# Patient Record
Sex: Male | Born: 1950 | Race: Black or African American | Hispanic: No | Marital: Married | Smoking: Former smoker
Health system: Southern US, Community
[De-identification: ages and names within clinical notes are randomized; demographics above are authoritative.]

## PROBLEM LIST (undated history)

## (undated) ENCOUNTER — Emergency Department (HOSPITAL_COMMUNITY): Payer: Medicare Other

## (undated) DIAGNOSIS — E7800 Pure hypercholesterolemia, unspecified: Secondary | ICD-10-CM

## (undated) DIAGNOSIS — I639 Cerebral infarction, unspecified: Secondary | ICD-10-CM

## (undated) DIAGNOSIS — I5020 Unspecified systolic (congestive) heart failure: Secondary | ICD-10-CM

## (undated) DIAGNOSIS — D649 Anemia, unspecified: Secondary | ICD-10-CM

## (undated) DIAGNOSIS — G4733 Obstructive sleep apnea (adult) (pediatric): Secondary | ICD-10-CM

## (undated) DIAGNOSIS — N186 End stage renal disease: Secondary | ICD-10-CM

## (undated) DIAGNOSIS — Z9989 Dependence on other enabling machines and devices: Secondary | ICD-10-CM

## (undated) DIAGNOSIS — I10 Essential (primary) hypertension: Secondary | ICD-10-CM

## (undated) DIAGNOSIS — T07XXXA Unspecified multiple injuries, initial encounter: Secondary | ICD-10-CM

## (undated) DIAGNOSIS — D352 Benign neoplasm of pituitary gland: Secondary | ICD-10-CM

## (undated) DIAGNOSIS — E119 Type 2 diabetes mellitus without complications: Secondary | ICD-10-CM

## (undated) HISTORY — PX: COLONOSCOPY: SHX174

---

## 2000-03-24 ENCOUNTER — Encounter: Admission: RE | Admit: 2000-03-24 | Discharge: 2000-06-22 | Payer: Self-pay | Admitting: Internal Medicine

## 2002-06-02 ENCOUNTER — Ambulatory Visit (HOSPITAL_COMMUNITY): Admission: RE | Admit: 2002-06-02 | Discharge: 2002-06-02 | Payer: Self-pay | Admitting: Gastroenterology

## 2002-06-02 ENCOUNTER — Encounter (INDEPENDENT_AMBULATORY_CARE_PROVIDER_SITE_OTHER): Payer: Self-pay | Admitting: *Deleted

## 2003-05-21 ENCOUNTER — Ambulatory Visit (HOSPITAL_BASED_OUTPATIENT_CLINIC_OR_DEPARTMENT_OTHER): Admission: RE | Admit: 2003-05-21 | Discharge: 2003-05-21 | Payer: Self-pay | Admitting: Nephrology

## 2010-04-12 ENCOUNTER — Encounter: Admission: RE | Admit: 2010-04-12 | Discharge: 2010-04-12 | Payer: Self-pay | Admitting: Internal Medicine

## 2010-11-02 NOTE — Op Note (Signed)
   NAMEJADIEL, Blake Bell                       ACCOUNT NO.:  [id number]   MEDICAL RECORD NO.:  [id number]                   PATIENT TYPE:  AMB   LOCATION:  ENDO                                 FACILITY:  [facility]   PHYSICIAN:  Anselmo Rod, M.D.               DATE OF BIRTH:  1950-07-14   DATE OF PROCEDURE:  06/02/2002  DATE OF DISCHARGE:                                 OPERATIVE REPORT   PROCEDURE:  Esophagogastroduodenoscopy with biopsies.   ENDOSCOPIST:  Anselmo Rod, M.D.   INSTRUMENT USED:  Olympus video panendoscope.   INDICATION FOR PROCEDURE:  A 60 year old African-American male with a  history of iron-deficiency anemia and hemoglobin down to 10.6 g/dl.  Rule  out peptic ulcer disease, esophagitis, gastritis, etc.   PREPROCEDURE PREPARATION:  Informed consent was procured from the patient.  The patient was fasted for eight hours prior to the procedure.   PREPROCEDURE PHYSICAL:  VITAL SIGNS:  The patient had stable vital signs.  NECK:  Supple.  CHEST:  Clear to auscultation.  S1, S2 regular.  ABDOMEN:  Soft with normal bowel sounds.   DESCRIPTION OF PROCEDURE:  The patient was placed in the left lateral  decubitus position and sedated with 80 mg of Demerol and 8 mg of Versed  intravenously.  Once the patient was adequately sedate and maintained on low-  flow oxygen and continuous cardiac monitoring, the Olympus video  panendoscope was advanced through the mouthpiece, over the tongue, into the  esophagus under direct vision.  The entire esophagus appeared normal with no  evidence of ring, stricture, masses, esophagitis, or Barrett's mucosa.  The  scope was then advanced into the stomach.  A prominent fold was seen in the  antrum and was biopsied for pathology.  Two biopsies were done and the area  bled easily, and therefore no further biopsies were taken.  The rest of the  gastric mucosa seemed healthy and without lesions.  The proximal small bowel  appeared normal  as well.   IMPRESSION:  1. A prominent fold in the antrum, biopsied for pathology.  2. Otherwise normal-appearing esophagus and proximal small bowel.    RECOMMENDATIONS:  1. Await pathology results.  2. Avoid all nonsteroidals, including aspirin, for now.  3. Proceed with a colonoscopy at this time.                                               Anselmo Rod, M.D.    JNM/MEDQ  D:  06/02/2002  T:  06/03/2002  Job:  [id number]   cc:   Merlene Laughter. Renae Gloss, M.D.  [phone]  [place]  [place] [address]  Fax: [phone]

## 2010-11-02 NOTE — Op Note (Signed)
NAMEBRYTEN, Blake Bell                       ACCOUNT NO.:  [id number]   MEDICAL RECORD NO.:  [id number]                   PATIENT TYPE:  AMB   LOCATION:  ENDO                                 FACILITY:  [facility]   PHYSICIAN:  Anselmo Rod, M.D.               DATE OF BIRTH:  09/07/1950   DATE OF PROCEDURE:  06/03/2002  DATE OF DISCHARGE:                                 OPERATIVE REPORT   PROCEDURE:  Colonoscopy with biopsies.   ENDOSCOPIST:  Anselmo Rod, M.D.   INSTRUMENT USED:  Olympus video colonoscope.   INDICATION FOR PROCEDURE:  A 60 year old African-American male with a  history of iron-deficiency anemia.  Rule out colonic polyps, masses, etc.   PREPROCEDURE PREPARATION:  Informed consent was procured from the patient.  The patient was fasted for eight hours prior to the procedure and prepped  with a bottle of magnesium citrate and a gallon of NuLytely the night prior  to the procedure.   PREPROCEDURE PHYSICAL:  VITAL SIGNS:  The patient had stable vital signs.  NECK:  Supple.  CHEST:  Clear to auscultation.  S1, S2 regular.  ABDOMEN:  Soft with normal bowel sounds.   DESCRIPTION OF PROCEDURE:  The patient was placed in the left lateral  decubitus position and sedated with Demerol and Versed for the EGD.  No  additional sedation was used for the colonoscopy.  Once the patient was  adequately sedate and maintained on low-flow oxygen and continuous cardiac  monitoring, the Olympus video colonoscope was advanced from the rectum to  the cecum and terminal ileum with difficulty.  There was some residual stool  in the right colon.  Multiple washes were done.  Patchy erosions were seen  in the cecum.  These were biopsied for pathology.  Similar erosions were  also noted in the proximal right colon.  Biopsies were done to rule out IBD.  The rest of the colonic mucosa appeared healthy and without lesions.  Retroflexion revealed no abnormalities.  The patient tolerated the  procedure  well without complications.   IMPRESSION:  Patchy erosions in cecum and proximal right colon, biopsied to  rule out inflammatory bowel disease; otherwise normal colonoscopy, including  the terminal ileum.   RECOMMENDATIONS:  1. Await pathology results.  2.     Avoid all nonsteroidals, including aspirin, for now.  3. Check CBC today.  4. Outpatient follow-up in the next two weeks for further recommendations.                                               Anselmo Rod, M.D.    JNM/MEDQ  D:  06/03/2002  T:  06/04/2002  Job:  [id number]   cc:   Merlene Laughter. Renae Gloss, M.D.  [phone] [address]  [place]  [place] [address]  Fax: [phone]

## 2012-02-28 ENCOUNTER — Other Ambulatory Visit: Payer: Self-pay | Admitting: Gastroenterology

## 2012-03-02 ENCOUNTER — Ambulatory Visit (HOSPITAL_COMMUNITY)
Admission: RE | Admit: 2012-03-02 | Payer: BC Managed Care – PPO | Source: Ambulatory Visit | Admitting: Gastroenterology

## 2012-03-02 ENCOUNTER — Encounter (HOSPITAL_COMMUNITY): Admission: RE | Payer: Self-pay | Source: Ambulatory Visit

## 2012-03-02 SURGERY — COLONOSCOPY
Anesthesia: Moderate Sedation

## 2013-01-26 ENCOUNTER — Other Ambulatory Visit: Payer: Self-pay | Admitting: Gastroenterology

## 2013-03-10 ENCOUNTER — Encounter (HOSPITAL_COMMUNITY): Payer: Self-pay | Admitting: *Deleted

## 2013-03-17 ENCOUNTER — Encounter (HOSPITAL_COMMUNITY): Payer: Self-pay | Admitting: Pharmacy Technician

## 2013-04-05 ENCOUNTER — Encounter (HOSPITAL_COMMUNITY): Payer: Self-pay | Admitting: *Deleted

## 2013-04-05 ENCOUNTER — Encounter (HOSPITAL_COMMUNITY)
Admission: RE | Disposition: A | Discharge status: Home / Self Care | Payer: Self-pay | Source: Ambulatory Visit | Attending: Gastroenterology

## 2013-04-05 ENCOUNTER — Encounter (HOSPITAL_COMMUNITY): Payer: BC Managed Care – PPO | Admitting: Anesthesiology

## 2013-04-05 ENCOUNTER — Ambulatory Visit (HOSPITAL_COMMUNITY)
Admission: RE | Admit: 2013-04-05 | Discharge: 2013-04-05 | Disposition: A | Discharge status: Home / Self Care | Payer: BC Managed Care – PPO | Source: Ambulatory Visit | Attending: Gastroenterology | Admitting: Gastroenterology

## 2013-04-05 ENCOUNTER — Ambulatory Visit (HOSPITAL_COMMUNITY): Payer: BC Managed Care – PPO | Admitting: Anesthesiology

## 2013-04-05 DIAGNOSIS — K648 Other hemorrhoids: Secondary | ICD-10-CM | POA: Insufficient documentation

## 2013-04-05 DIAGNOSIS — G4730 Sleep apnea, unspecified: Secondary | ICD-10-CM | POA: Insufficient documentation

## 2013-04-05 DIAGNOSIS — R0602 Shortness of breath: Secondary | ICD-10-CM | POA: Insufficient documentation

## 2013-04-05 DIAGNOSIS — L299 Pruritus, unspecified: Secondary | ICD-10-CM | POA: Insufficient documentation

## 2013-04-05 DIAGNOSIS — K5730 Diverticulosis of large intestine without perforation or abscess without bleeding: Secondary | ICD-10-CM | POA: Insufficient documentation

## 2013-04-05 DIAGNOSIS — I129 Hypertensive chronic kidney disease with stage 1 through stage 4 chronic kidney disease, or unspecified chronic kidney disease: Secondary | ICD-10-CM | POA: Insufficient documentation

## 2013-04-05 DIAGNOSIS — R21 Rash and other nonspecific skin eruption: Secondary | ICD-10-CM | POA: Insufficient documentation

## 2013-04-05 DIAGNOSIS — M2550 Pain in unspecified joint: Secondary | ICD-10-CM | POA: Insufficient documentation

## 2013-04-05 DIAGNOSIS — N189 Chronic kidney disease, unspecified: Secondary | ICD-10-CM | POA: Insufficient documentation

## 2013-04-05 DIAGNOSIS — Z1211 Encounter for screening for malignant neoplasm of colon: Secondary | ICD-10-CM | POA: Insufficient documentation

## 2013-04-05 DIAGNOSIS — K6289 Other specified diseases of anus and rectum: Secondary | ICD-10-CM | POA: Insufficient documentation

## 2013-04-05 DIAGNOSIS — E119 Type 2 diabetes mellitus without complications: Secondary | ICD-10-CM | POA: Insufficient documentation

## 2013-04-05 DIAGNOSIS — K6389 Other specified diseases of intestine: Secondary | ICD-10-CM | POA: Insufficient documentation

## 2013-04-05 HISTORY — PX: COLONOSCOPY: SHX5424

## 2013-04-05 HISTORY — DX: Essential (primary) hypertension: I10

## 2013-04-05 LAB — GLUCOSE, CAPILLARY: Glucose-Capillary: 217 mg/dL — ABNORMAL HIGH (ref 70–99)

## 2013-04-05 SURGERY — COLONOSCOPY
Anesthesia: Monitor Anesthesia Care

## 2013-04-05 MED ORDER — LACTATED RINGERS IV SOLN
INTRAVENOUS | Status: DC | PRN
  Administered 2013-04-05: 12:00:00 via INTRAVENOUS

## 2013-04-05 MED ORDER — KETAMINE HCL 50 MG/ML IJ SOLN
INTRAMUSCULAR | Status: DC | PRN
  Administered 2013-04-05: 25 mg via INTRAMUSCULAR

## 2013-04-05 MED ORDER — SODIUM CHLORIDE 0.9 % IV SOLN: INTRAVENOUS | Status: DC

## 2013-04-05 MED ORDER — MIDAZOLAM HCL 5 MG/5ML IJ SOLN
INTRAMUSCULAR | Status: DC | PRN
  Administered 2013-04-05: 2 mg via INTRAVENOUS

## 2013-04-05 MED ORDER — PROPOFOL INFUSION 10 MG/ML OPTIME
INTRAVENOUS | Status: DC | PRN
  Administered 2013-04-05: 100 ug/kg/min via INTRAVENOUS

## 2013-04-05 NOTE — Anesthesia Postprocedure Evaluation (Signed)
  Anesthesia Post-op Note  Patient: Blake Bell  Procedure(s) Performed: Procedure(s) (LRB): COLONOSCOPY (N/A)  Patient Location: PACU  Anesthesia Type: MAC  Level of Consciousness: awake and alert   Airway and Oxygen Therapy: Patient Spontanous Breathing  Post-op Pain: mild  Post-op Assessment: Post-op Vital signs reviewed, Patient's Cardiovascular Status Stable, Respiratory Function Stable, Patent Airway and No signs of Nausea or vomiting  Last Vitals:  Filed Vitals:   04/05/13 1410  BP: 182/115  Temp:   Resp: 24    Post-op Vital Signs: stable   Complications: No apparent anesthesia complications

## 2013-04-05 NOTE — Op Note (Signed)
[facility] [address] [place], [address]   OPERATIVE PROCEDURE REPORT  PATIENT: Blake Bell, Blake Bell  MR#: [id number] BIRTHDATE: February 14, 1951 GENDER: Male ENDOSCOPIST: Dr.  Lorenza Burton, MD ASSISTANT:   Kandice Robinsons, technician Claudie Revering, RN CGRN PROCEDURE DATE: 04/05/2013 PRE-PROCEDURE PREPARATION: Patient fasted for 4 hours prior to procedure. He was prepped with a gallon of Nulytely the night prior to the procedure.  PRE-PROCEDURE PHYSICAL: Patient has stable vital signs.  Neck is supple.  There is no JVD, thyromegaly or LAD.  Chest clear to auscultation.  S1 and S2 regular.  Abdomen soft, morbidly obese, non-distended, non-tender with NABS. PROCEDURE:     Colonoscopy with cold biopsies x 2. ASA CLASS:     Class IV INDICATIONS:     1.  Colorectal cancer screening. MEDICATIONS:     Propofol 150mg  IV.  DESCRIPTION OF PROCEDURE: After the risks, benefits, and alternatives of the procedure were thoroughly explained [including a 10% missed rate of cancer and polyps], informed consent was obtained.  Digital rectal exam was performed.  The Pentax Colonoscope [id number]  was introduced through the anus  and advanced to the terminal ileum which was intubated for a short distance , limited by No adverse events experienced. he quality of the prep was good. Multiple washes were done. Small lesions could be missed. The instrument was then slowly withdrawn as the colon was fully examined.     COLON FINDINGS: Few scattered erosions were noted in the cecum. Scattered diverticula were noted in the aigmoid colon. The rest of the colonic mucosa appeared healthy with a normal vascular pattern. No masses, polyps, diverticula or AVMs were noted.  The appendiceal orifice and the ICV were identified and photographed. The terminal ileum appeared normal.  Retroflexed views revealed small internal hemorrhoids. The patient tolerated the procedure without  immediate complications.  The scope was then withdrawn from the patient and the procedure terminated.  TIME TO CECUM:   5 minutes 00 seconds WITHDRAW TIME:  6 minutes 00 seconds  IMPRESSION:     1.  Scattered erosions in the cecum-biopsied for pathology. 2.  Scattered sigmoid diverticulosis. 3. Small internal hemorrhoids. 4. Otherwise, normal colonoscopy upto the terminal ileum.    RECOMMENDATIONS:     1.  Hold all NSAIDS for 2 weeks. 2.  Await biopsy results 3.  Continue current medications. 4.  High fiber diet with liberal fluid intake. 5.  OP follow-up is advised on a PRN basis.   REPEAT EXAM:      In 10 yearss  for colonoscopy.  If the patient has any abnormal GI symptoms in the interim, she/he have been advised to contact the office as soon as possible for further recommendations.   CPT CODES:     [id number], Colonoscopy with Biopsy  DIAGNOSIS CODES:     V76.51, 562.10   REFERRED BY:Kim Renae Gloss, M.D.  eSigned:  Dr. Lorenza Burton, MD 04/05/2013 1:57 PM   PATIENT NAME:  Blake Bell, Blake Bell MR#: [id number]

## 2013-04-05 NOTE — Transfer of Care (Signed)
Immediate Anesthesia Transfer of Care Note  Patient: Blake Bell  Procedure(s) Performed: Procedure(s): COLONOSCOPY (N/A)  Patient Location: PACU  Anesthesia Type:MAC  Level of Consciousness: sedated  Airway & Oxygen Therapy: Patient Spontanous Breathing and Patient connected to nasal cannula oxygen  Post-op Assessment: Report given to PACU RN and Post -op Vital signs reviewed and stable  Post vital signs: Reviewed and stable  Complications: No apparent anesthesia complications

## 2013-04-05 NOTE — Anesthesia Preprocedure Evaluation (Signed)
Anesthesia Evaluation  Patient identified by MRN, date of birth, ID band Patient awake    Reviewed: Allergy & Precautions, H&P , NPO status , Patient's Chart, lab work & pertinent test results  Airway Mallampati: III TM Distance: >3 FB Neck ROM: Full    Dental no notable dental hx.    Pulmonary neg pulmonary ROS, sleep apnea and Continuous Positive Airway Pressure Ventilation ,  breath sounds clear to auscultation  Pulmonary exam normal       Cardiovascular hypertension, Pt. on medications negative cardio ROS  Rhythm:Regular Rate:Normal     Neuro/Psych negative neurological ROS  negative psych ROS   GI/Hepatic negative GI ROS, Neg liver ROS,   Endo/Other  negative endocrine ROSdiabetes, Type 2, Oral Hypoglycemic AgentsMorbid obesity  Renal/GU negative Renal ROS  negative genitourinary   Musculoskeletal negative musculoskeletal ROS (+)   Abdominal   Peds negative pediatric ROS (+)  Hematology negative hematology ROS (+)   Anesthesia Other Findings   Reproductive/Obstetrics negative OB ROS                           Anesthesia Physical Anesthesia Plan  ASA: III  Anesthesia Plan: MAC   Post-op Pain Management:    Induction:   Airway Management Planned: Simple Face Mask  Additional Equipment:   Intra-op Plan:   Post-operative Plan: Extubation in OR  Informed Consent: I have reviewed the patients History and Physical, chart, labs and discussed the procedure including the risks, benefits and alternatives for the proposed anesthesia with the patient or authorized representative who has indicated his/her understanding and acceptance.   Dental advisory given  Plan Discussed with: CRNA  Anesthesia Plan Comments:         Anesthesia Quick Evaluation

## 2013-04-05 NOTE — H&P (Signed)
Blake Bell is an 62 y.o. male.   Chief Complaint: Colorectal cancer screening. HPI: 62 year old black male, here for colorectal cancer screening. He is being done at the hospital as he has a history of sleep apnea and uses a C-Pap at home and has a BMI of 58.18. Refer to office notes for details.   Past Medical History  Diagnosis Date  . Hypertension   . Diabetes mellitus without complication   . Sleep apnea     uses cpap, setting of 5  . Chronic kidney disease     ssees dr Lowell Guitar nephrology every 6-9 months   Past Surgical History  Procedure Laterality Date  . Colonscopy  6-7 yrs ago   History reviewed. No pertinent family history. Social History:  reports that he quit smoking about 42 years ago. His smoking use included Cigarettes. He has a .5 pack-year smoking history. He has never used smokeless tobacco. He reports that he uses illicit drugs (Marijuana). He reports that he does not drink alcohol.  Allergies: No Known Allergies  Medications Prior to Admission  Medication Sig Dispense Refill  . enalapril (VASOTEC) 20 MG tablet Take 20 mg by mouth every evening.      . furosemide (LASIX) 80 MG tablet Take 80 mg by mouth 2 (two) times daily.      Marland Kitchen glyBURIDE-metformin (GLUCOVANCE) 2.5-500 MG per tablet Take 1 tablet by mouth 2 (two) times daily with a meal.      . linagliptin (TRADJENTA) 5 MG TABS tablet Take 5 mg by mouth daily.      Marland Kitchen losartan (COZAAR) 100 MG tablet Take 100 mg by mouth 2 (two) times daily.      . penicillin v potassium (VEETID) 500 MG tablet Take 500 mg by mouth 2 (two) times daily. continuous      . potassium chloride SA (K-DUR,KLOR-CON) 20 MEQ tablet Take 20 mEq by mouth 2 (two) times daily.       Results for orders placed during the hospital encounter of 04/05/13 (from the past 48 hour(s))  GLUCOSE, CAPILLARY     Status: Abnormal   Collection Time    04/05/13 12:21 PM      Result Value Range   Glucose-Capillary 217 (*) 70 - 99 mg/dL   No results  found.  Review of Systems  Constitutional: Negative.   HENT: Negative.   Respiratory: Positive for shortness of breath.   Gastrointestinal: Negative.   Genitourinary: Negative.   Musculoskeletal: Positive for joint pain.  Skin: Positive for itching and rash.  Neurological: Negative.   Psychiatric/Behavioral: Negative.    Blood pressure 179/117, temperature 98.3 F (36.8 C), temperature source Oral, resp. rate 19, height 5' 9.5" (1.765 m), weight 177.81 kg (392 lb), SpO2 97.00%. Physical Exam  Nursing note and vitals reviewed. Constitutional: He appears well-developed and well-nourished.  HENT:  Head: Normocephalic and atraumatic.  Eyes: Conjunctivae and EOM are normal. Pupils are equal, round, and reactive to light.  Neck: Normal range of motion. Neck supple.  Cardiovascular: Normal rate and regular rhythm.   Respiratory: Effort normal and breath sounds normal.  GI: Soft. Bowel sounds are normal. There is no hepatosplenomegaly. There is no tenderness. There is no rebound and no CVA tenderness. No hernia. Hernia confirmed negative in the ventral area.  Morbidly obese  Skin: Skin is warm and dry. There is erythema.  Peripheral edema with erythema of the lower limbs    Assessment/Plan Colorectal cancer screening: Proceed with a colonoscopy at this time.  Tarik Teixeira 04/05/2013, 1:16 PM

## 2013-04-06 ENCOUNTER — Encounter (HOSPITAL_COMMUNITY): Payer: Self-pay | Admitting: Gastroenterology

## 2013-05-20 ENCOUNTER — Other Ambulatory Visit: Payer: Self-pay | Admitting: Cardiology

## 2013-05-20 DIAGNOSIS — I872 Venous insufficiency (chronic) (peripheral): Secondary | ICD-10-CM

## 2013-05-25 ENCOUNTER — Ambulatory Visit (INDEPENDENT_AMBULATORY_CARE_PROVIDER_SITE_OTHER): Payer: BC Managed Care – PPO | Admitting: Pulmonary Disease

## 2013-05-25 ENCOUNTER — Encounter: Payer: Self-pay | Admitting: Pulmonary Disease

## 2013-05-25 VITALS — BP 162/110 | HR 79 | Temp 97.5°F | Ht 69.5 in | Wt 395.8 lb

## 2013-05-25 DIAGNOSIS — G4733 Obstructive sleep apnea (adult) (pediatric): Secondary | ICD-10-CM | POA: Insufficient documentation

## 2013-05-25 NOTE — Patient Instructions (Signed)
Will get you a new machine, and use the automatic setting to optimize your pressure again.  Will call you with results. Work on weight loss followup with me in 6mos, and if doing well, will see you back yearly.

## 2013-05-25 NOTE — Progress Notes (Signed)
Subjective:    Patient ID: Blake Bell, male    DOB: Oct 27, 1950, 62 y.o.   MRN: [id number]  HPI The patient is a 62 year old male who I been asked to see for management of obstructive sleep apnea. He tells me that he was diagnosed in 2004, and has been on CPAP since that time. He is using his same machine, and is also using a nasal mask without significant mouth opening. Despite wearing this, he often awakens feeling that he does not have enough pressure. He is sometimes rested in the mornings and other times not, but does note definite daytime sleepiness with inactivity. He tells me that he will fall asleep at the movies, unless he is eating popcorn and soda. The patient states that his weight is up over 50 pounds over the last 3 years, and his Epworth score today is 13.   Sleep Questionnaire What time do you typically go to bed?( Between what hours) 1030 1030 at 0924 on 05/25/13 by Maisie Fus, CMA How long does it take you to fall asleep? w/in mins w/in mins at 0924 on 05/25/13 by Maisie Fus, CMA How many times during the night do you wake up? 4 4 at 0924 on 05/25/13 by Maisie Fus, CMA What time do you get out of bed to start your day? 0800 0800 at 0924 on 05/25/13 by Maisie Fus, CMA Do you drive or operate heavy machinery in your occupation? No No at 0924 on 05/25/13 by Maisie Fus, CMA How much has your weight changed (up or down) over the past two years? (In pounds) 7 lb (3.175 kg)7 lb (3.175 kg) decrease at 0924 on 05/25/13 by Maisie Fus, CMA Have you ever had a sleep study before? Yes Yes at 0924 on 05/25/13 by Maisie Fus, CMA If yes, [facility] of [facility]? [facility] at 0924 on 05/25/13 by Maisie Fus, CMA If yes, date of study? 2004 2004 at 0924 on 05/25/13 by Maisie Fus, CMA Do you currently use CPAP? Yes Yes at 0924 on 05/25/13 by Maisie Fus, CMA If so, what pressure? unsure unsure at 0924 on 05/25/13 by Maisie Fus, CMA  Do you wear oxygen at any time? No No at 0924 on 05/25/13 by Maisie Fus, CMA   Review of Systems  Constitutional: Negative for fever and unexpected weight change.  HENT: Positive for congestion, sinus pressure and sneezing. Negative for dental problem, ear pain, nosebleeds, postnasal drip, rhinorrhea, sore throat and trouble swallowing.   Eyes: Positive for itching. Negative for redness.  Respiratory: Positive for cough, shortness of breath and wheezing. Negative for chest tightness.   Cardiovascular: Positive for leg swelling. Negative for palpitations.  Gastrointestinal: Negative for nausea and vomiting.  Genitourinary: Negative for dysuria.  Musculoskeletal: Negative for joint swelling.  Skin: Negative for rash.  Neurological: Negative for headaches.  Hematological: Does not bruise/bleed easily.  Psychiatric/Behavioral: Positive for dysphoric mood. The patient is nervous/anxious.        Objective:   Physical Exam Constitutional:  Morbidly obese male, no acute distress  HENT:  Nares patent without discharge  Oropharynx without exudate, palate and uvula are moderately elongated.  Eyes:  Perrla, eomi, no scleral icterus  Neck:  No JVD, no TMG  Cardiovascular:  Normal rate, regular rhythm, no rubs or gallops.  2/6 sem        Distal pulses difficult to palpate.  Pulmonary :  Normal breath sounds, no stridor  or respiratory distress   No rales, rhonchi, or wheezing  Abdominal:  Soft, nondistended, bowel sounds present.  No tenderness noted.   Musculoskeletal:  3+ lower extremity edema noted.  Lymph Nodes:  No cervical lymphadenopathy noted  Skin:  No cyanosis noted  Neurologic:  Alert, appropriate, moves all 4 extremities without obvious deficit.         Assessment & Plan:

## 2013-05-25 NOTE — Assessment & Plan Note (Signed)
The patient has a history of obstructive sleep apnea, but his sleep study from the distant past is not available currently. The patient tells me that he is using a machine that is 62 years old, and this will obviously need to be replaced. He has also gained significant weight and is having breakthrough symptoms, and therefore we need to optimize his pressure again. Finally, he is overdue for a new mask and supplies. I will get all of this set up with his home care company, and have also encouraged him to work aggressively on weight loss.

## 2013-06-07 ENCOUNTER — Encounter (HOSPITAL_BASED_OUTPATIENT_CLINIC_OR_DEPARTMENT_OTHER): Payer: BC Managed Care – PPO | Attending: General Surgery

## 2013-06-07 DIAGNOSIS — Z79899 Other long term (current) drug therapy: Secondary | ICD-10-CM | POA: Insufficient documentation

## 2013-06-07 DIAGNOSIS — J449 Chronic obstructive pulmonary disease, unspecified: Secondary | ICD-10-CM | POA: Insufficient documentation

## 2013-06-07 DIAGNOSIS — L97809 Non-pressure chronic ulcer of other part of unspecified lower leg with unspecified severity: Secondary | ICD-10-CM | POA: Insufficient documentation

## 2013-06-07 DIAGNOSIS — I10 Essential (primary) hypertension: Secondary | ICD-10-CM | POA: Insufficient documentation

## 2013-06-07 DIAGNOSIS — J4489 Other specified chronic obstructive pulmonary disease: Secondary | ICD-10-CM | POA: Insufficient documentation

## 2013-06-07 DIAGNOSIS — E119 Type 2 diabetes mellitus without complications: Secondary | ICD-10-CM | POA: Insufficient documentation

## 2013-06-07 DIAGNOSIS — R609 Edema, unspecified: Secondary | ICD-10-CM | POA: Insufficient documentation

## 2013-06-07 DIAGNOSIS — Z794 Long term (current) use of insulin: Secondary | ICD-10-CM | POA: Insufficient documentation

## 2013-06-07 DIAGNOSIS — I872 Venous insufficiency (chronic) (peripheral): Secondary | ICD-10-CM | POA: Insufficient documentation

## 2013-06-07 LAB — GLUCOSE, CAPILLARY: Glucose-Capillary: 355 mg/dL — ABNORMAL HIGH (ref 70–99)

## 2013-06-08 NOTE — Progress Notes (Signed)
[facility]  NAME:  Blake Bell, Blake Bell NO.:  [id number]  MEDICAL RECORD NO.:  [id number]      DATE OF BIRTH:  [birth year]  PHYSICIAN:  Ardath Sax, M.D.           VISIT DATE:                                  OFFICE VISIT   HISTORY OF PRESENT ILLNESS:  This is the first visit to the [facility] for [name].  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  He was sent here by his family doctor because of bilateral venous stasis ulcers.  These have been present for several months.  He has been treated by his doctor with Unna boots without much success.  He also has a history of type 2 diabetes, COPD, hypertension. He takes many medicines including enalapril, Lasix 80 mg a day, Glucovance 5 mg twice a day, Tradjenta, losartan, potassium chloride, spironolactone, Coreg, and amlodipine.  He weighs 390 pounds and today we found him to have a blood sugar of 355, blood pressure of 203/111, temperature 97.  We examined him and found him to have 2 significant venous stasis ulcers with a lot of edema in his calves and his feet. The venous ulcerations are in both lateral lower legs and are 3 to 4 cm in diameter.  We are going to treat him with Unna boots with silver alginate on these wounds.  I told him to elevate his legs as much as he could to get his sugar down and to not have any intake of salt.  I am going to apply for Apligraf as I am sure he is going to need great measures to get these chronic venous ulcers healed.  DIAGNOSES: 1. Chronic venous ulcers bilateral legs. 2. Morbid obesity. 3. Type 2 diabetes. 4. Hypertension. 5. Chronic obstructive pulmonary disease.     Ardath Sax, M.D.     PP/MEDQ  D:  06/07/2013  T:  06/08/2013  Job:  [id number]

## 2013-06-21 ENCOUNTER — Emergency Department (HOSPITAL_COMMUNITY): Payer: BC Managed Care – PPO

## 2013-06-21 ENCOUNTER — Encounter (HOSPITAL_COMMUNITY): Payer: Self-pay | Admitting: Emergency Medicine

## 2013-06-21 ENCOUNTER — Inpatient Hospital Stay (HOSPITAL_COMMUNITY)
Admission: EM | Admit: 2013-06-21 | Discharge: 2013-07-13 | DRG: 637 | Disposition: A | Discharge status: Skilled Nursing Facility | Payer: BC Managed Care – PPO | Attending: Internal Medicine | Admitting: Internal Medicine

## 2013-06-21 DIAGNOSIS — Z781 Physical restraint status: Secondary | ICD-10-CM | POA: Diagnosis not present

## 2013-06-21 DIAGNOSIS — R4182 Altered mental status, unspecified: Secondary | ICD-10-CM

## 2013-06-21 DIAGNOSIS — E662 Morbid (severe) obesity with alveolar hypoventilation: Secondary | ICD-10-CM | POA: Diagnosis present

## 2013-06-21 DIAGNOSIS — R220 Localized swelling, mass and lump, head: Secondary | ICD-10-CM

## 2013-06-21 DIAGNOSIS — R569 Unspecified convulsions: Secondary | ICD-10-CM

## 2013-06-21 DIAGNOSIS — Z79899 Other long term (current) drug therapy: Secondary | ICD-10-CM

## 2013-06-21 DIAGNOSIS — E1110 Type 2 diabetes mellitus with ketoacidosis without coma: Secondary | ICD-10-CM

## 2013-06-21 DIAGNOSIS — G9389 Other specified disorders of brain: Secondary | ICD-10-CM

## 2013-06-21 DIAGNOSIS — E1049 Type 1 diabetes mellitus with other diabetic neurological complication: Secondary | ICD-10-CM | POA: Diagnosis present

## 2013-06-21 DIAGNOSIS — I872 Venous insufficiency (chronic) (peripheral): Secondary | ICD-10-CM | POA: Diagnosis present

## 2013-06-21 DIAGNOSIS — I674 Hypertensive encephalopathy: Secondary | ICD-10-CM | POA: Diagnosis present

## 2013-06-21 DIAGNOSIS — R0689 Other abnormalities of breathing: Secondary | ICD-10-CM

## 2013-06-21 DIAGNOSIS — N390 Urinary tract infection, site not specified: Secondary | ICD-10-CM | POA: Diagnosis not present

## 2013-06-21 DIAGNOSIS — N179 Acute kidney failure, unspecified: Secondary | ICD-10-CM

## 2013-06-21 DIAGNOSIS — D352 Benign neoplasm of pituitary gland: Secondary | ICD-10-CM | POA: Diagnosis present

## 2013-06-21 DIAGNOSIS — E870 Hyperosmolality and hypernatremia: Secondary | ICD-10-CM

## 2013-06-21 DIAGNOSIS — N183 Chronic kidney disease, stage 3 unspecified: Secondary | ICD-10-CM

## 2013-06-21 DIAGNOSIS — G4733 Obstructive sleep apnea (adult) (pediatric): Secondary | ICD-10-CM

## 2013-06-21 DIAGNOSIS — E119 Type 2 diabetes mellitus without complications: Secondary | ICD-10-CM

## 2013-06-21 DIAGNOSIS — B964 Proteus (mirabilis) (morganii) as the cause of diseases classified elsewhere: Secondary | ICD-10-CM | POA: Diagnosis present

## 2013-06-21 DIAGNOSIS — L97909 Non-pressure chronic ulcer of unspecified part of unspecified lower leg with unspecified severity: Secondary | ICD-10-CM | POA: Diagnosis present

## 2013-06-21 DIAGNOSIS — E876 Hypokalemia: Secondary | ICD-10-CM | POA: Diagnosis present

## 2013-06-21 DIAGNOSIS — E1010 Type 1 diabetes mellitus with ketoacidosis without coma: Principal | ICD-10-CM | POA: Diagnosis present

## 2013-06-21 DIAGNOSIS — E1065 Type 1 diabetes mellitus with hyperglycemia: Secondary | ICD-10-CM | POA: Diagnosis present

## 2013-06-21 DIAGNOSIS — D353 Benign neoplasm of craniopharyngeal duct: Secondary | ICD-10-CM

## 2013-06-21 DIAGNOSIS — Z6841 Body Mass Index (BMI) 40.0 and over, adult: Secondary | ICD-10-CM

## 2013-06-21 DIAGNOSIS — I83009 Varicose veins of unspecified lower extremity with ulcer of unspecified site: Secondary | ICD-10-CM

## 2013-06-21 DIAGNOSIS — J811 Chronic pulmonary edema: Secondary | ICD-10-CM

## 2013-06-21 DIAGNOSIS — I129 Hypertensive chronic kidney disease with stage 1 through stage 4 chronic kidney disease, or unspecified chronic kidney disease: Secondary | ICD-10-CM | POA: Diagnosis present

## 2013-06-21 DIAGNOSIS — I10 Essential (primary) hypertension: Secondary | ICD-10-CM

## 2013-06-21 DIAGNOSIS — Z87891 Personal history of nicotine dependence: Secondary | ICD-10-CM

## 2013-06-21 DIAGNOSIS — N184 Chronic kidney disease, stage 4 (severe): Secondary | ICD-10-CM

## 2013-06-21 DIAGNOSIS — J9600 Acute respiratory failure, unspecified whether with hypoxia or hypercapnia: Secondary | ICD-10-CM

## 2013-06-21 HISTORY — DX: Unspecified multiple injuries, initial encounter: T07.XXXA

## 2013-06-21 LAB — CBC WITH DIFFERENTIAL/PLATELET
Basophils Absolute: 0.1 10*3/uL (ref 0.0–0.1)
Basophils Relative: 1 % (ref 0–1)
Eosinophils Absolute: 0.3 10*3/uL (ref 0.0–0.7)
Eosinophils Relative: 2 % (ref 0–5)
HEMATOCRIT: 44.1 % (ref 39.0–52.0)
Hemoglobin: 14.2 g/dL (ref 13.0–17.0)
LYMPHS PCT: 29 % (ref 12–46)
Lymphs Abs: 3.6 10*3/uL (ref 0.7–4.0)
MCH: 29.1 pg (ref 26.0–34.0)
MCHC: 32.2 g/dL (ref 30.0–36.0)
MCV: 90.4 fL (ref 78.0–100.0)
MONO ABS: 0.8 10*3/uL (ref 0.1–1.0)
Monocytes Relative: 6 % (ref 3–12)
NEUTROS ABS: 7.6 10*3/uL (ref 1.7–7.7)
Neutrophils Relative %: 61 % (ref 43–77)
Platelets: 440 10*3/uL — ABNORMAL HIGH (ref 150–400)
RBC: 4.88 MIL/uL (ref 4.22–5.81)
RDW: 16.4 % — ABNORMAL HIGH (ref 11.5–15.5)
WBC: 12.3 10*3/uL — AB (ref 4.0–10.5)

## 2013-06-21 LAB — COMPREHENSIVE METABOLIC PANEL
ALT: 42 U/L (ref 0–53)
AST: 56 U/L — ABNORMAL HIGH (ref 0–37)
Albumin: 2.8 g/dL — ABNORMAL LOW (ref 3.5–5.2)
Alkaline Phosphatase: 161 U/L — ABNORMAL HIGH (ref 39–117)
BUN: 25 mg/dL — AB (ref 6–23)
CHLORIDE: 97 meq/L (ref 96–112)
CO2: 26 meq/L (ref 19–32)
CREATININE: 1.86 mg/dL — AB (ref 0.50–1.35)
Calcium: 9.7 mg/dL (ref 8.4–10.5)
GFR calc Af Amer: 43 mL/min — ABNORMAL LOW (ref >90)
GFR, EST NON AFRICAN AMERICAN: 37 mL/min — AB (ref >90)
Glucose, Bld: 508 mg/dL — ABNORMAL HIGH (ref 70–99)
Potassium: 2.8 mEq/L — CL (ref 3.7–5.3)
Sodium: 144 mEq/L (ref 137–147)
Total Protein: 7.9 g/dL (ref 6.0–8.3)

## 2013-06-21 LAB — BLOOD GAS, ARTERIAL
ACID-BASE EXCESS: 1.1 mmol/L (ref 0.0–2.0)
Acid-base deficit: 1.7 mmol/L (ref 0.0–2.0)
BICARBONATE: 26.1 meq/L — AB (ref 20.0–24.0)
Bicarbonate: 26.6 mEq/L — ABNORMAL HIGH (ref 20.0–24.0)
Drawn by: 235321
Drawn by: 235321
FIO2: 1 %
FIO2: 0.4 %
LHR: 18 {breaths}/min
MECHVT: 620 mL
MECHVT: 620 mL
O2 Saturation: 99.2 %
O2 Saturation: 95.7 %
PATIENT TEMPERATURE: 98.6
PCO2 ART: 59.8 mmHg — AB (ref 35.0–45.0)
PEEP: 5 cmH2O
PEEP: 5 cmH2O
PO2 ART: 328 mmHg — AB (ref 80.0–100.0)
PO2 ART: 85.6 mmHg (ref 80.0–100.0)
Patient temperature: 98.6
RATE: 14 resp/min
TCO2: 23.9 mmol/L (ref 0–100)
TCO2: 23.8 mmol/L (ref 0–100)
pCO2 arterial: 48.1 mmHg — ABNORMAL HIGH (ref 35.0–45.0)
pH, Arterial: 7.262 — ABNORMAL LOW (ref 7.350–7.450)
pH, Arterial: 7.362 (ref 7.350–7.450)

## 2013-06-21 LAB — PROTIME-INR
INR: 0.91 (ref 0.00–1.49)
PROTHROMBIN TIME: 12.1 s (ref 11.6–15.2)

## 2013-06-21 LAB — GLUCOSE, CAPILLARY
Glucose-Capillary: 520 mg/dL — ABNORMAL HIGH (ref 70–99)
Glucose-Capillary: 450 mg/dL — ABNORMAL HIGH (ref 70–99)

## 2013-06-21 LAB — CG4 I-STAT (LACTIC ACID): LACTIC ACID, VENOUS: 1.39 mmol/L (ref 0.5–2.2)

## 2013-06-21 LAB — TROPONIN I

## 2013-06-21 MED ORDER — SODIUM CHLORIDE 0.9 % IV SOLN
INTRAVENOUS | Status: DC
  Filled 2013-06-21: qty 1

## 2013-06-21 MED ORDER — ETOMIDATE 2 MG/ML IV SOLN
INTRAVENOUS | Status: AC
  Administered 2013-06-21: 20 mg
  Filled 2013-06-21: qty 20

## 2013-06-21 MED ORDER — NICARDIPINE HCL IN NACL 20-0.86 MG/200ML-% IV SOLN: 5.0000 mg/h | Freq: Once | INTRAVENOUS | Status: DC

## 2013-06-21 MED ORDER — SUCCINYLCHOLINE CHLORIDE 20 MG/ML IJ SOLN
INTRAMUSCULAR | Status: AC
  Administered 2013-06-21: 150 mg
  Filled 2013-06-21: qty 1

## 2013-06-21 MED ORDER — LIDOCAINE HCL (CARDIAC) 20 MG/ML IV SOLN
INTRAVENOUS | Status: AC
  Filled 2013-06-21: qty 5

## 2013-06-21 MED ORDER — SODIUM CHLORIDE 0.9 % IV BOLUS (SEPSIS)
1000.0000 mL | INTRAVENOUS | Status: AC
  Administered 2013-06-21: 1000 mL via INTRAVENOUS

## 2013-06-21 MED ORDER — LORAZEPAM 2 MG/ML IJ SOLN
1.0000 mg | Freq: Once | INTRAMUSCULAR | Status: AC
  Administered 2013-06-21: 1 mg via INTRAVENOUS
  Filled 2013-06-21: qty 1

## 2013-06-21 MED ORDER — LORAZEPAM 2 MG/ML IJ SOLN
INTRAMUSCULAR | Status: AC
  Administered 2013-06-21: 1 mg
  Filled 2013-06-21: qty 1

## 2013-06-21 MED ORDER — ETOMIDATE 2 MG/ML IV SOLN: 10.0000 mg | Freq: Once | INTRAVENOUS | Status: DC

## 2013-06-21 MED ORDER — POTASSIUM CHLORIDE 10 MEQ/100ML IV SOLN
10.0000 meq | Freq: Once | INTRAVENOUS | Status: AC
  Administered 2013-06-22: 10 meq via INTRAVENOUS
  Filled 2013-06-21: qty 100

## 2013-06-21 MED ORDER — SODIUM CHLORIDE 0.9 % IV SOLN
1000.0000 mg | Freq: Once | INTRAVENOUS | Status: AC
  Administered 2013-06-21: 1000 mg via INTRAVENOUS
  Filled 2013-06-21: qty 10

## 2013-06-21 MED ORDER — LORAZEPAM 2 MG/ML IJ SOLN
1.0000 mg | Freq: Once | INTRAMUSCULAR | Status: AC
  Administered 2013-06-21: 1 mg via INTRAVENOUS

## 2013-06-21 MED ORDER — IOHEXOL 350 MG/ML SOLN
100.0000 mL | Freq: Once | INTRAVENOUS | Status: AC | PRN
  Administered 2013-06-21: 100 mL via INTRAVENOUS

## 2013-06-21 MED ORDER — PROPOFOL 10 MG/ML IV EMUL
5.0000 ug/kg/min | INTRAVENOUS | Status: DC
  Administered 2013-06-21: 10 ug/kg/min via INTRAVENOUS
  Administered 2013-06-21: 20 ug/kg/min via INTRAVENOUS
  Administered 2013-06-22 (×2): 40 ug/kg/min via INTRAVENOUS
  Administered 2013-06-22: 30 ug/kg/min via INTRAVENOUS
  Administered 2013-06-22: 40 ug/kg/min via INTRAVENOUS
  Administered 2013-06-22 (×2): 30 ug/kg/min via INTRAVENOUS
  Administered 2013-06-22: 20 ug/kg/min via INTRAVENOUS
  Administered 2013-06-23 (×2): 15 ug/kg/min via INTRAVENOUS
  Administered 2013-06-23 (×2): 20 ug/kg/min via INTRAVENOUS
  Administered 2013-06-23: 15 ug/kg/min via INTRAVENOUS
  Administered 2013-06-24 (×2): 20 ug/kg/min via INTRAVENOUS
  Administered 2013-06-24 (×2): 15 ug/kg/min via INTRAVENOUS
  Administered 2013-06-24 – 2013-06-25 (×3): 20 ug/kg/min via INTRAVENOUS
  Administered 2013-06-25: 10 ug/kg/min via INTRAVENOUS
  Administered 2013-06-25 – 2013-06-27 (×7): 20 ug/kg/min via INTRAVENOUS
  Filled 2013-06-21 (×33): qty 100

## 2013-06-21 MED ORDER — DEXTROSE-NACL 5-0.45 % IV SOLN: INTRAVENOUS | Status: DC

## 2013-06-21 MED ORDER — ROCURONIUM BROMIDE 50 MG/5ML IV SOLN
INTRAVENOUS | Status: AC
  Filled 2013-06-21: qty 2

## 2013-06-21 NOTE — ED Notes (Signed)
Unsuccessfully attempted to obtain blood for labs.RN made aware 

## 2013-06-21 NOTE — ED Provider Notes (Signed)
CSN: [id number]     Arrival date & time 06/21/13  1738 History   First MD Initiated Contact with Patient 06/21/13 1748     Chief Complaint  Patient presents with  . Altered Mental Status  . Seizures   (Consider location/radiation/quality/duration/timing/severity/associated sxs/prior Treatment) Patient is a 63 y.o. male presenting with neurologic complaint. The history is provided by the EMS personnel (family).  Neurologic Problem This is a new problem. The current episode started 1 to 2 hours ago. The problem occurs constantly. The problem has not changed since onset.Pertinent negatives include no chest pain, no abdominal pain, no headaches and no shortness of breath. Nothing aggravates the symptoms. Nothing relieves the symptoms. He has tried nothing for the symptoms. The treatment provided no relief.    Past Medical History  Diagnosis Date  . Hypertension   . Diabetes mellitus without complication   . Sleep apnea     uses cpap, setting of 5  . Chronic kidney disease     ssees dr Florene Glen nephrology every 6-9 months   Past Surgical History  Procedure Laterality Date  . Colonscopy  6-7 yrs ago  . Colonoscopy N/A 04/05/2013    Procedure: COLONOSCOPY;  Surgeon: Juanita Craver, MD;  Location: [facility];  Service: Endoscopy;  Laterality: N/A;   Family History  Problem Relation Age of Onset  . Diabetes Mother   . Diabetes Father   . Diabetes Sister   . Diabetes Brother   . Hypertension Mother   . Hypertension Father    History  Substance Use Topics  . Smoking status: Former Smoker -- 0.25 packs/day for 2 years    Types: Cigarettes    Quit date: 06/17/1970  . Smokeless tobacco: Never Used  . Alcohol Use: No    Review of Systems  Constitutional: Negative for fever.  HENT: Negative for drooling and rhinorrhea.   Eyes: Negative for pain.  Respiratory: Negative for cough and shortness of breath.   Cardiovascular: Negative for chest pain and leg swelling.  Gastrointestinal:  Negative for nausea, vomiting, abdominal pain and diarrhea.  Genitourinary: Negative for dysuria and hematuria.  Musculoskeletal: Negative for gait problem and neck pain.  Skin: Negative for color change.  Neurological: Negative for numbness and headaches.  Hematological: Negative for adenopathy.  Psychiatric/Behavioral: Negative for behavioral problems.  All other systems reviewed and are negative.    Allergies  Review of patient's allergies indicates no known allergies.  Home Medications   Current Outpatient Rx  Name  Route  Sig  Dispense  Refill  . amLODipine (NORVASC) 10 MG tablet   Oral   Take 10 mg by mouth daily.         . carvedilol (COREG) 6.25 MG tablet   Oral   Take 6.25 mg by mouth 2 (two) times daily with a meal.         . enalapril (VASOTEC) 20 MG tablet   Oral   Take 20 mg by mouth every evening.         . furosemide (LASIX) 80 MG tablet   Oral   Take 80 mg by mouth daily.          Marland Kitchen glyBURIDE-metformin (GLUCOVANCE) 2.5-500 MG per tablet   Oral   Take 1 tablet by mouth 2 (two) times daily with a meal.         . losartan (COZAAR) 100 MG tablet   Oral   Take 100 mg by mouth 2 (two) times daily.         Marland Kitchen  penicillin v potassium (VEETID) 500 MG tablet   Oral   Take 500 mg by mouth 2 (two) times daily. continuous         . potassium chloride SA (K-DUR,KLOR-CON) 20 MEQ tablet   Oral   Take 20 mEq by mouth 2 (two) times daily.         Marland Kitchen spironolactone (ALDACTONE) 50 MG tablet   Oral   Take 50 mg by mouth daily.          BP 214/148  Pulse 104  Resp 34  SpO2 95% Physical Exam  Nursing note and vitals reviewed. Constitutional: He appears well-developed and well-nourished.  HENT:  Head: Normocephalic and atraumatic.  Right Ear: External ear normal.  Left Ear: External ear normal.  Nose: Nose normal.  Mouth/Throat: Oropharynx is clear and moist. No oropharyngeal exudate.  Eyes: Conjunctivae and EOM are normal. Pupils are equal,  round, and reactive to light.  Neck: Normal range of motion. Neck supple.  Cardiovascular: Normal rate, regular rhythm, normal heart sounds and intact distal pulses.  Exam reveals no gallop and no friction rub.   No murmur heard. Pulmonary/Chest: Effort normal and breath sounds normal. No respiratory distress. He has no wheezes.  Abdominal: Soft. Bowel sounds are normal. He exhibits no distension. There is no tenderness. There is no rebound and no guarding.  Musculoskeletal: Normal range of motion. He exhibits no edema and no tenderness.  Bilateral unna boots removed. Approx 2x3cm ulcer to left lateral calf. Approx 4x4 cm ulcer to right lateral calf.   Neurological: He is alert. GCS eye subscore is 2. GCS verbal subscore is 2. GCS motor subscore is 5.  Moving all extremities but will not follow any commands. Incomprehensible speech. The patient is thrashing in bed. Placed in soft wrist restraints bilaterally.  Skin: Skin is warm and dry.  Psychiatric: He has a normal mood and affect. His behavior is normal.    ED Course  INTUBATION Date/Time: 06/22/2013 10:58 AM Performed by: Pamella Pert, S Authorized by: Pamella Pert, S Consent: Verbal consent not obtained. written consent not obtained. The procedure was performed in an emergent situation. Risks and benefits: risks, benefits and alternatives were discussed Consent given by: wife. Required items: required blood products, implants, devices, and special equipment available Patient identity confirmed: arm band, provided demographic data, hospital-assigned identification number and anonymous protocol, patient vented/unresponsive Time out: Immediately prior to procedure a "time out" was called to verify the correct patient, procedure, equipment, support staff and site/side marked as required. Indications: airway protection Intubation method: video-assisted Patient status: paralyzed (RSI) Preoxygenation: BVM and nonrebreather  mask Sedatives: etomidate Paralytic: succinylcholine Laryngoscope size: Mac 4 Tube size: 8.0 mm Tube type: cuffed Number of attempts: 2 Ventilation between attempts: BVM Cricoid pressure: yes Cords visualized: yes Post-procedure assessment: chest rise,  ETCO2 monitor and CO2 detector Breath sounds: equal Cuff inflated: yes ETT to lip: 26 cm Tube secured with: ETT holder Chest x-ray interpreted by me. Chest x-ray findings: endotracheal tube too low Tube repositioned: tube repositioned successfully Patient tolerance: Patient tolerated the procedure well with no immediate complications. Comments: I initially attempted intubation with etomidate only as the pt had just gotten ativan for a seizure and I did not want to completely take away his respiratory drive due to my suspicion that this may be a difficult intubation (pt is obese, has a beard, large neck, large tongue, etc.) I initially attempted with a Mac 4 glidescope, but I don't think the pt was adequately sedated  enough and was moving some on exam. I decided to go ahead and give a paralytic (succh). I was then able to intubate him using the glidescope with a Mac 4.    (including critical care time) Labs Review Labs Reviewed  GLUCOSE, CAPILLARY - Abnormal; Notable for the following:    Glucose-Capillary 520 (*)    All other components within normal limits  CBC WITH DIFFERENTIAL - Abnormal; Notable for the following:    WBC 12.3 (*)    RDW 16.4 (*)    Platelets 440 (*)    All other components within normal limits  COMPREHENSIVE METABOLIC PANEL - Abnormal; Notable for the following:    Potassium 2.8 (*)    Glucose, Bld 508 (*)    BUN 25 (*)    Creatinine, Ser 1.86 (*)    Albumin 2.8 (*)    AST 56 (*)    Alkaline Phosphatase 161 (*)    Total Bilirubin <0.2 (*)    GFR calc non Af Amer 37 (*)    GFR calc Af Amer 43 (*)    All other components within normal limits  BLOOD GAS, ARTERIAL - Abnormal; Notable for the following:     pH, Arterial 7.262 (*)    pCO2 arterial 59.8 (*)    pO2, Arterial 328.0 (*)    Bicarbonate 26.1 (*)    All other components within normal limits  BLOOD GAS, ARTERIAL - Abnormal; Notable for the following:    pCO2 arterial 48.1 (*)    Bicarbonate 26.6 (*)    All other components within normal limits  GLUCOSE, CAPILLARY - Abnormal; Notable for the following:    Glucose-Capillary 450 (*)    All other components within normal limits  TROPONIN I  PROTIME-INR  URINALYSIS W MICROSCOPIC + REFLEX CULTURE  URINE RAPID DRUG SCREEN (HOSP PERFORMED)  CG4 I-STAT (LACTIC ACID)   Imaging Review Ct Head Wo Contrast  06/21/2013   CLINICAL DATA:  Altered mental status  EXAM: CT HEAD WITHOUT CONTRAST  TECHNIQUE: Contiguous axial images were obtained from the base of the skull through the vertex without intravenous contrast. Study was obtained within 24 hr of patient's arrival at the emergency department.  COMPARISON:  None.  FINDINGS: There is mild diffuse atrophy. There is no mass, hemorrhage, extra-axial fluid collection, or midline shift. There is mild patchy small vessel disease in the centra semiovale bilaterally. Elsewhere gray-white compartments appear normal. There is no demonstrable acute infarct.  There is a questionable aneurysm arising on the right just superior to the sella measuring 9 by 7 mm. It is best seen on axial slice 15.  Bony calvarium appears intact.  The mastoid air cells are clear.  IMPRESSION: Mild atrophy with patchy small vessel disease in the centra semiovale bilaterally. No intracranial mass, hemorrhage, or acute appearing infarct. Suspect aneurysm on the right just superior to the sella ; CT or MR angiography to further assess would be advisable.   Electronically Signed   By: Lowella Grip M.D.   On: 06/21/2013 20:26   Dg Chest Port 1 View  06/21/2013   CLINICAL DATA:  Status post intubation.  EXAM: PORTABLE CHEST - 1 VIEW  COMPARISON:  06/21/2013  FINDINGS: Endotracheal tube  extends into the left mainstem bronchus. There are patchy lung densities which probably represent edema. Heart size is enlarged. No evidence for a pneumothorax.  IMPRESSION: Endotracheal tube in the left mainstem bronchus.  Patchy interstitial densities suggest edema.  Cardiomegaly.  These results were called by  telephone at the time of interpretation on 06/21/2013 at 8:40 PM to Dr. Shea Evans, Aline Brochure , who verbally acknowledged these results.   Electronically Signed   By: Markus Daft M.D.   On: 06/21/2013 20:41   Dg Chest Port 1 View  06/21/2013   CLINICAL DATA:  Chest pain.  EXAM: PORTABLE CHEST - 1 VIEW  COMPARISON:  04/12/2010  FINDINGS: There are diffuse densities on both sides of the chest. Slightly low lung volumes. Heart size is upper limits of normal and likely accentuated by the technique.  IMPRESSION: Bilateral lung densities. Lung densities may be related to low lung volumes and technique but cannot exclude pulmonary edema.   Electronically Signed   By: Markus Daft M.D.   On: 06/21/2013 18:56    EKG Interpretation    Date/Time:  Monday June 21 2013 17:47:54 EST Ventricular Rate:  104 PR Interval:  171 QRS Duration: 101 QT Interval:  362 QTC Calculation: 476 R Axis:   -13 Text Interpretation:  Sinus tachycardia Consider left atrial enlargement Anterior infarct, old Minimal ST depression, diffuse leads Confirmed by Kameron Glazebrook  MD, Emme Rosenau ([id number]) on 06/21/2013 11:20:34 PM           CRITICAL CARE Performed by: Pamella Pert, S Total critical care time: 1 hour 20 minutes Critical care time was exclusive of separately billable procedures and treating other patients. Critical care was necessary to treat or prevent imminent or life-threatening deterioration. Critical care was time spent personally by me on the following activities: development of treatment plan with patient and/or surrogate as well as nursing, discussions with consultants, evaluation of patient's response to treatment,  examination of patient, obtaining history from patient or surrogate, ordering and performing treatments and interventions, ordering and review of laboratory studies, ordering and review of radiographic studies, pulse oximetry and re-evaluation of patient's condition.  MDM   1. Seizure   2. Altered mental status   3. DKA (diabetic ketoacidoses)   4. Hypercarbia   5. OSA (obstructive sleep apnea)   6. Acute respiratory failure   7. CKD (chronic kidney disease) stage 3, GFR 30-59 ml/min   8. Malignant hypertension   9. Sellar or suprasellar mass    5:54 PM 63 y.o. male with a history of hypertension and diabetes who presents with altered mental status. EMS reports that the patient called his brother approximately 2 hours ago and sounded confused at that time. The brother came to check on him 1-1/2 hours later and found him confused in his truck. EMS reports that the patient was in history upon arrival and the truck was not turned on. The truck was also outside. They state that the patient was interactive but seemed anxious and mildly confused. On arrival to the resuscitation bay he had a diffuse tonic-clonic seizure lasting approximately 45 seconds which was witnessed by nursing but not by me. He appears drowsy and postictal on exam currently. GCS 9, Will monitor airway closely.   Pt now more alert, thrashing in bed, confused. Family here and states he has no hx of seizures. Per family report, but was acting unusual when he left his work today. Initially planned to intubate but pt became more alert and would tell me his name although still confused. I decided to give him a small amount of time for his MS to clear and go ahead and get the CT head.   The pt had another seizure lasting <30 seconds. He still has not had his CT head. He is now post  ictal, but agitated w/ arousal and remains confused. Will go ahead and intubate in order to ensure that we can get the CT head as well as to protect his airway.    Ct neg, but concern for aneurysm. Discussed case w/ Neurology who recommends Keppra. Will get CTA. Pt now on propofol gtt for sedation while on mechanical vent. Will see if this helps bring down his BP.   Pt is s/p 2L IVF, making urine appropriately, will give 10 meq IV potassium. Will hold insulin until potassium repleted. Suprasellar mass noted on CTA. BP decreasing appropriately w/ propofol sedation, on my exam it is currently 160/90. Plan on admission to [facility].  Blanchard Kelch, MD 06/22/13 [phone]

## 2013-06-21 NOTE — ED Notes (Signed)
Bed: RESA Expected date:  Expected time:  Means of arrival:  Comments: EMS  

## 2013-06-21 NOTE — ED Notes (Signed)
Critical lab Potassium 2.8

## 2013-06-21 NOTE — ED Notes (Addendum)
Per EMS, called by brother who had called pt and thought pt was confused, EMS found pt in locked truck, pt confused, diaphoretic, CBG 484, on arrival, pt confused, attempting to get off of stretcher, pt had seizure when onto stretcher lasting app 45 seconds, Dr Aline Brochure at bedside

## 2013-06-21 NOTE — ED Notes (Addendum)
Pt's wife and brother at bedside, pt is calming down little he is able to tell us his name; he is still combative, trying to pull IV, pt has soft restraints bilateral arms per VO from Dr Aline Brochure. O2 sats are 98% on room air. Will continue to monitor closely.

## 2013-06-22 ENCOUNTER — Inpatient Hospital Stay (HOSPITAL_COMMUNITY): Payer: BC Managed Care – PPO

## 2013-06-22 ENCOUNTER — Encounter (HOSPITAL_COMMUNITY): Payer: Self-pay | Admitting: Emergency Medicine

## 2013-06-22 DIAGNOSIS — N184 Chronic kidney disease, stage 4 (severe): Secondary | ICD-10-CM

## 2013-06-22 DIAGNOSIS — N179 Acute kidney failure, unspecified: Secondary | ICD-10-CM | POA: Diagnosis present

## 2013-06-22 DIAGNOSIS — R220 Localized swelling, mass and lump, head: Secondary | ICD-10-CM

## 2013-06-22 DIAGNOSIS — R4182 Altered mental status, unspecified: Secondary | ICD-10-CM

## 2013-06-22 DIAGNOSIS — G9389 Other specified disorders of brain: Secondary | ICD-10-CM | POA: Diagnosis not present

## 2013-06-22 DIAGNOSIS — J9600 Acute respiratory failure, unspecified whether with hypoxia or hypercapnia: Secondary | ICD-10-CM

## 2013-06-22 DIAGNOSIS — R0989 Other specified symptoms and signs involving the circulatory and respiratory systems: Secondary | ICD-10-CM

## 2013-06-22 DIAGNOSIS — R0609 Other forms of dyspnea: Secondary | ICD-10-CM

## 2013-06-22 DIAGNOSIS — I10 Essential (primary) hypertension: Secondary | ICD-10-CM | POA: Diagnosis present

## 2013-06-22 LAB — CBC
HEMATOCRIT: 35.4 % — AB (ref 39.0–52.0)
HEMATOCRIT: 37.6 % — AB (ref 39.0–52.0)
HEMOGLOBIN: 11.7 g/dL — AB (ref 13.0–17.0)
Hemoglobin: 12.4 g/dL — ABNORMAL LOW (ref 13.0–17.0)
MCH: 29.3 pg (ref 26.0–34.0)
MCH: 29.2 pg (ref 26.0–34.0)
MCHC: 33.1 g/dL (ref 30.0–36.0)
MCHC: 33 g/dL (ref 30.0–36.0)
MCV: 88.7 fL (ref 78.0–100.0)
MCV: 88.5 fL (ref 78.0–100.0)
PLATELETS: 323 10*3/uL (ref 150–400)
Platelets: 340 10*3/uL (ref 150–400)
RBC: 3.99 MIL/uL — ABNORMAL LOW (ref 4.22–5.81)
RBC: 4.25 MIL/uL (ref 4.22–5.81)
RDW: 16.4 % — AB (ref 11.5–15.5)
RDW: 16.4 % — AB (ref 11.5–15.5)
WBC: 11.6 10*3/uL — AB (ref 4.0–10.5)
WBC: 11.4 10*3/uL — AB (ref 4.0–10.5)

## 2013-06-22 LAB — URINALYSIS W MICROSCOPIC + REFLEX CULTURE
Bilirubin Urine: NEGATIVE
Glucose, UA: >1000 mg/dL — AB
Ketones, ur: NEGATIVE mg/dL
Leukocytes, UA: NEGATIVE
NITRITE: NEGATIVE
Protein, ur: >300 mg/dL — AB
Specific Gravity, Urine: 1.017 (ref 1.005–1.030)
UROBILINOGEN UA: 0.2 mg/dL (ref 0.0–1.0)
pH: 7 (ref 5.0–8.0)

## 2013-06-22 LAB — SALICYLATE LEVEL: Salicylate Lvl: <2 mg/dL — ABNORMAL LOW (ref 2.8–20.0)

## 2013-06-22 LAB — BASIC METABOLIC PANEL
BUN: 25 mg/dL — ABNORMAL HIGH (ref 6–23)
BUN: 27 mg/dL — AB (ref 6–23)
CALCIUM: 8.4 mg/dL (ref 8.4–10.5)
CO2: 26 mEq/L (ref 19–32)
CO2: 21 mEq/L (ref 19–32)
Calcium: 8.2 mg/dL — ABNORMAL LOW (ref 8.4–10.5)
Chloride: 105 mEq/L (ref 96–112)
Chloride: 110 mEq/L (ref 96–112)
Creatinine, Ser: 1.7 mg/dL — ABNORMAL HIGH (ref 0.50–1.35)
Creatinine, Ser: 1.75 mg/dL — ABNORMAL HIGH (ref 0.50–1.35)
GFR calc Af Amer: 46 mL/min — ABNORMAL LOW (ref >90)
GFR calc non Af Amer: 41 mL/min — ABNORMAL LOW (ref >90)
GFR calc non Af Amer: 40 mL/min — ABNORMAL LOW (ref >90)
GFR, EST AFRICAN AMERICAN: 48 mL/min — AB (ref >90)
Glucose, Bld: 183 mg/dL — ABNORMAL HIGH (ref 70–99)
Glucose, Bld: 123 mg/dL — ABNORMAL HIGH (ref 70–99)
Potassium: 2.8 mEq/L — CL (ref 3.7–5.3)
Potassium: 5.4 mEq/L — ABNORMAL HIGH (ref 3.7–5.3)
Sodium: 145 mEq/L (ref 137–147)
Sodium: 146 mEq/L (ref 137–147)

## 2013-06-22 LAB — MAGNESIUM: Magnesium: 1.5 mg/dL (ref 1.5–2.5)

## 2013-06-22 LAB — GLUCOSE, CAPILLARY
GLUCOSE-CAPILLARY: 395 mg/dL — AB (ref 70–99)
GLUCOSE-CAPILLARY: 311 mg/dL — AB (ref 70–99)
GLUCOSE-CAPILLARY: 275 mg/dL — AB (ref 70–99)
GLUCOSE-CAPILLARY: 225 mg/dL — AB (ref 70–99)
GLUCOSE-CAPILLARY: 139 mg/dL — AB (ref 70–99)
Glucose-Capillary: 365 mg/dL — ABNORMAL HIGH (ref 70–99)
Glucose-Capillary: 404 mg/dL — ABNORMAL HIGH (ref 70–99)
Glucose-Capillary: 330 mg/dL — ABNORMAL HIGH (ref 70–99)
Glucose-Capillary: 232 mg/dL — ABNORMAL HIGH (ref 70–99)
Glucose-Capillary: 145 mg/dL — ABNORMAL HIGH (ref 70–99)
Glucose-Capillary: 137 mg/dL — ABNORMAL HIGH (ref 70–99)
Glucose-Capillary: 119 mg/dL — ABNORMAL HIGH (ref 70–99)
Glucose-Capillary: 200 mg/dL — ABNORMAL HIGH (ref 70–99)
Glucose-Capillary: 190 mg/dL — ABNORMAL HIGH (ref 70–99)
Glucose-Capillary: 146 mg/dL — ABNORMAL HIGH (ref 70–99)
Glucose-Capillary: 120 mg/dL — ABNORMAL HIGH (ref 70–99)
Glucose-Capillary: 144 mg/dL — ABNORMAL HIGH (ref 70–99)

## 2013-06-22 LAB — CREATININE, SERUM
Creatinine, Ser: 1.66 mg/dL — ABNORMAL HIGH (ref 0.50–1.35)
GFR calc non Af Amer: 43 mL/min — ABNORMAL LOW (ref >90)
GFR, EST AFRICAN AMERICAN: 49 mL/min — AB (ref >90)

## 2013-06-22 LAB — BLOOD GAS, ARTERIAL
Acid-Base Excess: 0.2 mmol/L (ref 0.0–2.0)
BICARBONATE: 24.2 meq/L — AB (ref 20.0–24.0)
DRAWN BY: 331761
FIO2: 0.3 %
MECHVT: 620 mL
O2 SAT: 91.2 %
PCO2 ART: 38 mmHg (ref 35.0–45.0)
PEEP: 5 cmH2O
PO2 ART: 62.7 mmHg — AB (ref 80.0–100.0)
Patient temperature: 98.6
RATE: 18 resp/min
TCO2: 25.3 mmol/L (ref 0–100)
pH, Arterial: 7.419 (ref 7.350–7.450)

## 2013-06-22 LAB — TROPONIN I: Troponin I: <0.3 ng/mL (ref <0.30)

## 2013-06-22 LAB — MRSA PCR SCREENING: MRSA by PCR: POSITIVE — AB

## 2013-06-22 LAB — RAPID URINE DRUG SCREEN, HOSP PERFORMED
Amphetamines: NOT DETECTED
Barbiturates: NOT DETECTED
Benzodiazepines: NOT DETECTED
Cocaine: NOT DETECTED
Opiates: NOT DETECTED
TETRAHYDROCANNABINOL: NOT DETECTED

## 2013-06-22 LAB — ACETAMINOPHEN LEVEL: Acetaminophen (Tylenol), Serum: <15 ug/mL (ref 10–30)

## 2013-06-22 LAB — TSH: TSH: 3.297 u[IU]/mL (ref 0.350–4.500)

## 2013-06-22 LAB — PHOSPHORUS: PHOSPHORUS: 2.7 mg/dL (ref 2.3–4.6)

## 2013-06-22 MED ORDER — CHLORHEXIDINE GLUCONATE CLOTH 2 % EX PADS
6.0000 | MEDICATED_PAD | Freq: Every day | CUTANEOUS | Status: AC
Start: 2013-06-22 — End: 2013-06-26
  Administered 2013-06-22 – 2013-06-26 (×5): 6 via TOPICAL

## 2013-06-22 MED ORDER — DEXTROSE 5 % IV SOLN
10.0000 mg/kg | Freq: Three times a day (TID) | INTRAVENOUS | Status: DC
  Administered 2013-06-22 – 2013-06-24 (×7): 775 mg via INTRAVENOUS
  Filled 2013-06-22 (×11): qty 15.5

## 2013-06-22 MED ORDER — VITAL AF 1.2 CAL PO LIQD
1000.0000 mL | ORAL | Status: DC
  Filled 2013-06-22: qty 1000

## 2013-06-22 MED ORDER — DEXTROSE 10 % IV SOLN: INTRAVENOUS | Status: DC | PRN

## 2013-06-22 MED ORDER — FUROSEMIDE 80 MG PO TABS: 80.0000 mg | ORAL_TABLET | Freq: Every day | ORAL | Status: DC

## 2013-06-22 MED ORDER — SODIUM CHLORIDE 0.9 % IV SOLN: INTRAVENOUS | Status: DC

## 2013-06-22 MED ORDER — DEXTROSE-NACL 5-0.45 % IV SOLN: INTRAVENOUS | Status: DC

## 2013-06-22 MED ORDER — CARVEDILOL 6.25 MG PO TABS: 6.2500 mg | ORAL_TABLET | Freq: Two times a day (BID) | ORAL | Status: DC

## 2013-06-22 MED ORDER — CARVEDILOL 6.25 MG PO TABS
6.2500 mg | ORAL_TABLET | Freq: Two times a day (BID) | ORAL | Status: DC
  Administered 2013-06-22 – 2013-06-26 (×9): 6.25 mg
  Filled 2013-06-22 (×12): qty 1

## 2013-06-22 MED ORDER — PANTOPRAZOLE SODIUM 40 MG IV SOLR
40.0000 mg | INTRAVENOUS | Status: DC
  Administered 2013-06-22: 40 mg via INTRAVENOUS
  Filled 2013-06-22: qty 40

## 2013-06-22 MED ORDER — NICARDIPINE HCL IN NACL 20-0.86 MG/200ML-% IV SOLN
5.0000 mg/h | INTRAVENOUS | Status: DC
  Administered 2013-06-22: 5 mg/h via INTRAVENOUS
  Filled 2013-06-22: qty 200

## 2013-06-22 MED ORDER — INSULIN GLARGINE 100 UNIT/ML ~~LOC~~ SOLN
10.0000 [IU] | SUBCUTANEOUS | Status: DC
Start: 2013-06-22 — End: 2013-06-27
  Administered 2013-06-22 – 2013-06-26 (×5): 10 [IU] via SUBCUTANEOUS
  Filled 2013-06-22 (×6): qty 0.1

## 2013-06-22 MED ORDER — CLONIDINE HCL 0.2 MG PO TABS
0.2000 mg | ORAL_TABLET | Freq: Two times a day (BID) | ORAL | Status: DC
  Administered 2013-06-22 – 2013-06-26 (×11): 0.2 mg
  Filled 2013-06-22 (×14): qty 1

## 2013-06-22 MED ORDER — VITAL HIGH PROTEIN PO LIQD
1000.0000 mL | ORAL | Status: DC
  Administered 2013-06-22: 1000 mL
  Administered 2013-06-22: 23:00:00
  Administered 2013-06-24 – 2013-06-25 (×3): 1000 mL
  Administered 2013-06-25: 02:00:00
  Administered 2013-06-26: 1000 mL
  Filled 2013-06-22 (×7): qty 1000

## 2013-06-22 MED ORDER — INSULIN ASPART 100 UNIT/ML ~~LOC~~ SOLN
2.0000 [IU] | SUBCUTANEOUS | Status: DC
  Administered 2013-06-22 – 2013-06-23 (×2): 4 [IU] via SUBCUTANEOUS
  Administered 2013-06-23: 2 [IU] via SUBCUTANEOUS
  Administered 2013-06-23 – 2013-06-24 (×4): 4 [IU] via SUBCUTANEOUS
  Administered 2013-06-24 (×3): 2 [IU] via SUBCUTANEOUS
  Administered 2013-06-24 – 2013-06-26 (×9): 4 [IU] via SUBCUTANEOUS
  Administered 2013-06-26: 2 [IU] via SUBCUTANEOUS
  Administered 2013-06-26 – 2013-06-27 (×5): 4 [IU] via SUBCUTANEOUS
  Administered 2013-06-27 (×2): 2 [IU] via SUBCUTANEOUS

## 2013-06-22 MED ORDER — PRO-STAT SUGAR FREE PO LIQD
60.0000 mL | Freq: Every day | ORAL | Status: DC
  Administered 2013-06-22 – 2013-06-27 (×26): 60 mL
  Filled 2013-06-22 (×36): qty 60

## 2013-06-22 MED ORDER — INSULIN REGULAR HUMAN 100 UNIT/ML IJ SOLN
INTRAMUSCULAR | Status: DC
  Administered 2013-06-22: 3.4 [IU]/h via INTRAVENOUS
  Filled 2013-06-22 (×2): qty 1

## 2013-06-22 MED ORDER — HEPARIN SODIUM (PORCINE) 5000 UNIT/ML IJ SOLN
5000.0000 [IU] | Freq: Three times a day (TID) | INTRAMUSCULAR | Status: DC
  Administered 2013-06-22 – 2013-07-13 (×62): 5000 [IU] via SUBCUTANEOUS
  Filled 2013-06-22 (×76): qty 1

## 2013-06-22 MED ORDER — INSULIN ASPART 100 UNIT/ML ~~LOC~~ SOLN
1.0000 [IU] | SUBCUTANEOUS | Status: DC
  Administered 2013-06-22 – 2013-06-27 (×22): 1 [IU] via SUBCUTANEOUS

## 2013-06-22 MED ORDER — POTASSIUM CHLORIDE 20 MEQ/15ML (10%) PO LIQD
40.0000 meq | Freq: Once | ORAL | Status: AC
  Administered 2013-06-22: 40 meq
  Filled 2013-06-22 (×3): qty 30

## 2013-06-22 MED ORDER — SODIUM CHLORIDE 0.9 % IV SOLN
500.0000 mg | Freq: Two times a day (BID) | INTRAVENOUS | Status: DC
  Administered 2013-06-22 – 2013-06-26 (×10): 500 mg via INTRAVENOUS
  Filled 2013-06-22 (×11): qty 5

## 2013-06-22 MED ORDER — SODIUM CHLORIDE 0.9 % IV SOLN: 250.0000 mL | INTRAVENOUS | Status: DC | PRN

## 2013-06-22 MED ORDER — DEXTROSE 50 % IV SOLN: 25.0000 mL | INTRAVENOUS | Status: DC | PRN

## 2013-06-22 MED ORDER — POTASSIUM CHLORIDE 20 MEQ/15ML (10%) PO LIQD
40.0000 meq | ORAL | Status: AC
  Administered 2013-06-22 (×3): 40 meq
  Filled 2013-06-22 (×3): qty 30

## 2013-06-22 MED ORDER — AMLODIPINE BESYLATE 10 MG PO TABS: 10.0000 mg | ORAL_TABLET | Freq: Every day | ORAL | Status: DC

## 2013-06-22 MED ORDER — FUROSEMIDE 40 MG PO TABS
40.0000 mg | ORAL_TABLET | Freq: Two times a day (BID) | ORAL | Status: DC
  Administered 2013-06-22: 40 mg
  Filled 2013-06-22 (×4): qty 1

## 2013-06-22 MED ORDER — BIOTENE DRY MOUTH MT LIQD
15.0000 mL | Freq: Two times a day (BID) | OROMUCOSAL | Status: DC
  Administered 2013-06-22 – 2013-07-13 (×42): 15 mL via OROMUCOSAL

## 2013-06-22 MED ORDER — MUPIROCIN 2 % EX OINT
1.0000 "application " | TOPICAL_OINTMENT | Freq: Two times a day (BID) | CUTANEOUS | Status: AC
  Administered 2013-06-22 – 2013-06-26 (×10): 1 via NASAL
  Filled 2013-06-22 (×2): qty 22

## 2013-06-22 MED ORDER — CHLORHEXIDINE GLUCONATE 0.12 % MT SOLN
15.0000 mL | Freq: Two times a day (BID) | OROMUCOSAL | Status: DC
  Administered 2013-06-22 – 2013-07-13 (×41): 15 mL via OROMUCOSAL
  Filled 2013-06-22 (×44): qty 15

## 2013-06-22 MED ORDER — INSULIN REGULAR BOLUS VIA INFUSION
0.0000 [IU] | Freq: Three times a day (TID) | INTRAVENOUS | Status: DC
  Filled 2013-06-22: qty 10

## 2013-06-22 MED ORDER — ADULT MULTIVITAMIN LIQUID CH
5.0000 mL | Freq: Every day | ORAL | Status: DC
  Administered 2013-06-22 – 2013-06-26 (×5): 5 mL
  Filled 2013-06-22 (×7): qty 5

## 2013-06-22 MED ORDER — PANTOPRAZOLE SODIUM 40 MG PO TBEC: 40.0000 mg | DELAYED_RELEASE_TABLET | Freq: Every day | ORAL | Status: DC

## 2013-06-22 MED ORDER — SODIUM CHLORIDE 0.9 % IV SOLN
INTRAVENOUS | Status: DC
  Administered 2013-06-22 (×2): via INTRAVENOUS

## 2013-06-22 MED ORDER — VITAL AF 1.2 CAL PO LIQD
1000.0000 mL | ORAL | Status: DC
  Administered 2013-06-22: 1000 mL
  Filled 2013-06-22 (×3): qty 1000

## 2013-06-22 MED ORDER — AMLODIPINE BESYLATE 10 MG PO TABS
10.0000 mg | ORAL_TABLET | Freq: Every day | ORAL | Status: DC
  Administered 2013-06-22 – 2013-06-26 (×5): 10 mg
  Filled 2013-06-22 (×6): qty 1

## 2013-06-22 MED ORDER — PANTOPRAZOLE SODIUM 40 MG PO PACK
40.0000 mg | PACK | ORAL | Status: DC
  Administered 2013-06-23 – 2013-06-26 (×4): 40 mg
  Filled 2013-06-22 (×7): qty 20

## 2013-06-22 NOTE — Progress Notes (Signed)
Name: Blake Bell MRN: [id number] DOB: [birth year]    ADMISSION DATE:  06/21/2013  REFERRING MD :  EDP  CHIEF COMPLAINT: Altered mental status  BRIEF PATIENT DESCRIPTION:  63 yo male with altered mental status and seizures.  Intubated for airway protection.  BP in ER 214/148.  PCCM asked to admit to ICU.  SIGNIFICANT EVENTS: 1/5 Admit, neurology consulted  STUDIES:  1/5 CT head >> mild atrophy with patchy small vessel disease, ?aneurysm Rt superior to sella 1/5 CT angio head >> no aneurysm, 13 mm mass Rt sella/suprasellar region 1/6 Echo >>  1/6 EEG >>   LINES / TUBES: 1/5 ETT>>>  CULTURES: 1/5 BC>>>  ANTIBIOTICS: Acyclovir 1/5>>>  SUBJECTIVE:  Getting EEG.  VITAL SIGNS: Temp:  [98 F (36.7 C)-98.3 F (36.8 C)] 98.1 F (36.7 C) (01/06 0735) Pulse Rate:  [65-114] 72 (01/06 0900) Resp:  [16-34] 18 (01/06 0900) BP: (118-214)/(75-148) 118/75 mmHg (01/06 0900) SpO2:  [94 %-100 %] 97 % (01/06 0900) FiO2 (%):  [30 %-100 %] 30 % (01/06 0735) Weight:  [405 lb 6.8 oz (183.9 kg)] 405 lb 6.8 oz (183.9 kg) (01/06 0500) VENTILATOR SETTINGS: Vent Mode:  [-] PRVC FiO2 (%):  [30 %-100 %] 30 % Set Rate:  [14 bmp-18 bmp] 18 bmp Vt Set:  [620 mL] 620 mL PEEP:  [5 cmH20] 5 cmH20 Plateau Pressure:  [25 cmH20-28 cmH20] 27 cmH20 INTAKE / OUTPUT: Intake/Output     01/05 0701 - 01/06 0700 01/06 0701 - 01/07 0700   I.V. (mL/kg) 346.1 (1.9)    Other 60    NG/GT 2.7    IV Piggyback 165.5    Total Intake(mL/kg) 574.2 (3.1)    Urine (mL/kg/hr) 275 75 (0.1)   Total Output 275 75   Net +299.2 -75          PHYSICAL EXAMINATION: General: No distress Neuro: RASS -3, moves extremities with WUA but agitated HEENT: ETT in place Cardiovascular: regular, no murmur Lungs: decreased breath sounds, no wheeze Abdomen: obese , soft, non tender Musculoskeletal: 2+ non pitting edema Skin: chronic venous stasis changes  LABS: CBC  Recent Labs Lab 06/21/13 1750 06/22/13 0232  06/22/13 0905  WBC 12.3* 11.6* 11.4*  HGB 14.2 11.7* 12.4*  HCT 44.1 35.4* 37.6*  PLT 440* 340 323   Coag's  Recent Labs Lab 06/21/13 1750  INR 0.91   BMET  Recent Labs Lab 06/21/13 1750 06/22/13 0232  NA 144  --   K 2.8*  --   CL 97  --   CO2 26  --   BUN 25*  --   CREATININE 1.86* 1.66*  GLUCOSE 508*  --    Electrolytes  Recent Labs Lab 06/21/13 1750  CALCIUM 9.7   Sepsis Markers  Recent Labs Lab 06/21/13 2224  LATICACIDVEN 1.39   ABG  Recent Labs Lab 06/21/13 1942 06/21/13 2215 06/22/13 0522  PHART 7.262* 7.362 7.419  PCO2ART 59.8* 48.1* 38.0  PO2ART 328.0* 85.6 62.7*   Liver Enzymes  Recent Labs Lab 06/21/13 1750  AST 56*  ALT 42  ALKPHOS 161*  BILITOT <0.2*  ALBUMIN 2.8*   Cardiac Enzymes  Recent Labs Lab 06/21/13 1750 06/22/13 0232  TROPONINI <0.30 <0.30   Glucose  Recent Labs Lab 06/21/13 2210 06/22/13 0119 06/22/13 0251 06/22/13 0411 06/22/13 0514 06/22/13 0614  GLUCAP 450* 395* 365* 404* 311* 330*    Imaging Ct Angio Head W/cm &/or Wo Cm  06/21/2013   CLINICAL DATA:  Unresponsive, intubated.  Possible  aneurysm.  EXAM: CT ANGIOGRAPHY HEAD  TECHNIQUE: Multidetector CT imaging of the head was performed using the standard protocol during bolus administration of intravenous contrast. Multiplanar CT image reconstructions including MIPs were obtained to evaluate the vascular anatomy.  CONTRAST:  124mL OMNIPAQUE IOHEXOL 350 MG/ML SOLN  COMPARISON:  CT of the head June 21, 2013 at 2015 hr.  FINDINGS: Anterior circulation: Normal appearance of the included cervical internal carotid artery, the petrous, cavernous and supra clinoid internal carotid arteries. 2 mm calcified plaque along the undersurface of right cavernous carotid artery. Additional trace calcific atherosclerosis of the carotid siphons. The right carotid terminus is anatomically superior in positioning to the left, which could account for noncontrast CT appearance.  Normal appearance of the anterior and middle cerebral arteries, including distal segments. Mild dolichoectasia may reflect chronic hypertension.  Posterior circulation: Right vertebral artery is dominant, mild irregularity of the left vertebral artery with up to 50% narrowing, however the left vertebral artery predominantly terminates in the left posterior inferior cerebellar artery. Basilar artery and main branch vessels are unremarkable. Robust right posterior communicating artery present. Normal appearance of the posterior cerebral arteries.  No large vessel occlusion, aneurysm, high-grade stenosis.  Expansile sellar/ suprasellar mass intermediate density (with 1-2 mm suprasellar extent) measures at least 13 x 13 mm, scalloping and expanding the sella turcica, without bony erosion. Review of the MIP images confirms the above findings.  IMPRESSION: No aneurysm. Dolicoectatic appearance of the intracranial vessels favors chronic hypertension. Mild irregularity of the left vertebral artery may reflect atherosclerosis though, left vertebral artery predominately terminates in the left PICA.  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   Electronically Signed   By: Elon Alas   On: 06/21/2013 23:38   Ct Head Wo Contrast  06/21/2013   CLINICAL DATA:  Altered mental status  EXAM: CT HEAD WITHOUT CONTRAST  TECHNIQUE: Contiguous axial images were obtained from the base of the skull through the vertex without intravenous contrast. Study was obtained within 24 hr of patient's arrival at the emergency department.  COMPARISON:  None.  FINDINGS: There is mild diffuse atrophy. There is no mass, hemorrhage, extra-axial fluid collection, or midline shift. There is mild patchy small vessel disease in the centra semiovale bilaterally. Elsewhere gray-white compartments appear normal. There is no  demonstrable acute infarct.  There is a questionable aneurysm arising on the right just superior to the sella measuring 9 by 7 mm. It is best seen on axial slice 15.  Bony calvarium appears intact.  The mastoid air cells are clear.  IMPRESSION: Mild atrophy with patchy small vessel disease in the centra semiovale bilaterally. No intracranial mass, hemorrhage, or acute appearing infarct. Suspect aneurysm on the right just superior to the sella ; CT or MR angiography to further assess would be advisable.   Electronically Signed   By: Lowella Grip M.D.   On: 06/21/2013 20:26   Dg Chest Port 1 View  06/21/2013   CLINICAL DATA:  Status post intubation.  EXAM: PORTABLE CHEST - 1 VIEW  COMPARISON:  06/21/2013  FINDINGS: Endotracheal tube extends into the left mainstem bronchus. There are patchy lung densities which probably represent edema. Heart size is enlarged. No evidence for a pneumothorax.  IMPRESSION: Endotracheal tube in the left mainstem bronchus.  Patchy interstitial densities suggest edema.  Cardiomegaly.  These results were called by telephone at the time of interpretation on  06/21/2013 at 8:40 PM to Dr. Shea Evans, Aline Brochure , who verbally acknowledged these results.   Electronically Signed   By: Markus Daft M.D.   On: 06/21/2013 20:41   Dg Chest Port 1 View  06/21/2013   CLINICAL DATA:  Chest pain.  EXAM: PORTABLE CHEST - 1 VIEW  COMPARISON:  04/12/2010  FINDINGS: There are diffuse densities on both sides of the chest. Slightly low lung volumes. Heart size is upper limits of normal and likely accentuated by the technique.  IMPRESSION: Bilateral lung densities. Lung densities may be related to low lung volumes and technique but cannot exclude pulmonary edema.   Electronically Signed   By: Markus Daft M.D.   On: 06/21/2013 18:56    ASSESSMENT / PLAN:  PULMONARY A: Acute respiratory failure in setting of encephalopathy and seizures. Hx of OSA >> followed by Dr. Gwenette Greet as outpt. P:   -vent support  until neuro status more stable -f/u CXR  CARDIOVASCULAR A:  Malignant HTN. P:  -f/u Echo -goal SBP < 170 -continue amlodipine, carvedilol, clonidine, lasix -hold outpt enalapril, cozaar, aldactone for now  RENAL A:  Stage 3 CKD.  P:   -f/u renal fx, urine outpt, electrolytes  GASTROINTESTINAL A:  Nutrition. Morbid obesity. P:   -NPO -protonix for SUP -tube feeds while on vent  HEMATOLOGIC A:  Mild leukocytosis. P:  -SQ heparin for DVT prevention -f/u CBC  INFECTIOUS A:   ?HSV as cause of altered mental status >> seems less likely. Chronic stasis ulcers of lower extremities c P:   -continue acyclovir per now per neurology >> low threshold to d/c this -aquacel per wound care team  ENDOCRINE A:  DM type II with hyperglycemia. P:   -insulin gtt -hold outpt glyburide, metformin for now  NEUROLOGIC A:   Acute encephalopathy most likely 2nd to malignant HTN complicated by seizures.  Less likely menigitis/HSV >> unable to do MRI due to pt body habitus. 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 P:   -keppra per neurology -f/u EEG  CC time 45 minutes.  Updated family at bedside.  Difficult IV access >> discussed option of central line >> family very reluctant for this and would prefer PICC line placement if possible.  Chesley Mires, MD [facility] Pulmonary/Critical Care 06/22/2013, 10:20 AM Pager:  [phone] After 3pm call: [phone]

## 2013-06-22 NOTE — ED Notes (Signed)
One set of blood cultures obtained by Caryl Comes RN

## 2013-06-22 NOTE — Progress Notes (Addendum)
INITIAL NUTRITION ASSESSMENT  DOCUMENTATION CODES Per approved criteria  -Not Applicable   INTERVENTION:  1. Initiate Vital High Protein @ 15 ml/hr   2. 60 ml Prostat six times per day.  3. MVI daily  Tube feeding regimen provides 1560 kcal, 211 grams of protein, and 300 ml of H2O.   TF regimen and current propofol provides 2201 total kcal per day (11.6 kcal/kg of actual weight)  NUTRITION DIAGNOSIS: Inadequate oral intake related to inability to eat as evidenced by NPO status.  Goal: Enteral nutrition to provide 60-70% of estimated calorie needs (22-25 kcals/kg ideal body weight) and 100% of estimated protein needs, based on ASPEN guidelines for permissive underfeeding in critically ill obese individuals.  Monitor:  TF initiation and tolerance, weight trend, labs   Reason for Assessment: Consult received to initiate and manage enteral nutrition support.  63 y.o. male  Admitting Dx: <principal problem not specified>  ASSESSMENT: Pt admitted after being found locked in his car. Pt unresponsive and having seizures.  Patient is currently intubated on ventilator support.  MV: 14 L/min Temp (24hrs), Avg:98.1 F (36.7 C), Min:98 F (36.7 C), Max:98.3 F (36.8 C)  Propofol: 24.3 ml/hr providing 641 kcal per day from lipid Per RN Propofol rate likely to stay the same or lower.  Nutrition-focused physical exam deferred at this time as pt in procedure.  Pt with BLE wounds, WOC following.  Pt with low potassium and elevated blood glucose.   Height: Ht Readings from Last 1 Encounters:  06/21/13 6' (1.829 m)    Weight: Wt Readings from Last 1 Encounters:  06/22/13 405 lb 6.8 oz (183.9 kg)    Ideal Body Weight: 80.9 kg   % Ideal Body Weight: 227%  Wt Readings from Last 10 Encounters:  06/22/13 405 lb 6.8 oz (183.9 kg)  05/25/13 395 lb 12.8 oz (179.534 kg)  04/05/13 392 lb (177.81 kg)  04/05/13 392 lb (177.81 kg)    Usual Body Weight: 395 lb   % Usual Body  Weight: >100%  BMI:  Body mass index is 54.97 kg/(m^2).  Estimated Nutritional Needs: Kcal: 2831 Protein: >/= 202 grams  Fluid: >2.2 L/day  Skin: BLE chronic full thickness wounds on calves   Diet Order:    EDUCATION NEEDS: -No education needs identified at this time   Intake/Output Summary (Last 24 hours) at 06/22/13 0930 Last data filed at 06/22/13 0600  Gross per 24 hour  Intake 574.24 ml  Output    275 ml  Net 299.24 ml    Last BM: PTA   Labs:   Recent Labs Lab 06/21/13 1750 06/22/13 0232  NA 144  --   K 2.8*  --   CL 97  --   CO2 26  --   BUN 25*  --   CREATININE 1.86* 1.66*  CALCIUM 9.7  --   GLUCOSE 508*  --     CBG (last 3)   Recent Labs  06/22/13 0411 06/22/13 0514 06/22/13 0614  GLUCAP 404* 311* 330*    Scheduled Meds: . acyclovir  10 mg/kg (Ideal) Intravenous Q8H  . antiseptic oral rinse  15 mL Mouth Rinse q12n4p  . chlorhexidine  15 mL Mouth Rinse BID  . Chlorhexidine Gluconate Cloth  6 each Topical Q0600  . cloNIDine  0.2 mg Per Tube BID  . etomidate  10 mg Intravenous Once  . heparin  5,000 Units Subcutaneous Q8H  . insulin regular  0-10 Units Intravenous TID WC  . levETIRAcetam  500  mg Intravenous Q12H  . mupirocin ointment  1 application Nasal BID  . pantoprazole (PROTONIX) IV  40 mg Intravenous Q24H    Continuous Infusions: . sodium chloride 20 mL/hr at 06/22/13 0600  . dextrose 5 % and 0.45% NaCl    . feeding supplement (VITAL AF 1.2 CAL) 1,000 mL (06/22/13 0600)  . insulin (NOVOLIN-R) infusion 6.9 Units/hr (06/22/13 0830)  . niCARDipine    . propofol 30 mcg/kg/min (06/22/13 2585)    Past Medical History  Diagnosis Date  . Hypertension   . Diabetes mellitus without complication   . Sleep apnea     uses cpap, setting of 5  . Chronic kidney disease     ssees dr Florene Glen nephrology every 6-9 months  . Multiple wounds     on lower legs - sees [name] Pain at the [facility]    Past Surgical History  Procedure  Laterality Date  . Colonscopy  6-7 yrs ago  . Colonoscopy N/A 04/05/2013    Procedure: COLONOSCOPY;  Surgeon: Juanita Craver, MD;  Location: [facility];  Service: Endoscopy;  Laterality: N/A;    Maylon Peppers RD, [place], [place] Pager [phone] After Hours Pager

## 2013-06-22 NOTE — Progress Notes (Signed)
ANTIBIOTIC CONSULT NOTE - INITIAL  Pharmacy Consult for Acyclovir Indication: R/o HSV encephalitis  No Known Allergies  Patient Measurements: Height: 6' (182.9 cm) Weight: 405 lb 6.8 oz (183.9 kg) (documented 06/21/13) IBW/kg (Calculated) : 77.6   Vital Signs: Temp: 98.3 F (36.8 C) (01/05 2135) Temp src: Rectal (01/05 2135) BP: 159/87 mmHg (01/06 0121) Pulse Rate: 71 (01/06 0121) Intake/Output from previous day:   Intake/Output from this shift:    Labs:  Recent Labs  06/21/13 1750  WBC 12.3*  HGB 14.2  PLT 440*  CREATININE 1.86*   Estimated Creatinine Clearance: 70 ml/min (by C-G formula based on Cr of 1.86). No results found for this basename: VANCOTROUGH, VANCOPEAK, VANCORANDOM, GENTTROUGH, GENTPEAK, GENTRANDOM, TOBRATROUGH, TOBRAPEAK, TOBRARND, AMIKACINPEAK, AMIKACINTROU, AMIKACIN,  in the last 72 hours   Microbiology: No results found for this or any previous visit (from the past 720 hour(s)).  Medical History: Past Medical History  Diagnosis Date  . Hypertension   . Diabetes mellitus without complication   . Sleep apnea     uses cpap, setting of 5  . Chronic kidney disease     ssees dr Florene Glen nephrology every 6-9 months  . Multiple wounds     on lower legs - sees [name] Pain at the [facility]    Medications:  Scheduled:  . cloNIDine  0.2 mg Per Tube BID  . etomidate  10 mg Intravenous Once  . heparin  5,000 Units Subcutaneous Q8H  . lidocaine (cardiac) 100 mg/34ml      . pantoprazole  40 mg Oral Q1200  . potassium chloride  40 mEq Per Tube Once  . rocuronium       Infusions:  . sodium chloride    . sodium chloride    . sodium chloride    . acyclovir    . dextrose 5 % and 0.45% NaCl    . feeding supplement (VITAL AF 1.2 CAL)    . insulin (NOVOLIN-R) infusion    . insulin regular    . levETIRAcetam    . propofol 40 mcg/kg/min (06/22/13 0145)   Assessment: 63 yo admitted with new onset seizures.  MD ordering Acyclovir for possible HSV  encephalitis.  Goal of Therapy:  Treat infection  Plan:   Acyclovir 775mg  IV q8h (based on IBW)  F/U Scr as needed  Lawana Pai R 06/22/2013,1:48 AM

## 2013-06-22 NOTE — Progress Notes (Signed)
EEG completed; results pending.    

## 2013-06-22 NOTE — Consult Note (Signed)
Reason for Consult:Seizures Referring Physician: Titus Mould  CC: Seizures  HPI: Blake Bell is an 63 y.o. male with a history of hypertension who is currently intubated and sedated.  History obtained from the chart.  It seems that the patient was feeling poorly today.  His brother called him and felt that he was confused.  EMS was called and they found the patient locked in his car and unresponsive.   Patient was removed.  While on transport stretcher was noted to have a seizure.  Had another while in ED.  Was confused and combative interictally.    Past Medical History  Diagnosis Date  . Hypertension   . Diabetes mellitus without complication   . Sleep apnea     uses cpap, setting of 5  . Chronic kidney disease     ssees dr Florene Glen nephrology every 6-9 months  . Multiple wounds     on lower legs - sees [name] Pain at the [facility]    Past Surgical History  Procedure Laterality Date  . Colonscopy  6-7 yrs ago  . Colonoscopy N/A 04/05/2013    Procedure: COLONOSCOPY;  Surgeon: Juanita Craver, MD;  Location: [facility];  Service: Endoscopy;  Laterality: N/A;    Family History  Problem Relation Age of Onset  . Diabetes Mother   . Diabetes Father   . Diabetes Sister   . Diabetes Brother   . Hypertension Mother   . Hypertension Father     Social History:  reports that he quit smoking about 43 years ago. His smoking use included Cigarettes. He has a .5 pack-year smoking history. He has never used smokeless tobacco. He reports that he does not drink alcohol or use illicit drugs.  No Known Allergies  Medications:  I have reviewed the patient's current medications. Prior to Admission:  Prescriptions prior to admission  Medication Sig Dispense Refill  . amLODipine (NORVASC) 10 MG tablet Take 10 mg by mouth daily.      . carvedilol (COREG) 6.25 MG tablet Take 6.25 mg by mouth 2 (two) times daily with a meal.      . cloNIDine (CATAPRES) 0.2 MG tablet Take 0.2 mg by mouth daily.       . enalapril (VASOTEC) 20 MG tablet Take 40 mg by mouth every evening.       . furosemide (LASIX) 80 MG tablet Take 80 mg by mouth daily.       Marland Kitchen glyBURIDE-metformin (GLUCOVANCE) 2.5-500 MG per tablet Take 2 tablets by mouth 2 (two) times daily with a meal.       . losartan (COZAAR) 100 MG tablet Take 100 mg by mouth daily.       . penicillin v potassium (VEETID) 500 MG tablet Take 500 mg by mouth 2 (two) times daily. continuous      . potassium chloride SA (K-DUR,KLOR-CON) 20 MEQ tablet Take 40 mEq by mouth 2 (two) times daily.       Marland Kitchen spironolactone (ALDACTONE) 50 MG tablet Take 50 mg by mouth daily.       Scheduled: . acyclovir  10 mg/kg (Ideal) Intravenous Q8H  . cloNIDine  0.2 mg Per Tube BID  . etomidate  10 mg Intravenous Once  . heparin  5,000 Units Subcutaneous Q8H  . insulin regular  0-10 Units Intravenous TID WC  . levETIRAcetam  500 mg Intravenous Q12H  . lidocaine (cardiac) 100 mg/5ml      . pantoprazole (PROTONIX) IV  40 mg Intravenous Q24H  . potassium  chloride  40 mEq Per Tube Once  . rocuronium        ROS: Unable to obtain  Physical Examination: Blood pressure 171/102, pulse 66, temperature 98 F (36.7 C), temperature source Oral, resp. rate 18, height 6' (1.829 m), weight 183.9 kg (405 lb 6.8 oz), SpO2 98.00%.  Neurologic Examination Mental Status: Patient does not respond to verbal stimuli.  Moves extremities little with deep sternal rub and grimaces.  Does not follow commands.  No verbalizations noted.  Cranial Nerves: II: patient does not respond confrontation bilaterally, pupils right 4 mm, left 4 mm,and reactive bilaterally III,IV,VI: doll's response absent bilaterally.  V,VII: corneal reflex present bilaterally  VIII: patient does not respond to verbal stimuli IX,X: gag reflex reduced, XI: trapezius strength unable to test bilaterally XII: tongue strength unable to test Motor: No spontaneous movement noted.  No purposeful movements  noted. Sensory: Does seem to have some withdrawal with noxious stimuli in all extremities.   Deep Tendon Reflexes:  2+ in the upper extremities, trace at the knees and absent at the ankles.   Plantars: downgoing bilaterally Cerebellar: Unable to perform    Laboratory Studies:   Basic Metabolic Panel:  Recent Labs Lab 06/21/13 1750 06/22/13 0232  NA 144  --   K 2.8*  --   CL 97  --   CO2 26  --   GLUCOSE 508*  --   BUN 25*  --   CREATININE 1.86* 1.66*  CALCIUM 9.7  --     Liver Function Tests:  Recent Labs Lab 06/21/13 1750  AST 56*  ALT 42  ALKPHOS 161*  BILITOT <0.2*  PROT 7.9  ALBUMIN 2.8*   No results found for this basename: LIPASE, AMYLASE,  in the last 168 hours No results found for this basename: AMMONIA,  in the last 168 hours  CBC:  Recent Labs Lab 06/21/13 1750 06/22/13 0232  WBC 12.3* 11.6*  NEUTROABS 7.6  --   HGB 14.2 11.7*  HCT 44.1 35.4*  MCV 90.4 88.7  PLT 440* 340    Cardiac Enzymes:  Recent Labs Lab 06/21/13 1750 06/22/13 0232  TROPONINI <0.30 <0.30    BNP: No components found with this basename: POCBNP,   CBG:  Recent Labs Lab 06/21/13 1743 06/21/13 2210 06/22/13 0119 06/22/13 0251  GLUCAP 520* 450* 395* 365*    Microbiology: Results for orders placed during the hospital encounter of 06/21/13  MRSA PCR SCREENING     Status: Abnormal   Collection Time    06/22/13  3:53 AM      Result Value Range Status   MRSA by PCR POSITIVE (*) NEGATIVE Final   Comment:            The GeneXpert MRSA Assay (FDA     approved for NASAL specimens     only), is one component of a     comprehensive MRSA colonization     surveillance program. It is not     intended to diagnose MRSA     infection nor to guide or     monitor treatment for     MRSA infections.     RESULT CALLED TO, READ BACK BY AND VERIFIED WITH:     POTTER,N RN [id number] 06/22/13 MITCHELL,L    Coagulation Studies:  Recent Labs  06/21/13 1750  LABPROT 12.1   INR 0.91    Urinalysis:  Recent Labs Lab 06/21/13 2238  COLORURINE YELLOW  LABSPEC 1.017  PHURINE 7.0  GLUCOSEU >1000*  [place] >300*  UROBILINOGEN 0.2  NITRITE NEGATIVE  LEUKOCYTESUR NEGATIVE    Lipid Panel:  No results found for this basename: chol, trig, hdl, cholhdl, vldl, ldlcalc    HgbA1C:  No results found for this basename: HGBA1C    Urine Drug Screen:     Component Value Date/Time   LABOPIA NONE DETECTED 06/21/2013 2238   COCAINSCRNUR NONE DETECTED 06/21/2013 2238   LABBENZ NONE DETECTED 06/21/2013 2238   AMPHETMU NONE DETECTED 06/21/2013 2238   THCU NONE DETECTED 06/21/2013 2238   LABBARB NONE DETECTED 06/21/2013 2238    Alcohol Level: No results found for this basename: ETH,  in the last 168 hours  Other results: EKG: sinus tachycardia at 104 bpm.  Imaging: Ct Angio Head W/cm &/or Wo Cm  06/21/2013   CLINICAL DATA:  Unresponsive, intubated.  Possible aneurysm.  EXAM: CT ANGIOGRAPHY HEAD  TECHNIQUE: Multidetector CT imaging of the head was performed using the standard protocol during bolus administration of intravenous contrast. Multiplanar CT image reconstructions including MIPs were obtained to evaluate the vascular anatomy.  CONTRAST:  163mL OMNIPAQUE IOHEXOL 350 MG/ML SOLN  COMPARISON:  CT of the head June 21, 2013 at 2015 hr.  FINDINGS: Anterior circulation: Normal appearance of the included cervical internal carotid artery, the petrous, cavernous and supra clinoid internal carotid arteries. 2 mm calcified plaque along the undersurface of right cavernous carotid artery. Additional trace calcific atherosclerosis of the carotid siphons. The right carotid terminus is anatomically superior in positioning to the left, which could account for noncontrast CT appearance. Normal appearance of the anterior and middle cerebral arteries, including distal segments. Mild dolichoectasia may reflect chronic hypertension.   Posterior circulation: Right vertebral artery is dominant, mild irregularity of the left vertebral artery with up to 50% narrowing, however the left vertebral artery predominantly terminates in the left posterior inferior cerebellar artery. Basilar artery and main branch vessels are unremarkable. Robust right posterior communicating artery present. Normal appearance of the posterior cerebral arteries.  No large vessel occlusion, aneurysm, high-grade stenosis.  Expansile sellar/ suprasellar mass intermediate density (with 1-2 mm suprasellar extent) measures at least 13 x 13 mm, scalloping and expanding the sella turcica, without bony erosion. Review of the MIP images confirms the above findings.  IMPRESSION: No aneurysm. Dolicoectatic appearance of the intracranial vessels favors chronic hypertension. Mild irregularity of the left vertebral artery may reflect atherosclerosis though, left vertebral artery predominately terminates in the left PICA.  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   Electronically Signed   By: Elon Alas   On: 06/21/2013 23:38   Ct Head Wo Contrast  06/21/2013   CLINICAL DATA:  Altered mental status  EXAM: CT HEAD WITHOUT CONTRAST  TECHNIQUE: Contiguous axial images were obtained from the base of the skull through the vertex without intravenous contrast. Study was obtained within 24 hr of patient's arrival at the emergency department.  COMPARISON:  None.  FINDINGS: There is mild diffuse atrophy. There is no mass, hemorrhage, extra-axial fluid collection, or midline shift. There is mild patchy small vessel disease in the centra semiovale bilaterally. Elsewhere gray-white compartments appear normal. There is no demonstrable acute infarct.  There is a questionable aneurysm arising on the right just superior to the sella measuring 9 by 7 mm. It is best seen on axial  slice 15.  Bony calvarium appears intact.  The mastoid air cells are clear.  IMPRESSION: Mild atrophy with patchy small vessel disease in the centra semiovale bilaterally. No intracranial mass, hemorrhage, or acute appearing infarct. Suspect aneurysm on the right just superior to the sella ; CT or MR angiography to further assess would be advisable.   Electronically Signed   By: Lowella Grip M.D.   On: 06/21/2013 20:26   Dg Chest Port 1 View  06/21/2013   CLINICAL DATA:  Status post intubation.  EXAM: PORTABLE CHEST - 1 VIEW  COMPARISON:  06/21/2013  FINDINGS: Endotracheal tube extends into the left mainstem bronchus. There are patchy lung densities which probably represent edema. Heart size is enlarged. No evidence for a pneumothorax.  IMPRESSION: Endotracheal tube in the left mainstem bronchus.  Patchy interstitial densities suggest edema.  Cardiomegaly.  These results were called by telephone at the time of interpretation on 06/21/2013 at 8:40 PM to Dr. Shea Evans, Aline Brochure , who verbally acknowledged these results.   Electronically Signed   By: Markus Daft M.D.   On: 06/21/2013 20:41   Dg Chest Port 1 View  06/21/2013   CLINICAL DATA:  Chest pain.  EXAM: PORTABLE CHEST - 1 VIEW  COMPARISON:  04/12/2010  FINDINGS: There are diffuse densities on both sides of the chest. Slightly low lung volumes. Heart size is upper limits of normal and likely accentuated by the technique.  IMPRESSION: Bilateral lung densities. Lung densities may be related to low lung volumes and technique but cannot exclude pulmonary edema.   Electronically Signed   By: Markus Daft M.D.   On: 06/21/2013 18:56     Assessment/Plan: 63 year old male presenting with altered mental status and seizure.  Patient has no history of seizure.  Blood pressure elevated with initial MAP greater than 180.  Head CT reviewed along with CTA and is significant for a suprasellar mass.  No hemorrhage noted.  No large area of infarct noted.  Neurological  examination, although limited, at this time shows no significant focality.  Blood glucose and white  Blood cell count elevated.   With seizure and elevated blood pressure, hypertensive encephalopathy/PRES is in the differential.  White blood cell count elevated but this may very well be related to recent seizure activity.  Patient is not febrile.    Recommendations: 1.  Repeat CBC in AM 2.  Agree with Acyclovir 3.  EEG in AM 4.  MRI of the brain without contrast 5.  Keppra to be loaded with 1000mg  IV and maintenance of 500mg  IV q 12 hours 6.  Ativan prn seizure activity.   7.  Seizure precautions 8.  BP control  Alexis Goodell, MD [facility] [phone] 06/22/2013, 5:50 AM

## 2013-06-22 NOTE — Progress Notes (Signed)
Dr. Alva Garnet paged and made aware of patient's six consecutive blood sugars less than 180 and insulin drip at 0.8 units/hr. Will transition to stage 3 of hyperglycemia protocol. Sandre Kitty

## 2013-06-22 NOTE — Consult Note (Addendum)
WOC wound consult note Reason for Consult:Consult requested for BLE.  Pt is intubated and unable to discuss wounds; no family members present in room. Wound type: Chronic full thickness stasis ulcers to left and right outer calf areas of legs. Measurement: Left leg 3X2X.2cm; 100% red and moist, mod amt yellow drainage, no odor. Right leg 3X5X.3cm; 100% red and moist, mod amt yellow drainage, no odor. Periwound: White macerated wound edges, appearance consistent with continuous moisture. Dressing procedure/placement/frequency: Aquacel to absorb drainage and provide antimicrobial benefits. Please re-consult if further assistance is needed.  Thank-you,  Julien Girt MSN, [place], [place], [place], [address]

## 2013-06-22 NOTE — ED Notes (Signed)
Report called to [phone] and given to Gennaro Africa, RRT, RCP.

## 2013-06-22 NOTE — H&P (Signed)
Name: Blake Bell MRN: [id number] DOB: [birth year]    ADMISSION DATE:  06/21/2013 CONSULTATION DATE:  06/21/13  REFERRING MD :  EDP PRIMARY SERVICE: PCCM  CHIEF COMPLAINT:  Seziures  BRIEF PATIENT DESCRIPTION: 63 yr old HTN, OSA, Seizures, requiring ETT  SIGNIFICANT EVENTS / STUDIES:  1/5 ct head>>>Mild atrophy with patchy small vessel disease in the centra semiovale bilaterally. No intracranial mass, hemorrhage, or acute appearing infarct. Suspect aneurysm on the right just superior to the sella  1/5 ct head ANGIO >>>No aneurysm. Dolicoectatic appearance of the intracranial vessels favors chronic hypertension. Mild irregularity of the left vertebral artery may reflect atherosclerosis though, left vertebral artery predominately terminates in the left PICA. 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 1/5 2 Seizures in ED, ETT placed, HTN  LINES / TUBES: 1/5 ETT>>>  CULTURES: 1/5 BC>>>  ANTIBIOTICS: Acyclovir 1/5>>>  HISTORY OF PRESENT ILLNESS:  62 limited history available, HTN, was in normal state health in am , called family, described not feeling well. Then hours later, found unresponsive in car. To ER [facility], combative, seizures x 2, ETT placed. CT head neg aneurysm, HTN noted. No fevers. No neck pain, no headache reported. Limited history however.  PAST MEDICAL HISTORY :  Past Medical History  Diagnosis Date  . Hypertension   . Diabetes mellitus without complication   . Sleep apnea     uses cpap, setting of 5  . Chronic kidney disease     ssees dr Florene Glen nephrology every 6-9 months  . Multiple wounds     on lower legs - sees [name] Pain at the [place]   Past Surgical History  Procedure Laterality Date  . Colonscopy  6-7 yrs ago  . Colonoscopy N/A 04/05/2013    Procedure: COLONOSCOPY;  Surgeon: Juanita Craver, MD;  Location: [facility] ENDOSCOPY;   Service: Endoscopy;  Laterality: N/A;   Prior to Admission medications   Medication Sig Start Date End Date Taking? Authorizing Provider  amLODipine (NORVASC) 10 MG tablet Take 10 mg by mouth daily.   Yes Historical Provider, MD  carvedilol (COREG) 6.25 MG tablet Take 6.25 mg by mouth 2 (two) times daily with a meal.   Yes Historical Provider, MD  cloNIDine (CATAPRES) 0.2 MG tablet Take 0.2 mg by mouth daily.   Yes Historical Provider, MD  enalapril (VASOTEC) 20 MG tablet Take 40 mg by mouth every evening.    Yes Historical Provider, MD  furosemide (LASIX) 80 MG tablet Take 80 mg by mouth daily.    Yes Historical Provider, MD  glyBURIDE-metformin (GLUCOVANCE) 2.5-500 MG per tablet Take 2 tablets by mouth 2 (two) times daily with a meal.    Yes Historical Provider, MD  losartan (COZAAR) 100 MG tablet Take 100 mg by mouth daily.    Yes Historical Provider, MD  penicillin v potassium (VEETID) 500 MG tablet Take 500 mg by mouth 2 (two) times daily. continuous 01/17/13  Yes Historical Provider, MD  potassium chloride SA (K-DUR,KLOR-CON) 20 MEQ tablet Take 40 mEq by mouth 2 (two) times daily.    Yes Historical Provider, MD  spironolactone (ALDACTONE) 50 MG tablet Take 50 mg by mouth daily.   Yes Historical Provider, MD   No Known Allergies  FAMILY HISTORY:  Family History  Problem Relation Age of Onset  . Diabetes Mother   . Diabetes Father   . Diabetes Sister   . Diabetes  Brother   . Hypertension Mother   . Hypertension Father    SOCIAL HISTORY:  reports that he quit smoking about 43 years ago. His smoking use included Cigarettes. He has a .5 pack-year smoking history. He has never used smokeless tobacco. He reports that he does not drink alcohol or use illicit drugs.  REVIEW OF SYSTEMS:  Unable to obtain, vented, sedated  SUBJECTIVE:  seizures x 2, ETT placed  VITAL SIGNS: Temp:  [98.3 F (36.8 C)] 98.3 F (36.8 C) (01/05 2135) Pulse Rate:  [73-114] 73 (01/06 0015) Resp:  [18-34] 18  (01/06 0015) BP: (169-214)/(93-148) 169/94 mmHg (01/06 0015) SpO2:  [94 %-100 %] 98 % (01/06 0015) FiO2 (%):  [30 %-100 %] 30 % (01/05 2309) Weight:  [183.9 kg (405 lb 6.8 oz)] 183.9 kg (405 lb 6.8 oz) (01/05 1934) HEMODYNAMICS:   VENTILATOR SETTINGS: Vent Mode:  [-] PRVC FiO2 (%):  [30 %-100 %] 30 % Set Rate:  [14 bmp-18 bmp] 18 bmp Vt Set:  RW:212346 mL] 620 mL PEEP:  [5 cmH20] 5 cmH20 Plateau Pressure:  [26 cmH20-28 cmH20] 28 cmH20 INTAKE / OUTPUT: Intake/Output   None     PHYSICAL EXAMINATION: General: heavily sedated, rass deep Neuro:  perrl 2 mm, rass -4 HEENT:  Perr, ett, obese Cardiovascular:  s1 s2 RRR distant Lungs:  CTA Abdomen:  Obese, NT , nd , nor /g Musculoskeletal:  Chronic edema nonpitting Skin:  No rash  LABS:  CBC  Recent Labs Lab 06/21/13 1750  WBC 12.3*  HGB 14.2  HCT 44.1  PLT 440*   Coag's  Recent Labs Lab 06/21/13 1750  INR 0.91   BMET  Recent Labs Lab 06/21/13 1750  NA 144  K 2.8*  CL 97  CO2 26  BUN 25*  CREATININE 1.86*  GLUCOSE 508*   Electrolytes  Recent Labs Lab 06/21/13 1750  CALCIUM 9.7   Sepsis Markers  Recent Labs Lab 06/21/13 2224  LATICACIDVEN 1.39   ABG  Recent Labs Lab 06/21/13 1942 06/21/13 2215  PHART 7.262* 7.362  PCO2ART 59.8* 48.1*  PO2ART 328.0* 85.6   Liver Enzymes  Recent Labs Lab 06/21/13 1750  AST 56*  ALT 42  ALKPHOS 161*  BILITOT <0.2*  ALBUMIN 2.8*   Cardiac Enzymes  Recent Labs Lab 06/21/13 1750  TROPONINI <0.30   Glucose  Recent Labs Lab 06/21/13 1743 06/21/13 2210  GLUCAP 520* 450*    Imaging Ct Angio Head W/cm &/or Wo Cm  06/21/2013   CLINICAL DATA:  Unresponsive, intubated.  Possible aneurysm.  EXAM: CT ANGIOGRAPHY HEAD  TECHNIQUE: Multidetector CT imaging of the head was performed using the standard protocol during bolus administration of intravenous contrast. Multiplanar CT image reconstructions including MIPs were obtained to evaluate the vascular  anatomy.  CONTRAST:  143mL OMNIPAQUE IOHEXOL 350 MG/ML SOLN  COMPARISON:  CT of the head June 21, 2013 at 2015 hr.  FINDINGS: Anterior circulation: Normal appearance of the included cervical internal carotid artery, the petrous, cavernous and supra clinoid internal carotid arteries. 2 mm calcified plaque along the undersurface of right cavernous carotid artery. Additional trace calcific atherosclerosis of the carotid siphons. The right carotid terminus is anatomically superior in positioning to the left, which could account for noncontrast CT appearance. Normal appearance of the anterior and middle cerebral arteries, including distal segments. Mild dolichoectasia may reflect chronic hypertension.  Posterior circulation: Right vertebral artery is dominant, mild irregularity of the left vertebral artery with up to 50% narrowing, however the left vertebral  artery predominantly terminates in the left posterior inferior cerebellar artery. Basilar artery and main branch vessels are unremarkable. Robust right posterior communicating artery present. Normal appearance of the posterior cerebral arteries.  No large vessel occlusion, aneurysm, high-grade stenosis.  Expansile sellar/ suprasellar mass intermediate density (with 1-2 mm suprasellar extent) measures at least 13 x 13 mm, scalloping and expanding the sella turcica, without bony erosion. Review of the MIP images confirms the above findings.  IMPRESSION: No aneurysm. Dolicoectatic appearance of the intracranial vessels favors chronic hypertension. Mild irregularity of the left vertebral artery may reflect atherosclerosis though, left vertebral artery predominately terminates in the left PICA.  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   Electronically Signed   By: Elon Alas   On: 06/21/2013 23:38   Ct Head Wo Contrast  06/21/2013    CLINICAL DATA:  Altered mental status  EXAM: CT HEAD WITHOUT CONTRAST  TECHNIQUE: Contiguous axial images were obtained from the base of the skull through the vertex without intravenous contrast. Study was obtained within 24 hr of patient's arrival at the emergency department.  COMPARISON:  None.  FINDINGS: There is mild diffuse atrophy. There is no mass, hemorrhage, extra-axial fluid collection, or midline shift. There is mild patchy small vessel disease in the centra semiovale bilaterally. Elsewhere gray-white compartments appear normal. There is no demonstrable acute infarct.  There is a questionable aneurysm arising on the right just superior to the sella measuring 9 by 7 mm. It is best seen on axial slice 15.  Bony calvarium appears intact.  The mastoid air cells are clear.  IMPRESSION: Mild atrophy with patchy small vessel disease in the centra semiovale bilaterally. No intracranial mass, hemorrhage, or acute appearing infarct. Suspect aneurysm on the right just superior to the sella ; CT or MR angiography to further assess would be advisable.   Electronically Signed   By: Lowella Grip M.D.   On: 06/21/2013 20:26   Dg Chest Port 1 View  06/21/2013   CLINICAL DATA:  Status post intubation.  EXAM: PORTABLE CHEST - 1 VIEW  COMPARISON:  06/21/2013  FINDINGS: Endotracheal tube extends into the left mainstem bronchus. There are patchy lung densities which probably represent edema. Heart size is enlarged. No evidence for a pneumothorax.  IMPRESSION: Endotracheal tube in the left mainstem bronchus.  Patchy interstitial densities suggest edema.  Cardiomegaly.  These results were called by telephone at the time of interpretation on 06/21/2013 at 8:40 PM to Dr. Shea Evans, Aline Brochure , who verbally acknowledged these results.   Electronically Signed   By: Markus Daft M.D.   On: 06/21/2013 20:41   Dg Chest Port 1 View  06/21/2013   CLINICAL DATA:  Chest pain.  EXAM: PORTABLE CHEST - 1 VIEW  COMPARISON:  04/12/2010   FINDINGS: There are diffuse densities on both sides of the chest. Slightly low lung volumes. Heart size is upper limits of normal and likely accentuated by the technique.  IMPRESSION: Bilateral lung densities. Lung densities may be related to low lung volumes and technique but cannot exclude pulmonary edema.   Electronically Signed   By: Markus Daft M.D.   On: 06/21/2013 18:56     CXR: mainstem ETT, int edema, cardiomegally  ASSESSMENT / PLAN:  PULMONARY A:Acute respiratory failure, Edema, OSA ( cpap dep) P:   8 cc/kg, 30% peep 5, abg reviewed, maintain current MV likely needs neg balance  further, re eval for am pcxr in am   CARDIOVASCULAR A: HTN Crisis, r/o ischemia, r/o cardiomyopathy (likely) P:  Reduce MAP by 25% from highest MAP on admission = 115-125 MAP goal Trop  Echo Currently at goals with propofol Add nicardipine if worsen  Add home clonidine to avoid rebound Consider home norvasc in am  No ACEI   RENAL A:  CRI, hypokalemia, pulm edema P:   Ensure K given in ED Repeat chem kvo Lasix likely required Renal US, consider doppler for RAS in future Hold ACEI Lasix dep, likely needs to start in am  GASTROINTESTINAL A:  Obese P:   Npo lft ppi  HEMATOLOGIC A: DVT prevention P:  Sub q hep Avoid Lovenox with current GFR  INFECTIOUS A:  No clear evidence infection, Unlikely HSV with metabolic presentation P:   If fever, add ceftriaxone, vanc, amp Consider empiric acyclovir and LP if not improved or MRI suggestive  ENDOCRINE A: DM, Hyperglcyemia   P:   Insulin drip cbg tsh  NEUROLOGIC A:  New Onset Seizures, Likely HTN enceph, r/o PRES, unlikely HSV / meningitis P:   MAP goals 115-125 MRI brain Neurology Continue keppra EEG Empiric acyclovir , low threshold dc after clinical course noted seizure precaution Neuro consult called and discussed plan, will prefer to cone for further treatment  TODAY'S SUMMARY: New seizures, HTN, vented, PRES? Needs  MRI, control BP, neurology consult, EEG  I have personally obtained a history, examined the patient, evaluated laboratory and imaging results, formulated the assessment and plan and placed orders. CRITICAL CARE: The patient is critically ill with multiple organ systems failure and requires high complexity decision making for assessment and support, frequent evaluation and titration of therapies, application of advanced monitoring technologies and extensive interpretation of multiple databases. Critical Care Time devoted to patient care services described in this note is 40 minutes.   Lavon Paganini. Titus Mould, MD, [place] Pgr: [facility]  Pulmonary and [place] Pager: [phone]  06/22/2013, 12:45 AM

## 2013-06-22 NOTE — Progress Notes (Signed)
UR completed.  Cathi Hazan, RN BSN MHA CCM Trauma/Neuro ICU Case Manager [phone]  

## 2013-06-22 NOTE — Procedures (Signed)
EEG report.  Brief clinical history:  63 year old male presenting with altered mental status and seizure. Patient has no history of seizure  Technique: this is a 17 channel routine scalp EEG performed at the bedside with bipolar and monopolar montages arranged in accordance to the international 10/20 system of electrode placement. One channel was dedicated to EKG recording.  No activating procedures peformed.  Description: the study is contaminated by significant amount of EKG artifact, but for the most part the patient seems to be asleep and there is normal sleep architecture. No electrographic seizures. No focal or generalized epileptiform discharges noted.  EKG showed sinus rhythm.  Impression: this is a normal asleep EEG. Please, be aware that a normal EEG does not exclude the possibility of epilepsy.  Clinical correlation is advised.  Dorian Pod, MD

## 2013-06-23 ENCOUNTER — Encounter (HOSPITAL_BASED_OUTPATIENT_CLINIC_OR_DEPARTMENT_OTHER): Payer: BC Managed Care – PPO | Attending: General Surgery

## 2013-06-23 ENCOUNTER — Inpatient Hospital Stay (HOSPITAL_COMMUNITY): Payer: BC Managed Care – PPO

## 2013-06-23 DIAGNOSIS — R221 Localized swelling, mass and lump, neck: Secondary | ICD-10-CM

## 2013-06-23 DIAGNOSIS — J9600 Acute respiratory failure, unspecified whether with hypoxia or hypercapnia: Secondary | ICD-10-CM

## 2013-06-23 DIAGNOSIS — R220 Localized swelling, mass and lump, head: Secondary | ICD-10-CM

## 2013-06-23 DIAGNOSIS — G4733 Obstructive sleep apnea (adult) (pediatric): Secondary | ICD-10-CM

## 2013-06-23 DIAGNOSIS — R569 Unspecified convulsions: Secondary | ICD-10-CM

## 2013-06-23 DIAGNOSIS — N183 Chronic kidney disease, stage 3 unspecified: Secondary | ICD-10-CM

## 2013-06-23 DIAGNOSIS — R0989 Other specified symptoms and signs involving the circulatory and respiratory systems: Secondary | ICD-10-CM

## 2013-06-23 DIAGNOSIS — R0609 Other forms of dyspnea: Secondary | ICD-10-CM

## 2013-06-23 DIAGNOSIS — I10 Essential (primary) hypertension: Secondary | ICD-10-CM

## 2013-06-23 DIAGNOSIS — R4182 Altered mental status, unspecified: Secondary | ICD-10-CM

## 2013-06-23 LAB — CBC
HCT: 35.7 % — ABNORMAL LOW (ref 39.0–52.0)
Hemoglobin: 11.4 g/dL — ABNORMAL LOW (ref 13.0–17.0)
MCH: 28.9 pg (ref 26.0–34.0)
MCHC: 31.9 g/dL (ref 30.0–36.0)
MCV: 90.4 fL (ref 78.0–100.0)
Platelets: 312 10*3/uL (ref 150–400)
RBC: 3.95 MIL/uL — AB (ref 4.22–5.81)
RDW: 16.9 % — AB (ref 11.5–15.5)
WBC: 13.4 10*3/uL — ABNORMAL HIGH (ref 4.0–10.5)

## 2013-06-23 LAB — BASIC METABOLIC PANEL
BUN: 37 mg/dL — AB (ref 6–23)
CO2: 27 mEq/L (ref 19–32)
CREATININE: 1.88 mg/dL — AB (ref 0.50–1.35)
Calcium: 8.7 mg/dL (ref 8.4–10.5)
Chloride: 111 mEq/L (ref 96–112)
GFR calc non Af Amer: 37 mL/min — ABNORMAL LOW (ref >90)
GFR, EST AFRICAN AMERICAN: 43 mL/min — AB (ref >90)
Glucose, Bld: 159 mg/dL — ABNORMAL HIGH (ref 70–99)
POTASSIUM: 3.4 meq/L — AB (ref 3.7–5.3)
Sodium: 149 mEq/L — ABNORMAL HIGH (ref 137–147)

## 2013-06-23 LAB — GLUCOSE, CAPILLARY
GLUCOSE-CAPILLARY: 143 mg/dL — AB (ref 70–99)
GLUCOSE-CAPILLARY: 171 mg/dL — AB (ref 70–99)
GLUCOSE-CAPILLARY: 195 mg/dL — AB (ref 70–99)
GLUCOSE-CAPILLARY: 198 mg/dL — AB (ref 70–99)
Glucose-Capillary: 126 mg/dL — ABNORMAL HIGH (ref 70–99)
Glucose-Capillary: 137 mg/dL — ABNORMAL HIGH (ref 70–99)
Glucose-Capillary: 156 mg/dL — ABNORMAL HIGH (ref 70–99)
Glucose-Capillary: 189 mg/dL — ABNORMAL HIGH (ref 70–99)
Glucose-Capillary: 196 mg/dL — ABNORMAL HIGH (ref 70–99)
Glucose-Capillary: 169 mg/dL — ABNORMAL HIGH (ref 70–99)

## 2013-06-23 LAB — COMPREHENSIVE METABOLIC PANEL
ALBUMIN: 1.9 g/dL — AB (ref 3.5–5.2)
ALT: 19 U/L (ref 0–53)
AST: 12 U/L (ref 0–37)
Alkaline Phosphatase: 100 U/L (ref 39–117)
BUN: 30 mg/dL — ABNORMAL HIGH (ref 6–23)
CHLORIDE: 112 meq/L (ref 96–112)
CO2: 24 meq/L (ref 19–32)
CREATININE: 2.04 mg/dL — AB (ref 0.50–1.35)
Calcium: 8.2 mg/dL — ABNORMAL LOW (ref 8.4–10.5)
GFR calc Af Amer: 39 mL/min — ABNORMAL LOW (ref >90)
GFR, EST NON AFRICAN AMERICAN: 33 mL/min — AB (ref >90)
Glucose, Bld: 181 mg/dL — ABNORMAL HIGH (ref 70–99)
Potassium: 3.2 mEq/L — ABNORMAL LOW (ref 3.7–5.3)
Sodium: 150 mEq/L — ABNORMAL HIGH (ref 137–147)
Total Protein: 5.9 g/dL — ABNORMAL LOW (ref 6.0–8.3)

## 2013-06-23 LAB — MAGNESIUM: MAGNESIUM: 1.6 mg/dL (ref 1.5–2.5)

## 2013-06-23 LAB — PHOSPHORUS: Phosphorus: 3.6 mg/dL (ref 2.3–4.6)

## 2013-06-23 MED ORDER — SODIUM CHLORIDE 0.9 % IJ SOLN
10.0000 mL | Freq: Two times a day (BID) | INTRAMUSCULAR | Status: DC
  Administered 2013-06-23 – 2013-06-25 (×4): 10 mL
  Administered 2013-06-25: 20 mL
  Administered 2013-06-26 – 2013-06-27 (×4): 10 mL
  Administered 2013-06-28: 20 mL
  Administered 2013-06-28 – 2013-06-30 (×5): 10 mL
  Administered 2013-07-01: 30 mL
  Administered 2013-07-01 – 2013-07-02 (×2): 10 mL

## 2013-06-23 MED ORDER — POTASSIUM CHLORIDE 20 MEQ/15ML (10%) PO LIQD
40.0000 meq | ORAL | Status: AC
  Administered 2013-06-23 (×2): 40 meq
  Filled 2013-06-23 (×2): qty 30

## 2013-06-23 MED ORDER — SODIUM CHLORIDE 0.9 % IJ SOLN: 10.0000 mL | INTRAMUSCULAR | Status: DC | PRN

## 2013-06-23 NOTE — Progress Notes (Signed)
eLink Physician-Brief Progress Note Patient Name: Blake Bell DOB: 1950-12-24 MRN: [id number]  Date of Service  06/23/2013   HPI/Events of Note  hypokalemia  eICU Interventions  Potassium replaced   Intervention Category Intermediate Interventions: Electrolyte abnormality - evaluation and management  Delando Satter 06/23/2013, 4:53 AM

## 2013-06-23 NOTE — Progress Notes (Signed)
Patient experienced a 7 beat run of V.tach with more frequent PVCs. Blood pressure remains stable and no other new changes noted.[place] notified. BMP sent to lab. Will continue to monitor. Sandre Kitty

## 2013-06-23 NOTE — Progress Notes (Signed)
Upon suctioning patient I noticed there were tan secretions in the ETT inline suction tubing that were not there earlier on in the evening. Patient coughing but maintaining saturation level after suctioning. Dr. Jimmy Footman paged and made aware. Tube feed are held until reevaluation in the morning due to potential aspiration of tube feeds. Will get chest x-ray in the morning. Will continue to monitor. Sandre Kitty

## 2013-06-23 NOTE — Progress Notes (Signed)
Name: Blake Bell MRN: [id number] DOB: [birth year]    ADMISSION DATE:  06/21/2013  REFERRING MD :  EDP  CHIEF COMPLAINT: Altered mental status  BRIEF PATIENT DESCRIPTION:  63 yo male with altered mental status and seizures.  Intubated for airway protection.  BP in ER 214/148.  PCCM asked to admit to ICU.  SIGNIFICANT EVENTS: 1/5 Admit, neurology consulted 1/6 Off insulin gtt 1/7 Possible aspiration of tube feeds  STUDIES:  1/5 CT head >> mild atrophy with patchy small vessel disease, ?aneurysm Rt superior to sella 1/5 CT angio head >> no aneurysm, 13 mm mass Rt sella/suprasellar region 1/6 Echo >>  1/6 EEG >> normal sleep EEG  LINES / TUBES: 1/5 ETT>>>  CULTURES: 1/5 BC>>> 1/5 MRSA >> positive  ANTIBIOTICS: Acyclovir 1/5>>>  SUBJECTIVE:  Possibly aspirated tube feeds overnight.  Did not tolerate SBT >> low Vt, high RR.  VITAL SIGNS: Temp:  [97.1 F (36.2 C)-100 F (37.8 C)] 97.1 F (36.2 C) (01/07 0700) Pulse Rate:  [69-98] 71 (01/07 0800) Resp:  [0-28] 14 (01/07 0800) BP: (118-204)/(68-117) 152/90 mmHg (01/07 0800) SpO2:  [92 %-99 %] 97 % (01/07 0800) FiO2 (%):  [30 %] 30 % (01/07 0424) Weight:  [405 lb 6.8 oz (183.9 kg)] 405 lb 6.8 oz (183.9 kg) (01/07 0800) VENTILATOR SETTINGS: Vent Mode:  [-] PRVC FiO2 (%):  [30 %] 30 % Set Rate:  [14 bmp] 14 bmp Vt Set:  [620 mL] 620 mL PEEP:  [5 cmH20] 5 cmH20 Plateau Pressure:  [24 cmH20-28 cmH20] 28 cmH20 INTAKE / OUTPUT: Intake/Output     01/06 0701 - 01/07 0700 01/07 0701 - 01/08 0700   I.V. (mL/kg) 1090.7 (5.9) 64.2 (0.3)   Other 130    NG/GT 165    IV Piggyback 706.5    Total Intake(mL/kg) 2092.2 (11.4) 64.2 (0.3)   Urine (mL/kg/hr) 1440 (0.3) 90 (0.3)   Total Output 1440 90   Net +652.2 -25.8          PHYSICAL EXAMINATION: General: No distress Neuro: moves extremities with WUA, not following commands HEENT: ETT in place Cardiovascular: regular, no murmur Lungs: decreased breath sounds, no  wheeze, scattered rhonchi Abdomen: obese , soft, non tender Musculoskeletal: 1+ non pitting edema Skin: chronic venous stasis changes  LABS: CBC  Recent Labs Lab 06/22/13 0232 06/22/13 0905 06/23/13 0240  WBC 11.6* 11.4* 13.4*  HGB 11.7* 12.4* 11.4*  HCT 35.4* 37.6* 35.7*  PLT 340 323 312   Coag's  Recent Labs Lab 06/21/13 1750  INR 0.91   BMET  Recent Labs Lab 06/22/13 0905 06/22/13 2025 06/23/13 0240  NA 145 146 150*  K 2.8* 5.4* 3.2*  CL 105 110 112  CO2 26 21 24   BUN 25* 27* 30*  CREATININE 1.70* 1.75* 2.04*  GLUCOSE 183* 123* 181*   Electrolytes  Recent Labs Lab 06/22/13 0905 06/22/13 2025 06/23/13 0240  CALCIUM 8.2* 8.4 8.2*  MG 1.5  --  1.6  PHOS 2.7  --  3.6   Sepsis Markers  Recent Labs Lab 06/21/13 2224  LATICACIDVEN 1.39   ABG  Recent Labs Lab 06/21/13 1942 06/21/13 2215 06/22/13 0522  PHART 7.262* 7.362 7.419  PCO2ART 59.8* 48.1* 38.0  PO2ART 328.0* 85.6 62.7*   Liver Enzymes  Recent Labs Lab 06/21/13 1750 06/23/13 0240  AST 56* 12  ALT 42 19  ALKPHOS 161* 100  BILITOT <0.2* <0.2*  ALBUMIN 2.8* 1.9*   Cardiac Enzymes  Recent Labs Lab 06/21/13 1750 06/22/13  Elliott <0.30 <0.30   Glucose  Recent Labs Lab 06/22/13 2203 06/22/13 2258 06/22/13 2350 06/23/13 0028 06/23/13 0410 06/23/13 0752  GLUCAP 137* 156* 189* 195* 171* 196*    Imaging Ct Angio Head W/cm &/or Wo Cm  06/21/2013   CLINICAL DATA:  Unresponsive, intubated.  Possible aneurysm.  EXAM: CT ANGIOGRAPHY HEAD  TECHNIQUE: Multidetector CT imaging of the head was performed using the standard protocol during bolus administration of intravenous contrast. Multiplanar CT image reconstructions including MIPs were obtained to evaluate the vascular anatomy.  CONTRAST:  138mL OMNIPAQUE IOHEXOL 350 MG/ML SOLN  COMPARISON:  CT of the head June 21, 2013 at 2015 hr.  FINDINGS: Anterior circulation: Normal appearance of the included cervical internal  carotid artery, the petrous, cavernous and supra clinoid internal carotid arteries. 2 mm calcified plaque along the undersurface of right cavernous carotid artery. Additional trace calcific atherosclerosis of the carotid siphons. The right carotid terminus is anatomically superior in positioning to the left, which could account for noncontrast CT appearance. Normal appearance of the anterior and middle cerebral arteries, including distal segments. Mild dolichoectasia may reflect chronic hypertension.  Posterior circulation: Right vertebral artery is dominant, mild irregularity of the left vertebral artery with up to 50% narrowing, however the left vertebral artery predominantly terminates in the left posterior inferior cerebellar artery. Basilar artery and main branch vessels are unremarkable. Robust right posterior communicating artery present. Normal appearance of the posterior cerebral arteries.  No large vessel occlusion, aneurysm, high-grade stenosis.  Expansile sellar/ suprasellar mass intermediate density (with 1-2 mm suprasellar extent) measures at least 13 x 13 mm, scalloping and expanding the sella turcica, without bony erosion. Review of the MIP images confirms the above findings.  IMPRESSION: No aneurysm. Dolicoectatic appearance of the intracranial vessels favors chronic hypertension. Mild irregularity of the left vertebral artery may reflect atherosclerosis though, left vertebral artery predominately terminates in the left PICA.  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   Electronically Signed   By: Elon Alas   On: 06/21/2013 23:38   Ct Head Wo Contrast  06/21/2013   CLINICAL DATA:  Altered mental status  EXAM: CT HEAD WITHOUT CONTRAST  TECHNIQUE: Contiguous axial images were obtained from the base of the skull through the vertex without intravenous contrast. Study was  obtained within 24 hr of patient's arrival at the emergency department.  COMPARISON:  None.  FINDINGS: There is mild diffuse atrophy. There is no mass, hemorrhage, extra-axial fluid collection, or midline shift. There is mild patchy small vessel disease in the centra semiovale bilaterally. Elsewhere gray-white compartments appear normal. There is no demonstrable acute infarct.  There is a questionable aneurysm arising on the right just superior to the sella measuring 9 by 7 mm. It is best seen on axial slice 15.  Bony calvarium appears intact.  The mastoid air cells are clear.  IMPRESSION: Mild atrophy with patchy small vessel disease in the centra semiovale bilaterally. No intracranial mass, hemorrhage, or acute appearing infarct. Suspect aneurysm on the right just superior to the sella ; CT or MR angiography to further assess would be advisable.   Electronically Signed   By: Lowella Grip M.D.   On: 06/21/2013 20:26   Dg Chest Port 1 View  06/23/2013   CLINICAL DATA:  Evaluate endotracheal tube placement.  EXAM: PORTABLE CHEST - 1 VIEW  COMPARISON:  Chest x-ray 06/21/2013.  FINDINGS: An endotracheal tube is in place with tip 2.2 cm above the carina. A nasogastric tube is seen extending into the stomach, however, the tip of the nasogastric tube extends below the lower margin of the image. Lung volumes are low. Bibasilar opacities (left greater than right) may reflect areas of atelectasis and/or consolidation. Small left pleural effusion. There is cephalization of the pulmonary vasculature, indistinctness of the interstitial markings, and patchy airspace disease throughout the lungs bilaterally suggestive of moderate pulmonary edema. Mild cardiomegaly. The patient is rotated to the left on today's exam, resulting in distortion of the mediastinal contours and reduced diagnostic sensitivity and specificity for mediastinal pathology.  IMPRESSION: 1. Support apparatus, as above. 2. The appearance of the chest  suggests worsening congestive heart failure, as above. 3. Low lung volumes with bibasilar atelectasis and/or consolidation. 4. Small left pleural effusion.   Electronically Signed   By: Vinnie Langton M.D.   On: 06/23/2013 08:05   Dg Chest Port 1 View  06/21/2013   CLINICAL DATA:  Status post intubation.  EXAM: PORTABLE CHEST - 1 VIEW  COMPARISON:  06/21/2013  FINDINGS: Endotracheal tube extends into the left mainstem bronchus. There are patchy lung densities which probably represent edema. Heart size is enlarged. No evidence for a pneumothorax.  IMPRESSION: Endotracheal tube in the left mainstem bronchus.  Patchy interstitial densities suggest edema.  Cardiomegaly.  These results were called by telephone at the time of interpretation on 06/21/2013 at 8:40 PM to Dr. Shea Evans, Aline Brochure , who verbally acknowledged these results.   Electronically Signed   By: Markus Daft M.D.   On: 06/21/2013 20:41   Dg Chest Port 1 View  06/21/2013   CLINICAL DATA:  Chest pain.  EXAM: PORTABLE CHEST - 1 VIEW  COMPARISON:  04/12/2010  FINDINGS: There are diffuse densities on both sides of the chest. Slightly low lung volumes. Heart size is upper limits of normal and likely accentuated by the technique.  IMPRESSION: Bilateral lung densities. Lung densities may be related to low lung volumes and technique but cannot exclude pulmonary edema.   Electronically Signed   By: Markus Daft M.D.   On: 06/21/2013 18:56    ASSESSMENT / PLAN:  PULMONARY A: Acute respiratory failure in setting of encephalopathy and seizures. Hx of OSA >> followed by Dr. Gwenette Greet as outpt. P:   -vent support until neuro status more stable -f/u CXR  CARDIOVASCULAR A:  Malignant HTN. P:  -f/u Echo -goal SBP < 170 -continue amlodipine, carvedilol, clonidine -hold outpt enalapril, cozaar, aldactone, lasix for now in setting of renal insufficiency and hypernatremia  RENAL A:  Stage 3 CKD.  Hypokalemia. P:   -f/u renal fx, urine outpt,  electrolytes  GASTROINTESTINAL A:  Nutrition. Morbid obesity. ?aspiration event 1/6. P:   -NPO -protonix for SUP -hold tube feeds for now >> okay to continue meds via tube  HEMATOLOGIC A:  Mild leukocytosis. P:  -SQ heparin for DVT prevention -f/u CBC  INFECTIOUS A:   ?HSV as cause of altered mental status >> seems less likely. Chronic stasis ulcers of lower extremities c P:   -continue acyclovir per now per neurology >> low threshold to d/c this -aquacel per wound care team  ENDOCRINE A:  DM type II with hyperglycemia. P:   -SSI with lantus -hold outpt glyburide, metformin for now  NEUROLOGIC A:   Acute encephalopathy most likely 2nd to malignant HTN complicated by seizures.  Less likely menigitis/HSV >> unable to do MRI due to pt  body habitus. 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 P:   -keppra per neurology  CC time 35 minutes.  Difficult IV access >> discussed option of central line >> family very reluctant for this and would prefer PICC line placement if possible.  Chesley Mires, MD [facility] Pulmonary/Critical Care 06/23/2013, 8:37 AM Pager:  [phone] After 3pm call: [phone]

## 2013-06-23 NOTE — Progress Notes (Addendum)
Subjective: Patient remains intubated and sedated. No recurrence of seizure activity reported. Unable to undergo MRI because of body habitus.  Objective: Current vital signs: BP 153/92  Pulse 76  Temp(Src) 97.1 F (36.2 C) (Axillary)  Resp 15  Ht 6' (1.829 m)  Wt 183.9 kg (405 lb 6.8 oz)  BMI 54.97 kg/m2  SpO2 100%  Neurologic Exam: Intubated and on mechanical ventilation. Patient has spontaneous respirations as well. Currently sedated with propofol. Minimal response to noxious stimuli noted. Pupils are equal and reacted normally to light. Extraocular movements were minimal with oculocephalic maneuvers. No facial weakness was noted. Muscle tone was flaccid throughout. Patient had no spontaneous movements of extremities and no abnormal posturing. He also had no withdrawal movements to noxious stimuli. Deep tendon reflexes were trace only in the upper extremities and absent in lower extremities. Plantar responses were mute bilaterally.  Medications: I have reviewed the patient's current medications.  Assessment/Plan: 63 year old man with encephalopathic state and generalized seizures, most likely secondary to hypertensive encephalopathy. Patient clinically has no signs of acute stroke. PRES is suspected. Unfortunately patient cannot undergo MRI study because of body habitus. EEG is pending. Significance of sella mass is unclear.  Recommend no changes in current management, including continuing Keppra 500 mg twice a day for seizure prevention, as well as continued intubation for airway protection.  We will continue to follow this patient closely with you.  C.R. Nicole Kindred, MD [facility]  06/23/2013  10:58 AM

## 2013-06-23 NOTE — Progress Notes (Signed)
During 0800 assessment I was able to auscultate placement of OG tube.  No residuals received.  Dr. Halford Chessman also verified placement via auscultation but wanted to continue to hold tube feeds until further evaluation.  He said it was OK to use OG tube for meds.  Will continue to monitor breath sounds for signs of aspiration.

## 2013-06-24 ENCOUNTER — Other Ambulatory Visit: Payer: BC Managed Care – PPO

## 2013-06-24 ENCOUNTER — Inpatient Hospital Stay (HOSPITAL_COMMUNITY): Payer: BC Managed Care – PPO

## 2013-06-24 DIAGNOSIS — E1110 Type 2 diabetes mellitus with ketoacidosis without coma: Secondary | ICD-10-CM

## 2013-06-24 DIAGNOSIS — I517 Cardiomegaly: Secondary | ICD-10-CM

## 2013-06-24 LAB — BASIC METABOLIC PANEL
BUN: 37 mg/dL — ABNORMAL HIGH (ref 6–23)
CHLORIDE: 110 meq/L (ref 96–112)
CO2: 27 mEq/L (ref 19–32)
Calcium: 8.8 mg/dL (ref 8.4–10.5)
Creatinine, Ser: 1.88 mg/dL — ABNORMAL HIGH (ref 0.50–1.35)
GFR calc Af Amer: 43 mL/min — ABNORMAL LOW (ref >90)
GFR calc non Af Amer: 37 mL/min — ABNORMAL LOW (ref >90)
GLUCOSE: 162 mg/dL — AB (ref 70–99)
Potassium: 3.4 mEq/L — ABNORMAL LOW (ref 3.7–5.3)
SODIUM: 148 meq/L — AB (ref 137–147)

## 2013-06-24 LAB — CBC
HEMATOCRIT: 37.1 % — AB (ref 39.0–52.0)
Hemoglobin: 12.1 g/dL — ABNORMAL LOW (ref 13.0–17.0)
MCH: 29.8 pg (ref 26.0–34.0)
MCHC: 32.6 g/dL (ref 30.0–36.0)
MCV: 91.4 fL (ref 78.0–100.0)
Platelets: 324 10*3/uL (ref 150–400)
RBC: 4.06 MIL/uL — ABNORMAL LOW (ref 4.22–5.81)
RDW: 17.4 % — AB (ref 11.5–15.5)
WBC: 11 10*3/uL — AB (ref 4.0–10.5)

## 2013-06-24 LAB — GLUCOSE, CAPILLARY
GLUCOSE-CAPILLARY: 176 mg/dL — AB (ref 70–99)
Glucose-Capillary: 128 mg/dL — ABNORMAL HIGH (ref 70–99)
Glucose-Capillary: 134 mg/dL — ABNORMAL HIGH (ref 70–99)
Glucose-Capillary: 157 mg/dL — ABNORMAL HIGH (ref 70–99)
Glucose-Capillary: 159 mg/dL — ABNORMAL HIGH (ref 70–99)
Glucose-Capillary: 190 mg/dL — ABNORMAL HIGH (ref 70–99)
Glucose-Capillary: 136 mg/dL — ABNORMAL HIGH (ref 70–99)

## 2013-06-24 LAB — MAGNESIUM: MAGNESIUM: 1.8 mg/dL (ref 1.5–2.5)

## 2013-06-24 MED ORDER — HYDRALAZINE HCL 20 MG/ML IJ SOLN
10.0000 mg | INTRAMUSCULAR | Status: DC | PRN
  Administered 2013-06-24 – 2013-06-26 (×6): 20 mg via INTRAVENOUS
  Administered 2013-06-27: 10 mg via INTRAVENOUS
  Administered 2013-06-27 (×3): 20 mg via INTRAVENOUS
  Administered 2013-06-27: 10 mg via INTRAVENOUS
  Administered 2013-07-02 – 2013-07-05 (×5): 20 mg via INTRAVENOUS
  Filled 2013-06-24 (×2): qty 1
  Filled 2013-06-24: qty 2
  Filled 2013-06-24: qty 1
  Filled 2013-06-24: qty 2
  Filled 2013-06-24 (×11): qty 1

## 2013-06-24 NOTE — Progress Notes (Addendum)
Subjective: No recurrent seizure activity reported. Patient appears to be more responsive to external stimuli but no purposeful activity reported.  Objective: Current vital signs: BP 168/101  Pulse 65  Temp(Src) 97.1 F (36.2 C) (Oral)  Resp 14  Ht 6' (1.829 m)  Wt 183.9 kg (405 lb 6.8 oz)  BMI 54.97 kg/m2  SpO2 99%  Neurologic Exam: Intubated and on mechanical ventilation. Patient has spontaneous respirations as well. Propofol drip was suspended more than 1 hour ago and patient has not shown signs of significant agitation. Eyes are open. There is no indication however that patient is conscious. There is no visual tracking. Patient follows no commands. Occasional withdrawal movements of extremities noted with noxious stimuli. Muscle tone was flaccid throughout and unchanged.  Medications: I have reviewed the patient's current medications.  Assessment/Plan: Encephalopathic state most likely secondary to hypertensive encephalopathy and residual effects of DKA. Patient's had no recurrence of seizure activity. Blood pressure control this point. He is tolerating Keppra well with no side effects. Unfortunately MRI study could not be performed because of patient's size and body habitus. EEG on 06/22/2013 showed normal sleep pattern with no epileptic activity reported.  Recommend no changes in current management including continuing [place] for now. We'll plan to obtain a repeat CT scan of the head and one to 2 days if there is no significant improvement in patient's mental status.  C.R. Nicole Kindred, MD [facility] [phone]  06/24/2013  10:26 AM

## 2013-06-24 NOTE — Progress Notes (Signed)
Name: Blake Bell MRN: [id number] DOB: 1950-09-03    ADMISSION DATE:  06/21/2013  REFERRING MD :  EDP  CHIEF COMPLAINT: Altered mental status  BRIEF PATIENT DESCRIPTION:  63 yo male with altered mental status and seizures.  Intubated for airway protection.  BP in ER 214/148.  PCCM asked to admit to ICU.  SIGNIFICANT EVENTS: 1/5 Admit, neurology consulted 1/6 Off insulin gtt 1/7 Possible aspiration of tube feeds  STUDIES:  1/5 CT head >> mild atrophy with patchy small vessel disease, ?aneurysm Rt superior to sella 1/5 CT angio head >> no aneurysm, 13 mm mass Rt sella/suprasellar region 1/6 Echo >>  1/6 EEG >> normal sleep EEG  LINES / TUBES: 1/5 ETT>>> 1/7 Rt PICC>>>  CULTURES: 1/5 BC>>> 1/5 MRSA >> positive  ANTIBIOTICS: Acyclovir 1/5>>>1/8  SUBJECTIVE:  More alert on WUA today.  Did not tolerate SBT >> low Vt, high RR.  VITAL SIGNS: Temp:  [96.9 F (36.1 C)-98.3 F (36.8 C)] 97.1 F (36.2 C) (01/08 0800) Pulse Rate:  [56-77] 65 (01/08 0800) Resp:  [14-28] 15 (01/08 0800) BP: (119-174)/(70-100) 145/100 mmHg (01/08 0800) SpO2:  [96 %-100 %] 97 % (01/08 0800) FiO2 (%):  [30 %] 30 % (01/08 0800) Weight:  [405 lb 6.8 oz (183.9 kg)] 405 lb 6.8 oz (183.9 kg) (01/08 0500) VENTILATOR SETTINGS: Vent Mode:  [-] PRVC FiO2 (%):  [30 %] 30 % Set Rate:  [14 bmp] 14 bmp Vt Set:  [620 mL] 620 mL PEEP:  [5 cmH20] 5 cmH20 Plateau Pressure:  [25 [id number] cmH20] 26 cmH20 INTAKE / OUTPUT: Intake/Output     01/07 0701 - 01/08 0700 01/08 0701 - 01/09 0700   P.O. 160    I.V. (mL/kg) 935.2 (5.1) 42.1 (0.2)   Other     NG/GT     IV Piggyback 700.5    Total Intake(mL/kg) 1795.7 (9.8) 42.1 (0.2)   Urine (mL/kg/hr) 1325 (0.3) 60 (0.1)   Total Output 1325 60   Net +470.7 -17.9          PHYSICAL EXAMINATION: General: No distress Neuro: moves extremities with WUA, not following commands HEENT: ETT in place Cardiovascular: regular, no murmur Lungs: decreased breath  sounds, no wheeze, scattered rhonchi Abdomen: obese , soft, non tender Musculoskeletal: 1+ non pitting edema Skin: chronic venous stasis changes  LABS: CBC  Recent Labs Lab 06/22/13 0905 06/23/13 0240 06/24/13 0530  WBC 11.4* 13.4* 11.0*  HGB 12.4* 11.4* 12.1*  HCT 37.6* 35.7* 37.1*  PLT 323 312 324   Coag's  Recent Labs Lab 06/21/13 1750  INR 0.91   BMET  Recent Labs Lab 06/23/13 0240 06/23/13 2150 06/24/13 0530  NA 150* 149* 148*  K 3.2* 3.4* 3.4*  CL 112 111 110  CO2 24 27 27   BUN 30* 37* 37*  CREATININE 2.04* 1.88* 1.88*  GLUCOSE 181* 159* 162*   Electrolytes  Recent Labs Lab 06/22/13 0905  06/23/13 0240 06/23/13 2150 06/24/13 0530  CALCIUM 8.2*  < > 8.2* 8.7 8.8  MG 1.5  --  1.6  --  1.8  PHOS 2.7  --  3.6  --   --   < > = values in this interval not displayed. Sepsis Markers  Recent Labs Lab 06/21/13 2224  LATICACIDVEN 1.39   ABG  Recent Labs Lab 06/21/13 1942 06/21/13 2215 06/22/13 0522  PHART 7.262* 7.362 7.419  PCO2ART 59.8* 48.1* 38.0  PO2ART 328.0* 85.6 62.7*   Liver Enzymes  Recent Labs Lab 06/21/13 1750  06/23/13 0240  AST 56* 12  ALT 42 19  ALKPHOS 161* 100  BILITOT <0.2* <0.2*  ALBUMIN 2.8* 1.9*   Cardiac Enzymes  Recent Labs Lab 06/21/13 1750 06/22/13 0232  TROPONINI <0.30 <0.30   Glucose  Recent Labs Lab 06/23/13 1218 06/23/13 1625 06/23/13 2022 06/23/13 2359 06/24/13 0329 06/24/13 0750  GLUCAP 198* 169* 143* 134* 128* 157*    Imaging Dg Chest Port 1 View  06/24/2013   CLINICAL DATA:  Evaluate pulmonary infiltrates. Hypertension. Diabetes. Sleep apnea. Morbid obesity.  EXAM: PORTABLE CHEST - 1 VIEW  COMPARISON:  DG CHEST 1V PORT dated 06/23/2013; DG CHEST 1V PORT dated 06/21/2013  FINDINGS: Endotracheal tube terminates 4.4 cm above carina. Nasogastric tube poorly visualized distally but likely extends beyond the inferior aspect of the film.  Cardiomegaly accentuated by AP portable technique. No right  and no definite left pleural effusion. Inferior left hemi thorax poorly evaluated. Low lung volumes. Similar concurrent mild to moderate interstitial edema. Patchy bibasilar airspace disease which is not significantly changed.  IMPRESSION: No significant change since the prior exam.  Interstitial edema, superimposed upon low lung volumes.  Bibasilar Airspace disease, likely atelectasis.   Electronically Signed   By: Abigail Miyamoto M.D.   On: 06/24/2013 07:49   Dg Chest Port 1 View  06/23/2013   CLINICAL DATA:  Evaluate endotracheal tube placement.  EXAM: PORTABLE CHEST - 1 VIEW  COMPARISON:  Chest x-ray 06/21/2013.  FINDINGS: An endotracheal tube is in place with tip 2.2 cm above the carina. A nasogastric tube is seen extending into the stomach, however, the tip of the nasogastric tube extends below the lower margin of the image. Lung volumes are low. Bibasilar opacities (left greater than right) may reflect areas of atelectasis and/or consolidation. Small left pleural effusion. There is cephalization of the pulmonary vasculature, indistinctness of the interstitial markings, and patchy airspace disease throughout the lungs bilaterally suggestive of moderate pulmonary edema. Mild cardiomegaly. The patient is rotated to the left on today's exam, resulting in distortion of the mediastinal contours and reduced diagnostic sensitivity and specificity for mediastinal pathology.  IMPRESSION: 1. Support apparatus, as above. 2. The appearance of the chest suggests worsening congestive heart failure, as above. 3. Low lung volumes with bibasilar atelectasis and/or consolidation. 4. Small left pleural effusion.   Electronically Signed   By: Vinnie Langton M.D.   On: 06/23/2013 08:05    ASSESSMENT / PLAN:  PULMONARY A: Acute respiratory failure in setting of encephalopathy and seizures. Hx of OSA >> followed by Dr. Gwenette Greet as outpt. P:   -pressure support wean as tolerated >> not ready for extubation yet; discussed  possible need for trach with family 1/8 -f/u CXR  CARDIOVASCULAR A:  Malignant HTN. P:  -f/u Echo -goal SBP < 170 -continue amlodipine, carvedilol, clonidine -hold outpt enalapril, cozaar, aldactone, lasix for now in setting of renal insufficiency and hypernatremia  RENAL A:  Acute kidney injury >> improving. Stage 3 CKD.  Hypokalemia. Hypernatremia >> improving. P:   -f/u renal fx, urine outpt, electrolytes  GASTROINTESTINAL A:  Nutrition. Morbid obesity. ?aspiration event 1/6. P:   -NPO -protonix for SUP -resume tube feeds 1/8  HEMATOLOGIC A:  Mild leukocytosis >> improving. P:  -SQ heparin for DVT prevention -f/u CBC intermittently  INFECTIOUS A:   ?HSV as cause of altered mental status >> seems less likely. Chronic stasis ulcers of lower extremities c P:   -d/c acyclovir 1/8 -aquacel per wound care team  ENDOCRINE A:  DM  type II with hyperglycemia. P:   -SSI with lantus -hold outpt glyburide, metformin for now  NEUROLOGIC A:   Acute encephalopathy most likely 2nd to malignant HTN complicated by seizures.  Less likely menigitis/HSV >> unable to do MRI due to pt body habitus. 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 P:   -keppra per neurology  Updated family at bedside.  CC time 35 minutes.  Chesley Mires, MD [facility] Pulmonary/Critical Care 06/24/2013, 9:28 AM Pager:  [phone] After 3pm call: [phone]

## 2013-06-24 NOTE — Progress Notes (Signed)
  Echocardiogram 2D Echocardiogram has been performed.  Mauricio Po 06/24/2013, 6:04 PM

## 2013-06-25 ENCOUNTER — Inpatient Hospital Stay (HOSPITAL_COMMUNITY): Payer: BC Managed Care – PPO

## 2013-06-25 LAB — BASIC METABOLIC PANEL
BUN: 43 mg/dL — AB (ref 6–23)
CHLORIDE: 112 meq/L (ref 96–112)
CO2: 27 mEq/L (ref 19–32)
Calcium: 8.7 mg/dL (ref 8.4–10.5)
Creatinine, Ser: 1.77 mg/dL — ABNORMAL HIGH (ref 0.50–1.35)
GFR calc non Af Amer: 39 mL/min — ABNORMAL LOW (ref >90)
GFR, EST AFRICAN AMERICAN: 46 mL/min — AB (ref >90)
GLUCOSE: 166 mg/dL — AB (ref 70–99)
POTASSIUM: 3.1 meq/L — AB (ref 3.7–5.3)
Sodium: 150 mEq/L — ABNORMAL HIGH (ref 137–147)

## 2013-06-25 LAB — GLUCOSE, CAPILLARY
GLUCOSE-CAPILLARY: 113 mg/dL — AB (ref 70–99)
GLUCOSE-CAPILLARY: 184 mg/dL — AB (ref 70–99)
Glucose-Capillary: 162 mg/dL — ABNORMAL HIGH (ref 70–99)
Glucose-Capillary: 154 mg/dL — ABNORMAL HIGH (ref 70–99)
Glucose-Capillary: 150 mg/dL — ABNORMAL HIGH (ref 70–99)
Glucose-Capillary: 177 mg/dL — ABNORMAL HIGH (ref 70–99)

## 2013-06-25 MED ORDER — POTASSIUM CHLORIDE 20 MEQ/15ML (10%) PO LIQD
40.0000 meq | Freq: Once | ORAL | Status: AC
  Administered 2013-06-25: 40 meq
  Filled 2013-06-25 (×2): qty 30

## 2013-06-25 NOTE — Progress Notes (Signed)
Subjective: Patient was noted to be slightly more responsive to external stimuli with sedation with propofol was suspended. No recurrence of seizure activity reported.  Objective: Current vital signs: BP 183/86  Pulse 81  Temp(Src) 99.4 F (37.4 C) (Axillary)  Resp 19  Ht 6' (1.829 m)  Wt 187.3 kg (412 lb 14.8 oz)  BMI 55.99 kg/m2  SpO2 98%  Neurologic Exam: Patient was intubated and on mechanical ventilation but has spontaneous respirations. He is again sedated with propofol and has only minimal response to external noxious stimuli. Pupils were equal and reacted normally to light. Extraocular movements were intact but sluggish to oculocephalic maneuvers. No facial weakness was noted. Muscle tone was flaccid throughout. Patient had no spontaneous movements and no abnormal posturing.  Medications: I have reviewed the patient's current medications.  Assessment/Plan: Encephalopathy was likely secondary to multiple factors including hypertensive emergency and DKA. Seizures are controlled with Keppra 500 mg twice a day.   Recommend no changes in current management. We will continue to follow this patient with you.  C.R. Nicole Kindred, MD [facility] [phone]  06/25/2013  5:38 PM

## 2013-06-25 NOTE — Progress Notes (Signed)
NUTRITION FOLLOW UP  Intervention:    Continue Vital High Protein @ 15 ml/hr   Continue 60 ml Prostat six times per day  Continue MVI  Tube feeding regimen provides 1560 kcal (12 kcal/kg of actual body weight), 211 grams protein, and 300 ml H2O.  TF regimen and current propofol provides 2161 kcal total kcal per day   Nutrition Dx:   Inadequate oral intake related to inability to eat as evidenced by NPO status; ongoing.   Goal:  Enteral nutrition to provide 60-70% of estimated calorie needs (22-25 kcals/kg ideal body weight) and 100% of estimated protein needs, based on ASPEN guidelines for permissive underfeeding in critically ill obese individuals, NA.   Monitor:  TF initiation and tolerance, weight trend, labs   Assessment:   Pt admitted after being found locked in his car. Pt unresponsive and having seizures.  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  Per RN pt did not tolerate wean of Propofol.  Patient is currently intubated on ventilator support.  MV: 14 L/min Temp (24hrs), Avg:98.2 F (36.8 C), Min:96.3 F (35.7 C), Max:99.4 F (37.4 C)  Propofol: 22.8 ml/hr providing 601 kcal from lipid   Height: Ht Readings from Last 1 Encounters:  06/21/13 6' (1.829 m)    Weight Status:   Wt Readings from Last 1 Encounters:  06/25/13 412 lb 14.8 oz (187.3 kg)  Usual weight 392-395 lb (179.5 kg)  Re-estimated needs:  Kcal: 2898 Protein: >/= 202 grams Fluid: >2.2 L/day  Skin: BLE chronic full thickness wounds on calves   Diet Order:     Intake/Output Summary (Last 24 hours) at 06/25/13 1004 Last data filed at 06/25/13 0800  Gross per 24 hour  Intake 1995.33 ml  Output   1730 ml  Net 265.33 ml    Last BM: no bm since admission per RN    Labs:   Recent Labs Lab 06/21/13 1750  06/22/13 0905  06/23/13 0240 06/23/13 2150 06/24/13 0530 06/25/13 0430  NA 144  --  145  < > 150* 149* 148* 150*   K 2.8*  --  2.8*  < > 3.2* 3.4* 3.4* 3.1*  CL 97  --  105  < > 112 111 110 112  CO2 26  --  26  < > 24 27 27 27   BUN 25*  --  25*  < > 30* 37* 37* 43*  CREATININE 1.86*  < > 1.70*  < > 2.04* 1.88* 1.88* 1.77*  CALCIUM 9.7  --  8.2*  < > 8.2* 8.7 8.8 8.7  MG  --   --  1.5  --  1.6  --  1.8  --   PHOS  --   --  2.7  --  3.6  --   --   --   GLUCOSE 508*  --  183*  < > 181* 159* 162* 166*  < > = values in this interval not displayed.  CBG (last 3)   Recent Labs  06/24/13 2348 06/25/13 0333 06/25/13 0759  GLUCAP 176* 162* 154*   No results found for this basename: HGBA1C   Scheduled Meds: . amLODipine  10 mg Per Tube Daily  . antiseptic oral rinse  15 mL Mouth Rinse q12n4p  . carvedilol  6.25 mg Per Tube BID WC  . chlorhexidine  15 mL Mouth Rinse BID  . Chlorhexidine Gluconate Cloth  6 each Topical Q0600  . cloNIDine  0.2 mg  Per Tube BID  . feeding supplement (PRO-STAT SUGAR FREE 64)  60 mL Per Tube 6 X Daily  . feeding supplement (VITAL HIGH PROTEIN)  1,000 mL Per Tube Q24H  . heparin  5,000 Units Subcutaneous Q8H  . insulin aspart  1 Units Subcutaneous Q4H  . insulin aspart  2-6 Units Subcutaneous Q4H  . insulin glargine  10 Units Subcutaneous Q24H  . levETIRAcetam  500 mg Intravenous Q12H  . multivitamin  5 mL Per Tube Daily  . mupirocin ointment  1 application Nasal BID  . pantoprazole sodium  40 mg Per Tube Q24H  . sodium chloride  10-40 mL Intracatheter Q12H    Continuous Infusions: . dextrose    . propofol 20.029 mcg/kg/min (06/25/13 0700)    [place], [place], CNSC [id number] Pager [phone] After Hours Pager

## 2013-06-25 NOTE — Progress Notes (Signed)
Name: Blake Bell MRN: [id number] DOB: January 31, 1951    ADMISSION DATE:  06/21/2013  REFERRING MD :  EDP  CHIEF COMPLAINT: Altered mental status  BRIEF PATIENT DESCRIPTION:  63 yo male with altered mental status and seizures.  Intubated for airway protection.  BP in ER 214/148.  PCCM asked to admit to ICU.  SIGNIFICANT EVENTS: 1/5 Admit, neurology consulted 1/6 Off insulin gtt 1/7 Possible aspiration of tube feeds  STUDIES:  1/5 CT head >> mild atrophy with patchy small vessel disease, ?aneurysm Rt superior to sella 1/5 CT angio head >> no aneurysm, 13 mm mass Rt sella/suprasellar region 1/6 EEG >> normal sleep EEG 1/8 Echo >> mild LVH, EF 55 to 60%, mod LA dilation  LINES / TUBES: 1/5 ETT>>> 1/7 Rt PICC>>>  CULTURES: 1/5 BC>>> 1/5 MRSA >> positive  ANTIBIOTICS: Acyclovir 1/5>>>1/8  SUBJECTIVE:  More alert on WUA today.  Did not tolerate SBT >> low Vt, high RR.  VITAL SIGNS: Temp:  [96.3 F (35.7 C)-99.4 F (37.4 C)] 99.4 F (37.4 C) (01/09 0800) Pulse Rate:  [57-81] 81 (01/09 0900) Resp:  [14-25] 19 (01/09 0900) BP: (119-185)/(67-106) 162/104 mmHg (01/09 0900) SpO2:  [96 %-100 %] 99 % (01/09 0900) FiO2 (%):  [30 %] 30 % (01/09 0831) Weight:  [412 lb 14.8 oz (187.3 kg)] 412 lb 14.8 oz (187.3 kg) (01/09 0341) VENTILATOR SETTINGS: Vent Mode:  [-] PRVC FiO2 (%):  [30 %] 30 % Set Rate:  [14 bmp] 14 bmp Vt Set:  [620 mL] 620 mL PEEP:  [5 cmH20] 5 cmH20 Plateau Pressure:  [22 cmH20-29 cmH20] 29 cmH20 INTAKE / OUTPUT: Intake/Output     01/08 0701 - 01/09 0700 01/09 0701 - 01/10 0700   P.O. 450    I.V. (mL/kg) 931 (5)    NG/GT 641.8    IV Piggyback 210    Total Intake(mL/kg) 2232.8 (11.9)    Urine (mL/kg/hr) 1690 (0.4) 200 (0.4)   Total Output 1690 200   Net +542.8 -200          PHYSICAL EXAMINATION: General: No distress Neuro: moves extremities with WUA, not following commands HEENT: ETT in place Cardiovascular: regular, no murmur Lungs:  decreased breath sounds, no wheeze, scattered rhonchi Abdomen: obese , soft, non tender Musculoskeletal: 1+ non pitting edema Skin: chronic venous stasis changes  LABS: CBC  Recent Labs Lab 06/22/13 0905 06/23/13 0240 06/24/13 0530  WBC 11.4* 13.4* 11.0*  HGB 12.4* 11.4* 12.1*  HCT 37.6* 35.7* 37.1*  PLT 323 312 324   Coag's  Recent Labs Lab 06/21/13 1750  INR 0.91   BMET  Recent Labs Lab 06/23/13 2150 06/24/13 0530 06/25/13 0430  NA 149* 148* 150*  K 3.4* 3.4* 3.1*  CL 111 110 112  CO2 27 27 27   BUN 37* 37* 43*  CREATININE 1.88* 1.88* 1.77*  GLUCOSE 159* 162* 166*   Electrolytes  Recent Labs Lab 06/22/13 0905  06/23/13 0240 06/23/13 2150 06/24/13 0530 06/25/13 0430  CALCIUM 8.2*  < > 8.2* 8.7 8.8 8.7  MG 1.5  --  1.6  --  1.8  --   PHOS 2.7  --  3.6  --   --   --   < > = values in this interval not displayed. Sepsis Markers  Recent Labs Lab 06/21/13 2224  LATICACIDVEN 1.39   ABG  Recent Labs Lab 06/21/13 1942 06/21/13 2215 06/22/13 0522  PHART 7.262* 7.362 7.419  PCO2ART 59.8* 48.1* 38.0  PO2ART 328.0* 85.6 62.7*  Liver Enzymes  Recent Labs Lab 06/21/13 1750 06/23/13 0240  AST 56* 12  ALT 42 19  ALKPHOS 161* 100  BILITOT <0.2* <0.2*  ALBUMIN 2.8* 1.9*   Cardiac Enzymes  Recent Labs Lab 06/21/13 1750 06/22/13 0232  TROPONINI <0.30 <0.30   Glucose  Recent Labs Lab 06/24/13 1205 06/24/13 1539 06/24/13 1941 06/24/13 2348 06/25/13 0333 06/25/13 0759  GLUCAP 159* 190* 136* 176* 162* 154*    Imaging Dg Chest Port 1 View  06/25/2013   CLINICAL DATA:  CHF.  Hypertension.  Diabetes.  Morbid obesity.  EXAM: PORTABLE CHEST - 1 VIEW  COMPARISON:  DG CHEST 1V PORT dated 06/24/2013; DG CHEST 1V PORT dated 06/23/2013  FINDINGS: Endotracheal tube 4.5 cm above carina. Nasogastric tube poorly visualized distally but likely extends beyond the inferior aspect of the film. Right-sided PICC line terminates at low SVC.  Cardiomegaly  accentuated by AP portable technique. Cannot exclude small left pleural effusion. No pneumothorax. Low lung volumes. Slight improvement in moderate interstitial edema. Persistent left base airspace disease.  IMPRESSION: Low lung volumes with improved moderate interstitial edema.  Probable left base atelectasis and adjacent small left pleural effusion.   Electronically Signed   By: Abigail Miyamoto M.D.   On: 06/25/2013 07:29   Dg Chest Port 1 View  06/24/2013   CLINICAL DATA:  Evaluate pulmonary infiltrates. Hypertension. Diabetes. Sleep apnea. Morbid obesity.  EXAM: PORTABLE CHEST - 1 VIEW  COMPARISON:  DG CHEST 1V PORT dated 06/23/2013; DG CHEST 1V PORT dated 06/21/2013  FINDINGS: Endotracheal tube terminates 4.4 cm above carina. Nasogastric tube poorly visualized distally but likely extends beyond the inferior aspect of the film.  Cardiomegaly accentuated by AP portable technique. No right and no definite left pleural effusion. Inferior left hemi thorax poorly evaluated. Low lung volumes. Similar concurrent mild to moderate interstitial edema. Patchy bibasilar airspace disease which is not significantly changed.  IMPRESSION: No significant change since the prior exam.  Interstitial edema, superimposed upon low lung volumes.  Bibasilar Airspace disease, likely atelectasis.   Electronically Signed   By: Abigail Miyamoto M.D.   On: 06/24/2013 07:49    ASSESSMENT / PLAN:  PULMONARY A: Acute respiratory failure in setting of encephalopathy and seizures. Hx of OSA >> followed by Dr. Gwenette Greet as outpt. P:   -pressure support wean as tolerated >> not ready for extubation yet; discussed possible need for trach with family -f/u CXR  CARDIOVASCULAR A:  Malignant HTN. P:  -goal SBP < 170 -continue amlodipine, carvedilol, clonidine -hold outpt enalapril, cozaar, aldactone, lasix for now in setting of renal insufficiency and hypernatremia  RENAL A:  Acute kidney injury >> improving. Stage 3 CKD.   Hypokalemia. Hypernatremia >> improving. P:   -f/u renal fx, urine outpt, electrolytes  GASTROINTESTINAL A:  Nutrition. Morbid obesity. ?aspiration event 1/6. P:   -NPO -protonix for SUP -resumed tube feeds 1/8  HEMATOLOGIC A:  Mild leukocytosis >> improving. P:  -SQ heparin for DVT prevention -f/u CBC intermittently  INFECTIOUS A:   ?HSV as cause of altered mental status >> seems less likely >> acyclovir d/c'ed 1/8. Chronic stasis ulcers of lower extremities c P:   -aquacel per wound care team  ENDOCRINE A:  DM type II with hyperglycemia. P:   -SSI with lantus -hold outpt glyburide, metformin for now  NEUROLOGIC A:   Acute encephalopathy most likely 2nd to malignant HTN complicated by seizures.  Less likely menigitis/HSV >> unable to do MRI due to pt body habitus. 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 P:   -keppra per neurology  Updated family at bedside.  CC time 35 minutes.  Chesley Mires, MD [facility] Pulmonary/Critical Care 06/25/2013, 9:30 AM Pager:  [phone] After 3pm call: [phone]

## 2013-06-26 ENCOUNTER — Inpatient Hospital Stay (HOSPITAL_COMMUNITY): Payer: BC Managed Care – PPO

## 2013-06-26 LAB — BASIC METABOLIC PANEL
BUN: 40 mg/dL — ABNORMAL HIGH (ref 6–23)
CALCIUM: 8.6 mg/dL (ref 8.4–10.5)
CHLORIDE: 114 meq/L — AB (ref 96–112)
CO2: 25 mEq/L (ref 19–32)
Creatinine, Ser: 1.65 mg/dL — ABNORMAL HIGH (ref 0.50–1.35)
GFR calc Af Amer: 50 mL/min — ABNORMAL LOW (ref >90)
GFR calc non Af Amer: 43 mL/min — ABNORMAL LOW (ref >90)
GLUCOSE: 164 mg/dL — AB (ref 70–99)
Potassium: 3.5 mEq/L — ABNORMAL LOW (ref 3.7–5.3)
SODIUM: 153 meq/L — AB (ref 137–147)

## 2013-06-26 LAB — GLUCOSE, CAPILLARY
GLUCOSE-CAPILLARY: 132 mg/dL — AB (ref 70–99)
GLUCOSE-CAPILLARY: 177 mg/dL — AB (ref 70–99)
Glucose-Capillary: 169 mg/dL — ABNORMAL HIGH (ref 70–99)
Glucose-Capillary: 154 mg/dL — ABNORMAL HIGH (ref 70–99)
Glucose-Capillary: 193 mg/dL — ABNORMAL HIGH (ref 70–99)

## 2013-06-26 LAB — CBC
HCT: 37.6 % — ABNORMAL LOW (ref 39.0–52.0)
Hemoglobin: 11.9 g/dL — ABNORMAL LOW (ref 13.0–17.0)
MCH: 28.7 pg (ref 26.0–34.0)
MCHC: 31.6 g/dL (ref 30.0–36.0)
MCV: 90.6 fL (ref 78.0–100.0)
PLATELETS: 334 10*3/uL (ref 150–400)
RBC: 4.15 MIL/uL — AB (ref 4.22–5.81)
RDW: 17.3 % — ABNORMAL HIGH (ref 11.5–15.5)
WBC: 10 10*3/uL (ref 4.0–10.5)

## 2013-06-26 LAB — TRIGLYCERIDES: Triglycerides: 197 mg/dL — ABNORMAL HIGH (ref <150)

## 2013-06-26 MED ORDER — POTASSIUM CHLORIDE 20 MEQ/15ML (10%) PO LIQD
40.0000 meq | Freq: Once | ORAL | Status: AC
  Administered 2013-06-26: 40 meq
  Filled 2013-06-26: qty 30

## 2013-06-26 NOTE — Progress Notes (Signed)
Name: Blake Bell MRN: [id number] DOB: 03/14/1951    ADMISSION DATE:  06/21/2013  REFERRING MD :  EDP  CHIEF COMPLAINT: Altered mental status  BRIEF PATIENT DESCRIPTION:  63 yo male with altered mental status and seizures.  Intubated for airway protection.  BP in ER 214/148.  PCCM asked to admit to ICU.  SIGNIFICANT EVENTS: 1/5 Admit, neurology consulted 1/6 Off insulin gtt 1/7 Possible aspiration of tube feeds  STUDIES:  1/5 CT head >> mild atrophy with patchy small vessel disease, ?aneurysm Rt superior to sella 1/5 CT angio head >> no aneurysm, 13 mm mass Rt sella/suprasellar region 1/6 EEG >> normal sleep EEG 1/8 Echo >> mild LVH, EF 55 to 60%, mod LA dilation  LINES / TUBES: 1/5 ETT>>> 1/7 Rt PICC>>>  CULTURES: 1/5 BC>>> 1/5 MRSA >> positive  ANTIBIOTICS: Acyclovir 1/5>>>1/8  SUBJECTIVE:  Tolerated pressure support better this AM.  VITAL SIGNS: Temp:  [97.8 F (36.6 C)-100.1 F (37.8 C)] 97.8 F (36.6 C) (01/10 0400) Pulse Rate:  [65-93] 65 (01/10 0600) Resp:  [14-27] 19 (01/10 0600) BP: (119-184)/(66-104) 157/73 mmHg (01/10 0600) SpO2:  [97 %-100 %] 100 % (01/10 0600) FiO2 (%):  [30 %] 30 % (01/10 0600) Weight:  [418 lb 3.4 oz (189.7 kg)] 418 lb 3.4 oz (189.7 kg) (01/10 0338) VENTILATOR SETTINGS: Vent Mode:  [-] PRVC FiO2 (%):  [30 %] 30 % Set Rate:  [14 bmp] 14 bmp Vt Set:  [620 mL] 620 mL PEEP:  [5 cmH20] 5 cmH20 Plateau Pressure:  [21 cmH20-29 cmH20] 21 cmH20 INTAKE / OUTPUT: Intake/Output     01/09 0701 - 01/10 0700   I.V. (mL/kg) 887.4 (4.7)   NG/GT 495   IV Piggyback 210   Total Intake(mL/kg) 1592.4 (8.4)   Urine (mL/kg/hr) 3075 (0.7)   Total Output 3075   Net -1482.6         PHYSICAL EXAMINATION: General: No distress Neuro: moves extremities with WUA, not following commands HEENT: ETT in place Cardiovascular: regular, no murmur Lungs: decreased breath sounds, no wheeze, scattered rhonchi Abdomen: obese , soft, non  tender Musculoskeletal: 1+ non pitting edema Skin: chronic venous stasis changes  LABS: CBC  Recent Labs Lab 06/23/13 0240 06/24/13 0530 06/26/13 0521  WBC 13.4* 11.0* 10.0  HGB 11.4* 12.1* 11.9*  HCT 35.7* 37.1* 37.6*  PLT 312 324 334   Coag's  Recent Labs Lab 06/21/13 1750  INR 0.91   BMET  Recent Labs Lab 06/23/13 2150 06/24/13 0530 06/25/13 0430  NA 149* 148* 150*  K 3.4* 3.4* 3.1*  CL 111 110 112  CO2 27 27 27   BUN 37* 37* 43*  CREATININE 1.88* 1.88* 1.77*  GLUCOSE 159* 162* 166*   Electrolytes  Recent Labs Lab 06/22/13 0905  06/23/13 0240 06/23/13 2150 06/24/13 0530 06/25/13 0430  CALCIUM 8.2*  < > 8.2* 8.7 8.8 8.7  MG 1.5  --  1.6  --  1.8  --   PHOS 2.7  --  3.6  --   --   --   < > = values in this interval not displayed. Sepsis Markers  Recent Labs Lab 06/21/13 2224  LATICACIDVEN 1.39   ABG  Recent Labs Lab 06/21/13 1942 06/21/13 2215 06/22/13 0522  PHART 7.262* 7.362 7.419  PCO2ART 59.8* 48.1* 38.0  PO2ART 328.0* 85.6 62.7*   Liver Enzymes  Recent Labs Lab 06/21/13 1750 06/23/13 0240  AST 56* 12  ALT 42 19  ALKPHOS 161* 100  BILITOT <0.2* <0.2*  ALBUMIN 2.8* 1.9*   Cardiac Enzymes  Recent Labs Lab 06/21/13 1750 06/22/13 0232  TROPONINI <0.30 <0.30   Glucose  Recent Labs Lab 06/25/13 0759 06/25/13 1212 06/25/13 1619 06/25/13 1931 06/25/13 2340 06/26/13 0334  GLUCAP 154* 150* 113* 177* 184* 132*    Imaging Dg Chest Port 1 View  06/25/2013   CLINICAL DATA:  CHF.  Hypertension.  Diabetes.  Morbid obesity.  EXAM: PORTABLE CHEST - 1 VIEW  COMPARISON:  DG CHEST 1V PORT dated 06/24/2013; DG CHEST 1V PORT dated 06/23/2013  FINDINGS: Endotracheal tube 4.5 cm above carina. Nasogastric tube poorly visualized distally but likely extends beyond the inferior aspect of the film. Right-sided PICC line terminates at low SVC.  Cardiomegaly accentuated by AP portable technique. Cannot exclude small left pleural effusion. No  pneumothorax. Low lung volumes. Slight improvement in moderate interstitial edema. Persistent left base airspace disease.  IMPRESSION: Low lung volumes with improved moderate interstitial edema.  Probable left base atelectasis and adjacent small left pleural effusion.   Electronically Signed   By: Abigail Miyamoto M.D.   On: 06/25/2013 07:29    ASSESSMENT / PLAN:  PULMONARY A: Acute respiratory failure in setting of encephalopathy and seizures. Hx of OSA >> followed by Dr. Gwenette Greet as outpt. P:   -pressure support wean as tolerated >> making some improvement, but still concerned he will need trach -f/u CXR  CARDIOVASCULAR A:  Malignant HTN. P:  -goal SBP < 170 -continue amlodipine, carvedilol, clonidine -hold outpt enalapril, cozaar, aldactone, lasix for now in setting of renal insufficiency and hypernatremia  RENAL A:  Acute kidney injury >> improving. Stage 3 CKD.  Hypokalemia. Hypernatremia >> improving. P:   -f/u renal fx, urine outpt, electrolytes  GASTROINTESTINAL A:  Nutrition. Morbid obesity. ?aspiration event 1/6. P:   -NPO -protonix for SUP -resumed tube feeds 1/8  HEMATOLOGIC A:  Mild leukocytosis >> improving. P:  -SQ heparin for DVT prevention -f/u CBC intermittently  INFECTIOUS A:   ?HSV as cause of altered mental status >> seems less likely >> acyclovir d/c'ed 1/8. Chronic stasis ulcers of lower extremities c P:   -aquacel per wound care team  ENDOCRINE A:  DM type II with hyperglycemia. P:   -SSI with lantus -hold outpt glyburide, metformin for now  NEUROLOGIC A:   Acute encephalopathy most likely 2nd to malignant HTN complicated by seizures.  Less likely menigitis/HSV >> unable to do MRI due to pt body habitus. 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 P:   -keppra per neurology   CC time 35 minutes.  Chesley Mires, MD [facility] Pulmonary/Critical Care 06/26/2013, 7:00 AM Pager:  [phone] After 3pm call: [phone]

## 2013-06-27 ENCOUNTER — Inpatient Hospital Stay (HOSPITAL_COMMUNITY): Payer: BC Managed Care – PPO

## 2013-06-27 LAB — BASIC METABOLIC PANEL
BUN: 44 mg/dL — AB (ref 6–23)
CO2: 29 mEq/L (ref 19–32)
CREATININE: 1.55 mg/dL — AB (ref 0.50–1.35)
Calcium: 8.8 mg/dL (ref 8.4–10.5)
Chloride: 118 mEq/L — ABNORMAL HIGH (ref 96–112)
GFR calc Af Amer: 54 mL/min — ABNORMAL LOW (ref >90)
GFR, EST NON AFRICAN AMERICAN: 46 mL/min — AB (ref >90)
GLUCOSE: 160 mg/dL — AB (ref 70–99)
POTASSIUM: 3.5 meq/L — AB (ref 3.7–5.3)
Sodium: 156 mEq/L — ABNORMAL HIGH (ref 137–147)

## 2013-06-27 LAB — CBC
HEMATOCRIT: 38.1 % — AB (ref 39.0–52.0)
Hemoglobin: 12.3 g/dL — ABNORMAL LOW (ref 13.0–17.0)
MCH: 29.9 pg (ref 26.0–34.0)
MCHC: 32.3 g/dL (ref 30.0–36.0)
MCV: 92.5 fL (ref 78.0–100.0)
Platelets: 320 10*3/uL (ref 150–400)
RBC: 4.12 MIL/uL — ABNORMAL LOW (ref 4.22–5.81)
RDW: 17.8 % — AB (ref 11.5–15.5)
WBC: 10.6 10*3/uL — ABNORMAL HIGH (ref 4.0–10.5)

## 2013-06-27 LAB — GLUCOSE, CAPILLARY
GLUCOSE-CAPILLARY: 149 mg/dL — AB (ref 70–99)
GLUCOSE-CAPILLARY: 159 mg/dL — AB (ref 70–99)
Glucose-Capillary: 137 mg/dL — ABNORMAL HIGH (ref 70–99)
Glucose-Capillary: 173 mg/dL — ABNORMAL HIGH (ref 70–99)
Glucose-Capillary: 200 mg/dL — ABNORMAL HIGH (ref 70–99)

## 2013-06-27 MED ORDER — LEVETIRACETAM 500 MG/5ML IV SOLN
500.0000 mg | Freq: Two times a day (BID) | INTRAVENOUS | Status: DC
  Administered 2013-06-27 – 2013-06-29 (×5): 500 mg via INTRAVENOUS
  Filled 2013-06-27 (×6): qty 5

## 2013-06-27 MED ORDER — LABETALOL HCL 5 MG/ML IV SOLN
20.0000 mg | INTRAVENOUS | Status: DC | PRN
  Administered 2013-06-27 – 2013-07-04 (×4): 20 mg via INTRAVENOUS
  Filled 2013-06-27 (×5): qty 4

## 2013-06-27 MED ORDER — LEVETIRACETAM 100 MG/ML PO SOLN
500.0000 mg | Freq: Two times a day (BID) | ORAL | Status: DC
  Filled 2013-06-27 (×2): qty 5

## 2013-06-27 MED ORDER — PANTOPRAZOLE SODIUM 40 MG IV SOLR
40.0000 mg | INTRAVENOUS | Status: DC
  Administered 2013-06-27 – 2013-07-02 (×6): 40 mg via INTRAVENOUS
  Filled 2013-06-27 (×8): qty 40

## 2013-06-27 MED ORDER — INSULIN ASPART 100 UNIT/ML ~~LOC~~ SOLN
0.0000 [IU] | SUBCUTANEOUS | Status: DC
  Administered 2013-06-27 (×3): 4 [IU] via SUBCUTANEOUS
  Administered 2013-06-28: 3 [IU] via SUBCUTANEOUS
  Administered 2013-06-28 – 2013-06-29 (×6): 4 [IU] via SUBCUTANEOUS
  Administered 2013-06-29: 3 [IU] via SUBCUTANEOUS
  Administered 2013-06-29 (×2): 4 [IU] via SUBCUTANEOUS
  Administered 2013-06-29 – 2013-06-30 (×3): 3 [IU] via SUBCUTANEOUS
  Administered 2013-06-30 – 2013-07-01 (×4): 4 [IU] via SUBCUTANEOUS
  Administered 2013-07-01: 3 [IU] via SUBCUTANEOUS
  Administered 2013-07-01: 7 [IU] via SUBCUTANEOUS
  Administered 2013-07-01 – 2013-07-02 (×4): 3 [IU] via SUBCUTANEOUS
  Administered 2013-07-02 – 2013-07-03 (×6): 4 [IU] via SUBCUTANEOUS
  Administered 2013-07-03: 7 [IU] via SUBCUTANEOUS
  Administered 2013-07-03: 4 [IU] via SUBCUTANEOUS

## 2013-06-27 MED ORDER — POTASSIUM CHLORIDE 20 MEQ/15ML (10%) PO LIQD
20.0000 meq | ORAL | Status: DC
  Administered 2013-06-27: 20 meq
  Filled 2013-06-27: qty 15

## 2013-06-27 MED ORDER — NICARDIPINE HCL IN NACL 20-0.86 MG/200ML-% IV SOLN
5.0000 mg/h | INTRAVENOUS | Status: DC
  Administered 2013-06-27 (×2): 5 mg/h via INTRAVENOUS
  Administered 2013-06-28: 8 mg/h via INTRAVENOUS
  Administered 2013-06-28 (×2): 5 mg/h via INTRAVENOUS
  Administered 2013-06-28: 5.5 mg/h via INTRAVENOUS
  Administered 2013-06-28 (×2): 5 mg/h via INTRAVENOUS
  Administered 2013-06-29: 6 mg/h via INTRAVENOUS
  Administered 2013-06-29: 4 mg/h via INTRAVENOUS
  Administered 2013-06-29: 7 mg/h via INTRAVENOUS
  Administered 2013-06-29: 5 mg/h via INTRAVENOUS
  Administered 2013-06-29: 7 mg/h via INTRAVENOUS
  Administered 2013-06-29: 6 mg/h via INTRAVENOUS
  Administered 2013-06-30: 4 mg/h via INTRAVENOUS
  Filled 2013-06-27 (×18): qty 200

## 2013-06-27 MED ORDER — FREE WATER: 250.0000 mL | Freq: Four times a day (QID) | Status: DC

## 2013-06-27 MED ORDER — LABETALOL HCL 5 MG/ML IV SOLN
10.0000 mg | INTRAVENOUS | Status: DC | PRN
  Administered 2013-06-27: 10 mg via INTRAVENOUS
  Filled 2013-06-27 (×2): qty 4

## 2013-06-27 MED ORDER — LABETALOL HCL 5 MG/ML IV SOLN
20.0000 mg | Freq: Once | INTRAVENOUS | Status: AC
  Administered 2013-06-27: 20 mg via INTRAVENOUS

## 2013-06-27 MED ORDER — DEXTROSE 5 % IV SOLN
INTRAVENOUS | Status: DC
  Administered 2013-06-27: 50 mL via INTRAVENOUS
  Administered 2013-06-28: 01:00:00 via INTRAVENOUS

## 2013-06-27 NOTE — Progress Notes (Signed)
Name: Blake Bell MRN: [id number] DOB: 02/03/1951    ADMISSION DATE:  06/21/2013  REFERRING MD :  EDP  CHIEF COMPLAINT: Altered mental status  BRIEF PATIENT DESCRIPTION:  63 yo male with altered mental status and seizures.  Intubated for airway protection.  BP in ER 214/148.  PCCM asked to admit to ICU.  SIGNIFICANT EVENTS: 1/5 Admit, neurology consulted 1/6 Off insulin gtt 1/7 Possible aspiration of tube feeds  STUDIES:  1/5 CT head >> mild atrophy with patchy small vessel disease, ?aneurysm Rt superior to sella 1/5 CT angio head >> no aneurysm, 13 mm mass Rt sella/suprasellar region 1/6 EEG >> normal sleep EEG 1/8 Echo >> mild LVH, EF 55 to 60%, mod LA dilation  LINES / TUBES: 1/5 ETT>>> 1/7 Rt PICC>>>  CULTURES: 1/5 BC>>> 1/5 MRSA >> positive  ANTIBIOTICS: Acyclovir 1/5>>>1/8  SUBJECTIVE:  Tolerated pressure support better this AM.  VITAL SIGNS: Temp:  [97.7 F (36.5 C)-98.8 F (37.1 C)] 98.3 F (36.8 C) (01/11 0400) Pulse Rate:  [65-89] 71 (01/11 0645) Resp:  [14-24] 16 (01/11 0645) BP: (136-192)/(70-121) 147/75 mmHg (01/11 0645) SpO2:  [95 %-100 %] 99 % (01/11 0645) FiO2 (%):  [30 %] 30 % (01/11 0645) Weight:  [391 lb 15.7 oz (177.8 kg)] 391 lb 15.7 oz (177.8 kg) (01/11 0433) VENTILATOR SETTINGS: Vent Mode:  [-] PRVC FiO2 (%):  [30 %] 30 % Set Rate:  [14 bmp] 14 bmp Vt Set:  [620 mL] 620 mL PEEP:  [5 cmH20] 5 cmH20 Pressure Support:  [5 cmH20] 5 cmH20 Plateau Pressure:  [20 cmH20-26 cmH20] 21 cmH20 INTAKE / OUTPUT: Intake/Output     01/10 0701 - 01/11 0700 01/11 0701 - 01/12 0700   I.V. (mL/kg) 1020.4 (5.7)    NG/GT 570    IV Piggyback 205    Total Intake(mL/kg) 1795.4 (10.1)    Urine (mL/kg/hr) 2315 (0.5)    Total Output 2315     Net -519.6            PHYSICAL EXAMINATION: General: No distress Neuro: moves extremities with WUA, not following commands HEENT: ETT in place Cardiovascular: regular, no murmur Lungs: decreased breath  sounds, no wheeze, scattered rhonchi Abdomen: obese , soft, non tender Musculoskeletal: 1+ non pitting edema Skin: chronic venous stasis changes  LABS: CBC  Recent Labs Lab 06/24/13 0530 06/26/13 0521 06/27/13 0417  WBC 11.0* 10.0 10.6*  HGB 12.1* 11.9* 12.3*  HCT 37.1* 37.6* 38.1*  PLT 324 334 320   Coag's  Recent Labs Lab 06/21/13 1750  INR 0.91   BMET  Recent Labs Lab 06/25/13 0430 06/26/13 0521 06/27/13 0417  NA 150* 153* 156*  K 3.1* 3.5* 3.5*  CL 112 114* 118*  CO2 27 25 29   BUN 43* 40* 44*  CREATININE 1.77* 1.65* 1.55*  GLUCOSE 166* 164* 160*   Electrolytes  Recent Labs Lab 06/22/13 0905  06/23/13 0240  06/24/13 0530 06/25/13 0430 06/26/13 0521 06/27/13 0417  CALCIUM 8.2*  < > 8.2*  < > 8.8 8.7 8.6 8.8  MG 1.5  --  1.6  --  1.8  --   --   --   PHOS 2.7  --  3.6  --   --   --   --   --   < > = values in this interval not displayed. Sepsis Markers  Recent Labs Lab 06/21/13 2224  LATICACIDVEN 1.39   ABG  Recent Labs Lab 06/21/13 1942 06/21/13 2215 06/22/13 0522  PHART  7.262* 7.362 7.419  PCO2ART 59.8* 48.1* 38.0  PO2ART 328.0* 85.6 62.7*   Liver Enzymes  Recent Labs Lab 06/21/13 1750 06/23/13 0240  AST 56* 12  ALT 42 19  ALKPHOS 161* 100  BILITOT <0.2* <0.2*  ALBUMIN 2.8* 1.9*   Cardiac Enzymes  Recent Labs Lab 06/21/13 1750 06/22/13 0232  TROPONINI <0.30 <0.30   Glucose  Recent Labs Lab 06/26/13 0334 06/26/13 0726 06/26/13 1159 06/26/13 1532 06/26/13 1942 06/26/13 2353  GLUCAP 132* 177* 169* 154* 193* 159*    Imaging Dg Chest Port 1 View  06/26/2013   CLINICAL DATA:  Evaluate endotracheal placement.  EXAM: PORTABLE CHEST - 1 VIEW  COMPARISON:  06/25/2013  FINDINGS: The endotracheal tube is 7 cm above the carina and right at the thoracic inlet. The NG tube is coursing down the esophagus and into the stomach. The heart is enlarged but stable. There is persistent vascular congestion and interstitial edema  without obvious pleural effusion or pneumothorax.  IMPRESSION: The endotracheal tube is 7 cm above the carina. The NG tube and right PICC line are stable.  Persistent discussed that stable cardiac enlargement and pulmonary edema and areas of atelectasis.   Electronically Signed   By: Kalman Jewels M.D.   On: 06/26/2013 08:28    ASSESSMENT / PLAN:  PULMONARY A: Acute respiratory failure in setting of encephalopathy and seizures. Hx of OSA >> followed by Dr. Gwenette Greet as outpt. P:   -pressure support wean as tolerated >> making some improvement, but still concerned he will need trach -f/u CXR  CARDIOVASCULAR A:  Malignant HTN. P:  -goal SBP < 170 -continue amlodipine, carvedilol, clonidine -hold outpt enalapril, cozaar, aldactone, lasix for now in setting of renal insufficiency and hypernatremia  RENAL A:  Acute kidney injury >> improving. Stage 3 CKD.  Hypokalemia. Hypernatremia. P:   -f/u renal fx, urine outpt, electrolytes -increase free water  GASTROINTESTINAL A:  Nutrition. Morbid obesity. ?aspiration event 1/6. P:   -NPO -protonix for SUP -resumed tube feeds 1/8  HEMATOLOGIC A:  Mild leukocytosis >> improving. P:  -SQ heparin for DVT prevention -f/u CBC intermittently  INFECTIOUS A:   ?HSV as cause of altered mental status >> seems less likely >> acyclovir d/c'ed 1/8. Chronic stasis ulcers of lower extremities c P:   -aquacel per wound care team  ENDOCRINE A:  DM type II with hyperglycemia. P:   -SSI with lantus -hold outpt glyburide, metformin for now  NEUROLOGIC A:   Acute encephalopathy most likely 2nd to malignant HTN complicated by seizures.  Less likely menigitis/HSV >> unable to do MRI due to pt body habitus. 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 P:   -keppra per neurology   CC time 35 minutes.  Chesley Mires, MD [facility] Pulmonary/Critical Care 06/27/2013, 7:11 AM Pager:  [phone] After 3pm call: [phone]

## 2013-06-27 NOTE — Progress Notes (Signed)
Restraint use reviewed. Bilateral wrist restraints removed, as pt. No longer attempting to remove equipment/ medical devices. Family members at bedside. Will continue to monitor.

## 2013-06-27 NOTE — Progress Notes (Signed)
ABG attempt unsuccessful.

## 2013-06-27 NOTE — Progress Notes (Signed)
Pt self-extubated, pt placed on BiPAP IPAP 14 EPAP 6 FIO2 40% per MD.

## 2013-06-27 NOTE — Progress Notes (Signed)
ETT advanced 2 cm per MD order. ETT now 24 at the lip.

## 2013-06-27 NOTE — Significant Event (Signed)
Pt self extubated.  Hemodynamics stable.  Mild abdominal breathing pattern, no stridor.  Will try on BiPAP, and f/u ABG.  Hold coreg, catapres, norvasc for now.  Will continue prn hydralazine, and add prn labetalol.  Chesley Mires, MD [facility] Pulmonary/Critical Care 06/27/2013, 7:39 AM Pager:  [phone] After 3pm call: [phone]

## 2013-06-27 NOTE — Progress Notes (Signed)
Subjective: Patient has been extubated successfully. He is awake and able to follow commands. No overnight adverse events reported  Objective: Current vital signs: BP 166/83  Pulse 83  Temp(Src) 98.3 F (36.8 C) (Oral)  Resp 23  Ht 6' (1.829 m)  Wt 177.8 kg (391 lb 15.7 oz)  BMI 53.15 kg/m2  SpO2 100%  Neurologic Exam: Alert and somewhat short of breath but otherwise in no distress. Patient was able to follow commands well. Pupils were equal and reacted normally to light. Extraocular movements were full and conjugate. No facial weakness noted. Patient was able to move extremities equally with no signs of focal weakness. Muscle tone was flaccid throughout.  Medications: I have reviewed the patient's current medications.  Assessment/Plan: Encephalopathic state most likely secondary to multiple factors, including hypertensive emergency and DKA. Patient has not had recurrence of seizures and is tolerating Keppra well. Encephalopathic state is markedly improved.  Recommend no changes in current management. We will continue to follow this patient with you.  C.R. Nicole Kindred, MD [facility] [phone]  06/27/2013  8:56 AM

## 2013-06-27 NOTE — Progress Notes (Signed)
[facility] ADULT ICU REPLACEMENT PROTOCOL FOR AM LAB REPLACEMENT ONLY  The patient does apply for the [facility] Adult ICU Electrolyte Replacment Protocol based on the criteria listed below:   1. Is GFR >/= 40 ml/min? yes  Patient's GFR today is 54 2. Is urine output >/= 0.5 ml/kg/hr for the last 6 hours? yes Patient's UOP is 0.61 ml/kg/hr 3. Is BUN < 60 mg/dL? yes  Patient's BUN today is 44 4. Abnormal electrolyte(s):K+ 3.5 5. Ordered repletion with: see order 6. If a panic level lab has been reported, has the CCM MD in charge been notified? yes.   Physician:  Dr. Jodi Geralds, Siddharth Babington A 06/27/2013 5:43 AM

## 2013-06-28 ENCOUNTER — Inpatient Hospital Stay (HOSPITAL_COMMUNITY): Payer: BC Managed Care – PPO

## 2013-06-28 LAB — BLOOD GAS, ARTERIAL
ACID-BASE EXCESS: 1.5 mmol/L (ref 0.0–2.0)
Bicarbonate: 26.4 mEq/L — ABNORMAL HIGH (ref 20.0–24.0)
DELIVERY SYSTEMS: POSITIVE
DRAWN BY: 36274
EXPIRATORY PAP: 6
FIO2: 60 %
INSPIRATORY PAP: 14
MODE: POSITIVE
O2 SAT: 99.2 %
PATIENT TEMPERATURE: 98.6
PO2 ART: 192 mmHg — AB (ref 80.0–100.0)
TCO2: 27.9 mmol/L (ref 0–100)
pCO2 arterial: 48.9 mmHg — ABNORMAL HIGH (ref 35.0–45.0)
pH, Arterial: 7.352 (ref 7.350–7.450)

## 2013-06-28 LAB — BASIC METABOLIC PANEL
BUN: 37 mg/dL — AB (ref 6–23)
CALCIUM: 8.8 mg/dL (ref 8.4–10.5)
CHLORIDE: 118 meq/L — AB (ref 96–112)
CO2: 27 meq/L (ref 19–32)
Creatinine, Ser: 1.48 mg/dL — ABNORMAL HIGH (ref 0.50–1.35)
GFR calc Af Amer: 57 mL/min — ABNORMAL LOW (ref >90)
GFR calc non Af Amer: 49 mL/min — ABNORMAL LOW (ref >90)
Glucose, Bld: 183 mg/dL — ABNORMAL HIGH (ref 70–99)
Potassium: 3.4 mEq/L — ABNORMAL LOW (ref 3.7–5.3)
Sodium: 156 mEq/L — ABNORMAL HIGH (ref 137–147)

## 2013-06-28 LAB — CULTURE, BLOOD (ROUTINE X 2): CULTURE: NO GROWTH

## 2013-06-28 LAB — GLUCOSE, CAPILLARY
GLUCOSE-CAPILLARY: 172 mg/dL — AB (ref 70–99)
GLUCOSE-CAPILLARY: 181 mg/dL — AB (ref 70–99)
GLUCOSE-CAPILLARY: 172 mg/dL — AB (ref 70–99)
Glucose-Capillary: 183 mg/dL — ABNORMAL HIGH (ref 70–99)
Glucose-Capillary: 196 mg/dL — ABNORMAL HIGH (ref 70–99)
Glucose-Capillary: 160 mg/dL — ABNORMAL HIGH (ref 70–99)
Glucose-Capillary: 138 mg/dL — ABNORMAL HIGH (ref 70–99)

## 2013-06-28 MED ORDER — FUROSEMIDE 10 MG/ML IJ SOLN
80.0000 mg | Freq: Four times a day (QID) | INTRAMUSCULAR | Status: DC
  Administered 2013-06-28 – 2013-06-29 (×3): 80 mg via INTRAVENOUS
  Filled 2013-06-28 (×4): qty 8

## 2013-06-28 MED ORDER — HALOPERIDOL LACTATE 5 MG/ML IJ SOLN
5.0000 mg | Freq: Once | INTRAMUSCULAR | Status: AC
  Administered 2013-06-28: 5 mg via INTRAVENOUS

## 2013-06-28 MED ORDER — POTASSIUM CHLORIDE 10 MEQ/50ML IV SOLN
10.0000 meq | INTRAVENOUS | Status: AC
  Administered 2013-06-28 – 2013-06-29 (×4): 10 meq via INTRAVENOUS
  Filled 2013-06-28 (×5): qty 50

## 2013-06-28 MED ORDER — DEXTROSE 5 % IV SOLN
INTRAVENOUS | Status: DC
  Administered 2013-06-28 – 2013-07-01 (×4): via INTRAVENOUS

## 2013-06-28 MED ORDER — HALOPERIDOL LACTATE 5 MG/ML IJ SOLN
INTRAMUSCULAR | Status: AC
  Filled 2013-06-28: qty 1

## 2013-06-28 MED ORDER — CLONIDINE HCL 0.2 MG/24HR TD PTWK
0.2000 mg | MEDICATED_PATCH | TRANSDERMAL | Status: DC
  Administered 2013-06-28: 0.2 mg via TRANSDERMAL
  Filled 2013-06-28: qty 1

## 2013-06-28 NOTE — Progress Notes (Signed)
UR completed.  Dustine Stickler, RN BSN MHA CCM Trauma/Neuro ICU Case Manager [phone]  

## 2013-06-28 NOTE — Progress Notes (Signed)
RT placed patient on his home cpap machine via nasal mask with 6lpm 02 bleed in. Patient is tolerating cpap well at this time.

## 2013-06-28 NOTE — Progress Notes (Signed)
eLink Physician-Brief Progress Note Patient Name: Marck Mcclenny DOB: Jul 28, 1950 MRN: [id number]  Date of Service  06/28/2013   HPI/Events of Note   Agitated delirium, attempting to get out of bed, wearing restraint mittens. No evidence pain as a precipitant. He has been incontinent on lasix, ? A contributor.   eICU Interventions  - haldol x 1 and follow MS - replace foley, goal only short term   Intervention Category Minor Interventions: Agitation / anxiety - evaluation and management  BYRUM,ROBERT S. 06/28/2013, 11:39 PM

## 2013-06-28 NOTE — Progress Notes (Signed)
Patient started to desat after being turned in the bed. RT found patient not moving a lot of air in his lungs. RT convinced patient to put bipap on. RT placed patient on bipap 14/6, 60%. Patient's sat came up to 97% after placement of bipap. Patient is tolerating bipap well at this time.

## 2013-06-28 NOTE — Progress Notes (Signed)
Subjective: Patient remains extubated and has been using CPAP. He had no new complaints.  Objective: Current vital signs: BP 171/86  Pulse 95  Temp(Src) 98.1 F (36.7 C) (Axillary)  Resp 29  Ht 6' (1.829 m)  Wt 180.8 kg (398 lb 9.5 oz)  BMI 54.05 kg/m2  SpO2 98%  Neurologic Exam: Alert and in no acute distress. Patient is oriented to correct age but disoriented to the correct month. He followed commands well. He was able to communicate verbally although he was very hoarse. Extraocular movements were full and conjugate. No facial weakness noted. Patient was diffusely weak with slightly greater weakness involving right upper extremity compared to left upper extremity. He had moderately severe weakness of both lower extremities which was symmetrical. Muscle tone was flaccid throughout.  Medications: I have reviewed the patient's current medications.  Assessment/Plan: Encephalopathic state most likely secondary to hypertensive encephalopathy as well as DKA. Patient is markedly improved with regaining consciousness and remaining awake as well as extubated. He follows commands well. He appears to have diffuse weakness which seems to be slightly more pronounced on the right upper extremity than elsewhere. Clinically he is not showing any signs of acute stroke, however.  Recommend no changes in current management. We will continue to follow this patient with you.  C.R. Nicole Kindred, MD [facility] [phone]  06/28/2013  10:53 AM

## 2013-06-28 NOTE — Progress Notes (Signed)
Name: Blake Bell MRN: [id number] DOB: [birth year]    ADMISSION DATE:  06/21/2013  REFERRING MD : EDP   CHIEF COMPLAINT: Altered mental status   BRIEF PATIENT DESCRIPTION:  63 yo male with altered mental status and seizures. Intubated for airway protection. BP in ER 214/148. PCCM asked to admit to ICU.   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 Oriented to person and month. Able to nod head yes/no appropriately. Denies pain, fevers, or chills.   VITAL SIGNS: Temp:  [96.8 F (36 C)-98.1 F (36.7 C)] 98.1 F (36.7 C) (01/12 0700) Pulse Rate:  [56-99] 95 (01/12 1015) Resp:  [15-32] 29 (01/12 1015) BP: (129-198)/(60-115) 171/86 mmHg (01/12 1015) SpO2:  [88 %-100 %] 98 % (01/12 1015) FiO2 (%):  [40 %] 40 % (01/11 1300) Weight:  [180.8 kg (398 lb 9.5 oz)] 180.8 kg (398 lb 9.5 oz) (01/12 0330) HEMODYNAMICS:   VENTILATOR SETTINGS: Vent Mode:  [-] BIPAP FiO2 (%):  [40 %] 40 % Set Rate:  [16 bmp] 16 bmp INTAKE / OUTPUT: Intake/Output     01/11 0701 - 01/12 0700 01/12 0701 - 01/13 0700   I.V. (mL/kg) 2296 (12.7) 303.6 (1.7)   NG/GT     IV Piggyback 205 104   Total Intake(mL/kg) 2501 (13.8) 407.6 (2.3)   Urine (mL/kg/hr) 1830 (0.4)    Total Output 1830     Net +671 +407.6        Urine Occurrence 1 x      PHYSICAL EXAMINATION: General: Awake, alert,  lethargic Neuro: moves extremities, follows commands  HEENT: PERRL, EOMI, congested upper airway sounds Cardiovascular: distant heart sounds due to body habitus, RRR, S1, S2 present, No murmur, rubs, gallops  Lungs: coarse and distant breath sounds, no wheezes Abdomen: obese, soft, non tender, non distended  Musculoskeletal: 1+ non pitting edema B/L LE Skin: chronic venous stasis changes  LABS:  CBC  Recent Labs Lab 06/24/13 0530 06/26/13 0521 06/27/13 0417  WBC 11.0* 10.0 10.6*  HGB 12.1* 11.9* 12.3*  HCT 37.1* 37.6* 38.1*  PLT 324 334 320   Coag's  Recent Labs Lab 06/21/13 1750  INR 0.91   BMET  Recent Labs Lab 06/26/13 0521 06/27/13 0417 06/28/13 0356  NA 153* 156* 156*  K 3.5* 3.5* 3.4*  CL 114* 118* 118*  CO2 _0 BUN 40* 44* 37*  CREATININE 1.65* 1.55* 1.48*  GLUCOSE 164* 160* 183*   Electrolytes  Recent Labs Lab 06/22/13 0905  06/23/13 0240  06/24/13 0530  06/26/13 0521 06/27/13 0417 06/28/13 0356  CALCIUM 8.2*  < > 8.2*  < > 8.8  < > 8.6 8.8 8.8  MG 1.5  --  1.6  --  1.8  --   --   --   --   PHOS 2.7  --  3.6  --   --   --   --   --   --   < > = values in this interval not displayed. Sepsis Markers  Recent Labs Lab 06/21/13 2224  LATICACIDVEN 1.39   ABG  Recent Labs Lab 06/21/13 1942 06/21/13 2215 06/22/13 0522  PHART 7.262* 7.362 7.419  PCO2ART 59.8* 48.1* 38.0  PO2ART 328.0* 85.6 62.7*   Liver Enzymes  Recent Labs Lab 06/21/13 1750 06/23/13 0240  AST 56* 12  ALT 42 19  ALKPHOS 161* 100  BILITOT <0.2* <0.2*  ALBUMIN 2.8* 1.9*   Cardiac Enzymes  Recent Labs Lab 06/21/13 1750 06/22/13 0232  TROPONINI <0.30 <0.30   Glucose  Recent Labs Lab 06/27/13 1128 06/27/13 1647 06/27/13 1953 06/27/13 2325 06/28/13 0323 06/28/13 0748  GLUCAP 149* 183* 200* 172* 181* 172*    Imaging Dg Chest Port 1 View  06/28/2013   CLINICAL DATA:  Pneumonia  EXAM: PORTABLE CHEST - 1 VIEW  COMPARISON:  June 27, 2013   FINDINGS: The lungs are borderline hypoinflated. The interstitial markings are diffusely increased but not significantly changed since yesterday's study. The cardiopericardial silhouette remains enlarged. The pulmonary vascularity remains prominent centrally and in the upper lobes. A right-sided subclavian venous catheter has its tip in the region of the distal portion of the SVC. The esophagogastric tube and endotracheal tube have been withdrawn.  IMPRESSION: The lungs are slightly better inflated today. The interstitial markings remain increased bilaterally consistent with pneumonia or other interstitial process. Confluent lung markings in the right infrahilar region may reflect infiltrate or subsegmental atelectasis.   Electronically Signed   By: David  Martinique   On: 06/28/2013 07:39   Dg Chest Port 1 View  06/27/2013   CLINICAL DATA:  CHF  EXAM: PORTABLE CHEST - 1 VIEW  COMPARISON:  06/26/2013  FINDINGS: Endotracheal tube is at the thoracic inlet 9.6 cm above the carina. Right upper extremity PICC line tip at the proximal right atrial level. NG tube is at the GE junction and could be advanced 5 cm into the stomach.  Heart remains enlarged with low lung volumes and mild diffuse interstitial edema. Scattered perihilar and bibasilar atelectasis. No enlarging effusion or pneumothorax. Slight interval improvement in aeration.  IMPRESSION: Minimal improvement in mild edema pattern/CHF  Low lung volumes persist with atelectasis   Electronically Signed   By: Daryll Brod M.D.   On: 06/27/2013 07:19   CXR: 1/12 - increased edema  ASSESSMENT / PLAN:  PULMONARY  A:  Acute respiratory failure in setting of encephalopathy and seizures.  Hx of OSA, uses CPAP at home >> followed by Dr. Gwenette Greet as outpt.  Self extubated 1/11 Pulled CPAP off last night, tolerated BiPAP well, then changed to CPAP again this am and now on 6LNC P:  Continue [place] @ 6L, goal O2>92%  CPAP at night, mandatory Need neg  balance abg  CARDIOVASCULAR  A:  Malignant HTN, 1/11 pos balance last 24 hrs P:  Goal SBP < 170 , goals met thus far, also consider more aggressive bP reduction this far into admission for hTN  lasix increase Continue to hold coreg, norvasc until passes swallow eval Ensure clonidine patch Continue prn hydralazine, labetalol Hold outpt enalapril, losartan, aldactone for now in setting of renal insufficiency and hypernatremia (unclear why pt on ARB AND ACEi) May ned NGt and feeds and meds through that  RENAL  A:  Acute kidney injury >> improving.  Stage 3 CKD.  Hypokalemia.  Hypernatremia.  Volume overload P:  Trend BMET Replete K x 4 runs Resume lasix, increase Continue form of free water with lasix  GASTROINTESTINAL  A:  Nutrition.  Morbid obesity.  ?aspiration event 1/6.  P:  NPO SLP to perform swallow eval Protonix IV May need NGT and feeds to start  HEMATOLOGIC  A:  Mild leukocytosis >> resolved P:  SQ heparin for DVT prevention  f/u CBC am  INFECTIOUS  A:   Chronic stasis ulcers of lower extremities  P:  aquacel per wound care team   ENDOCRINE  A:  DM type II with hyperglycemia.  P:  SSI - resistant Will resume lantus with diet Continue to hold outpt glyburide, metformin  NEUROLOGIC  A:  Acute encephalopathy most likely 2nd to malignant HTN complicated by seizures. Less likely menigitis/HSV >> unable to do MRI due to pt body habitus.  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  P:  Continue keppra per neurology Control BP more aggressive Neuro to CT angio Consider TSH, ACTH, GH, LH, FSH  TODAY'S SUMMARY: Will obtain SLP swallow eval and continue ventilation support via [place] and attempt to wean as tolerated. Add clon patch, for CT  Dwyane Dee, Scherrie Bateman, Medical Student 06/28/2013 12:25 PM   I have personally obtained a history, examined the patient, evaluated laboratory and imaging results, formulated the assessment and plan and placed  orders. CRITICAL CARE: The patient is critically ill with multiple organ systems failure and requires high complexity decision making for assessment and support, frequent evaluation and titration of therapies, application of advanced monitoring technologies and extensive interpretation of multiple databases. Critical Care Time devoted to patient care services described in this note is 30 minutes.   Lavon Paganini. Titus Mould, MD, [place] Pgr: [place] [facility]  Pulmonary and [place] Pager: [phone]  06/28/2013, 11:05 AM

## 2013-06-29 ENCOUNTER — Inpatient Hospital Stay (HOSPITAL_COMMUNITY): Payer: BC Managed Care – PPO

## 2013-06-29 LAB — POCT I-STAT 3, ART BLOOD GAS (G3+)
Acid-Base Excess: 6 mmol/L — ABNORMAL HIGH (ref 0.0–2.0)
BICARBONATE: 31.7 meq/L — AB (ref 20.0–24.0)
O2 Saturation: 96 %
PCO2 ART: 48.8 mmHg — AB (ref 35.0–45.0)
PH ART: 7.416 (ref 7.350–7.450)
PO2 ART: 76 mmHg — AB (ref 80.0–100.0)
Patient temperature: 96.9
TCO2: 33 mmol/L (ref 0–100)

## 2013-06-29 LAB — CBC WITH DIFFERENTIAL/PLATELET
Basophils Absolute: 0.1 10*3/uL (ref 0.0–0.1)
Basophils Relative: 1 % (ref 0–1)
EOS PCT: 2 % (ref 0–5)
Eosinophils Absolute: 0.3 10*3/uL (ref 0.0–0.7)
HCT: 43.2 % (ref 39.0–52.0)
Hemoglobin: 13.6 g/dL (ref 13.0–17.0)
LYMPHS ABS: 1.7 10*3/uL (ref 0.7–4.0)
Lymphocytes Relative: 14 % (ref 12–46)
MCH: 30.2 pg (ref 26.0–34.0)
MCHC: 31.5 g/dL (ref 30.0–36.0)
MCV: 96 fL (ref 78.0–100.0)
Monocytes Absolute: 1.8 10*3/uL — ABNORMAL HIGH (ref 0.1–1.0)
Monocytes Relative: 15 % — ABNORMAL HIGH (ref 3–12)
NEUTROS PCT: 68 % (ref 43–77)
Neutro Abs: 8.3 10*3/uL — ABNORMAL HIGH (ref 1.7–7.7)
Platelets: 365 10*3/uL (ref 150–400)
RBC: 4.5 MIL/uL (ref 4.22–5.81)
RDW: 18.3 % — AB (ref 11.5–15.5)
WBC: 12.2 10*3/uL — ABNORMAL HIGH (ref 4.0–10.5)

## 2013-06-29 LAB — GLUCOSE, CAPILLARY
GLUCOSE-CAPILLARY: 141 mg/dL — AB (ref 70–99)
GLUCOSE-CAPILLARY: 153 mg/dL — AB (ref 70–99)
Glucose-Capillary: 150 mg/dL — ABNORMAL HIGH (ref 70–99)
Glucose-Capillary: 165 mg/dL — ABNORMAL HIGH (ref 70–99)
Glucose-Capillary: 168 mg/dL — ABNORMAL HIGH (ref 70–99)
Glucose-Capillary: 175 mg/dL — ABNORMAL HIGH (ref 70–99)

## 2013-06-29 LAB — BASIC METABOLIC PANEL
BUN: 29 mg/dL — AB (ref 6–23)
CALCIUM: 9.4 mg/dL (ref 8.4–10.5)
CO2: 30 meq/L (ref 19–32)
CREATININE: 1.52 mg/dL — AB (ref 0.50–1.35)
Chloride: 121 mEq/L — ABNORMAL HIGH (ref 96–112)
GFR calc Af Amer: 55 mL/min — ABNORMAL LOW (ref >90)
GFR, EST NON AFRICAN AMERICAN: 47 mL/min — AB (ref >90)
Glucose, Bld: 169 mg/dL — ABNORMAL HIGH (ref 70–99)
Potassium: 3.4 mEq/L — ABNORMAL LOW (ref 3.7–5.3)
SODIUM: 161 meq/L — AB (ref 137–147)

## 2013-06-29 LAB — MAGNESIUM: Magnesium: 1.9 mg/dL (ref 1.5–2.5)

## 2013-06-29 LAB — LUTEINIZING HORMONE: LH: <0.1 m[IU]/mL — ABNORMAL LOW (ref 1.5–9.3)

## 2013-06-29 LAB — PRO B NATRIURETIC PEPTIDE: Pro B Natriuretic peptide (BNP): 1004 pg/mL — ABNORMAL HIGH (ref 0–125)

## 2013-06-29 LAB — PHOSPHORUS: Phosphorus: 3.1 mg/dL (ref 2.3–4.6)

## 2013-06-29 LAB — FOLLICLE STIMULATING HORMONE: FSH: <0.3 m[IU]/mL — ABNORMAL LOW (ref 1.4–18.1)

## 2013-06-29 LAB — PROLACTIN: Prolactin: >1000 ng/mL — ABNORMAL HIGH (ref 2.1–17.1)

## 2013-06-29 LAB — GROWTH HORMONE: Growth Hormone: 1.88 ng/mL — ABNORMAL HIGH (ref 0.00–3.00)

## 2013-06-29 LAB — ACTH: C206 ACTH: 48 pg/mL — ABNORMAL HIGH (ref 10–46)

## 2013-06-29 MED ORDER — MAGNESIUM SULFATE 50 % IJ SOLN
2.0000 g | Freq: Once | INTRAVENOUS | Status: DC
  Filled 2013-06-29: qty 4

## 2013-06-29 MED ORDER — LABETALOL HCL 5 MG/ML IV SOLN
10.0000 mg | INTRAVENOUS | Status: DC
  Administered 2013-06-29 – 2013-07-02 (×18): 10 mg via INTRAVENOUS
  Filled 2013-06-29 (×26): qty 4

## 2013-06-29 MED ORDER — CARVEDILOL 6.25 MG PO TABS
6.2500 mg | ORAL_TABLET | Freq: Two times a day (BID) | ORAL | Status: DC
  Administered 2013-06-29: 6.25 mg via ORAL
  Filled 2013-06-29 (×6): qty 1

## 2013-06-29 MED ORDER — POTASSIUM CHLORIDE 10 MEQ/50ML IV SOLN
10.0000 meq | INTRAVENOUS | Status: AC
  Administered 2013-06-29 (×2): 10 meq via INTRAVENOUS
  Filled 2013-06-29 (×2): qty 50

## 2013-06-29 MED ORDER — HALOPERIDOL LACTATE 5 MG/ML IJ SOLN
5.0000 mg | Freq: Once | INTRAMUSCULAR | Status: AC
  Administered 2013-06-29: 5 mg via INTRAVENOUS

## 2013-06-29 MED ORDER — MAGNESIUM SULFATE 40 MG/ML IJ SOLN
2.0000 g | Freq: Once | INTRAMUSCULAR | Status: AC
  Administered 2013-06-29: 2 g via INTRAVENOUS
  Filled 2013-06-29: qty 50

## 2013-06-29 MED ORDER — CLONIDINE HCL 0.3 MG/24HR TD PTWK
0.3000 mg | MEDICATED_PATCH | TRANSDERMAL | Status: DC
  Administered 2013-06-29 – 2013-07-06 (×2): 0.3 mg via TRANSDERMAL
  Filled 2013-06-29 (×2): qty 1

## 2013-06-29 MED ORDER — VITAL AF 1.2 CAL PO LIQD
1000.0000 mL | ORAL | Status: DC
  Filled 2013-06-29 (×2): qty 1000

## 2013-06-29 MED ORDER — VITAL AF 1.2 CAL PO LIQD
1000.0000 mL | ORAL | Status: DC
  Administered 2013-06-29: 1000 mL
  Filled 2013-06-29 (×8): qty 1000

## 2013-06-29 MED ORDER — HALOPERIDOL LACTATE 5 MG/ML IJ SOLN
INTRAMUSCULAR | Status: AC
  Filled 2013-06-29: qty 1

## 2013-06-29 MED ORDER — HYDRALAZINE HCL 20 MG/ML IJ SOLN
10.0000 mg | INTRAMUSCULAR | Status: DC
  Administered 2013-06-29 – 2013-07-02 (×19): 10 mg via INTRAVENOUS
  Filled 2013-06-29: qty 0.5
  Filled 2013-06-29 (×2): qty 1
  Filled 2013-06-29 (×7): qty 0.5
  Filled 2013-06-29: qty 1
  Filled 2013-06-29 (×8): qty 0.5
  Filled 2013-06-29: qty 1
  Filled 2013-06-29 (×3): qty 0.5

## 2013-06-29 MED ORDER — POTASSIUM CHLORIDE CRYS ER 20 MEQ PO TBCR: 20.0000 meq | EXTENDED_RELEASE_TABLET | ORAL | Status: DC

## 2013-06-29 NOTE — Progress Notes (Signed)
[facility] ADULT ICU REPLACEMENT PROTOCOL FOR AM LAB REPLACEMENT ONLY  The patient does apply for the [facility] Adult ICU Electrolyte Replacment Protocol based on the criteria listed below:   1. Is GFR >/= 40 ml/min? yes  Patient's GFR today is 55 2. Is urine output >/= 0.5 ml/kg/hr for the last 6 hours? yes Patient's UOP is 0.6 ml/kg/hr 3. Is BUN < 60 mg/dL? yes  Patient's BUN today is 29 4. Abnormal electrolyte(s):K 3.4 5. Ordered repletion with: per protocol 6. If a panic level lab has been reported, has the CCM MD in charge been notified? no.   Physician:    Ronda Fairly A 06/29/2013 5:32 AM
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
## 2013-06-29 NOTE — Progress Notes (Signed)
Subjective: Remains extubated on CPAP with no new complaints. No overnight adverse clinical events reported. He is slightly less confused today compared to yesterday.  Objective: Current vital signs: BP 174/98  Pulse 80  Temp(Src) 96.9 F (36.1 C) (Axillary)  Resp 20  Ht 6' (1.829 m)  Wt 179.2 kg (395 lb 1 oz)  BMI 53.57 kg/m2  SpO2 93%  Neurologic Exam: Alert and in no acute distress. He followed commands well. Speech is difficult to understand because of the CPAP mask and hoarseness. Extraocular movements were full and conjugate. No facial weakness was noted. Patient moved extremities equally with no signs of focal disproportionate weakness.  Medications: I have reviewed the patient's current medications.  Assessment/Plan: Encephalopathic process secondary to DKA and hypertensive emergency, with seizure activity on presentation. He said no further seizure activity in mental status is markedly improved. Patient self-extubated himself 2 days ago and has remained extubated. He showing no signs of focal weakness. Acute stroke is unlikely.  Recommend no changes in current management other than discontinuing Keppra. Per is to be reinstituted if patient has a recurrent seizure. This is unlikely as long as his blood pressure is controlled.  I will plan to see him again on a when necessary basis following this visit.  C.R. Nicole Kindred, MD [facility] [phone]  06/29/2013  11:00 AM

## 2013-06-29 NOTE — Progress Notes (Signed)
eLink Physician-Brief Progress Note Patient Name: Blake Bell DOB: [birth year] MRN: [id number]  Date of Service  06/29/2013   HPI/Events of Note     eICU Interventions  Repeat dose haldol 5mg    Intervention Category Minor Interventions: Agitation / anxiety - evaluation and management  Mattthew Ziomek S. 06/29/2013, 5:17 AM

## 2013-06-29 NOTE — Evaluation (Signed)
Clinical/Bedside Swallow Evaluation Patient Details  Name: Blake Bell MRN: [id number] Date of Birth: 1950-11-07  Today's Date: 06/29/2013 Time: 8841-6606 SLP Time Calculation (min): 17 min  Past Medical History:  Past Medical History  Diagnosis Date  . Hypertension   . Diabetes mellitus without complication   . Sleep apnea     uses cpap, setting of 5  . Chronic kidney disease     ssees dr Florene Glen nephrology every 6-9 months  . Multiple wounds     on lower legs - sees [name] Pain at the [place]   Past Surgical History:  Past Surgical History  Procedure Laterality Date  . Colonscopy  6-7 yrs ago  . Colonoscopy N/A 04/05/2013    Procedure: COLONOSCOPY;  Surgeon: Juanita Craver, MD;  Location: [facility] ENDOSCOPY;  Service: Endoscopy;  Laterality: N/A;   HPI:  63 yo male with altered mental status and seizures. Intubated for airway protection. BP in ER 214/148. PCCM asked to admit to ICU. Self extubated 06/27/13. Requires 6L O2 to maintain oxygenation. Very lethargic since extubation and difficult to arouse/assess. Follows some commands for nursing. On and off BiPAP every 4 hours. Pt needs to take oral meds per MD.   Assessment / Plan / Recommendation Clinical Impression  Pt presents with multiple factors resulting in high aspiration risk with PO intake. His mentation waxes and wanes, with SLP he would not follow most commands for swallowing or PO manipulation, holding bolus for extended period. He is on and off BiPAP following a 7 day period of intubation with hoarse vocal quality and probable airway edema. His respiratory status appears tenuous with RR increasing to 40s with brief PO trials. Overall, would not recommend any POs for this pt until menation and respiration improve. MD/RN wonder about meds in puree. Unlikely that pt would aspirate meds/puree, but oral holding of bolus and lethargy will make oral intake of meds difficult. May be preferable to rely on non oral means until pt is  more consistent. SLP will follow for trials.     Aspiration Risk  Severe    Diet Recommendation NPO   Medication Administration: Via alternative means    Other  Recommendations Oral Care Recommendations: Oral care Q4 per protocol   Follow Up Recommendations  Skilled Nursing facility    Frequency and Duration min 2x/week  2 weeks   Pertinent Vitals/Pain NA    SLP Swallow Goals     Swallow Study Prior Functional Status       General HPI: 63 yo male with altered mental status and seizures. Intubated for airway protection. BP in ER 214/148. PCCM asked to admit to ICU. Self extubated 06/27/13. Requires 6L O2 to maintain oxygenation. Very lethargic since extubation and difficult to arouse/assess. Follows some commands for nursing. On and off BiPAP every 4 hours. Pt needs to take oral meds per MD. Type of Study: Bedside swallow evaluation Diet Prior to this Study: NPO Temperature Spikes Noted: No Respiratory Status: Nasal cannula History of Recent Intubation: Yes Length of Intubations (days): 7 days Date extubated: 06/27/13 (self extubted) Behavior/Cognition: Confused;Requires cueing;Doesn't follow directions;Decreased sustained attention Oral Cavity - Dentition: Adequate natural dentition Self-Feeding Abilities: Total assist Patient Positioning: Upright in bed Baseline Vocal Quality: Low vocal intensity;Hoarse Volitional Cough: Cognitively unable to elicit Volitional Swallow: Unable to elicit    Oral/Motor/Sensory Function Overall Oral Motor/Sensory Function: Other (comment) (does not follow commands to complete)   Ice Chips Ice chips: Impaired Presentation: Spoon Oral Phase Impairments: Reduced lingual movement/coordination;Impaired anterior to  posterior transit;Poor awareness of bolus Oral Phase Functional Implications: Prolonged oral transit;Oral holding Pharyngeal Phase Impairments: Suspected delayed Swallow;Decreased hyoid-laryngeal movement;Change in Vital Signs   Thin  Liquid Thin Liquid: Not tested    Nectar Thick Nectar Thick Liquid: Not tested   Honey Thick Honey Thick Liquid: Not tested   Puree Puree: Impaired Presentation: Spoon Oral Phase Impairments: Reduced labial seal;Reduced lingual movement/coordination;Impaired anterior to posterior transit;Poor awareness of bolus Oral Phase Functional Implications: Oral holding;Prolonged oral transit Pharyngeal Phase Impairments: Suspected delayed Swallow;Change in Vital Signs   Solid   GO    Solid: Not tested      Herbie Baltimore, MA CCC-SLP [phone]  Lynann Beaver 06/29/2013,12:29 PM

## 2013-06-29 NOTE — Progress Notes (Signed)
Name: Blake Bell MRN: [id number] DOB: [birth year]    ADMISSION DATE:  06/21/2013  REFERRING MD : EDP   CHIEF COMPLAINT: Altered mental status   BRIEF PATIENT DESCRIPTION:  63 yo male with altered mental status and seizures. Intubated for airway protection. BP in ER 214/148. PCCM asked to admit to ICU. Self extubated 06/27/13. Requires 6L O2 to maintain oxygenation. Very lethargic since extubation and difficult to arouse/assess. Follows some commands for nursing.  SIGNIFICANT EVENTS:  1/5 Admit, neurology consulted  1/6 Off insulin gtt  1/7 Possible aspiration of tube feeds  1/11 Self extubated, put on BiPAP  1/12 remains on nicardipine  1/13 WBC trending up, BIPAP improving  STUDIES:  1/5 CT head >> mild atrophy with patchy small vessel disease, ?aneurysm Rt superior to sella  1/5 CT angio head >> no aneurysm, 13 mm mass Rt sella/suprasellar region  1/6 EEG >> normal sleep EEG  1/8 Echo >> mild LVH, EF 55 to 60%, mod LA dilation 1/12 CXR >> Slight improvement in congestive heart failure or interstitial edema  LINES / TUBES:  1/5 ETT>>> 1/11  1/7 Rt PICC>>>   CULTURES:  1/5 BC>>> Neg  1/5 MRSA >> positive   ANTIBIOTICS:  Acyclovir 1/5>>>1/8   SUBJECTIVE: Asleep in bed with BiPAP on, unarousable from sleep. Vitals stable and appears in no distress.  VITAL SIGNS: Temp:  [97.6 F (36.4 C)-99.1 F (37.3 C)] 97.6 F (36.4 C) (01/13 0430) Pulse Rate:  [40-119] 92 (01/13 0700) Resp:  [12-32] 18 (01/13 0600) BP: (110-195)/(67-143) 173/106 mmHg (01/13 0700) SpO2:  [85 %-100 %] 94 % (01/13 0700) FiO2 (%):  [50 %-70 %] 70 % (01/13 0700) Weight:  [179.2 kg (395 lb 1 oz)] 179.2 kg (395 lb 1 oz) (01/13 0228) HEMODYNAMICS:   VENTILATOR SETTINGS: Vent Mode:  [-] BIPAP FiO2 (%):  [50 %-70 %] 70 % Set Rate:  [12 bmp-16 bmp] 12 bmp PEEP:  [6 cmH20] 6 cmH20 INTAKE / OUTPUT: Intake/Output     01/12 0701 - 01/13 0700 01/13 0701 - 01/14 0700   I.V. (mL/kg) 2509.8 (14)    IV Piggyback 409    Total Intake(mL/kg) 2918.8 (16.3)    Urine (mL/kg/hr) 3475 (0.8)    Total Output 3475     Net -556.2          Urine Occurrence 7 x      PHYSICAL EXAMINATION: General: Asleep, NAD  Neuro: unable to assess  HEENT: PERRL  Cardiovascular: distant heart sounds due to body habitus, RRR, S1, S2 present, No murmur, rubs, gallops  Lungs: coarse and distant breath sounds, no wheezes  Abdomen: obese, soft, non tender, non distended  Musculoskeletal: 1+ non pitting edema B/L LE  Skin: chronic venous stasis changes; bilateral LE wrapped in gauze  LABS:  CBC  Recent Labs Lab 06/26/13 0521 06/27/13 0417 06/29/13 0330  WBC 10.0 10.6* 12.2*  HGB 11.9* 12.3* 13.6  HCT 37.6* 38.1* 43.2  PLT 334 320 365   Coag's No results found for this basename: APTT, INR,  in the last 168 hours BMET  Recent Labs Lab 06/27/13 0417 06/28/13 0356 06/29/13 0330  NA 156* 156* 161*  K 3.5* 3.4* 3.4*  CL 118* 118* 121*  CO2 _0 BUN 44* 37* 29*  CREATININE 1.55* 1.48* 1.52*  GLUCOSE 160* 183* 169*   Electrolytes  Recent Labs Lab 06/22/13 0905  06/23/13 0240  06/24/13 0530  06/27/13 0417 06/28/13 0356 06/29/13 0330  CALCIUM 8.2*  < >  8.2*  < > 8.8  < > 8.8 8.8 9.4  MG 1.5  --  1.6  --  1.8  --   --   --  1.9  PHOS 2.7  --  3.6  --   --   --   --   --  3.1  < > = values in this interval not displayed. Sepsis Markers No results found for this basename: LATICACIDVEN, PROCALCITON, O2SATVEN,  in the last 168 hours ABG  Recent Labs Lab 06/28/13 2000  PHART 7.352  PCO2ART 48.9*  PO2ART 192.0*   Liver Enzymes  Recent Labs Lab 06/23/13 0240  AST 12  ALT 19  ALKPHOS 100  BILITOT <0.2*  ALBUMIN 1.9*   Cardiac Enzymes  Recent Labs Lab 06/29/13 0330  PROBNP 1004.0*   Glucose  Recent Labs Lab 06/28/13 0748 06/28/13 1154 06/28/13 1608 06/28/13 1959 06/28/13 2357 06/29/13 0427  GLUCAP 172* 196* 160* 138* 150* 175*    Imaging Dg Chest Port 1  View  06/29/2013   CLINICAL DATA:  Respiratory failure .  EXAM: PORTABLE CHEST - 1 VIEW  COMPARISON:  06/28/2013.  FINDINGS: Central line in stable position. Persistent cardiomegaly with pulmonary vascular prominence remains. Interstitial pulmonary edema has improved from prior exam. No pleural effusion or pneumothorax. No acute bony abnormality.  IMPRESSION: Slight improvement in congestive heart failure or interstitial edema .   Electronically Signed   By: Thomas  Register   On: 06/29/2013 07:44   Dg Chest Port 1 View  06/28/2013   CLINICAL DATA:  Pneumonia  EXAM: PORTABLE CHEST - 1 VIEW  COMPARISON:  June 27, 2013  FINDINGS: The lungs are borderline hypoinflated. The interstitial markings are diffusely increased but not significantly changed since yesterday's study. The cardiopericardial silhouette remains enlarged. The pulmonary vascularity remains prominent centrally and in the upper lobes. A right-sided subclavian venous catheter has its tip in the region of the distal portion of the SVC. The esophagogastric tube and endotracheal tube have been withdrawn.  IMPRESSION: The lungs are slightly better inflated today. The interstitial markings remain increased bilaterally consistent with pneumonia or other interstitial process. Confluent lung markings in the right infrahilar region may reflect infiltrate or subsegmental atelectasis.   Electronically Signed   By: David  Jordan   On: 06/28/2013 07:39    CXR: Slight improvement in congestive heart failure or interstitial edema  ASSESSMENT / PLAN:   PULMONARY  A:  Acute respiratory failure in setting of encephalopathy and seizures.  Hx of OSA, uses CPAP at home >> followed by Dr. Clance as outpt.  Self extubated 1/11  BiPAP at night and [place] during day @ 6L, O2 sats acceptable P:  Continue O2 support @ 6L, goal O2>92% with end goal of weaning off  Unable to use CPAP for now, favor BIPAP, would schedule this 4 hron 4 hrs off x 24 hrs Need neg  balance, may be limited by NA pcxr in am  abg reviewed  CARDIOVASCULAR  A:  Malignant HTN, 1/12 neg balance, needs to continue P:  Goal SBP < 170 , goals met thus far, will continue to add on antihypertensive therapy Lasix hold with na 160 Consider addition coreg, norvasc with NG if unable to swallow Continue clonidine patch 0.2 mg, increase Continue cardene gtt Start around the clock, scheduled hydralazine, labetolol Hold outpt enalapril, losartan, aldactone Will consider NGT for TF and meds if fails swallow eval  RENAL  A:  Acute kidney injury >> improving.  Stage 3 CKD.    Hypokalemia.  Hypernatremia worsening hypomag Volume overload ? P:  Trend BMET  Replete K x 4 runs  Lasix dc Continue form of free water Continue foley for now  GASTROINTESTINAL  A:  Nutrition.  Morbid obesity.  ?aspiration event 1/6.  P:  NPO  SLP to perform swallow eval  If unable then place NGT and feed Protonix IV   HEMATOLOGIC  A:  Leukocytosis = hemoconcetration P:  SQH for dvt ppx- continue  D/c SCDs (aggravating stasis ulcers of LE)  f/u CBC am   INFECTIOUS  A:  Chronic stasis ulcers of lower extremities  Increasing leukocytosis, 10.6>>12.2 Afebrile P:  Draw blood cultures if spikes fever F/u CBC in am aquacel per wound care team   ENDOCRINE  A:  DM type II with hyperglycemia.  P:  SSI - resistant  Will resume lantus with diet or TF if started Continue to hold outpt glyburide, metformin   NEUROLOGIC  A:  Acute encephalopathy most likely 2nd to malignant HTN complicated by seizures. Less likely menigitis/HSV >> unable to do MRI due to pt body habitus.  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  TSH WNL P:  Continue keppra per neurology  Will continue to control BP and add meds F/u GH, LH, FSH, ACTH, Prolactin   TODAY'S SUMMARY: add IV additional BP control, may need ngt  Dwyane Dee, MSIV, Medical Student 06/29/2013 8:22 AM  I have personally obtained a  history, examined the patient, evaluated laboratory and imaging results, formulated the assessment and plan and placed orders. CRITICAL CARE: The patient is critically ill with multiple organ systems failure and requires high complexity decision making for assessment and support, frequent evaluation and titration of therapies, application of advanced monitoring technologies and extensive interpretation of multiple databases. Critical Care Time devoted to patient care services described in this note is .35 minutes.   Lavon Paganini. Titus Mould, MD, FACP Pgr: [facility]  Pulmonary and [place] Pager: [phone]  06/29/2013, 7:46 AM

## 2013-06-29 NOTE — Progress Notes (Addendum)
MD paged regarding pt's sleepiness/decreased responsiveness since BiPAP removed at 10am. Order received to get ABG. RT notified. Will continue to monitor.  MD aware of ABG results per RT.    Latrelle Dodrill

## 2013-06-29 NOTE — Progress Notes (Signed)
Per Dr. Titus Mould, may hold off NGT placement at this time. SLP to see pt. Bipap off at this time.  Blake Bell

## 2013-06-29 NOTE — Progress Notes (Signed)
Changed potassium to IV per nurse request. Patient is NPO and on BiPap.

## 2013-06-29 NOTE — Progress Notes (Signed)
Patient taken off Bipap and placed on 6L nasal cannula at this time, will place patient back on Bipap in 4 hours or as needed. RT will continue to monitor.

## 2013-06-30 ENCOUNTER — Inpatient Hospital Stay (HOSPITAL_COMMUNITY): Payer: BC Managed Care – PPO

## 2013-06-30 LAB — BASIC METABOLIC PANEL
BUN: 27 mg/dL — ABNORMAL HIGH (ref 6–23)
BUN: 28 mg/dL — ABNORMAL HIGH (ref 6–23)
CHLORIDE: 119 meq/L — AB (ref 96–112)
CO2: 24 mEq/L (ref 19–32)
CO2: 27 mEq/L (ref 19–32)
CREATININE: 1.63 mg/dL — AB (ref 0.50–1.35)
Calcium: 8.3 mg/dL — ABNORMAL LOW (ref 8.4–10.5)
Calcium: 9.4 mg/dL (ref 8.4–10.5)
Chloride: 121 mEq/L — ABNORMAL HIGH (ref 96–112)
Creatinine, Ser: 1.56 mg/dL — ABNORMAL HIGH (ref 0.50–1.35)
GFR calc non Af Amer: 46 mL/min — ABNORMAL LOW (ref >90)
GFR calc non Af Amer: 44 mL/min — ABNORMAL LOW (ref >90)
GFR, EST AFRICAN AMERICAN: 53 mL/min — AB (ref >90)
GFR, EST AFRICAN AMERICAN: 51 mL/min — AB (ref >90)
Glucose, Bld: 169 mg/dL — ABNORMAL HIGH (ref 70–99)
Glucose, Bld: 191 mg/dL — ABNORMAL HIGH (ref 70–99)
POTASSIUM: 2.9 meq/L — AB (ref 3.7–5.3)
Potassium: 3.7 mEq/L (ref 3.7–5.3)
SODIUM: 158 meq/L — AB (ref 137–147)
SODIUM: 156 meq/L — AB (ref 137–147)

## 2013-06-30 LAB — CBC
HCT: 37.4 % — ABNORMAL LOW (ref 39.0–52.0)
HEMOGLOBIN: 11.6 g/dL — AB (ref 13.0–17.0)
MCH: 29.5 pg (ref 26.0–34.0)
MCHC: 31 g/dL (ref 30.0–36.0)
MCV: 95.2 fL (ref 78.0–100.0)
Platelets: 326 10*3/uL (ref 150–400)
RBC: 3.93 MIL/uL — AB (ref 4.22–5.81)
RDW: 17.8 % — ABNORMAL HIGH (ref 11.5–15.5)
WBC: 9.2 10*3/uL (ref 4.0–10.5)

## 2013-06-30 LAB — GLUCOSE, CAPILLARY
GLUCOSE-CAPILLARY: 179 mg/dL — AB (ref 70–99)
GLUCOSE-CAPILLARY: 120 mg/dL — AB (ref 70–99)
GLUCOSE-CAPILLARY: 165 mg/dL — AB (ref 70–99)
GLUCOSE-CAPILLARY: 188 mg/dL — AB (ref 70–99)
Glucose-Capillary: 151 mg/dL — ABNORMAL HIGH (ref 70–99)
Glucose-Capillary: 145 mg/dL — ABNORMAL HIGH (ref 70–99)
Glucose-Capillary: 150 mg/dL — ABNORMAL HIGH (ref 70–99)

## 2013-06-30 LAB — MAGNESIUM: MAGNESIUM: 1.8 mg/dL (ref 1.5–2.5)

## 2013-06-30 LAB — PHOSPHORUS: PHOSPHORUS: 3.5 mg/dL (ref 2.3–4.6)

## 2013-06-30 MED ORDER — WHITE PETROLATUM GEL
Status: AC
  Administered 2013-06-30: 1
  Filled 2013-06-30: qty 5

## 2013-06-30 MED ORDER — MAGNESIUM SULFATE 40 MG/ML IJ SOLN
2.0000 g | Freq: Once | INTRAMUSCULAR | Status: AC
  Administered 2013-06-30: 2 g via INTRAVENOUS
  Filled 2013-06-30: qty 50

## 2013-06-30 MED ORDER — MAGNESIUM SULFATE 50 % IJ SOLN
2.0000 g | Freq: Once | INTRAVENOUS | Status: DC
  Filled 2013-06-30: qty 4

## 2013-06-30 MED ORDER — POTASSIUM CHLORIDE 10 MEQ/50ML IV SOLN
10.0000 meq | INTRAVENOUS | Status: AC
  Administered 2013-06-30 (×4): 10 meq via INTRAVENOUS
  Filled 2013-06-30 (×5): qty 50

## 2013-06-30 NOTE — Progress Notes (Signed)
Name: Blake Bell MRN: [id number] DOB: 03/01/1951    ADMISSION DATE:  06/21/2013  REFERRING MD :  EDP PRIMARY SERVICE: CCM  CHIEF COMPLAINT:  AMS  BRIEF PATIENT DESCRIPTION: 63 yo male admitted with altered mental status and seizures. Intubated for airway protection. BP in ER 214/148. PCCM asked to admit to ICU. Self extubated 06/27/13. Requires 6L O2 to maintain oxygenation. Follows some commands but still lethargic, confused at times. Had some bigeminy overnight on [place], but resolved with BiPAP.  SIGNIFICANT EVENTS:  1/5 Admit, neurology consulted  1/6 Off insulin gtt  1/7 Possible aspiration of tube feeds  1/11 Self extubated, put on BiPAP  1/12 remains on nicardipine  1/13 WBC trending up, BIPAP improving 1/14- self dc'ed panda  STUDIES:  1/5 CT head >> mild atrophy with patchy small vessel disease, ?aneurysm Rt superior to sella  1/5 CT angio head >> no aneurysm, 13 mm mass Rt sella/suprasellar region  1/6 EEG >> normal sleep EEG  1/8 Echo >> mild LVH, EF 55 to 60%, mod LA dilation  1/12 CXR >> Slight improvement in congestive heart failure or interstitial edema   LINES / TUBES:  1/5 ETT>>> 1/11  1/7 Rt PICC>>>   CULTURES:  1/5 BC>>> Neg  1/5 MRSA >> positive   ANTIBIOTICS:  Acyclovir 1/5>>>1/8   SUBJECTIVE: Asleep in bed, very difficult to arouse and converse with. Nods head yes/no appropriately and nods no to pain or SOB. Can follow some commands in-between nodding off to sleep.  VITAL SIGNS: Temp:  [96.9 F (36.1 C)-98.5 F (36.9 C)] 98.1 F (36.7 C) (01/14 0400) Pulse Rate:  [46-112] 88 (01/14 0600) Resp:  [13-33] 20 (01/14 0500) BP: (114-183)/(67-119) 138/83 mmHg (01/14 0648) SpO2:  [88 %-100 %] 100 % (01/14 0600) FiO2 (%):  [60 %-70 %] 60 % (01/14 0303) Weight:  [179 kg (394 lb 10 oz)] 179 kg (394 lb 10 oz) (01/14 0500) HEMODYNAMICS:   VENTILATOR SETTINGS: Vent Mode:  [-] BIPAP FiO2 (%):  [60 %-70 %] 60 % Set Rate:  [12 bmp] 12 bmp PEEP:  [6  cmH20] 6 cmH20 INTAKE / OUTPUT: Intake/Output     01/13 0701 - 01/14 0700 01/14 0701 - 01/15 0700   I.V. (mL/kg) 1545.7 (8.6)    NG/GT 130    IV Piggyback 255    Total Intake(mL/kg) 1930.7 (10.8)    Urine (mL/kg/hr) 3020 (0.7)    Total Output 3020     Net -1089.4            PHYSICAL EXAMINATION: General: Very somnolent/lethargic, difficult to arouse, follows some commands Neuro: oriented to name only, unable to stay awake long enough for remaining questions HEENT: PERRL, EOMI, no icterus or pallor Cardiovascular: distant heart sounds due to body habitus, RRR, S1, S2 present, No murmur, rubs, gallops  Lungs: distant breath sounds, no wheezes Abdomen: obese, soft, non tender, non distended  Musculoskeletal: trace non pitting edema B/L LE - improved  Skin: chronic venous stasis changes; bilateral LE wrapped in gauze  LABS:  CBC  Recent Labs Lab 06/27/13 0417 06/29/13 0330 06/30/13 0500  WBC 10.6* 12.2* 9.2  HGB 12.3* 13.6 11.6*  HCT 38.1* 43.2 37.4*  PLT 320 365 326   Coag's No results found for this basename: APTT, INR,  in the last 168 hours BMET  Recent Labs Lab 06/27/13 0417 06/28/13 0356 06/29/13 0330  NA 156* 156* 161*  K 3.5* 3.4* 3.4*  CL 118* 118* 121*  CO2 29 27 30  BUN 44* 37* 29*  CREATININE 1.55* 1.48* 1.52*  GLUCOSE 160* 183* 169*   Electrolytes  Recent Labs Lab 06/24/13 0530  06/27/13 0417 06/28/13 0356 06/29/13 0330  CALCIUM 8.8  < > 8.8 8.8 9.4  MG 1.8  --   --   --  1.9  PHOS  --   --   --   --  3.1  < > = values in this interval not displayed. Sepsis Markers No results found for this basename: LATICACIDVEN, PROCALCITON, O2SATVEN,  in the last 168 hours ABG  Recent Labs Lab 06/28/13 2000 06/29/13 1147  PHART 7.352 7.416  PCO2ART 48.9* 48.8*  PO2ART 192.0* 76.0*   Liver Enzymes No results found for this basename: AST, ALT, ALKPHOS, BILITOT, ALBUMIN,  in the last 168 hours Cardiac Enzymes  Recent Labs Lab 06/29/13 0330    PROBNP 1004.0*   Glucose  Recent Labs Lab 06/29/13 0427 06/29/13 0821 06/29/13 1133 06/29/13 1612 06/29/13 2050 06/30/13 0110  GLUCAP 175* 168* 141* 153* 165* 151*    Imaging Dg Chest Port 1 View  06/29/2013   CLINICAL DATA:  Respiratory failure .  EXAM: PORTABLE CHEST - 1 VIEW  COMPARISON:  06/28/2013.  FINDINGS: Central line in stable position. Persistent cardiomegaly with pulmonary vascular prominence remains. Interstitial pulmonary edema has improved from prior exam. No pleural effusion or pneumothorax. No acute bony abnormality.  IMPRESSION: Slight improvement in congestive heart failure or interstitial edema .   Electronically Signed   By: Marcello Moores  Register   On: 06/29/2013 07:44   Dg Abd Portable 1v  06/29/2013   CLINICAL DATA:  panda placement  EXAM: PORTABLE ABDOMEN - 1 VIEW  COMPARISON:  None.  FINDINGS: Feeding tube is identified with tip below the diaphragm in the left upper quadrant.  IMPRESSION: Feeding tube with tip in anticipated position of the stomach.   Electronically Signed   By: Skipper Cliche M.D.   On: 06/29/2013 16:27   CXR: 1/12 >> Slight improvement in congestive heart failure or interstitial edema   ASSESSMENT / PLAN:   PULMONARY  A:  Acute respiratory failure in setting of encephalopathy and seizures.  Hx of OSA, uses CPAP at home >> followed by Dr. Gwenette Greet as outpt.  Bigeminy on [place] P:  Trial off BiPAP again today. Continue O2 support, goal O2>92% with end goal of weaning off hopefully Needs nocturnal BIPAP for sure Consider longer periods off NIMV during day Maintaining good negative balance, continue  CARDIOVASCULAR  A:  Malignant HTN, currently controlled off nicardipine P:  Goal SBP < 170 , goals met thus far, will continue to add on antihypertensive therapy  Continue hydralazine, labetalol Continue clonidine 0.3 patch If tube replaced again want to restart these agents: coreg, norvasc  Hold enalapril, losartan, aldactone, lasix Wean  cardene drip to OFF - successful Will re-consider NGT/panda for TF and meds if BP climbs after d/c cardene  Re slp  RENAL  A:  Acute kidney injury >> improving.  Stage 3 CKD.  Hypokalemia - 2.9 Hypernatremia - slight improvement Low mag P:  Trend BMET   Continue foley for now  Replete K, 4 runs now, then repeat BMET replace mag d5w increase Hope once slow correct na will wake up more and eat his HTN meds  GASTROINTESTINAL  A:  Patient pulled out panda tube yesterday Nutrition.  Morbid obesity.  ?aspiration event 1/6.  P:  Keep NPO per SLP until mentation and respiration improves, hope to repeat Protonix IV  Leave  panda out for now  HEMATOLOGIC  A:  Leukocytosis resolved P:  SQH for dvt ppx- continue  D/c SCDs (aggravating stasis ulcers of LE)   INFECTIOUS  A:  Chronic stasis ulcers of lower extremities  No leukocytosis  Afebrile  P:  wound care team   ENDOCRINE  A:  DM type II with hyperglycemia.  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  TSH and GH WNL, LH and FSH low, ACTH mild elev, Prolactin severely elevated (>1000 ng/mL) P:  Keppra d/c per neuro Will continue to control BP and add meds as pt able to swallow Will need workup for suprasellar mass (e.g. Hyperprolactinemia) Correct Na further  TODAY'S SUMMARY: correct na, space out off NIMV further, may need ngt back, remains off nicardipine  I have personally obtained a history, examined the patient, evaluated laboratory and imaging results, formulated the assessment and plan and placed orders. CRITICAL CARE: The patient is critically ill with multiple organ systems failure and requires high complexity decision making for assessment and support, frequent evaluation and titration of  therapies, application of advanced monitoring technologies and extensive interpretation of multiple databases. Critical Care Time devoted to patient care services described in this note is 30 minutes.   Lavon Paganini. Titus Mould, MD, [place] Pgr: [facility]  Pulmonary and [place] Pager: [phone]  06/30/2013, 7:10 AM

## 2013-07-01 ENCOUNTER — Inpatient Hospital Stay (HOSPITAL_COMMUNITY): Payer: BC Managed Care – PPO

## 2013-07-01 DIAGNOSIS — J811 Chronic pulmonary edema: Secondary | ICD-10-CM

## 2013-07-01 LAB — BLOOD GAS, ARTERIAL
Acid-Base Excess: 1.7 mmol/L (ref 0.0–2.0)
Bicarbonate: 27.1 meq/L — ABNORMAL HIGH (ref 20.0–24.0)
Delivery systems: POSITIVE
Expiratory PAP: 6
FIO2: 0.4 %
Inspiratory PAP: 14
Mode: POSITIVE
O2 Saturation: 97.3 %
Patient temperature: 98.6
TCO2: 28.7 mmol/L (ref 0–100)
pCO2 arterial: 52.8 mmHg — ABNORMAL HIGH (ref 35.0–45.0)
pH, Arterial: 7.33 — ABNORMAL LOW (ref 7.350–7.450)
pO2, Arterial: 102 mmHg — ABNORMAL HIGH (ref 80.0–100.0)

## 2013-07-01 LAB — BASIC METABOLIC PANEL
BUN: 26 mg/dL — ABNORMAL HIGH (ref 6–23)
CALCIUM: 9.3 mg/dL (ref 8.4–10.5)
CO2: 22 mEq/L (ref 19–32)
Chloride: 120 mEq/L — ABNORMAL HIGH (ref 96–112)
Creatinine, Ser: 1.61 mg/dL — ABNORMAL HIGH (ref 0.50–1.35)
GFR calc Af Amer: 51 mL/min — ABNORMAL LOW (ref >90)
GFR calc non Af Amer: 44 mL/min — ABNORMAL LOW (ref >90)
GLUCOSE: 134 mg/dL — AB (ref 70–99)
POTASSIUM: 3.9 meq/L (ref 3.7–5.3)
SODIUM: 156 meq/L — AB (ref 137–147)

## 2013-07-01 LAB — GLUCOSE, CAPILLARY
GLUCOSE-CAPILLARY: 140 mg/dL — AB (ref 70–99)
Glucose-Capillary: 128 mg/dL — ABNORMAL HIGH (ref 70–99)
Glucose-Capillary: 134 mg/dL — ABNORMAL HIGH (ref 70–99)
Glucose-Capillary: 101 mg/dL — ABNORMAL HIGH (ref 70–99)

## 2013-07-01 LAB — CBC
HCT: 42.7 % (ref 39.0–52.0)
Hemoglobin: 13.3 g/dL (ref 13.0–17.0)
MCH: 29.2 pg (ref 26.0–34.0)
MCHC: 31.1 g/dL (ref 30.0–36.0)
MCV: 93.8 fL (ref 78.0–100.0)
Platelets: 288 10*3/uL (ref 150–400)
RBC: 4.55 MIL/uL (ref 4.22–5.81)
RDW: 17.8 % — ABNORMAL HIGH (ref 11.5–15.5)
WBC: 9.8 10*3/uL (ref 4.0–10.5)

## 2013-07-01 LAB — SODIUM: Sodium: 158 mEq/L — ABNORMAL HIGH (ref 137–147)

## 2013-07-01 MED ORDER — HALOPERIDOL LACTATE 5 MG/ML IJ SOLN
4.0000 mg | Freq: Four times a day (QID) | INTRAMUSCULAR | Status: DC | PRN
  Administered 2013-07-01 – 2013-07-04 (×7): 4 mg via INTRAVENOUS
  Filled 2013-07-01 (×7): qty 1

## 2013-07-01 MED ORDER — AMLODIPINE BESYLATE 10 MG PO TABS
10.0000 mg | ORAL_TABLET | Freq: Every day | ORAL | Status: DC
  Administered 2013-07-01 – 2013-07-13 (×13): 10 mg via ORAL
  Filled 2013-07-01 (×13): qty 1

## 2013-07-01 MED ORDER — CARVEDILOL 12.5 MG PO TABS
12.5000 mg | ORAL_TABLET | Freq: Two times a day (BID) | ORAL | Status: DC
  Administered 2013-07-01 – 2013-07-04 (×7): 12.5 mg via ORAL
  Filled 2013-07-01 (×8): qty 1

## 2013-07-01 MED ORDER — CARVEDILOL 12.5 MG PO TABS
12.5000 mg | ORAL_TABLET | Freq: Once | ORAL | Status: AC
  Administered 2013-07-01: 12.5 mg via ORAL
  Filled 2013-07-01: qty 1

## 2013-07-01 MED ORDER — HALOPERIDOL LACTATE 5 MG/ML IJ SOLN
2.0000 mg | Freq: Once | INTRAMUSCULAR | Status: AC
  Administered 2013-07-01: 2 mg via INTRAMUSCULAR
  Filled 2013-07-01: qty 1

## 2013-07-01 MED ORDER — FUROSEMIDE 10 MG/ML IJ SOLN
60.0000 mg | Freq: Once | INTRAMUSCULAR | Status: AC
  Administered 2013-07-01: 60 mg via INTRAVENOUS
  Filled 2013-07-01: qty 6

## 2013-07-01 MED ORDER — DEXTROSE 5 % IV SOLN
INTRAVENOUS | Status: DC
Start: 2013-07-01 — End: 2013-07-04
  Administered 2013-07-01: 18:00:00 via INTRAVENOUS
  Administered 2013-07-01: 125 mL via INTRAVENOUS
  Administered 2013-07-02: 07:00:00 via INTRAVENOUS

## 2013-07-01 MED ORDER — RESOURCE THICKENUP CLEAR PO POWD
ORAL | Status: DC | PRN
  Administered 2013-07-06: 03:00:00 via ORAL
  Filled 2013-07-01 (×4): qty 125

## 2013-07-01 NOTE — Progress Notes (Signed)
Rehab Admissions Coordinator Note:  Patient was screened by Cleatrice Burke for appropriateness for an Inpatient Acute Rehab Consult.  At this time, we are recommending Inpatient Rehab consult.  Cleatrice Burke 07/01/2013, 10:32 PM  I can be reached at [phone].

## 2013-07-01 NOTE — Progress Notes (Signed)
2253 found pts PICC line pulled partially out. Paused fluids and paged Dr. Joya Gaskins at [facility]. CXR ordered.  [place] Dr. Jimmy Footman to confirm CXR results. Orders given to restart D5W at 75 and confirmed PICC line could be used for IV pushes as needed. Will re-address the issue on day shift.

## 2013-07-01 NOTE — Evaluation (Signed)
Physical Therapy Evaluation Patient Details Name: Blake Bell MRN: [id number] DOB: 10/15/1950 Today's Date: 07/01/2013 Time: 1856-3149 PT Time Calculation (min): 36 min  PT Assessment / Plan / Recommendation History of Present Illness   63 yo male with altered mental status and seizures. Intubated for airway protection. BP in ER 214/148. PCCM asked to admit to ICU. Self extubated 06/27/13. Requires 6L O2 to maintain oxygenation. Very lethargic since extubation and difficult to arouse/assess.   Clinical Impression  Pt adm due to the above. Presents with limitations in functional mobility secondary to deficits indicated below. Pt to benefit from skilled acute PT to address deficits listed below (see PT problem list) and improve independence. Pt does have great family support at bedside. Will have wife available 24/7 when he discharges home. Hqwever, at this time pt requires increased (A) for mobility and will require post acute rehab. Pt to be a great candidate for CIR due to independent prior level of function and due to increased family support.      PT Assessment  Patient needs continued PT services    Follow Up Recommendations  CIR    Does the patient have the potential to tolerate intense rehabilitation      Barriers to Discharge Decreased caregiver support wife unable to provide increased amount of physical (A) safely    Equipment Recommendations  Other (comment) (TBD)    Recommendations for Other Services OT consult   Frequency Min 4X/week    Precautions / Restrictions Precautions Precautions: Fall Restrictions Weight Bearing Restrictions: No   Pertinent Vitals/Pain BP WNL; O2 desat to 82% with increased activity       Mobility  Bed Mobility Overal bed mobility: +2 for physical assistance;Needs Assistance Bed Mobility: Supine to Sit;Sit to Supine;Rolling Rolling: Max assist Supine to sit: +2 for physical assistance;Total assist;HOB elevated Sit to supine: +2 for  physical assistance;Total assist General bed mobility comments: pt able to minimally advance LEs towards EOB with max cues and tactile facilitation; pt required 2 (A) to control LEs off EOB and bring shoulders to sitting position ; when returned to supine pt requires (A) to advance LEs onto bed and guide shoulders into supine position; pt was able to roll with max (A) of one person; max multimodal cues throughout bed mobility  Transfers Overall transfer level: Needs assistance Equipment used: Ambulation equipment used;Rolling walker (2 wheeled) Transfers: Sit to/from Stand Sit to Stand: From elevated surface;+2 physical assistance;Total assist General transfer comment: attempted sit to stand x2; pt unable to achieve with 2 person (A) at this time; will re-attempt with use of draw pad under hips to elevate and/or lift equipment next session; pt fatigued with attempts          PT Diagnosis: Difficulty walking;Generalized weakness  PT Problem List: Decreased strength;Decreased range of motion;Decreased activity tolerance;Decreased balance;Decreased mobility;Decreased knowledge of use of DME;Decreased safety awareness;Decreased knowledge of precautions;Cardiopulmonary status limiting activity;Pain;Obesity;Decreased cognition PT Treatment Interventions: Gait training;DME instruction;Functional mobility training;Therapeutic activities;Balance training;Therapeutic exercise;Neuromuscular re-education;Patient/family education     PT Goals(Current goals can be found in the care plan section) Acute Rehab PT Goals Patient Stated Goal: "i want some applesauce" PT Goal Formulation: With patient/family Time For Goal Achievement: 07/15/13 Potential to Achieve Goals: Good  Visit Information  Last PT Received On: 07/01/13 Assistance Needed: +2 History of Present Illness:  64 yo male with altered mental status and seizures. Intubated for airway protection. BP in ER 214/148. PCCM asked to admit to ICU. Self  extubated 06/27/13. Requires 6L O2  to maintain oxygenation. Very lethargic since extubation and difficult to arouse/assess.        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    Cognition  Cognition Arousal/Alertness: Awake/alert Behavior During Therapy: Flat affect Overall Cognitive Status: Impaired/Different from baseline Area of Impairment: Orientation;Following commands;Memory;Problem solving;Safety/judgement Orientation Level: Disoriented to;Time;Situation Memory: Decreased short-term memory Following Commands: Follows one step commands with increased time;Follows one step commands consistently Safety/Judgement: Decreased awareness of deficits;Decreased awareness of safety Problem Solving: Slow processing;Decreased initiation;Difficulty sequencing;Requires verbal cues;Requires tactile cues General Comments: Pt was able to orient to place; was unaware of time or situation; pt perseverating on applesauce and then on using the bathroom; pt becomes agitated and attempted to pull of leads and get OOB; son present to help calm pt     Extremity/Trunk Assessment Upper Extremity Assessment Upper Extremity Assessment: Defer to OT evaluation Lower Extremity Assessment Lower Extremity Assessment: Generalized weakness Cervical / Trunk Assessment Cervical / Trunk Assessment: Kyphotic   Balance Balance Overall balance assessment: Needs assistance Sitting-balance support: Feet  unsupported;Feet supported;Single extremity supported Sitting balance-Leahy Scale: Fair Sitting balance - Comments: pt tolerated sitting EOB ~10 min   End of Session PT - End of Session Equipment Utilized During Treatment: Gait belt;Oxygen;Other (comment) (5L O2 ) Activity Tolerance: Patient tolerated treatment well Patient left: in bed;with call bell/phone within reach;with family/visitor present;with nursing/sitter in room Nurse Communication: Mobility status;Need for lift equipment  GP     Gustavus Bryant, [place] 07/01/2013, 3:23 PM

## 2013-07-01 NOTE — Progress Notes (Signed)
Speech Language Pathology Treatment: Dysphagia  Patient Details Name: Blake Bell MRN: [id number] DOB: June 08, 1951 Today's Date: 07/01/2013 Time: 5498-2641 SLP Time Calculation (min): 13 min  Assessment / Plan / Recommendation Clinical Impression  Pt demonstrating significant improvement in arousal, RR is still rapid but stable with PO intake. Pt demonstrates tolerance of puree with timely swallow, no holding or cues needed to initiate. He does have continue hoarse vocal quality and immediate evidence of aspiration with minimal thin liquids. SLP will need objective test prior to intitiating liquids. Will attempt FEES if possible this pm. Pt may initiate puree/pudding thick liquids with full supervision and meds crushed in puree as well, as long as he is alert and RR is stable.    HPI HPI: 63 yo male with altered mental status and seizures. Intubated for airway protection. BP in ER 214/148. PCCM asked to admit to ICU. Self extubated 06/27/13. Requires 6L O2 to maintain oxygenation. Very lethargic since extubation and difficult to arouse/assess. Follows some commands for nursing. On and off BiPAP every 4 hours. Pt needs to take oral meds per MD.   Pertinent Vitals NA  SLP Plan   (FEES)    Recommendations Diet recommendations: Dysphagia 1 (puree);Pudding-thick liquid Liquids provided via: Teaspoon Medication Administration: Crushed with puree Supervision: Staff to assist with self feeding Postural Changes and/or Swallow Maneuvers: Seated upright 90 degrees;Upright 30-60 min after meal              Oral Care Recommendations: Oral care BID Follow up Recommendations: Skilled Nursing facility Plan:  (FEES)    GO     Makensey Rego, Katherene Ponto 07/01/2013, 9:39 AM

## 2013-07-01 NOTE — Progress Notes (Signed)
Name: Blake Bell MRN: [id number] DOB: [birth year]    ADMISSION DATE:  06/21/2013  REFERRING MD :  EDP PRIMARY SERVICE: CCM  CHIEF COMPLAINT:  AMS  BRIEF PATIENT DESCRIPTION: 63 yo male admitted with altered mental status and seizures. Intubated for airway protection. BP in ER 214/148. PCCM asked to admit to ICU. Self extubated 06/27/13. Requires 6L O2 to maintain oxygenation. Follows some commands but still lethargic, confused at times. Had some bigeminy overnight on [place], but resolved with BiPAP.  SIGNIFICANT EVENTS:  1/5 Admit, neurology consulted  1/6 Off insulin gtt  1/7 Possible aspiration of tube feeds  1/11 Self extubated, put on BiPAP  1/12 remains on nicardipine  1/13 WBC trending up, BIPAP improving 1/14- self dc'ed panda, 4hr on/4hrs off BiPAP 1/15- Diet initiated, SLP to do FEES, PICC replaced  STUDIES:  1/5 CT head >> mild atrophy with patchy small vessel disease, ?aneurysm Rt superior to sella  1/5 CT angio head >> no aneurysm, 13 mm mass Rt sella/suprasellar region  1/6 EEG >> normal sleep EEG  1/8 Echo >> mild LVH, EF 55 to 60%, mod LA dilation  1/12 CXR >> Slight improvement in congestive heart failure or interstitial edema  1/14 CXR 1/15 FEES >>  LINES / TUBES:  1/5 ETT>>> 1/11  1/7 Rt PICC>>>  exchanged over guidewire 1/15  CULTURES:  1/5 BC>>> Neg  1/5 MRSA >> positive   ANTIBIOTICS:  Acyclovir 1/5>>>1/8   SUBJECTIVE: Asleep in bed, easily aroused, quick to nod off mid conversation Nods head yes/no, also verbalizes some appropriate responses.  Follows commands in-between nodding off to sleep.  VITAL SIGNS: Temp:  [97.2 F (36.2 C)-98.4 F (36.9 C)] 97.4 F (36.3 C) (01/15 0744) Pulse Rate:  [70-94] 82 (01/15 0945) Resp:  [13-32] 23 (01/15 0945) BP: (120-207)/(60-118) 132/88 mmHg (01/15 0945) SpO2:  [95 %-100 %] 97 % (01/15 0945) FiO2 (%):  [40 %-60 %] 40 % (01/15 0744) Weight:  [175 kg (385 lb 12.9 oz)] 175 kg (385 lb 12.9 oz) (01/15  0435) HEMODYNAMICS:   VENTILATOR SETTINGS: Vent Mode:  [-] BIPAP FiO2 (%):  [40 %-60 %] 40 % Set Rate:  [12 bmp] 12 bmp PEEP:  [6 cmH20] 6 cmH20 INTAKE / OUTPUT: Intake/Output     01/14 0701 - 01/15 0700 01/15 0701 - 01/16 0700   I.V. (mL/kg) 2388.8 (13.7) 105 (0.6)   NG/GT     IV Piggyback 200    Total Intake(mL/kg) 2588.8 (14.8) 105 (0.6)   Urine (mL/kg/hr) 1480 (0.4) 210 (0.3)   Total Output 1480 210   Net +1108.8 -105          PHYSICAL EXAMINATION: General: Very somnolent/lethargic, follows commands, responds appropriately Neuro: oriented to name only, unable to stay awake long enough for remaining questions without further stimulation HEENT: PERRL, no icterus or pallor Cardiovascular: distant heart sounds due to body habitus, RRR, S1, S2 present, No murmur, rubs, gallops  Lungs: distant breath sounds, no wheezes, diminished bases Abdomen: obese, soft, non tender, non distended + hypoactive BS Musculoskeletal: trace non pitting edema B/L LE Skin: chronic venous stasis changes; bilateral LE wrapped in gauze  LABS:  CBC  Recent Labs Lab 06/29/13 0330 06/30/13 0500 07/01/13 0218  WBC 12.2* 9.2 9.8  HGB 13.6 11.6* 13.3  HCT 43.2 37.4* 42.7  PLT 365 326 288   Coag's No results found for this basename: APTT, INR,  in the last 168 hours BMET  Recent Labs Lab 06/30/13 0500 06/30/13 2056 07/01/13  0218  NA 158* 156* 156*  K 2.9* 3.7 3.9  CL 121* 119* 120*  CO2 '24 27 22  ' BUN 27* 28* 26*  CREATININE 1.56* 1.63* 1.61*  GLUCOSE 169* 191* 134*   Electrolytes  Recent Labs Lab 06/29/13 0330 06/30/13 0500 06/30/13 2056 07/01/13 0218  CALCIUM 9.4 8.3* 9.4 9.3  MG 1.9 1.8  --   --   PHOS 3.1 3.5  --   --    Sepsis Markers No results found for this basename: LATICACIDVEN, PROCALCITON, O2SATVEN,  in the last 168 hours ABG  Recent Labs Lab 06/28/13 2000 06/29/13 1147 07/01/13 0428  PHART 7.352 7.416 7.330*  PCO2ART 48.9* 48.8* 52.8*  PO2ART 192.0*  76.0* 102.0*   Liver Enzymes No results found for this basename: AST, ALT, ALKPHOS, BILITOT, ALBUMIN,  in the last 168 hours Cardiac Enzymes  Recent Labs Lab 06/29/13 0330  PROBNP 1004.0*   Glucose  Recent Labs Lab 06/30/13 1232 06/30/13 1624 06/30/13 2021 07/01/13 0015 07/01/13 0419 07/01/13 0737  GLUCAP 120* 165* 188* 128* 140* 134*    Imaging CXR: 1/12 >> Slight improvement in congestive heart failure or interstitial edema  CXR 1/14>> Some pulmonary edema noted. PICC in R subclavian.  ASSESSMENT / PLAN:   PULMONARY  A:  Acute respiratory failure in setting of encephalopathy and seizures.  Hx of OSA, uses CPAP at home >> followed by Dr. Gwenette Greet as outpt.  Bigeminy on [place] P:  Trial off BiPAP most of day today. Continue O2 support, goal O2>92% Needs nocturnal BIPAP with increased support to blow off CO2 Maintaining overall fluid balance.  CARDIOVASCULAR  A:  Malignant HTN, currently controlled off nicardipine P:  Goal SBP < 170 , goals met thus far, will continue to add on antihypertensive therapy  Continue hydralazine, labetalol IV Continue clonidine 0.3 patch Trial POs today: will restart PO coreg and norvasc  Continue to hold enalapril, losartan, aldactone, lasix Cardene drip OFF now >24hrs  RENAL  A:  Acute kidney injury >> improving.  Stage 3 CKD.  Stable K today Continued Hypernatremia.  P:  Trend BMET, Check serum NA later today.  Continue foley for now   GASTROINTESTINAL  A:  Nutrition.  Morbid obesity.   P:  Per SLP begin dys 1 diet, FEES today Protonix IV for SUP prophylaxis   HEMATOLOGIC  A:  No acute issues. P:  SQ Heparin for dvt prophylaxis- continue   INFECTIOUS  A:  Chronic stasis ulcers of lower extremities  No leukocytosis.  Afebrile  P:  Per wound care team.  ENDOCRINE  A:  DM type II with hyperglycemia.  P:  SSI - resistant  Continue to hold lantus despite diet, likely minimal PO intake, will monitor  CBGs Continue to hold outpt glyburide, metformin due to current renal dysfunction.   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  TSH and GH WNL, LH and FSH low, ACTH mild elev, Prolactin severely elevated (>1000 ng/mL) P:  Will continue to control BP and add meds as pt able to swallow Will need workup for suprasellar mass (e.g. Hyperprolactinemia) Correct Na further  TODAY'S SUMMARY: Correct na, off NIMV today, attempt POs,BP tolerating scheduled IV meds management.  Consider tx to SDU 1/16.   Demetrios Loll PA-S   Attending:  I have seen and examined the patient with nurse practitioner/resident and agree with the note above.  Jillyn Hidden PCCM Pager: [phone] Cell: [phone] If no response, call [phone]  07/01/2013, 10:54 AM

## 2013-07-02 DIAGNOSIS — J9600 Acute respiratory failure, unspecified whether with hypoxia or hypercapnia: Secondary | ICD-10-CM

## 2013-07-02 DIAGNOSIS — R569 Unspecified convulsions: Secondary | ICD-10-CM

## 2013-07-02 LAB — GLUCOSE, CAPILLARY
GLUCOSE-CAPILLARY: 137 mg/dL — AB (ref 70–99)
GLUCOSE-CAPILLARY: 166 mg/dL — AB (ref 70–99)
GLUCOSE-CAPILLARY: 187 mg/dL — AB (ref 70–99)
GLUCOSE-CAPILLARY: 202 mg/dL — AB (ref 70–99)
Glucose-Capillary: 149 mg/dL — ABNORMAL HIGH (ref 70–99)
Glucose-Capillary: 173 mg/dL — ABNORMAL HIGH (ref 70–99)
Glucose-Capillary: 169 mg/dL — ABNORMAL HIGH (ref 70–99)
Glucose-Capillary: 166 mg/dL — ABNORMAL HIGH (ref 70–99)

## 2013-07-02 LAB — BASIC METABOLIC PANEL
BUN: 29 mg/dL — AB (ref 6–23)
BUN: 30 mg/dL — AB (ref 6–23)
CHLORIDE: 107 meq/L (ref 96–112)
CO2: 25 mEq/L (ref 19–32)
CO2: 27 mEq/L (ref 19–32)
Calcium: 8.3 mg/dL — ABNORMAL LOW (ref 8.4–10.5)
Calcium: 8.7 mg/dL (ref 8.4–10.5)
Chloride: 115 mEq/L — ABNORMAL HIGH (ref 96–112)
Creatinine, Ser: 2.19 mg/dL — ABNORMAL HIGH (ref 0.50–1.35)
Creatinine, Ser: 2.35 mg/dL — ABNORMAL HIGH (ref 0.50–1.35)
GFR calc Af Amer: 35 mL/min — ABNORMAL LOW (ref >90)
GFR, EST AFRICAN AMERICAN: 32 mL/min — AB (ref >90)
GFR, EST NON AFRICAN AMERICAN: 30 mL/min — AB (ref >90)
GFR, EST NON AFRICAN AMERICAN: 28 mL/min — AB (ref >90)
Glucose, Bld: 531 mg/dL — ABNORMAL HIGH (ref 70–99)
Glucose, Bld: 189 mg/dL — ABNORMAL HIGH (ref 70–99)
POTASSIUM: 3.1 meq/L — AB (ref 3.7–5.3)
Potassium: 3.6 mEq/L — ABNORMAL LOW (ref 3.7–5.3)
SODIUM: 144 meq/L (ref 137–147)
Sodium: 154 mEq/L — ABNORMAL HIGH (ref 137–147)

## 2013-07-02 MED ORDER — INSULIN GLARGINE 100 UNIT/ML ~~LOC~~ SOLN
15.0000 [IU] | Freq: Every day | SUBCUTANEOUS | Status: DC
  Administered 2013-07-02 – 2013-07-05 (×4): 15 [IU] via SUBCUTANEOUS
  Filled 2013-07-02 (×4): qty 0.15

## 2013-07-02 MED ORDER — HYDRALAZINE HCL 25 MG PO TABS
25.0000 mg | ORAL_TABLET | Freq: Three times a day (TID) | ORAL | Status: DC
  Administered 2013-07-02 – 2013-07-04 (×6): 25 mg via ORAL
  Filled 2013-07-02 (×9): qty 1

## 2013-07-02 MED ORDER — POTASSIUM CHLORIDE CRYS ER 20 MEQ PO TBCR: 40.0000 meq | EXTENDED_RELEASE_TABLET | Freq: Once | ORAL | Status: DC

## 2013-07-02 MED ORDER — POTASSIUM CHLORIDE 10 MEQ/50ML IV SOLN
10.0000 meq | INTRAVENOUS | Status: AC
  Administered 2013-07-02 (×4): 10 meq via INTRAVENOUS
  Filled 2013-07-02 (×4): qty 50

## 2013-07-02 MED ORDER — PANTOPRAZOLE SODIUM 40 MG PO TBEC
40.0000 mg | DELAYED_RELEASE_TABLET | Freq: Every day | ORAL | Status: DC
  Administered 2013-07-03 – 2013-07-13 (×10): 40 mg via ORAL
  Filled 2013-07-02 (×6): qty 1

## 2013-07-02 NOTE — Progress Notes (Signed)
Name: Blake Bell MRN: [id number] DOB: March 05, 1951    ADMISSION DATE:  06/21/2013  REFERRING MD :  EDP PRIMARY SERVICE: CCM  CHIEF COMPLAINT:  AMS  BRIEF PATIENT DESCRIPTION: 63 yo male admitted with altered mental status and seizures. Intubated for airway protection. BP in ER 214/148. PCCM asked to admit to ICU. Self extubated 06/27/13. [place] days/BiPAP nights. Hx of HTN, CKD, and DM.  SIGNIFICANT EVENTS:  1/5 Admit, neurology consulted  1/6 Off insulin gtt  1/7 Possible aspiration of tube feeds  1/11 Self extubated, put on BiPAP  1/12 remains on nicardipine  1/13 WBC trending up, BIPAP improving 1/14- self dc'ed panda, 4hr on/4hrs off BiPAP 1/15- Diet initiated, PICC replaced  STUDIES:  1/5 CT head >> mild atrophy with patchy small vessel disease, ?aneurysm Rt superior to sella  1/5 CT angio head >> no aneurysm, 13 mm mass Rt sella/suprasellar region  1/6 EEG >> normal sleep EEG  1/8 Echo >> mild LVH, EF 55 to 60%, mod LA dilation  1/12 CXR >> Slight improvement in congestive heart failure or interstitial edema  1/14 CXR>> Stable cardiomegaly, minimal interstitial pulmonary edema.  CXR 1/15 PM >> Continued Pulm edema.  LINES / TUBES:  1/5 ETT>>> 1/11  1/7 Rt PICC>>>  exchanged over guidewire 1/15  CULTURES:  1/5 BC>>> Neg  1/5 MRSA >> positive   ANTIBIOTICS:  Acyclovir 1/5>>>1/8   SUBJECTIVE: Asleep in bed, easily aroused, still fairly lethargic but able to carry on an appropriate conversation today.  VITAL SIGNS: Temp:  [97.3 F (36.3 C)-98.1 F (36.7 C)] 97.3 F (36.3 C) (01/16 0843) Pulse Rate:  [65-91] 66 (01/16 0855) Resp:  [11-39] 17 (01/16 0855) BP: (107-205)/(58-114) 155/81 mmHg (01/16 1002) SpO2:  [94 %-100 %] 98 % (01/16 0855) Weight:  [175 kg (385 lb 12.9 oz)] 175 kg (385 lb 12.9 oz) (01/16 0453) HEMODYNAMICS:   VENTILATOR SETTINGS:   INTAKE / OUTPUT: Intake/Output     01/15 0701 - 01/16 0700 01/16 0701 - 01/17 0700   I.V. (mL/kg) 2090  (11.9)    IV Piggyback     Total Intake(mL/kg) 2090 (11.9)    Urine (mL/kg/hr) 1265 (0.3)    Total Output 1265     Net +825            PHYSICAL EXAMINATION: General: lethargic, follows commands, responds appropriately Neuro: oriented to name only HEENT: PERRL, no icterus or pallor Cardiovascular: distant heart sounds due to body habitus, RRR, S1, S2 present, No murmur, rubs, gallops  Lungs: distant breath sounds, no wheezes, diminished bases Abdomen: obese, soft, non tender, non distended + hypoactive BS Musculoskeletal: trace non pitting edema B/L LE Skin: chronic venous stasis changes; bilateral LE wrapped in gauze  LABS:  CBC  Recent Labs Lab 06/29/13 0330 06/30/13 0500 07/01/13 0218  WBC 12.2* 9.2 9.8  HGB 13.6 11.6* 13.3  HCT 43.2 37.4* 42.7  PLT 365 326 288   Coag's No results found for this basename: APTT, INR,  in the last 168 hours BMET  Recent Labs Lab 06/30/13 2056 07/01/13 0218 07/01/13 1400 07/02/13 0500  NA 156* 156* 158* 144  K 3.7 3.9  --  3.1*  CL 119* 120*  --  107  CO2 27 22  --  25  BUN 28* 26*  --  29*  CREATININE 1.63* 1.61*  --  2.19*  GLUCOSE 191* 134*  --  531*   Electrolytes  Recent Labs Lab 06/29/13 0330 06/30/13 0500 06/30/13 2056 07/01/13 7782  07/02/13 0500  CALCIUM 9.4 8.3* 9.4 9.3 8.3*  MG 1.9 1.8  --   --   --   PHOS 3.1 3.5  --   --   --    Sepsis Markers No results found for this basename: LATICACIDVEN, PROCALCITON, O2SATVEN,  in the last 168 hours ABG  Recent Labs Lab 06/28/13 2000 06/29/13 1147 07/01/13 0428  PHART 7.352 7.416 7.330*  PCO2ART 48.9* 48.8* 52.8*  PO2ART 192.0* 76.0* 102.0*   Liver Enzymes No results found for this basename: AST, ALT, ALKPHOS, BILITOT, ALBUMIN,  in the last 168 hours Cardiac Enzymes  Recent Labs Lab 06/29/13 0330  PROBNP 1004.0*   Glucose  Recent Labs Lab 07/01/13 0737 07/01/13 1313 07/01/13 1941 07/01/13 2332 07/02/13 0427 07/02/13 0749  GLUCAP 134* 149*  101* 166* 187* 173*    Imaging CXR 1/15 PM >> Continued Pulm edema.  ASSESSMENT / PLAN:   PULMONARY  A:  Acute respiratory failure in setting of encephalopathy and seizures> resolved Hx of OSA, (OHS) uses CPAP at home >> followed by Dr. Gwenette Greet as outpt.  P:  Needs nocturnal BIPAP with increased support to blow off CO2  CARDIOVASCULAR  A:  Malignant HTN, currently controlled off nicardipine P:  Goal SBP < 170 , goals met thus far, will continue to add on antihypertensive therapy  Change hydralazine to po Continue clonidine 0.3 patch PO coreg and norvasc  Continue to hold enalapril, losartan, and aldactone.  RENAL  A:  Acute kidney injury. Stage 3 CKD.  Hypokalemia. Continued Hypernatremia with correction for glucose. (this morning's sodium scewed by hyperglycemia)  P:  Continue D5W, follow up BMET now Trend bmet Replete K.  GASTROINTESTINAL  A:  Nutrition.  Morbid obesity.   P:  Dys 1 diet. Ensure carb modified. Protonix IV for SUP prophylaxis.  HEMATOLOGIC  A:  No acute issues. P:  SQ Heparin for dvt prophylaxis- continue.  INFECTIOUS  A:  Chronic stasis ulcers of lower extremities  No leukocytosis.  Afebrile  P:  Per wound care team.  ENDOCRINE  A:  DM type II with hyperglycemia.  P:  SSI - resistant  Start lantus 1/16 Continue to hold outpt glyburide, metformin due to current renal dysfunction.   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  TSH and GH WNL, LH and FSH low, ACTH mild elev, Prolactin severely elevated (>1000 ng/mL) P:  Will continue to control BP and add meds as pt able to swallow Will need workup for suprasellar mass (e.g. Hyperprolactinemia) Correct Na further.  TODAY'S SUMMARY: Correct Na, encourage POs, BP responding to dual PO/IV therapy. Tx to SDU.   Melissa A Holmes  PA-S 07/02/2013 1000   Attending:  I have seen and examined the patient with nurse practitioner/resident and agree with the note above.   Jillyn Hidden PCCM Pager: [phone] Cell: [phone] If no response, call [phone]

## 2013-07-02 NOTE — Progress Notes (Signed)
  PT Cancellation Note  Patient Details Name: Blake Bell MRN: [id number] DOB: 1950-07-18   Cancelled Treatment:    Reason Eval/Treat Not Completed: Patient at procedure or test/unavailable. Pt in FEES and will then be transferred to 3S. Will re-attempt to see at next available time.    Elie Confer [place], [place] 07/02/2013, 1:57 PM

## 2013-07-02 NOTE — Progress Notes (Signed)
eLink Physician-Brief Progress Note Patient Name: Blake Bell DOB: 28-Apr-1951 MRN: [id number]  Date of Service  07/02/2013   HPI/Events of Note   Hypokalemia  eICU Interventions  Potassium replaced   Intervention Category Minor Interventions: Electrolytes abnormality - evaluation and management  Yader Criger 07/02/2013, 6:20 AM

## 2013-07-02 NOTE — Procedures (Signed)
Objective Swallowing Evaluation:    Patient Details  Name: Blake Bell MRN: [id number] Date of Birth: 01-07-1951  Today's Date: 07/02/2013 Time: 1330-1410 SLP Time Calculation (min): 40 min  Past Medical History:  Past Medical History  Diagnosis Date  . Hypertension   . Diabetes mellitus without complication   . Sleep apnea     uses cpap, setting of 5  . Chronic kidney disease     ssees dr Florene Glen nephrology every 6-9 months  . Multiple wounds     on lower legs - sees [name] Pain at the [facility]   Past Surgical History:  Past Surgical History  Procedure Laterality Date  . Colonscopy  6-7 yrs ago  . Colonoscopy N/A 04/05/2013    Procedure: COLONOSCOPY;  Surgeon: Juanita Craver, MD;  Location: [facility];  Service: Endoscopy;  Laterality: N/A;   HPI:  63 yo male with altered mental status and seizures. Intubated for airway protection. BP in ER 214/148. PCCM asked to admit to ICU. Self extubated 06/27/13. Requires 6L O2 to maintain oxygenation. Very lethargic since extubation and difficult to arouse/assess. Follows some commands for nursing. On and off BiPAP every 4 hours. Pt needs to take oral meds per MD.     Assessment / Plan / Recommendation Clinical Impression  Dysphagia Diagnosis: Moderate pharyngeal phase dysphagia Clinical impression: Pt. exhibited mild oral dysphagia with mild weakess and exacerbated primarily by cognitive deficits.  Moderate sensorimotor (motor>sensory) pharyngeal dysphagia marked by delayed swallow initiation to valleculae and pyriform sinuses with mod-max pharyngeal residue.  Only able to initiate second swallow with max verbaltactile/visual cues/strategies (volitional throat clea/dry spoon) less than half the time and with delay up to 3 minutes. Laryngeal penetration and probable aspiration observed after the swallow via residue from valleculae and interarytenoid space.  His cognitve deficits significantly impact ability to safely consume any po's and  NPO is recommended.  Uncertain of timeline of swallow prognosis.  He may benefit from PEG for a limited time to provide nutrition with less aspiration risk (although always risk with PEG's) until he is safely able to consume food/liquids.  SLP will attempt therapy with pharyngeal strengthening exercises, however, unlilkely he will be able to accurately perform.        Treatment Recommendation  Therapy as outlined in treatment plan below    Diet Recommendation NPO        Other  Recommendations Oral Care Recommendations: Oral care Q4 per protocol   Follow Up Recommendations  Skilled Nursing facility    Frequency and Duration min 2x/week  2 weeks   Pertinent Vitals/Pain WDL            Reason for Referral Objectively evaluate swallowing function   Oral Phase Oral Preparation/Oral Phase Oral Phase: Impaired Oral - Honey Oral - Honey Teaspoon: Delayed oral transit;Weak lingual manipulation Oral - Solids Oral - Puree: Nasal reflux   Pharyngeal Phase Pharyngeal Phase Pharyngeal Phase: Impaired Pharyngeal - Honey Pharyngeal - Honey Teaspoon: Delayed swallow initiation;Premature spillage to pyriform sinuses;Premature spillage to valleculae;Penetration/Aspiration before swallow;Penetration/Aspiration after swallow;Pharyngeal residue - valleculae;Pharyngeal residue - pyriform sinuses;Reduced laryngeal elevation;Reduced tongue base retraction;Inter-arytenoid space residue Penetration/Aspiration details (honey teaspoon): Material enters airway, remains ABOVE vocal cords then ejected out Pharyngeal - Solids Pharyngeal - Puree: Delayed swallow initiation;Premature spillage to valleculae;Premature spillage to pyriform sinuses;Pharyngeal residue - valleculae;Reduced tongue base retraction;Penetration/Aspiration after swallow;Reduced laryngeal elevation;Pharyngeal residue - pyriform sinuses;Inter-arytenoid space residue Penetration/Aspiration details (puree): Material enters airway, passes BELOW  cords then ejected out (ejected with cough)  Cervical  Esophageal Phase    GO    Cervical Esophageal Phase Cervical Esophageal Phase: [facility]         Cranford Mon[employer] [phone]  07/02/2013

## 2013-07-02 NOTE — Progress Notes (Addendum)
Pt rotated himself in bed to prone position, inadvertently pulled PICC line out entirely (catheter = 45cm) order obtained to replace PICC, IV Team notified.

## 2013-07-02 NOTE — Consult Note (Addendum)
Physical Medicine and Rehabilitation Consult Reason for Consult: Acute respiratory failure/seizure/encephalopathy Referring Physician: Critical care   HPI: Cluster Acre is a 63 y.o. right-handed male with history of hypertension, diabetes mellitus with peripheral neuropathy, chronic renal insufficiency with baseline creatinine 1.56 and chronic multiple lower extremity wounds. Admitted 06/22/2013 with reported seizures. Patient was intubated and sedated. 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 Wound care nurse consulted for chronic full-thickness stasis ulcers to the lower extremities and wound care is advised. Blood pressure upon admission of 214/148 maintained on multiple antihypertensive medications. Echocardiogram with ejection fraction of 60%. EEG was negative. Patient self extubated 06/27/2013 and monitored requiring at times of 6 L of oxygen to maintain oxygenation. Subcutaneous heparin for DVT prophylaxis. Maintained on a dysphagia 1 pudding thick liquid diet. MRSA nasal swab positive remained on contact precautions. Renal function monitored closely with mod elevation creatinine 2.19 from baseline 1.56. Physical therapy evaluation completed 07/01/2013 with noted lethargy and difficult to arouse . Recommendations made for physical medicine rehabilitation consult to consider inpatient rehabilitation services  Review of Systems  Unable to perform ROS: mental acuity   Past Medical History  Diagnosis Date  . Hypertension   . Diabetes mellitus without complication   . Sleep apnea     uses cpap, setting of 5  . Chronic kidney disease     ssees dr Lowell Guitar nephrology every 6-9 months  . Multiple wounds     on lower legs - [facility]   Past Surgical History   Procedure Laterality Date  . Colonscopy  6-7 yrs ago  . Colonoscopy N/A 04/05/2013    Procedure: COLONOSCOPY;  Surgeon: Charna Elizabeth, MD;  Location: [facility];  Service: Endoscopy;  Laterality: N/A;   Family History  Problem Relation Age of Onset  . Diabetes Mother   . Diabetes Father   . Diabetes Sister   . Diabetes Brother   . Hypertension Mother   . Hypertension Father    Social History:  reports that he quit smoking about 43 years ago. His smoking use included Cigarettes. He has a .5 pack-year smoking history. He has never used smokeless tobacco. He reports that he does not drink alcohol or use illicit drugs. Allergies: No Known Allergies Medications Prior to Admission  Medication Sig Dispense Refill  . amLODipine (NORVASC) 10 MG tablet Take 10 mg by mouth daily.      . carvedilol (COREG) 6.25 MG tablet Take 6.25 mg by mouth 2 (two) times daily with a meal.      . cloNIDine (CATAPRES) 0.2 MG tablet Take 0.2 mg by mouth daily.      . enalapril (VASOTEC) 20 MG tablet Take 40 mg by mouth every evening.       . furosemide (LASIX) 80 MG tablet Take 80 mg by mouth daily.       Marland Kitchen glyBURIDE-metformin (GLUCOVANCE) 2.5-500 MG per tablet Take 2 tablets by mouth 2 (two) times daily with a meal.       . losartan (COZAAR) 100 MG tablet Take 100 mg by mouth daily.       . penicillin v potassium (VEETID) 500 MG tablet Take 500 mg by mouth 2 (two) times daily. continuous      . potassium chloride SA (K-DUR,KLOR-CON) 20 MEQ tablet Take 40  mEq by mouth 2 (two) times daily.       Marland Kitchen spironolactone (ALDACTONE) 50 MG tablet Take 50 mg by mouth daily.        Home: Home Living Family/patient expects to be discharged to:: Private residence Living Arrangements: Spouse/significant other Available Help at Discharge: Family;Available 24 hours/day Type of Home: House Home Access: Stairs to enter [employer] of Steps: 1 Entrance Stairs-Rails: None Home Layout: One level Home Equipment:  Cane - single point  Functional History: Prior Function Comments: per son; pt ambulates with SPC as needed ; PTA pt was driving and working part time  Functional Status:  Mobility:          ADL:    Cognition: Cognition Overall Cognitive Status: Impaired/Different from baseline Orientation Level: Oriented to person Cognition Arousal/Alertness: Awake/alert Behavior During Therapy: Flat affect Overall Cognitive Status: Impaired/Different from baseline Area of Impairment: Orientation;Following commands;Memory;Problem solving;Safety/judgement Orientation Level: Disoriented to;Time;Situation Memory: Decreased short-term memory Following Commands: Follows one step commands with increased time;Follows one step commands consistently Safety/Judgement: Decreased awareness of deficits;Decreased awareness of safety Problem Solving: Slow processing;Decreased initiation;Difficulty sequencing;Requires verbal cues;Requires tactile cues General Comments: Pt was able to orient to place; was unaware of time or situation; pt perseverating on applesauce and then on using the bathroom; pt becomes agitated and attempted to pull of leads and get OOB; son present to help calm pt   Blood pressure 155/81, pulse 68, temperature 97.3 F (36.3 C), temperature source Axillary, resp. rate 18, height 6' (1.829 m), weight 175 kg (385 lb 12.9 oz), SpO2 96.00%. Physical Exam  Constitutional:  63 year old African American morbidly obese lethargic male  HENT:  Head: Normocephalic.  Eyes:  Pupils reactive to light  Neck: Normal range of motion. Neck supple. No thyromegaly present.  Cardiovascular: Normal rate and regular rhythm.   Respiratory:  Decreased breath sounds at the bases  GI: Soft. Bowel sounds are normal. He exhibits no distension.  Neurological:  Lethargic. Unable to keep awake. Can open eyes when cued verbally or with tactile cues. Senses pain. Moves all 4's. Pupils constric to light.. Nods yes or  mumbles yes to most questions. Frequent myoclonic jerks in arms and legs bilaterally.  Skin:  Chronic ischemic changes lower extremities  Psychiatric:  Confused, lethargic    Results for orders placed during the hospital encounter of 06/21/13 (from the past 24 hour(s))  GLUCOSE, CAPILLARY     Status: Abnormal   Collection Time    07/01/13  1:13 PM      Result Value Range   Glucose-Capillary 149 (*) 70 - 99 mg/dL  SODIUM     Status: Abnormal   Collection Time    07/01/13  2:00 PM      Result Value Range   Sodium 158 (*) 137 - 147 mEq/L  GLUCOSE, CAPILLARY     Status: Abnormal   Collection Time    07/01/13  4:17 PM      Result Value Range   Glucose-Capillary 202 (*) 70 - 99 mg/dL   Comment 1 Documented in Chart     Comment 2 Notify RN    GLUCOSE, CAPILLARY     Status: Abnormal   Collection Time    07/01/13  7:41 PM      Result Value Range   Glucose-Capillary 101 (*) 70 - 99 mg/dL  GLUCOSE, CAPILLARY     Status: Abnormal   Collection Time    07/01/13 11:32 PM      Result Value Range  Glucose-Capillary 166 (*) 70 - 99 mg/dL  GLUCOSE, CAPILLARY     Status: Abnormal   Collection Time    07/02/13  4:27 AM      Result Value Range   Glucose-Capillary 187 (*) 70 - 99 mg/dL  BASIC METABOLIC PANEL     Status: Abnormal   Collection Time    07/02/13  5:00 AM      Result Value Range   Sodium 144  137 - 147 mEq/L   Potassium 3.1 (*) 3.7 - 5.3 mEq/L   Chloride 107  96 - 112 mEq/L   CO2 25  19 - 32 mEq/L   Glucose, Bld 531 (*) 70 - 99 mg/dL   BUN 29 (*) 6 - 23 mg/dL   Creatinine, Ser 2.19 (*) 0.50 - 1.35 mg/dL   Calcium 8.3 (*) 8.4 - 10.5 mg/dL   GFR calc non Af Amer 30 (*) >90 mL/min   GFR calc Af Amer 35 (*) >90 mL/min  GLUCOSE, CAPILLARY     Status: Abnormal   Collection Time    07/02/13  7:49 AM      Result Value Range   Glucose-Capillary 173 (*) 70 - 99 mg/dL   Dg Chest Port 1 View  07/01/2013   CLINICAL DATA:  Bedside PICC placement.  EXAM: PORTABLE CHEST - 1 VIEW   COMPARISON:  Portable examinations yesterday dating back to 06/26/2013.  FINDINGS: Right arm PICC terminates in the right subclavian vein.  Cardiac silhouette moderately enlarged but stable. Pulmonary venous hypertension with minimal residual interstitial edema, unchanged. No new pulmonary parenchymal abnormalities.  IMPRESSION: 1. Right arm PICC terminates in the right subclavian vein. 2. Stable cardiomegaly and minimal interstitial pulmonary edema. No new abnormalities.   Electronically Signed   By: Evangeline Dakin M.D.   On: 07/01/2013 00:14   Dg Chest Port 1v Same Day  07/01/2013   CLINICAL DATA:  Hypertension.  Possible pulmonary edema.  EXAM: PORTABLE CHEST - 1 VIEW SAME DAY  COMPARISON:  06/30/2013.  FINDINGS: Cardiomegaly with mild pulmonary vascular prominence interstitial prominence noted. Mild component of congestive heart failure should be considered. PICC line noted in good anatomic position. No focal alveolar infiltrates. No pleural effusion or pneumothorax.  IMPRESSION: 1. Findings suggesting mild congestive heart failure with pulmonary interstitial edema .  2. PICC line in good anatomic position.   Electronically Signed   By: Marcello Moores  Register   On: 07/01/2013 14:38    Assessment/Plan: Diagnosis: seizure, hypertensive encephalopathy 1. Does the need for close, 24 hr/day medical supervision in concert with the patient's rehab needs make it unreasonable for this patient to be served in a less intensive setting? Potentially/yes 2. Co-Morbidities requiring supervision/potential complications: seizure, dka, htn, ckd 3. Due to bladder management, bowel management, safety, skin/wound care, disease management, medication administration, pain management and patient education, does the patient require 24 hr/day rehab nursing? Potentially/yes 4. Does the patient require coordinated care of a physician, rehab nurse, PT (1-2 hrs/day, 5 days/week), OT (1-2 hrs/day, 5 days/week) and SLP (1-2 hrs/day, 5  days/week) to address physical and functional deficits in the context of the above medical diagnosis(es)? Potentially Addressing deficits in the following areas: balance, endurance, locomotion, strength, transferring, bowel/bladder control, bathing, dressing, feeding, grooming, toileting, cognition, speech, language, swallowing and psychosocial support 5. Can the patient actively participate in an intensive therapy program of at least 3 hrs of therapy per day at least 5 days per week? Potentially 6. The potential for patient to make measurable gains while  on inpatient rehab is fair 7. Anticipated functional outcomes upon discharge from inpatient rehab are min to mod assist with PT, min to mod assist with OT, min   assist with SLP. 8. Estimated rehab length of stay to reach the above functional goals is: 20-30 days 9. Does the patient have adequate social supports to accommodate these discharge functional goals? No and Potentially 10. Anticipated D/C setting: TBD 11. Anticipated post D/C treatments: [place] therapy 12. Overall Rehab/Functional Prognosis: good and fair  RECOMMENDATIONS: This patient's condition is appropriate for continued rehabilitative care in the following setting:   CIR Patient has agreed to participate in recommended program. Potentially Note that insurance prior authorization may be required for reimbursement for recommended care.  Comment: Apparently family is available to assist. Will follow for functional progress. Was independent PTA.  Thanks,  Meredith Staggers, MD, Mellody Drown     07/02/2013

## 2013-07-02 NOTE — Progress Notes (Signed)
NUTRITION FOLLOW UP  Intervention:    Recommend PEG to provide pt with adequate nutrition considering pt safety. Would be able to provide bolus feedings and cover PEG with abdominal binder while not in use. This would also help with medication administration.   Recommend:  350 ml Jevity 1.2 QID 30 ml Prostat QID 30 ml H2O before and after feedings 240 ml additional H2O flush TID  TF regimen provides: 2110 kcal, 139 grams protein, 1146 ml H2O.  Total free water: 2106 ml   Nutrition Dx:   Inadequate oral intake related to inability to eat as evidenced by NPO status; ongoing.   Goal:  Pt to meet >/= 90% of their estimated nutrition needs, not met.   Monitor:  Diet advancement, PO intake, weight trend, labs   Assessment:   Pt admitted after being found locked in his car. Pt unresponsive and having seizures.  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   Potassium low.   Pt self extubated 1/11 Pt pulled feeding tube 1/14   Pt has also pulled PICC out twice and foley once.  Pt moves around in the bed and is unintentional about pulling lines.  Dysphagia 1 Pudding Thick diet started 1/15 with minimal PO's consumed. Per RN pt is on quite a few oral BP medications. Per RN it took 10 min per pill this am to get pt to swallow pills.  SLP completed FEES 1/16 on pt who had severe dysphagia.   Height: Ht Readings from Last 1 Encounters:  06/21/13 6' (1.829 m)    Weight Status:   Wt Readings from Last 1 Encounters:  07/02/13 385 lb 12.9 oz (175 kg)  Usual weight 392-395 lb (179.5 kg)  Re-estimated needs:  Kcal: 2100-2300 Protein: 120-140 grams Fluid: >2 L/day  Skin: BLE chronic full thickness wounds on calves   Diet Order: Dysphagia   Intake/Output Summary (Last 24 hours) at 07/02/13 1416 Last data filed at 07/02/13 1200  Gross per 24 hour  Intake   2635 ml  Output   1175 ml  Net   1460 ml    Last BM: no bm  documented   Labs:   Recent Labs Lab 06/29/13 0330 06/30/13 0500  07/01/13 0218 07/01/13 1400 07/02/13 0500 07/02/13 1250  NA 161* 158*  < > 156* 158* 144 154*  K 3.4* 2.9*  < > 3.9  --  3.1* 3.6*  CL 121* 121*  < > 120*  --  107 115*  CO2 30 24  < > 22  --  25 27  BUN 29* 27*  < > 26*  --  29* 30*  CREATININE 1.52* 1.56*  < > 1.61*  --  2.19* 2.35*  CALCIUM 9.4 8.3*  < > 9.3  --  8.3* 8.7  MG 1.9 1.8  --   --   --   --   --   PHOS 3.1 3.5  --   --   --   --   --   GLUCOSE 169* 169*  < > 134*  --  531* 189*  < > = values in this interval not displayed.  CBG (last 3)   Recent Labs  07/01/13 2332 07/02/13 0427 07/02/13 0749  GLUCAP 166* 187* 173*   No results found for this basename: HGBA1C   Scheduled Meds: . amLODipine  10 mg Oral Daily  . antiseptic oral rinse  15 mL Mouth Rinse q12n4p  . carvedilol  12.5 mg Oral BID WC  . chlorhexidine  15 mL Mouth Rinse BID  . cloNIDine  0.3 mg Transdermal Weekly  . heparin  5,000 Units Subcutaneous Q8H  . hydrALAZINE  25 mg Oral Q8H  . insulin aspart  0-20 Units Subcutaneous Q4H  . insulin glargine  15 Units Subcutaneous Daily  . [START ON 07/03/2013] pantoprazole  40 mg Oral Daily  . sodium chloride  10-40 mL Intracatheter Q12H    Continuous Infusions: . dextrose 125 mL/hr at 07/02/13 [place], LDN, [place] Pager [phone] After Hours Pager

## 2013-07-02 NOTE — Progress Notes (Signed)
Pt arrived to the unit from 55M.  VSS, Family aware of new room number.  Notified elink and CMT of arrival Will continue to monitor

## 2013-07-02 NOTE — Progress Notes (Signed)
OT Cancellation Note  Patient Details Name: Blake Bell MRN: [id number] DOB: 1950/09/28   Cancelled Treatment:    Reason Eval/Treat Not Completed: Patient at procedure or test/ unavailable  [place], OTR/L  [phone] 07/02/2013 07/02/2013, 4:50 PM

## 2013-07-03 DIAGNOSIS — N179 Acute kidney failure, unspecified: Secondary | ICD-10-CM | POA: Diagnosis present

## 2013-07-03 DIAGNOSIS — I83009 Varicose veins of unspecified lower extremity with ulcer of unspecified site: Secondary | ICD-10-CM | POA: Diagnosis present

## 2013-07-03 DIAGNOSIS — E119 Type 2 diabetes mellitus without complications: Secondary | ICD-10-CM

## 2013-07-03 DIAGNOSIS — E870 Hyperosmolality and hypernatremia: Secondary | ICD-10-CM | POA: Diagnosis not present

## 2013-07-03 DIAGNOSIS — E1110 Type 2 diabetes mellitus with ketoacidosis without coma: Secondary | ICD-10-CM | POA: Diagnosis present

## 2013-07-03 DIAGNOSIS — L97909 Non-pressure chronic ulcer of unspecified part of unspecified lower leg with unspecified severity: Secondary | ICD-10-CM

## 2013-07-03 LAB — URINE MICROSCOPIC-ADD ON

## 2013-07-03 LAB — URINALYSIS, ROUTINE W REFLEX MICROSCOPIC
Bilirubin Urine: NEGATIVE
GLUCOSE, UA: NEGATIVE mg/dL
KETONES UR: NEGATIVE mg/dL
NITRITE: NEGATIVE
PH: 6 (ref 5.0–8.0)
Protein, ur: >300 mg/dL — AB
SPECIFIC GRAVITY, URINE: 1.018 (ref 1.005–1.030)
Urobilinogen, UA: 1 mg/dL (ref 0.0–1.0)

## 2013-07-03 LAB — BASIC METABOLIC PANEL
BUN: 31 mg/dL — AB (ref 6–23)
CHLORIDE: 114 meq/L — AB (ref 96–112)
CO2: 27 mEq/L (ref 19–32)
Calcium: 8.5 mg/dL (ref 8.4–10.5)
Creatinine, Ser: 2.53 mg/dL — ABNORMAL HIGH (ref 0.50–1.35)
GFR calc Af Amer: 30 mL/min — ABNORMAL LOW (ref >90)
GFR, EST NON AFRICAN AMERICAN: 26 mL/min — AB (ref >90)
GLUCOSE: 181 mg/dL — AB (ref 70–99)
Potassium: 3.4 mEq/L — ABNORMAL LOW (ref 3.7–5.3)
Sodium: 151 mEq/L — ABNORMAL HIGH (ref 137–147)

## 2013-07-03 LAB — GLUCOSE, CAPILLARY
GLUCOSE-CAPILLARY: 180 mg/dL — AB (ref 70–99)
Glucose-Capillary: 178 mg/dL — ABNORMAL HIGH (ref 70–99)
Glucose-Capillary: 161 mg/dL — ABNORMAL HIGH (ref 70–99)
Glucose-Capillary: 163 mg/dL — ABNORMAL HIGH (ref 70–99)
Glucose-Capillary: 227 mg/dL — ABNORMAL HIGH (ref 70–99)
Glucose-Capillary: 155 mg/dL — ABNORMAL HIGH (ref 70–99)

## 2013-07-03 MED ORDER — INSULIN ASPART 100 UNIT/ML ~~LOC~~ SOLN
0.0000 [IU] | Freq: Three times a day (TID) | SUBCUTANEOUS | Status: DC
  Administered 2013-07-04 – 2013-07-05 (×3): 3 [IU] via SUBCUTANEOUS

## 2013-07-03 MED ORDER — LORAZEPAM 2 MG/ML IJ SOLN
1.0000 mg | Freq: Once | INTRAMUSCULAR | Status: AC
  Administered 2013-07-03: 1 mg via INTRAVENOUS
  Filled 2013-07-03: qty 1

## 2013-07-03 NOTE — Progress Notes (Signed)
Upon returning from [facility] another Pt, found this Pt prone on the floor. Staff assisted Pt back to bed via hoyer lift. Assessed Pt, VSS, BP 145/68, pulse 74.  Small cut on inner lip. Called DR & family.  No new orders. No sitter tech available; will pull sitter from from floor to sit w/Pt.

## 2013-07-03 NOTE — Progress Notes (Signed)
[facility] Progress Note [place] TEAM 1 - Stepdown/ICU TEAM   Ettore Trebilcock JKD:[id number] DOB: 1951/03/16 DOA: 06/21/2013 PCP: [name], MD  Brief narrative: 63 y/o male with h/o HTN, DM, OSA, CKD admitted for seizures and intubated in ER.   SIGNIFICANT EVENTS:  1/5 Admit, neurology consulted  1/6 Off insulin gtt  1/7 Possible aspiration of tube feeds  1/11 Self extubated, put on BiPAP  1/12 remains on nicardipine  1/13 WBC trending up, BIPAP improving  1/14- self dc'ed panda, 4hr on/4hrs off BiPAP  1/15- Diet initiated, PICC replaced  STUDIES:  1/5 CT head >> mild atrophy with patchy small vessel disease, ?aneurysm Rt superior to sella  1/5 CT angio head >> no aneurysm, 13 mm mass Rt sella/suprasellar region  1/6 EEG >> normal sleep EEG  1/8 Echo >> mild LVH, EF 55 to 60%, mod LA dilation  1/12 CXR >> Slight improvement in congestive heart failure or interstitial edema  1/14 CXR>> Stable cardiomegaly, minimal interstitial pulmonary edema.  CXR 1/15 PM >> Continued Pulm edema.   LINES / TUBES:  1/5 ETT>>> 1/11  1/7 Rt PICC>>> exchanged over guidewire 1/15   CULTURES:  1/5 BC>>> Neg  1/5 MRSA >> positive   Subjective: Pt evaluated this AM- very restless but oriented- He was able to give me a history that he had been non-compliant with medications and with f/u with a PCP- no current complaints.   Assessment/Plan: Principal Problem:   Seizure - suspected to be due to malignant HTN and DKA and possibly PRESS however due to patient size, unable to obtain MRI to confirm PRESS - no AED recommended by neurology at this time  Active Problems:      Altered mental status/ acute hypertensive encephalopathy - very restless still- needing a sitter and b/l wrist restraints due to agitation- PRN Haldol     DKA - DM (diabetes mellitus), type 2 - now resolved- does not appear to be on home meds for DM - HBa1c not obtained- will order - current on Lantus and  Novolog- also on D5 for hyperglycemia  OSA/ OHS - will need BiPAP QHS- previously on CPAP    Malignant hypertension - BP better controlled on current medications    Sellar or suprasellar mass - 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 per radiology read - will need MRI dedicated to the sella to better characterize - will need to wait until he is less agitated and more cooperative to perform the MRI - f/u prolactin levels      AKI (acute kidney injury)/ CKD (chronic kidney disease) stage 3, GFR 30-59 ml/min - Cr improved but now rising again - does not appear to be prerenal -  check UA- f/u in AM-     Acute respiratory failure - in setting of encephalopathy and seizures    Hypernatremia -cont D5W    Morbid obesity    Venous stasis ulcers - stable   Code Status: Full code Family Communication: none Disposition Plan: to be determined  Consultants: Neurology  Antibiotics: Acyclovir 1/5>>>1/8    DVT prophylaxis: Heparin  Objective: Blood pressure 137/72, pulse 73, temperature 98 F (36.7 C), temperature source Oral, resp. rate 24, height 6' (1.829 m), weight 175 kg (385 lb 12.9 oz), SpO2 100.00%.  Intake/Output Summary (Last 24 hours) at 07/03/13 0859 Last data filed at 07/03/13 0500  Gross per 24 hour  Intake   2025 ml  Output      0 ml  Net   2025  ml     Exam: General: agitated, laying on his abdomen, EKG leads off, oriented x 3, able to communicate appropriately No acute respiratory distress Lungs: Clear to auscultation bilaterally without wheezes or crackles Cardiovascular: Regular rate and rhythm without murmur gallop or rub normal S1 and S2 Abdomen: Nontender, nondistended, soft, bowel sounds positive, no rebound, no ascites, no appreciable mass Extremities: No significant cyanosis, clubbing, or edema bilateral lower extremities- venous statis ulcers noted  Data Reviewed: Basic Metabolic Panel:  Recent Labs Lab 06/29/13 0330 06/30/13 0500  06/30/13 2056 07/01/13 0218 07/01/13 1400 07/02/13 0500 07/02/13 1250 07/03/13 0548  NA 161* 158* 156* 156* 158* 144 154* 151*  K 3.4* 2.9* 3.7 3.9  --  3.1* 3.6* 3.4*  CL 121* 121* 119* 120*  --  107 115* 114*  CO2 30 24 27 22   --  25 27 27   GLUCOSE 169* 169* 191* 134*  --  531* 189* 181*  BUN 29* 27* 28* 26*  --  29* 30* 31*  CREATININE 1.52* 1.56* 1.63* 1.61*  --  2.19* 2.35* 2.53*  CALCIUM 9.4 8.3* 9.4 9.3  --  8.3* 8.7 8.5  MG 1.9 1.8  --   --   --   --   --   --   PHOS 3.1 3.5  --   --   --   --   --   --    Liver Function Tests: No results found for this basename: AST, ALT, ALKPHOS, BILITOT, PROT, ALBUMIN,  in the last 168 hours No results found for this basename: LIPASE, AMYLASE,  in the last 168 hours No results found for this basename: AMMONIA,  in the last 168 hours CBC:  Recent Labs Lab 06/27/13 0417 06/29/13 0330 06/30/13 0500 07/01/13 0218  WBC 10.6* 12.2* 9.2 9.8  NEUTROABS  --  8.3*  --   --   HGB 12.3* 13.6 11.6* 13.3  HCT 38.1* 43.2 37.4* 42.7  MCV 92.5 96.0 95.2 93.8  PLT 320 365 326 288   Cardiac Enzymes: No results found for this basename: CKTOTAL, CKMB, CKMBINDEX, TROPONINI,  in the last 168 hours BNP (last 3 results)  Recent Labs  06/29/13 0330  PROBNP 1004.0*   CBG:  Recent Labs Lab 07/02/13 1555 07/02/13 2001 07/03/13 0009 07/03/13 0425 07/03/13 0736  GLUCAP 166* 137* 178* 163* 161*    No results found for this or any previous visit (from the past 240 hour(s)).   Studies:  Recent x-ray studies have been reviewed in detail by the Attending Physician  Scheduled Meds:  Scheduled Meds: . amLODipine  10 mg Oral Daily  . antiseptic oral rinse  15 mL Mouth Rinse q12n4p  . carvedilol  12.5 mg Oral BID WC  . chlorhexidine  15 mL Mouth Rinse BID  . cloNIDine  0.3 mg Transdermal Weekly  . heparin  5,000 Units Subcutaneous Q8H  . hydrALAZINE  25 mg Oral Q8H  . insulin aspart  0-20 Units Subcutaneous Q4H  . insulin glargine  15  Units Subcutaneous Daily  . pantoprazole  40 mg Oral Daily  . sodium chloride  10-40 mL Intracatheter Q12H   Continuous Infusions: . dextrose 125 mL/hr at 07/02/13 1900    Time spent on care of this patient: 5 min   [place], MD  [facility] Office  [phone] Pager - Text Page per Shea Evans as per below:  On-Call/Text Page:      [email or web address]      password [email or web address]  If 7PM-7AM,  please contact night-coverage [email or web address] Password [email or web address] 07/03/2013, 8:59 AM   LOS: 12 days

## 2013-07-04 DIAGNOSIS — N179 Acute kidney failure, unspecified: Secondary | ICD-10-CM

## 2013-07-04 LAB — BLOOD GAS, ARTERIAL
Acid-Base Excess: 2.4 mmol/L — ABNORMAL HIGH (ref 0.0–2.0)
Bicarbonate: 27.2 meq/L — ABNORMAL HIGH (ref 20.0–24.0)
Drawn by: 24610
O2 Content: 2 L/min
O2 Saturation: 95.9 %
Patient temperature: 98.6
TCO2: 28.6 mmol/L (ref 0–100)
pCO2 arterial: 48.1 mmHg — ABNORMAL HIGH (ref 35.0–45.0)
pH, Arterial: 7.371 (ref 7.350–7.450)
pO2, Arterial: 82.1 mmHg (ref 80.0–100.0)

## 2013-07-04 LAB — BASIC METABOLIC PANEL
BUN: 28 mg/dL — ABNORMAL HIGH (ref 6–23)
CO2: 25 meq/L (ref 19–32)
CREATININE: 2.25 mg/dL — AB (ref 0.50–1.35)
Calcium: 8.7 mg/dL (ref 8.4–10.5)
Chloride: 111 mEq/L (ref 96–112)
GFR calc Af Amer: 34 mL/min — ABNORMAL LOW (ref >90)
GFR calc non Af Amer: 30 mL/min — ABNORMAL LOW (ref >90)
Glucose, Bld: 211 mg/dL — ABNORMAL HIGH (ref 70–99)
Potassium: 3.2 mEq/L — ABNORMAL LOW (ref 3.7–5.3)
SODIUM: 149 meq/L — AB (ref 137–147)

## 2013-07-04 LAB — CBC
HEMATOCRIT: 36.1 % — AB (ref 39.0–52.0)
Hemoglobin: 11.6 g/dL — ABNORMAL LOW (ref 13.0–17.0)
MCH: 29.8 pg (ref 26.0–34.0)
MCHC: 32.1 g/dL (ref 30.0–36.0)
MCV: 92.8 fL (ref 78.0–100.0)
Platelets: 333 10*3/uL (ref 150–400)
RBC: 3.89 MIL/uL — ABNORMAL LOW (ref 4.22–5.81)
RDW: 17.2 % — ABNORMAL HIGH (ref 11.5–15.5)
WBC: 8.2 10*3/uL (ref 4.0–10.5)

## 2013-07-04 LAB — HEMOGLOBIN A1C
HEMOGLOBIN A1C: 10.5 % — AB (ref <5.7)
MEAN PLASMA GLUCOSE: 255 mg/dL — AB (ref <117)

## 2013-07-04 LAB — GLUCOSE, CAPILLARY
GLUCOSE-CAPILLARY: 73 mg/dL (ref 70–99)
Glucose-Capillary: 144 mg/dL — ABNORMAL HIGH (ref 70–99)
Glucose-Capillary: 147 mg/dL — ABNORMAL HIGH (ref 70–99)
Glucose-Capillary: 101 mg/dL — ABNORMAL HIGH (ref 70–99)

## 2013-07-04 MED ORDER — LABETALOL HCL 200 MG PO TABS
200.0000 mg | ORAL_TABLET | Freq: Two times a day (BID) | ORAL | Status: DC
  Administered 2013-07-04 – 2013-07-13 (×18): 200 mg via ORAL
  Filled 2013-07-04 (×22): qty 1

## 2013-07-04 MED ORDER — DEXTROSE 5 % IV SOLN
1.0000 g | INTRAVENOUS | Status: DC
Start: 2013-07-04 — End: 2013-07-05
  Administered 2013-07-04: 1 g via INTRAVENOUS
  Filled 2013-07-04 (×3): qty 10

## 2013-07-04 MED ORDER — CARVEDILOL 25 MG PO TABS: 25.0000 mg | ORAL_TABLET | Freq: Two times a day (BID) | ORAL | Status: DC

## 2013-07-04 MED ORDER — POTASSIUM CHLORIDE CRYS ER 20 MEQ PO TBCR
40.0000 meq | EXTENDED_RELEASE_TABLET | Freq: Once | ORAL | Status: AC
  Administered 2013-07-04: 40 meq via ORAL
  Filled 2013-07-04: qty 2

## 2013-07-04 MED ORDER — LORAZEPAM 2 MG/ML IJ SOLN
1.0000 mg | Freq: Once | INTRAMUSCULAR | Status: AC
  Administered 2013-07-04: 1 mg via INTRAVENOUS
  Filled 2013-07-04: qty 1

## 2013-07-04 MED ORDER — HALOPERIDOL LACTATE 5 MG/ML IJ SOLN: 2.0000 mg | Freq: Four times a day (QID) | INTRAMUSCULAR | Status: DC | PRN

## 2013-07-04 MED ORDER — LABETALOL HCL 200 MG PO TABS
200.0000 mg | ORAL_TABLET | Freq: Two times a day (BID) | ORAL | Status: DC
Start: 2013-07-04 — End: 2013-07-04

## 2013-07-04 MED ORDER — HYDRALAZINE HCL 50 MG PO TABS: 50.0000 mg | ORAL_TABLET | Freq: Three times a day (TID) | ORAL | Status: DC

## 2013-07-04 MED ORDER — HYDRALAZINE HCL 25 MG PO TABS
25.0000 mg | ORAL_TABLET | Freq: Three times a day (TID) | ORAL | Status: DC
  Administered 2013-07-04 – 2013-07-07 (×9): 25 mg via ORAL
  Filled 2013-07-04 (×11): qty 1

## 2013-07-04 MED ORDER — SODIUM CHLORIDE 0.9 % IV SOLN
INTRAVENOUS | Status: DC
  Administered 2013-07-04: 13:00:00 via INTRAVENOUS

## 2013-07-04 MED ORDER — SODIUM CHLORIDE 0.45 % IV SOLN
INTRAVENOUS | Status: DC
  Administered 2013-07-04: 17:00:00 via INTRAVENOUS

## 2013-07-04 NOTE — Progress Notes (Signed)
Pt placed on bipap with no issues at this time.  RT told in report that pt was combative at times and may need nasal mask to tolerate bipap.  bipap not used with nasal mask at this facility.  Pt tolerating ff well.

## 2013-07-04 NOTE — Progress Notes (Addendum)
[facility] Progress Note [place] TEAM 1 - Stepdown/ICU TEAM   Rathana Viveros YWV:[id number] DOB: 04-04-1951 DOA: 06/21/2013 PCP: [name], MD  Brief narrative: 63 y/o male with h/o HTN, DM, OSA, CKD admitted for seizures and intubated in ER.   SIGNIFICANT EVENTS:  1/5 Admit, neurology consulted  1/6 Off insulin gtt  1/7 Possible aspiration of tube feeds  1/11 Self extubated, put on BiPAP  1/12 remains on nicardipine  1/13 WBC trending up, BIPAP improving  1/14- self dc'ed panda, 4hr on/4hrs off BiPAP  1/15- Diet initiated, PICC replaced  STUDIES:  1/5 CT head >> mild atrophy with patchy small vessel disease, ?aneurysm Rt superior to sella  1/5 CT angio head >> no aneurysm, 13 mm mass Rt sella/suprasellar region  1/6 EEG >> normal sleep EEG  1/8 Echo >> mild LVH, EF 55 to 60%, mod LA dilation  1/12 CXR >> Slight improvement in congestive heart failure or interstitial edema  1/14 CXR>> Stable cardiomegaly, minimal interstitial pulmonary edema.  CXR 1/15 PM >> Continued Pulm edema.   LINES / TUBES:  1/5 ETT>>> 1/11  1/7 Rt PICC>>> exchanged over guidewire 1/15   CULTURES:  1/5 BC>>> Neg  1/5 MRSA >> positive   Subjective: Pt still quite agitated- required Haldol through the night  Assessment/Plan: Principal Problem:   Seizure - suspected to be due to malignant HTN and DKA and possibly PRESS however due to patient size, unable to obtain MRI to confirm PRESS - no AED recommended by neurology at this time  Active Problems:     Malignant hypertension - unfortunately was sedated with Haldol this AM and was not able to take PO meds- BP running high- will attempt to control with IV Labetalol  - change PO coreg to Labetalol as well.     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 per radiology read - will need MRI dedicated to the sella to better characterize - will need to wait until he is less agitated and more cooperative to perform the MRI - f/u prolactin levels      AKI (acute kidney injury)/ CKD (chronic kidney disease) stage 3, GFR 30-59 ml/min - Cr rose slightly but now improving     Acute respiratory failure - in setting of encephalopathy and seizures    Hypernatremia -- improving with hydration    Morbid obesity    Venous stasis ulcers - stable   Code Status: Full code Family Communication: none Disposition Plan: cont SDU for BiPAP QHS- PT eval  Consultants: Neurology  Antibiotics: Acyclovir 1/5>>>1/8    DVT prophylaxis: Heparin  Objective: Blood pressure 165/92, pulse 73, temperature 98.4 F (36.9 C), temperature source Oral, resp. rate 21, height 6' (1.829 m), weight 175 kg (385 lb 12.9 oz), SpO2 98.00%.  Intake/Output Summary (Last 24 hours) at 07/04/13 1430 Last data filed at 07/04/13 1200  Gross per 24 hour  Intake  2300 ml  Output      0 ml  Net   2300 ml     Exam: General: agitated, laying on his abdomen, EKG leads off, oriented x 3, able to communicate appropriately No acute respiratory distress Lungs: Clear to auscultation bilaterally without wheezes or crackles Cardiovascular: Regular rate and rhythm without murmur gallop or rub normal S1 and S2 Abdomen: Nontender, nondistended, soft, bowel sounds positive, no rebound, no ascites, no appreciable mass Extremities: No significant cyanosis, clubbing, or edema bilateral lower extremities- venous statis ulcers  noted  Data Reviewed: Basic Metabolic Panel:  Recent Labs Lab 06/29/13 0330 06/30/13 0500  07/01/13 0218 07/01/13 1400 07/02/13 0500 07/02/13 1250 07/03/13 0548 07/04/13 0245  NA 161* 158*  < > 156* 158* 144 154* 151* 149*  K 3.4* 2.9*  < > 3.9  --  3.1* 3.6* 3.4* 3.2*  CL 121* 121*  < > 120*  --  107 115* 114* 111  CO2 30 24  < > 22  --  25 27 27 25   GLUCOSE 169* 169*  < > 134*  --  531* 189* 181* 211*  BUN 29* 27*  < > 26*  --  29* 30* 31* 28*  CREATININE 1.52* 1.56*  < > 1.61*  --  2.19* 2.35* 2.53* 2.25*  CALCIUM 9.4 8.3*  < > 9.3  --  8.3* 8.7 8.5 8.7  MG 1.9 1.8  --   --   --   --   --   --   --   PHOS 3.1 3.5  --   --   --   --   --   --   --   < > = values in this interval not displayed. Liver Function Tests: No results found for this basename: AST, ALT, ALKPHOS, BILITOT, PROT, ALBUMIN,  in the last 168 hours No results found for this basename: LIPASE, AMYLASE,  in the last 168 hours No results found for this basename: AMMONIA,  in the last 168 hours CBC:  Recent Labs Lab 06/29/13 0330 06/30/13 0500 07/01/13 0218 07/04/13 0245  WBC 12.2* 9.2 9.8 8.2  NEUTROABS 8.3*  --   --   --   HGB 13.6 11.6* 13.3 11.6*  HCT 43.2 37.4* 42.7 36.1*  MCV 96.0 95.2 93.8 92.8  PLT 365 326 288 333   Cardiac Enzymes: No results found for this basename: CKTOTAL, CKMB, CKMBINDEX, TROPONINI,  in the last 168 hours BNP (last 3 results)  Recent Labs  06/29/13 0330  PROBNP 1004.0*   CBG:  Recent Labs Lab 07/03/13 1149 07/03/13 1744 07/03/13 1915 07/04/13 0828 07/04/13 1232  GLUCAP 227* 155* 180* 144* 147*    No results found for this or any previous visit (from the past 240 hour(s)).   Studies:  Recent x-ray studies have been reviewed in detail by the Attending Physician  Scheduled Meds:  Scheduled Meds: . amLODipine  10 mg Oral Daily  . antiseptic oral rinse  15 mL Mouth Rinse q12n4p  . carvedilol  12.5 mg Oral BID WC  . cefTRIAXone (ROCEPHIN)  IV  1 g  Intravenous Q24H  . chlorhexidine  15 mL Mouth Rinse BID  . cloNIDine  0.3 mg Transdermal Weekly  . heparin  5,000 Units Subcutaneous Q8H  . hydrALAZINE  25 mg Oral Q8H  . insulin aspart  0-20 Units Subcutaneous TID WC  . insulin glargine  15 Units Subcutaneous Daily  . pantoprazole  40 mg Oral Daily  . sodium  chloride  10-40 mL Intracatheter Q12H   Continuous Infusions: . sodium chloride 125 mL/hr at 07/04/13 1257    Time spent on care of this patient: 72 min   [place], MD  [facility] Office  [phone] Pager - Text Page per Shea Evans as per below:  On-Call/Text Page:      [email or web address]      password [email or web address]  If 7PM-7AM, please contact night-coverage [email or web address] Password [email or web address] 07/04/2013, 2:30 PM   LOS: 13 days

## 2013-07-04 NOTE — Progress Notes (Signed)
Dr. Wynelle Cleveland order a soft waist restraint and mittens in lieu of soft wrist restraints.  Three nurses tried to fit the waist restraint safely and were unsuccessful due to his size.  Dr. Wynelle Cleveland was made aware.  She wants to avoid wrist restaints bc the pt needs to be on BiPap all night.  If wrist restaints are needed, then the pt needs to be on the nasal mask not full face mask.  RT, Judson Roch, was also made aware of Dr. Reggy Eye requests.  She also does not want Haldol to be given bf AM shift change to prevent the pt from being lethargic during the day.

## 2013-07-04 NOTE — Progress Notes (Signed)
Rt Note: RT attempted to place pt on BIPAP per QHS order. Pt in restraints and fighting RT to place mask. Pt tolerating well on 2L [place] SPO2 97% RR 18, HR 76. RT will continue to monitor

## 2013-07-05 LAB — GLUCOSE, CAPILLARY
GLUCOSE-CAPILLARY: 143 mg/dL — AB (ref 70–99)
GLUCOSE-CAPILLARY: 217 mg/dL — AB (ref 70–99)
Glucose-Capillary: 232 mg/dL — ABNORMAL HIGH (ref 70–99)
Glucose-Capillary: 240 mg/dL — ABNORMAL HIGH (ref 70–99)
Glucose-Capillary: 229 mg/dL — ABNORMAL HIGH (ref 70–99)

## 2013-07-05 LAB — BASIC METABOLIC PANEL
BUN: 25 mg/dL — AB (ref 6–23)
CHLORIDE: 114 meq/L — AB (ref 96–112)
CO2: 20 mEq/L (ref 19–32)
Calcium: 8.6 mg/dL (ref 8.4–10.5)
Creatinine, Ser: 2.05 mg/dL — ABNORMAL HIGH (ref 0.50–1.35)
GFR calc non Af Amer: 33 mL/min — ABNORMAL LOW (ref >90)
GFR, EST AFRICAN AMERICAN: 38 mL/min — AB (ref >90)
Glucose, Bld: 122 mg/dL — ABNORMAL HIGH (ref 70–99)
Potassium: 4.2 mEq/L (ref 3.7–5.3)
Sodium: 149 mEq/L — ABNORMAL HIGH (ref 137–147)

## 2013-07-05 MED ORDER — ENSURE PUDDING PO PUDG
1.0000 | Freq: Three times a day (TID) | ORAL | Status: DC
  Administered 2013-07-05 – 2013-07-13 (×21): 1 via ORAL

## 2013-07-05 MED ORDER — CABERGOLINE 0.5 MG PO TABS
0.2500 mg | ORAL_TABLET | ORAL | Status: DC
  Filled 2013-07-05: qty 1

## 2013-07-05 MED ORDER — INSULIN ASPART 100 UNIT/ML ~~LOC~~ SOLN
0.0000 [IU] | Freq: Three times a day (TID) | SUBCUTANEOUS | Status: DC
  Administered 2013-07-05 (×2): 7 [IU] via SUBCUTANEOUS
  Administered 2013-07-06: 4 [IU] via SUBCUTANEOUS
  Administered 2013-07-06: 7 [IU] via SUBCUTANEOUS
  Administered 2013-07-06: 4 [IU] via SUBCUTANEOUS
  Administered 2013-07-07: 11 [IU] via SUBCUTANEOUS
  Administered 2013-07-07 – 2013-07-08 (×3): 4 [IU] via SUBCUTANEOUS
  Administered 2013-07-08: 3 [IU] via SUBCUTANEOUS
  Administered 2013-07-08: 4 [IU] via SUBCUTANEOUS
  Administered 2013-07-09: 7 [IU] via SUBCUTANEOUS
  Administered 2013-07-09 – 2013-07-10 (×3): 4 [IU] via SUBCUTANEOUS
  Administered 2013-07-10: 3 [IU] via SUBCUTANEOUS
  Administered 2013-07-10: 4 [IU] via SUBCUTANEOUS
  Administered 2013-07-11 – 2013-07-13 (×4): 3 [IU] via SUBCUTANEOUS

## 2013-07-05 MED ORDER — CEFUROXIME AXETIL 500 MG PO TABS
500.0000 mg | ORAL_TABLET | Freq: Two times a day (BID) | ORAL | Status: DC
  Administered 2013-07-05 – 2013-07-06 (×2): 500 mg via ORAL
  Filled 2013-07-05 (×4): qty 1

## 2013-07-05 MED ORDER — INSULIN ASPART 100 UNIT/ML ~~LOC~~ SOLN: 0.0000 [IU] | Freq: Three times a day (TID) | SUBCUTANEOUS | Status: DC

## 2013-07-05 MED ORDER — INSULIN GLARGINE 100 UNIT/ML ~~LOC~~ SOLN
20.0000 [IU] | Freq: Every day | SUBCUTANEOUS | Status: DC
  Administered 2013-07-06 – 2013-07-07 (×2): 20 [IU] via SUBCUTANEOUS
  Filled 2013-07-05 (×2): qty 0.2

## 2013-07-05 MED ORDER — PHENOL 1.4 % MT LIQD: 1.0000 | OROMUCOSAL | Status: DC | PRN

## 2013-07-05 MED ORDER — CABERGOLINE 0.5 MG PO TABS
0.2500 mg | ORAL_TABLET | ORAL | Status: DC
  Administered 2013-07-05: 0.25 mg via ORAL
  Filled 2013-07-05 (×4): qty 8

## 2013-07-05 MED ORDER — CABERGOLINE 0.5 MG PO TABS: 0.2500 mg | ORAL_TABLET | ORAL | Status: DC

## 2013-07-05 NOTE — Progress Notes (Signed)
Patient does not want to wear bipap. Patient wanted to wear his nasal cannula. RT told patient that if his work of breathing goes up or his Sp02 drops that RT will have to place him on bipap. RT will continue to monitor.

## 2013-07-05 NOTE — Progress Notes (Signed)
Physical Therapy Treatment Patient Details Name: Blake Bell MRN: [id number] DOB: 1951/05/20 Today's Date: 07/05/2013 Time: 4481-8563 PT Time Calculation (min): 34 min  PT Assessment / Plan / Recommendation  History of Present Illness  63 yo male with altered mental status and seizures. Intubated for airway protection. BP in ER 214/148. PCCM asked to admit to ICU. Self extubated 06/27/13. Requires 6L O2 to maintain oxygenation. Very lethargic since extubation and difficult to arouse/assess.    PT Comments   Pt with improved cognition and ability to stand this date. Pt con't to demo R sided weakness compared to L. Pt con't to be appropriate for CIR upon d/c to achieve maximal functional recovery for safe transition home.   Follow Up Recommendations  CIR     Does the patient have the potential to tolerate intense rehabilitation     Barriers to Discharge        Equipment Recommendations       Recommendations for Other Services    Frequency Min 4X/week   Progress towards PT Goals Progress towards PT goals: Progressing toward goals  Plan Current plan remains appropriate    Precautions / Restrictions Precautions Precautions: Fall Precaution Comments: Pt 385 pounds Restrictions Weight Bearing Restrictions: No   Pertinent Vitals/Pain Denies pain    Mobility  Bed Mobility Overal bed mobility: +2 for physical assistance;Needs Assistance Bed Mobility: Supine to Sit;Sit to Supine;Rolling Rolling: Max assist Supine to sit: +2 for physical assistance;Total assist Sit to supine: +2 for physical assistance;Total assist General bed mobility comments: max directional v/c's. assist for trunk elevation and to bring LEs off EOB Transfers Overall transfer level: Needs assistance Transfers: Sit to/from Stand Sit to Stand: +2 physical assistance;Max assist (plus 1 to provide assist from behind and front) General transfer comment: completed standing x 1 for 60 seconds. pt with strong R  lateral bias. complete 2 1/2 stands to scoot up to [facility]. max v/c's to maintain upright position. maxA at hips to achieve full hip extension    Exercises General Exercises - Lower Extremity Long Arc Quad: AROM;10 reps;Seated;Both   PT Diagnosis:    PT Problem List:   PT Treatment Interventions:     PT Goals (current goals can now be found in the care plan section)    Visit Information  Last PT Received On: 07/05/13 Assistance Needed: +2 PT/OT/SLP Co-Evaluation/Treatment: Yes Reason for Co-Treatment: Complexity of the patient's impairments (multi-system involvement) PT goals addressed during session: Mobility/safety with mobility History of Present Illness:  63 yo male with altered mental status and seizures. Intubated for airway protection. BP in ER 214/148. PCCM asked to admit to ICU. Self extubated 06/27/13. Requires 6L O2 to maintain oxygenation. Very lethargic since extubation and difficult to arouse/assess.     Subjective Data      Cognition  Cognition Arousal/Alertness: Awake/alert Behavior During Therapy: WFL for tasks assessed/performed Overall Cognitive Status: Impaired/Different from baseline Area of Impairment: Following commands;Safety/judgement;Awareness;Problem solving Following Commands: Follows one step commands with increased time (requires freq v/c's) Safety/Judgement: Decreased awareness of safety;Decreased awareness of deficits Awareness: Intellectual Problem Solving: Slow processing;Decreased initiation;Difficulty sequencing;Requires verbal cues;Requires tactile cues    Balance  Balance Overall balance assessment: Needs assistance Sitting-balance support: Feet supported Sitting balance-Leahy Scale: Fair Sitting balance - Comments: pt sat EOB x 15, pt with posterior bias Postural control: Posterior lean Standing balance support: Bilateral upper extremity supported Standing balance-Leahy Scale: Poor Standing balance comment: R lateral bias, difficulty with  use of R UE funcitonally  End of Session  PT - End of Session Equipment Utilized During Treatment: Gait belt;Oxygen;Other (comment) Activity Tolerance: Patient tolerated treatment well Patient left: in bed;with call bell/phone within reach;with family/visitor present;with nursing/sitter in room Nurse Communication: Mobility status;Need for lift equipment   GP     [name] 07/05/2013, 3:56 PM  [name], PT, DPT Pager #: [phone] Office #: [phone]

## 2013-07-05 NOTE — Progress Notes (Signed)
Nursing Pt awake and continuously pulling at bipap.  Reapplied multiple times.  Pt will not wearing bipap and fighting staff.  Bipap removed, oxygen at 3 liters [place] reapplied.  Pt more calm.  Sitter at bedside.

## 2013-07-05 NOTE — Progress Notes (Signed)
Progress Note [place] TEAM 1 - Stepdown/ICU TEAM   Blake Bell [id number] DOB: Feb 26, 1951 DOA: 06/21/2013 PCP: [name], MD  Brief narrative: 63 y/o male with h/o HTN, DM, OSA, CKD admitted for seizures and intubated in ER.   SIGNIFICANT EVENTS:  1/5 Admit, Neurology consulted  1/6 Off insulin gtt  1/7 Possible aspiration of tube feeds  1/11 Self extubated, put on BiPAP  1/12 on nicardipine  1/13 WBC trending up, BIPAP improving  1/14 self dc'ed panda, 4hr on/4hrs off BiPAP  1/15 Diet initiated, PICC replaced  STUDIES:  1/5 CT head >> mild atrophy with patchy small vessel disease, ?aneurysm Rt superior to sella  1/5 CT angio head >> no aneurysm, 13 mm mass Rt sella/suprasellar region  1/6 EEG >> normal sleep EEG  1/8 Echo >> mild LVH, EF 55 to 60%, mod LA dilation  1/12 CXR >> Slight improvement in congestive heart failure/interstitial edema  1/14 CXR>> Stable cardiomegaly, minimal interstitial pulmonary edema.  CXR 1/15 PM >> Continued Pulm edema.   LINES / TUBES:  1/5 ETT>>> 1/11  1/7 Rt PICC>>> exchanged over guidewire 1/15   CULTURES:  1/5 BC>>> Neg  1/5 MRSA >> positive   Subjective: Patient is alert and conversant though speech is very slow and deliberate and at times unintelligible.  Patient cannot provide a reliable history.  Assessment/Plan:  Seizure - suspected to be due to malignant HTN and DKA and possibly PRESS however due to patient size, unable to obtain MRI to confirm PRESS - no AED recommended by Neurology at this time  Malignant hypertension - Blood pressure much better controlled today - continue current medication without change  Altered mental status / acute hypertensive encephalopathy - very restless still and confused- needing a sitter (at times not available) and restraints - PRN Haldol - trying to keep awake during the day to allow him to take his meds and food - consider repeat imaging of head if improvement is not  noted soon  DKA - DM (diabetes mellitus), type 2 - now resolved- does not appear to be on home meds for DM - HBa1c 10.5 - Adjust insulin dosing and CBG is climbing  Hypokalemia - Has normalized   Proteus UTI - Continue Rocephin - followup sensitivities when available  OSA/ OHS - will need BiPAP QHS- previously on CPAP  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  - 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 per radiology read - will need MRI dedicated to the sella to better characterize - will need to wait until he is less agitated and more cooperative to perform the MRI - prolactin levels severely elevated w/ markedly suppressed LH and FSH - will initiate cabergoline given size of mass as well as prolactin level - unable to screen pt for sx as his mental status does not allow for an adequate hx  AKI (acute kidney injury)/ CKD (chronic kidney disease) stage 3, GFR 30-59 ml/min - Cr slowly improving - follow   Acute respiratory failure - in setting of encephalopathy and seizures - resolved   Hypernatremia - improving with hydration - push oral intake and follow   Morbid obesity  Venous stasis ulcers - stable  Poor venous access - pt lost his remaining peripheral IV this morning - all attempts to obtain new access have failed - no essential IV meds currently - follow w/o IV today and re-attempt in AM   Code Status: FULL  Family Communication: spoke w/ son at bedside  Disposition Plan: cont SDU for  BiPAP QHS- PT eval  Consultants: Neurology  Antibiotics: Acyclovir 1/5>>>1/8   DVT prophylaxis: Heparin  Objective: Blood pressure 153/64, pulse 77, temperature 98.7 F (37.1 C), temperature source Oral, resp. rate 24, height 6' (1.829 m), weight 175 kg (385 lb 12.9 oz), SpO2 92.00%.  Intake/Output Summary (Last 24 hours) at 07/05/13 1454 Last data filed at 07/05/13 1403  Gross per 24 hour  Intake 2331.67 ml  Output      0 ml  Net 2331.67 ml    Exam: General: Sitting  up at bedside with help of physical therapy but very unsteady - no acute respiratory distress Lungs: Clear to auscultation bilaterally without wheezes or crackles but breath sounds are very distant Cardiovascular: Regular rate and rhythm without murmur gallop or rub - heart sounds are very distant Abdomen: Morbidly obese, nontender, nondistended, soft, bowel sounds positive, no rebound, no ascites, no appreciable mass Extremities: No significant cyanosis, clubbing, or edema bilateral lower extremities - venous statis ulcers dressed and dry  Data Reviewed: Basic Metabolic Panel:  Recent Labs Lab 06/29/13 0330 06/30/13 0500  07/02/13 0500 07/02/13 1250 07/03/13 0548 07/04/13 0245 07/05/13 0250  NA 161* 158*  < > 144 154* 151* 149* 149*  K 3.4* 2.9*  < > 3.1* 3.6* 3.4* 3.2* 4.2  CL 121* 121*  < > 107 115* 114* 111 114*  CO2 30 24  < > 25 27 27 25 20   GLUCOSE 169* 169*  < > 531* 189* 181* 211* 122*  BUN 29* 27*  < > 29* 30* 31* 28* 25*  CREATININE 1.52* 1.56*  < > 2.19* 2.35* 2.53* 2.25* 2.05*  CALCIUM 9.4 8.3*  < > 8.3* 8.7 8.5 8.7 8.6  MG 1.9 1.8  --   --   --   --   --   --   PHOS 3.1 3.5  --   --   --   --   --   --   < > = values in this interval not displayed.  Liver Function Tests: No results found for this basename: AST, ALT, ALKPHOS, BILITOT, PROT, ALBUMIN,  in the last 168 hours No results found for this basename: LIPASE, AMYLASE,  in the last 168 hours No results found for this basename: AMMONIA,  in the last 168 hours  CBC:  Recent Labs Lab 06/29/13 0330 06/30/13 0500 07/01/13 0218 07/04/13 0245  WBC 12.2* 9.2 9.8 8.2  NEUTROABS 8.3*  --   --   --   HGB 13.6 11.6* 13.3 11.6*  HCT 43.2 37.4* 42.7 36.1*  MCV 96.0 95.2 93.8 92.8  PLT 365 326 288 333   BNP (last 3 results)  Recent Labs  06/29/13 0330  PROBNP 1004.0*   CBG:  Recent Labs Lab 07/04/13 1637 07/04/13 2158 07/05/13 0826 07/05/13 1212 07/05/13 1217  GLUCAP 73 101* 143* 232* 217*     Recent Results (from the past 240 hour(s))  URINE CULTURE     Status: None   Collection Time    07/03/13  7:17 PM      Result Value Range Status   Specimen Description URINE, CLEAN CATCH   Final   Special Requests NONE   Final   Culture  Setup Time     Final   Value: 07/04/2013 05:11     Performed at [facility]     Final   Value: >=100,000 COLONIES/ML     Performed at [facility]  Final   Value: PROTEUS MIRABILIS     Performed at [facility]   Report Status PENDING   Incomplete     Studies:  Recent x-ray studies have been reviewed in detail by the Attending Physician  Scheduled Meds:  Scheduled Meds: . amLODipine  10 mg Oral Daily  . antiseptic oral rinse  15 mL Mouth Rinse q12n4p  . cefTRIAXone (ROCEPHIN)  IV  1 g Intravenous Q24H  . chlorhexidine  15 mL Mouth Rinse BID  . cloNIDine  0.3 mg Transdermal Weekly  . feeding supplement (ENSURE)  1 Container Oral TID BM  . heparin  5,000 Units Subcutaneous Q8H  . hydrALAZINE  25 mg Oral Q8H  . insulin aspart  0-20 Units Subcutaneous TID WC  . insulin glargine  15 Units Subcutaneous Daily  . labetalol  200 mg Oral BID  . pantoprazole  40 mg Oral Daily  . sodium chloride  10-40 mL Intracatheter Q12H    Time spent on care of this patient: 35 min   Shantai Tiedeman T, MD  [facility] Office  [phone] Pager - Text Page per Shea Evans as per below:  On-Call/Text Page:      [email or web address]      password [email or web address]  If 7PM-7AM, please contact night-coverage [email or web address] Password [email or web address] 07/05/2013, 2:54 PM   LOS: 14 days

## 2013-07-05 NOTE — Progress Notes (Addendum)
NUTRITION FOLLOW UP  Intervention:   Add Ensure Pudding po TID, each supplement provides 170 kcal and 4 grams of protein. If mental status continues to wax and wane, may need to consider PEG to provide pt with adequate nutrition considering pt safety. See most recent SLP rec's.  Nutrition Dx:   Inadequate oral intake related to inability to eat as evidenced by NPO status; ongoing.   Goal:  Pt to meet >/= 90% of their estimated nutrition needs, not met.   Monitor:  Diet advancement, PO intake, weight trend, labs   Assessment:   Pt admitted after being found locked in his car. Pt unresponsive and having seizures. 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   Pt self extubated 1/11. Pt pulled feeding tube 1/14.  Pt moves around in the bed and is unintentional about pulling lines. Per RN, pt fell over weekend. Now has a Actuary.  Dysphagia 1 Pudding Thick diet started 1/15 with minimal PO's consumed. SLP completed FEES 1/16, pt noted to have cognitive deficits that are significantly impacting his ability to safely consume any PO's. Pt with recommendations for NPO status, however pt has remained on Dysphagia 1 diet with Pudding Thickened Liquids since 1/15. Pt consumed 100% breakfast this morning. RN reports that pt's mental status continues to wax and wane. Sometimes takes quite a bit of time to administer PO meds.  Height: Ht Readings from Last 1 Encounters:  06/21/13 6' (1.829 m)    Weight Status:   Wt Readings from Last 1 Encounters:  07/02/13 385 lb 12.9 oz (175 kg)  Usual weight 392-395 lb (179.5 kg)  Re-estimated needs:  Kcal: 2100-2300 Protein: 120-140 grams Fluid: >2 L/day  Skin: BLE chronic full thickness wounds on calves   Diet Order: Dysphagia 1; Pudding Thickened Liquids   Intake/Output Summary (Last 24 hours) at 07/05/13 1123 Last data filed at 07/05/13 0900  Gross per 24 hour  Intake 2021.67 ml  Output       0 ml  Net 2021.67 ml    Last BM: 1/18  Labs:   Recent Labs Lab 06/29/13 0330 06/30/13 0500  07/03/13 0548 07/04/13 0245 07/05/13 0250  NA 161* 158*  < > 151* 149* 149*  K 3.4* 2.9*  < > 3.4* 3.2* 4.2  CL 121* 121*  < > 114* 111 114*  CO2 30 24  < > '27 25 20  ' BUN 29* 27*  < > 31* 28* 25*  CREATININE 1.52* 1.56*  < > 2.53* 2.25* 2.05*  CALCIUM 9.4 8.3*  < > 8.5 8.7 8.6  MG 1.9 1.8  --   --   --   --   PHOS 3.1 3.5  --   --   --   --   GLUCOSE 169* 169*  < > 181* 211* 122*  < > = values in this interval not displayed.  CBG (last 3)   Recent Labs  07/04/13 1637 07/04/13 2158 07/05/13 0826  GLUCAP 73 101* 143*   Lab Results  Component Value Date   HGBA1C 10.5* 07/04/2013   Scheduled Meds: . amLODipine  10 mg Oral Daily  . antiseptic oral rinse  15 mL Mouth Rinse q12n4p  . cefTRIAXone (ROCEPHIN)  IV  1 g Intravenous Q24H  . chlorhexidine  15 mL Mouth Rinse BID  . cloNIDine  0.3 mg Transdermal Weekly  . heparin  5,000 Units Subcutaneous Q8H  . hydrALAZINE  25 mg Oral Q8H  .  insulin aspart  0-20 Units Subcutaneous TID WC  . insulin glargine  15 Units Subcutaneous Daily  . labetalol  200 mg Oral BID  . pantoprazole  40 mg Oral Daily  . sodium chloride  10-40 mL Intracatheter Q12H    Continuous Infusions: . sodium chloride 100 mL/hr at 07/04/13 2000    Inda Coke MS, RD, LDN Pager: [phone] After-hours pager: [phone]

## 2013-07-05 NOTE — Progress Notes (Signed)
Rehab admissions - Evaluated for possible admission.  I am following for acute inpatient rehab.  Will contact family to discuss rehab options and will await medical stability.  Call me for questions.  #[phone]

## 2013-07-06 LAB — CBC
HEMATOCRIT: 36.5 % — AB (ref 39.0–52.0)
Hemoglobin: 11.6 g/dL — ABNORMAL LOW (ref 13.0–17.0)
MCH: 29.4 pg (ref 26.0–34.0)
MCHC: 31.8 g/dL (ref 30.0–36.0)
MCV: 92.4 fL (ref 78.0–100.0)
PLATELETS: 355 10*3/uL (ref 150–400)
RBC: 3.95 MIL/uL — ABNORMAL LOW (ref 4.22–5.81)
RDW: 17.2 % — AB (ref 11.5–15.5)
WBC: 7.6 10*3/uL (ref 4.0–10.5)

## 2013-07-06 LAB — URINE CULTURE: Colony Count: >=100000

## 2013-07-06 LAB — GLUCOSE, CAPILLARY
GLUCOSE-CAPILLARY: 194 mg/dL — AB (ref 70–99)
GLUCOSE-CAPILLARY: 308 mg/dL — AB (ref 70–99)
Glucose-Capillary: 168 mg/dL — ABNORMAL HIGH (ref 70–99)
Glucose-Capillary: 231 mg/dL — ABNORMAL HIGH (ref 70–99)

## 2013-07-06 LAB — BASIC METABOLIC PANEL
BUN: 25 mg/dL — ABNORMAL HIGH (ref 6–23)
CALCIUM: 8.7 mg/dL (ref 8.4–10.5)
CO2: 24 mEq/L (ref 19–32)
CREATININE: 2.1 mg/dL — AB (ref 0.50–1.35)
Chloride: 113 mEq/L — ABNORMAL HIGH (ref 96–112)
GFR calc Af Amer: 37 mL/min — ABNORMAL LOW (ref >90)
GFR calc non Af Amer: 32 mL/min — ABNORMAL LOW (ref >90)
GLUCOSE: 207 mg/dL — AB (ref 70–99)
Potassium: 3.5 mEq/L — ABNORMAL LOW (ref 3.7–5.3)
Sodium: 149 mEq/L — ABNORMAL HIGH (ref 137–147)

## 2013-07-06 MED ORDER — DEXTROSE 5 % IV SOLN
1.0000 g | INTRAVENOUS | Status: DC
  Filled 2013-07-06: qty 10

## 2013-07-06 MED ORDER — LEVOFLOXACIN 250 MG PO TABS
250.0000 mg | ORAL_TABLET | Freq: Every day | ORAL | Status: DC
  Administered 2013-07-06 – 2013-07-08 (×3): 250 mg via ORAL
  Filled 2013-07-06 (×3): qty 1

## 2013-07-06 MED ORDER — INSULIN ASPART 100 UNIT/ML ~~LOC~~ SOLN
4.0000 [IU] | Freq: Three times a day (TID) | SUBCUTANEOUS | Status: DC
  Administered 2013-07-07 (×2): 4 [IU] via SUBCUTANEOUS

## 2013-07-06 NOTE — Progress Notes (Signed)
RT placed patient on bipap 16/8, RR 10, Fi02 30%. Patient is tolerating bipap well at this time. RT will continue to monitor.

## 2013-07-06 NOTE — Progress Notes (Signed)
Speech Language Pathology Treatment: Dysphagia  Patient Details Name: Blake Bell MRN: [id number] DOB: January 02, 1951 Today's Date: 07/06/2013 Time: 2355-7322 SLP Time Calculation (min): 15 min  Assessment / Plan / Recommendation Clinical Impression  F/u for dysphagia.  Pt evaluated via FEES Fri, 1/16 with subsequent recommendations for NPO.  Orders not written, and pt has been eating a dysphagia 1 diet with pudding thick liquids since that time.  However, he appears to have been tolerating this diet with no adverse effects: his lungs are diminished but clear; his RR is in the mid-20s (improved compatibility with swallowing;) he is maintaining SP02 >95% on room air; and his mentation has improved.  During today's session, he consumed a container of Ensure pudding with improved clinical symptoms - there were continued delays in oral prep and in swallow initiation, but no overt s/s of aspiration.  Pt with impaired awareness, including inability to recognize when he has swallowed, and remains a high aspiration risk.  Given his toleration the last several days, recommend continuing this conservative diet with full supervision and removal of tray if coughing ensues.  SLP will follow for improvements, readiness for diet advancement.   HPI HPI: 63 yo male with altered mental status and seizures. Intubated for airway protection. BP in ER 214/148. PCCM asked to admit to ICU. Self extubated 06/27/13. Requires 6L O2 to maintain oxygenation. Very lethargic since extubation and difficult to arouse/assess. Follows some commands for nursing. On and off BiPAP every 4 hours. Pt needs to take oral meds per MD.      SLP Plan  Goals updated ; see care plan   Recommendations Diet recommendations: Dysphagia 1 (puree);Pudding-thick liquid Liquids provided via: Teaspoon Medication Administration: Crushed with puree Supervision: Staff to assist with self feeding Compensations: Slow rate;Small sips/bites;Check for  pocketing;Clear throat intermittently Postural Changes and/or Swallow Maneuvers: Seated upright 90 degrees;Upright 30-60 min after meal              Oral Care Recommendations: Oral care BID Plan: Goals updated      Blake Bell, [place] CCC/SLP Pager [phone]  [name] 07/06/2013, 3:03 PM

## 2013-07-06 NOTE — Progress Notes (Signed)
Inpatient Diabetes Program Recommendations  AACE/ADA: New Consensus Statement on Inpatient Glycemic Control (2013)  Target Ranges:  Prepandial:   less than 140 mg/dL      Peak postprandial:   less than 180 mg/dL (1-2 hours)      Critically ill patients:  140 - 180 mg/dL   Reason for Visit: Results for KODAH, MARET (MRN [id number]) as of 07/06/2013 10:08  Ref. Range 07/05/2013 12:12 07/05/2013 12:17 07/05/2013 17:10 07/05/2013 22:10 07/06/2013 08:12  Glucose-Capillary Latest Range: 70-99 mg/dL 232 (H) 217 (H) 240 (H) 229 (H) 168 (H)   Consider adding Novolog meal coverage 4 units tid with meals (to cover CHO intake).  Hold if patient eats less than 50%.  Thanks, Adah Perl, RN, BC-ADM Inpatient Diabetes Coordinator Pager [phone]

## 2013-07-06 NOTE — Progress Notes (Signed)
Physical Therapy Treatment Patient Details Name: Blake Bell MRN: [id number] DOB: 27-Feb-1951 Today's Date: 07/06/2013 Time: 1478-2956 PT Time Calculation (min): 16 min  PT Assessment / Plan / Recommendation  History of Present Illness  63 yo male with altered mental status and seizures. Intubated for airway protection. BP in ER 214/148. PCCM asked to admit to ICU. Self extubated 06/27/13. Requires 6L O2 to maintain oxygenation. Very lethargic since extubation and difficult to arouse/assess.    PT Comments   Pt with significant improvement in standing tolerance but remains to have R UE weakness and cognitive deficits. Pt con't to be excellent candidate for CIR once medically stable.   Follow Up Recommendations  CIR     Does the patient have the potential to tolerate intense rehabilitation     Barriers to Discharge        Equipment Recommendations       Recommendations for Other Services    Frequency Min 4X/week   Progress towards PT Goals Progress towards PT goals: Progressing toward goals  Plan Current plan remains appropriate    Precautions / Restrictions Precautions Precautions: Fall Precaution Comments: Pt 385 pounds Restrictions Weight Bearing Restrictions: No   Pertinent Vitals/Pain stable    Mobility  Bed Mobility Overal bed mobility: +2 for physical assistance Bed Mobility: Supine to Sit;Sit to Supine Rolling: +2 for physical assistance;Min assist Supine to sit: +2 for physical assistance;Min assist Sit to supine: +2 for physical assistance;Max assist General bed mobility comments:  (Much improved from yesterday) Transfers Overall transfer level: Needs assistance Transfers: Sit to/from Stand Sit to Stand: +2 physical assistance;Min assist;Mod assist General transfer comment: completed 3 trials. Able to stand initially x 2 minutes with upright posture.. Able to correct with vc only. As pt fatigues, standing tolerance decreases.        PT Diagnosis:     PT Problem List:   PT Treatment Interventions:     PT Goals (current goals can now be found in the care plan section) Acute Rehab PT Goals Patient Stated Goal: get stronger  Visit Information  Last PT Received On: 07/06/13 Assistance Needed: +3 or more Reason for Co-Treatment: Complexity of the patient's impairments (multi-system involvement);For patient/therapist safety PT goals addressed during session: Mobility/safety with mobility History of Present Illness:  63 yo male with altered mental status and seizures. Intubated for airway protection. BP in ER 214/148. PCCM asked to admit to ICU. Self extubated 06/27/13. Requires 6L O2 to maintain oxygenation. Very lethargic since extubation and difficult to arouse/assess.     Subjective Data  Subjective: pt received sitting EOB with OT Patient Stated Goal: get stronger   Cognition  Cognition Arousal/Alertness: Awake/alert Behavior During Therapy: Flat affect Overall Cognitive Status: Impaired/Different from baseline Area of Impairment: Attention;Memory;Orientation;Following commands;Safety/judgement;Awareness;Problem solving Orientation Level: Disoriented to;Time Current Attention Level: Sustained Memory: Decreased recall of precautions;Decreased short-term memory Following Commands: Follows one step commands with increased time Safety/Judgement: Decreased awareness of safety;Decreased awareness of deficits Awareness: Emergent Problem Solving: Slow processing;Decreased initiation;Difficulty sequencing;Requires verbal cues;Requires tactile cues General Comments: perseverating on wanting to sit in small chairs with wheels    Balance  Balance Overall balance assessment: Needs assistance Sitting-balance support: Feet supported;Single extremity supported Sitting balance-Leahy Scale: Fair Sitting balance - Comments: no posterior lean today Postural control: Posterior lean Standing balance support: Bilateral upper extremity  supported Standing balance-Leahy Scale: Poor (Able to begin to paritcipate in laterla weight shifts) Standing balance comment: worked on side stepping and increasing R LE WBing. pt did require R  blocking of knee when advancing L LE.  End of Session PT - End of Session Equipment Utilized During Treatment: Gait belt;Oxygen;Other (comment) Activity Tolerance: Patient tolerated treatment well Patient left: in bed;with call bell/phone within reach;with family/visitor present;with nursing/sitter in room Nurse Communication: Mobility status;Need for lift equipment   GP     [name] 07/06/2013, 1:53 PM  [name], PT, DPT Pager #: [phone] Office #: [phone]

## 2013-07-06 NOTE — Progress Notes (Signed)
Rehab admissions - I spoke with patient's son, Junior, today.  Son says family all work and he feels rehab should be in a SNF.  Wife works.  Sons says he does not want the burden of caring for patient to be on his mom.  Patient has [employer] carrier.  I have called case manager to update her.  Call me for questions.  #[phone]

## 2013-07-06 NOTE — Progress Notes (Signed)
Utilization review completed.  

## 2013-07-06 NOTE — Progress Notes (Signed)
OT EVALUATION  Clinical Impression: Admitted with DKA. Recent scan ? Brain mass. PTA,  Pt was independent with ADL and mobility and worked part time. Pt with significant functional decline. Feel pt is excellent candidate for CIR. Decreased normal movement patterns RUE. Will discuss with MD. Pt will benefit from skilled OT services to facilitate D/C to next venue due to below deficits. Very supportive family who agree with CIR.    07/05/13 1533  OT Visit Information  Last OT Received On 07/05/13  Assistance Needed +3 or more  PT/OT/SLP Co-Evaluation/Treatment Yes  Reason for Co-Treatment Complexity of the patient's impairments (multi-system involvement);For patient/therapist safety (Pt 385#)  Home Living  Family/patient expects to be discharged to: Inpatient rehab  Prior Function  Level of Independence Independent  Comments worked. welder  Engineer, [employer] Expressive difficulties  Cognition  Arousal/Alertness Awake/alert  Behavior During Therapy Flat affect  Overall Cognitive Status Impaired/Different from baseline  Area of Impairment Orientation;Attention;Memory;Following commands;Safety/judgement;Awareness;Problem solving  Orientation Level Disoriented to;Time;Situation  Current Attention Level Sustained  Memory Decreased short-term memory  Following Commands Follows one step commands with increased time  Safety/Judgement Decreased awareness of safety;Decreased awareness of deficits  Awareness Intellectual  Problem Solving Slow processing;Difficulty sequencing;Requires verbal cues;Requires tactile cues  Upper Extremity Assessment  Upper Extremity Assessment RUE deficits/detail;LUE deficits/detail  RUE Deficits / Details significantly weaker than L. gross motor moements. alb eot complete @ 30 FF. Able to touch hand to mouth with leaning forward. diffficulty pisking up bojects.  RUE Coordination decreased fine motor;decreased gross motor  LUE Deficits / Details  generalized weakness  Lower Extremity Assessment  Lower Extremity Assessment Defer to PT evaluation  Cervical / Trunk Assessment  Cervical / Trunk Assessment Other exceptions (poor postural contol)  ADL  Grooming Maximal assistance  Where Assessed - Grooming Supported sitting  Upper Body Bathing Maximal assistance  Where Assessed - Upper Body Bathing Supported sitting  Lower Body Bathing +2 Total assistance  Where Assessed - Lower Body Bathing Rolling right and/or left  Upper Body Dressing Maximal assistance  Where Assessed - Upper Body Dressing Supported sitting  Lower Body Dressing +2 Total assistance  Where Assessed - Lower Body Dressing Rolling right and/or left  Toilet Transfer Simulated;Other (comment) (+4 sit - stand)  Equipment Used Other (comment) (gait belt made from sheet due to size)  Transfers/Ambulation Related to ADLs +4   ADL Comments significant decline in funcitonal status  Vision - History  Baseline Vision No visual deficits  Patient Visual Report No change from baseline  Vision - Assessment  Eye Alignment [facility]  Vision Assessment Vision not tested  Additional Comments will further assess  Perception  Perception Not tested  Praxis  Praxis Impaired  Praxis Impairment Details Perseveration  Bed Mobility  Overal bed mobility +2 for physical assistance;Needs Assistance  Bed Mobility Supine to Sit;Sit to Supine;Rolling  Rolling Max assist  Supine to sit +2 for physical assistance;Total assist  Sit to supine +2 for physical assistance;Total assist  General bed mobility comments max directional v/c's. assist for trunk elevation and to bring LEs off EOB  Transfers  Overall transfer level Needs assistance  Transfers Sit to/from Stand  Sit to Stand +2 physical assistance;Max assist  General transfer comment +4 used for safety  Balance  Overall balance assessment Needs assistance  Sitting-balance support Feet supported;Bilateral upper extremity supported   Sitting balance-Leahy Scale Fair  Sitting balance - Comments pt sat EOB x 15, pt with posterior bias  Postural control Posterior lean  Standing  balance support Bilateral upper extremity supported  Standing balance-Leahy Scale Poor  Standing balance comment r bias  Exercises  Exercises Other exercises  Other Exercises  Other Exercises table slides BUE.   Other Exercises elbow flex/ext  Other Exercises encouraged pt/son to complete BUE and LE AROM @ bed level  OT - End of Session  Equipment Utilized During Treatment Gait belt  Activity Tolerance Patient tolerated treatment well  Patient left in bed;with call bell/phone within reach;with nursing/sitter in room  Nurse Communication Mobility status;Need for lift equipment  OT Assessment  OT Recommendation/Assessment Patient needs continued OT Services  OT Problem List Decreased strength;Decreased range of motion;Decreased activity tolerance;Impaired balance (sitting and/or standing);Decreased coordination;Decreased cognition;Decreased safety awareness;Decreased knowledge of use of DME or AE;Cardiopulmonary status limiting activity;Decreased knowledge of precautions;Obesity;Impaired UE functional use;Pain;Increased edema  OT Therapy Diagnosis  Generalized weakness;Cognitive deficits;Acute pain;Altered mental status  OT Plan  OT Frequency Min 3X/week  OT Treatment/Interventions Self-care/ADL training;Therapeutic exercise;Neuromuscular education;Energy conservation;DME and/or AE instruction;Therapeutic activities;Cognitive remediation/compensation;Visual/perceptual remediation/compensation;Patient/family education;Balance training  OT Recommendation  Recommendations for Other Services Rehab consult  Follow Up Recommendations CIR  OT Equipment 3 in 1 bedside comode;Tub/shower bench  Individuals Consulted  Consulted and Agree with Results and Recommendations Patient;Family member/caregiver  Family Member Consulted son  Acute Rehab OT Goals   Patient Stated Goal get stronger  OT Goal Formulation With patient  Time For Goal Achievement 07/20/13  Potential to Achieve Goals Good  OT Time Calculation  OT Start Time 1500  OT Stop Time 1533  OT Time Calculation (min) 33 min  Written Expression  Dominant Hand Right  [facility], OTR/L  [phone] 07/05/2013

## 2013-07-06 NOTE — Progress Notes (Signed)
Progress Note Blake Bell - Stepdown/ICU TEAM   Blake Bell JIR:[id number] DOB: 11/04/1950 DOA: Bell/10/2013 PCP: Blake Bell., MD  Brief narrative: 63 y/o male with h/o HTN, DM, OSA, CKD admitted for seizures and intubated in ER.   SIGNIFICANT EVENTS:  Bell/5 Admit, Neurology consulted  Bell/6 Off insulin gtt  Bell/7 Possible aspiration of tube feeds  Bell/11 Self extubated, put on BiPAP  Bell/12 on nicardipine  Bell/13 WBC trending up, BIPAP improving  Bell/14 self dc'ed panda, 4hr on/4hrs off BiPAP  Bell/15 Diet initiated, PICC replaced  STUDIES:  Bell/5 CT head >> mild atrophy with patchy small vessel disease, ?aneurysm Rt superior to sella  Bell/5 CT angio head >> no aneurysm, 13 mm mass Rt sella/suprasellar region  Bell/6 EEG >> normal sleep EEG  Bell/8 Echo >> mild LVH, EF 55 to 60%, mod LA dilation  Bell/12 CXR >> Slight improvement in congestive heart failure/interstitial edema  Bell/14 CXR>> Stable cardiomegaly, minimal interstitial pulmonary edema.  CXR Bell/15 PM >> Continued Pulm edema.   LINES / TUBES:  Bell/5 ETT>>> Bell/11  Bell/7 Rt PICC>>> exchanged over guidewire Bell/15   CULTURES:  Bell/5 BC>>> Neg  Bell/5 MRSA >> positive   Subjective: Patient is oriented but drowsy. Discussed need to wear BiPAP at night as he refused it last night- states he understands the need for it and will be compliant with it   Assessment/Plan:  Seizure - suspected to be due to malignant HTN and DKA and possibly PRESS however due to patient size, unable to obtain MRI to confirm PRESS - no AED recommended by Neurology at this time  Malignant hypertension - Blood pressure much better controlled- continue current medication without change  Altered mental status / acute hypertensive encephalopathy - very restless still and confused- needing a sitter (at times not available) and restraints - - trying to keep him awake during the day to allow him to take his meds and food - has pulled out his IV again- will d/c IV Haldol  (concern about QT prolongation as we are also starting Levaquin today) - has not needed Haldol since Bell/18- taking orals properly  DKA - DM (diabetes mellitus), type 2 - now resolved- does not appear to be on home meds for DM - HBa1c 10.5 - Adjust insulin dosing and CBG is climbing  Hypokalemia - Has normalized   Proteus UTI - d/c Ceftin - will start oral Levaquin   OSA/ OHS - will need BiPAP QHS- previously on CPAP- refused to weak BiPAP last night- I have discussed it with him and encourage it  Bell.3cm pituitary prolactinoma  - 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 per radiology read - will need MRI dedicated to the sella to better characterize - will need to wait until he is less agitated and more cooperative to perform the MRI - prolactin levels severely elevated w/ markedly suppressed LH and FSH - will initiate cabergoline given size of mass as well as prolactin level - unable to screen pt for sx as his mental status does not allow for an adequate hx  AKI (acute kidney injury)/ CKD (chronic kidney disease) stage 3, GFR 30-59 ml/min - Cr slowly improving - follow   Acute respiratory failure - in setting of encephalopathy and seizures - resolved   Hypernatremia - improving with hydration - push oral intake and follow   Morbid obesity  Venous stasis ulcers - stable  Poor venous access - pt lost his remaining peripheral IV Bell/19- all attempts  to obtain new access have failed - no essential IV meds currently -  Code Status: FULL  Family Communication: Dr Dorothy Puffer spoke w/ son at bedside on Bell/19 Disposition Plan: cont SDU for BiPAP QHS- CIR eval  Consultants: Neurology  Antibiotics: Acyclovir Bell/5>>>Bell/8   DVT prophylaxis: Heparin  Objective: Blood pressure 147/75, pulse 80, temperature 98.2 F (36.8 C), temperature source Oral, resp. rate 17, height 6' (Bell.829 m), weight 175 kg (385 lb 12.9 oz), SpO2 95.00%.  Intake/Output Summary (Last 24 hours) at  07/06/13 1738 Last data filed at 07/06/13 0300  Gross per 24 hour  Intake    720 ml  Output      0 ml  Net    720 ml    Exam: General: laying in bed - no acute respiratory distress Lungs: Clear to auscultation bilaterally without wheezes or crackles but breath sounds are very distant Cardiovascular: Regular rate and rhythm without murmur gallop or rub - heart sounds are very distant Abdomen: Morbidly obese, nontender, nondistended, soft, bowel sounds positive, no rebound, no ascites, no appreciable mass Extremities: No significant cyanosis, clubbing, or edema bilateral lower extremities - venous statis ulcers dressed and dry  Data Reviewed: Basic Metabolic Panel:  Recent Labs Lab 06/30/13 0500  07/02/13 1250 07/03/13 0548 07/04/13 0245 07/05/13 0250 07/06/13 0305  NA 158*  < > 154* 151* 149* 149* 149*  K 2.9*  < > 3.6* 3.4* 3.2* 4.2 3.5*  CL 121*  < > 115* 114* 111 114* 113*  CO2 24  < > 27 27 25 20 24   GLUCOSE 169*  < > 189* 181* 211* 122* 207*  BUN 27*  < > 30* 31* 28* 25* 25*  CREATININE Bell.56*  < > 2.35* 2.53* 2.25* 2.05* 2.10*  CALCIUM 8.3*  < > 8.7 8.5 8.7 8.6 8.7  MG Bell.8  --   --   --   --   --   --   PHOS 3.5  --   --   --   --   --   --   < > = values in this interval not displayed.  Liver Function Tests: No results found for this basename: AST, ALT, ALKPHOS, BILITOT, PROT, ALBUMIN,  in the last 168 hours No results found for this basename: LIPASE, AMYLASE,  in the last 168 hours No results found for this basename: AMMONIA,  in the last 168 hours  CBC:  Recent Labs Lab 06/30/13 0500 07/01/13 0218 07/04/13 0245 07/06/13 0305  WBC 9.2 9.8 8.2 7.6  HGB 11.6* 13.3 11.6* 11.6*  HCT 37.4* 42.7 36.Bell* 36.5*  MCV 95.2 93.8 92.8 92.4  PLT 326 288 333 355   BNP (last 3 results)  Recent Labs  06/29/13 0330  PROBNP 1004.0*   CBG:  Recent Labs Lab 07/05/13 1710 07/05/13 2210 07/06/13 0812 07/06/13 1211 07/06/13 1623  GLUCAP 240* 229* 168* 231* 194*      Recent Results (from the past 240 hour(s))  URINE CULTURE     Status: None   Collection Time    07/03/13  7:17 PM      Result Value Range Status   Specimen Description URINE, CLEAN CATCH   Final   Special Requests NONE   Final   Culture  Setup Time     Final   Value: 07/04/2013 05:11     Performed at [facility]     Final   Value: >=100,000 COLONIES/ML     Performed  at [facility]     Final   Value: PROTEUS MIRABILIS     Performed at [facility]   Report Status 07/06/2013 FINAL   Final   Organism ID, Bacteria PROTEUS MIRABILIS   Final     Studies:  Recent x-ray studies have been reviewed in detail by the Attending Physician  Scheduled Meds:  Scheduled Meds: . amLODipine  10 mg Oral Daily  . antiseptic oral rinse  15 mL Mouth Rinse q12n4p  . cabergoline  0.25 mg Oral 2 times weekly  . chlorhexidine  15 mL Mouth Rinse BID  . cloNIDine  0.3 mg Transdermal Weekly  . feeding supplement (ENSURE)  Bell Container Oral TID BM  . heparin  5,000 Units Subcutaneous Q8H  . hydrALAZINE  25 mg Oral Q8H  . insulin aspart  0-20 Units Subcutaneous TID WC  . insulin glargine  20 Units Subcutaneous Daily  . labetalol  200 mg Oral BID  . levofloxacin  250 mg Oral Daily  . pantoprazole  40 mg Oral Daily    Time spent on care of this patient: 57 min   Debbe Odea, MD  [facility] Office  [phone] Pager - Text Page per Shea Evans as per below:  On-Call/Text Page:      [email or web address]      password [email or web address]  If 7PM-7AM, please contact night-coverage [email or web address] Password [facility] Bell/20/2015, 5:38 PM   LOS: 15 days

## 2013-07-06 NOTE — Progress Notes (Signed)
Occupational Therapy Treatment Patient Details Name: Blake Bell MRN: [id number] DOB: 03-May-1951 Today's Date: 07/06/2013 Time: 9326-7124 OT Time Calculation (min): 51 min  OT Assessment / Plan / Recommendation  History of present illness  63 yo male with altered mental status and seizures. Intubated for airway protection. BP in ER 214/148. PCCM asked to admit to ICU. Self extubated 06/27/13. Requires 6L O2 to maintain oxygenation. Very lethargic since extubation and difficult to arouse/assess.    OT comments  Excellent participation today. Improved standing tolerance. Min A +2 to complete sit - stand transfer. Pt with dignificant cognitive deficits. RUE focal weakness. Called portable equipment and having bariactric BSC and recliner delivered to room. Pt will be appropriate for CIR latter part of week if medically stable.   Follow Up Recommendations  CIR    Barriers to Discharge       Equipment Recommendations  3 in 1 bedside comode;Tub/shower bench    Recommendations for Other Services Rehab consult  Frequency Min 3X/week   Progress towards OT Goals Progress towards OT goals: Progressing toward goals  Plan Discharge plan remains appropriate    Precautions / Restrictions Precautions Precautions: Fall Precaution Comments: Pt 385 pounds   Pertinent Vitals/Pain no apparent distress     ADL  Grooming: Moderate assistance;Teeth care Where Assessed - Grooming: Supported sitting Transfers/Ambulation Related to ADLs: +3 ADL Comments: Trying to brush teeth with Rhand - difficulty due to weakness and decreased coordination    OT Diagnosis:    OT Problem List:   OT Treatment Interventions:     OT Goals(current goals can now be found in the care plan section) Acute Rehab OT Goals Patient Stated Goal: get stronger OT Goal Formulation: With patient Time For Goal Achievement: 07/20/13 Potential to Achieve Goals: Good ADL Goals Pt Will Perform Eating: with set-up;with  supervision;sitting Pt Will Perform Grooming: with min assist;sitting Pt Will Perform Upper Body Bathing: with min assist;sitting Pt Will Transfer to Toilet: with mod assist;bedside commode;stand pivot transfer Additional ADL Goal #1: Maintain sitting balance EOB with SBA for 20 min  for ADL  Visit Information  Last OT Received On: 07/06/13 Assistance Needed: +3 or more Reason for Co-Treatment: Complexity of the patient's impairments (multi-system involvement);For patient/therapist safety History of Present Illness:  63 yo male with altered mental status and seizures. Intubated for airway protection. BP in ER 214/148. PCCM asked to admit to ICU. Self extubated 06/27/13. Requires 6L O2 to maintain oxygenation. Very lethargic since extubation and difficult to arouse/assess.     Subjective Data      Prior Functioning       Cognition  Cognition Arousal/Alertness: Awake/alert Behavior During Therapy: Flat affect Overall Cognitive Status: Impaired/Different from baseline Area of Impairment: Attention;Memory;Orientation;Following commands;Safety/judgement;Awareness;Problem solving Orientation Level: Disoriented to;Time Current Attention Level: Sustained Memory: Decreased recall of precautions;Decreased short-term memory Following Commands: Follows one step commands with increased time Safety/Judgement: Decreased awareness of safety;Decreased awareness of deficits Awareness: Emergent Problem Solving: Slow processing;Decreased initiation;Difficulty sequencing;Requires verbal cues;Requires tactile cues General Comments: perseverating on wanting to sit in small chairs with wheels    Mobility  Bed Mobility Overal bed mobility: +2 for physical assistance Bed Mobility: Supine to Sit;Sit to Supine Rolling: +2 for physical assistance;Min assist Supine to sit: +2 for physical assistance;Min assist Sit to supine: +2 for physical assistance;Max assist General bed mobility comments:  (Much  improved from yesterday) Transfers Overall transfer level: Needs assistance Transfers: Sit to/from Stand Sit to Stand: +2 physical assistance;Min assist;Mod assist General transfer comment: completed  3 trials. Able to stand initially x 2 minutes with upright posture.. Able to correct with vc only. As pt fatigues, standing tolerance decreases.    Exercises  Other Exercises Other Exercises: table slides BUE.  Other Exercises: elbow flex/ext Other Exercises: B shoulder AROM i supine. PNF patterns.   Balance Balance Overall balance assessment: Needs assistance Sitting-balance support: Feet supported;Single extremity supported Sitting balance-Leahy Scale: Fair Sitting balance - Comments: no posterior lean today Standing balance support: Bilateral upper extremity supported Standing balance-Leahy Scale: Poor (Able to begin to paritcipate in laterla weight shifts)  End of Session OT - End of Session Equipment Utilized During Treatment: Gait belt Activity Tolerance: Patient tolerated treatment well Patient left: in bed;with call bell/phone within reach;with nursing/sitter in room Nurse Communication: Mobility status;Need for lift equipment  GO     Kristi Hyer,HILLARY 07/06/2013, 1:50 PM [facility], OTR/L  [phone] 07/06/2013

## 2013-07-07 LAB — BASIC METABOLIC PANEL
BUN: 21 mg/dL (ref 6–23)
CALCIUM: 8.9 mg/dL (ref 8.4–10.5)
CHLORIDE: 113 meq/L — AB (ref 96–112)
CO2: 21 mEq/L (ref 19–32)
Creatinine, Ser: 2 mg/dL — ABNORMAL HIGH (ref 0.50–1.35)
GFR calc Af Amer: 39 mL/min — ABNORMAL LOW (ref >90)
GFR calc non Af Amer: 34 mL/min — ABNORMAL LOW (ref >90)
GLUCOSE: 198 mg/dL — AB (ref 70–99)
Potassium: 3.8 mEq/L (ref 3.7–5.3)
SODIUM: 149 meq/L — AB (ref 137–147)

## 2013-07-07 LAB — GLUCOSE, CAPILLARY
GLUCOSE-CAPILLARY: 286 mg/dL — AB (ref 70–99)
Glucose-Capillary: 165 mg/dL — ABNORMAL HIGH (ref 70–99)
Glucose-Capillary: 184 mg/dL — ABNORMAL HIGH (ref 70–99)

## 2013-07-07 MED ORDER — HYDRALAZINE HCL 50 MG PO TABS
50.0000 mg | ORAL_TABLET | Freq: Three times a day (TID) | ORAL | Status: DC
  Administered 2013-07-07 – 2013-07-09 (×5): 50 mg via ORAL
  Filled 2013-07-07 (×8): qty 1

## 2013-07-07 MED ORDER — LORAZEPAM 2 MG/ML IJ SOLN
0.5000 mg | INTRAMUSCULAR | Status: AC | PRN
  Administered 2013-07-07 – 2013-07-08 (×2): 0.5 mg via INTRAMUSCULAR
  Filled 2013-07-07 (×2): qty 1

## 2013-07-07 MED ORDER — CLONIDINE HCL 0.1 MG PO TABS
0.1000 mg | ORAL_TABLET | ORAL | Status: AC
  Administered 2013-07-07: 0.1 mg via ORAL
  Filled 2013-07-07: qty 1

## 2013-07-07 MED ORDER — INSULIN GLARGINE 100 UNIT/ML ~~LOC~~ SOLN
24.0000 [IU] | Freq: Every day | SUBCUTANEOUS | Status: DC
  Administered 2013-07-08 – 2013-07-09 (×2): 24 [IU] via SUBCUTANEOUS
  Filled 2013-07-07 (×3): qty 0.24

## 2013-07-07 MED ORDER — INSULIN ASPART 100 UNIT/ML ~~LOC~~ SOLN
6.0000 [IU] | Freq: Three times a day (TID) | SUBCUTANEOUS | Status: DC
  Administered 2013-07-07 – 2013-07-13 (×18): 6 [IU] via SUBCUTANEOUS

## 2013-07-07 MED ORDER — CLONIDINE HCL 0.2 MG PO TABS
0.2000 mg | ORAL_TABLET | Freq: Three times a day (TID) | ORAL | Status: DC
  Administered 2013-07-07 – 2013-07-08 (×5): 0.2 mg via ORAL
  Filled 2013-07-07 (×10): qty 1

## 2013-07-07 NOTE — Clinical Social Work Note (Signed)
CSW attempted to assess. No family at bedside, and CSW left messages for wife and son. CSW waiting for callback.  Liz Beach, [place], [place], [phone]

## 2013-07-07 NOTE — Progress Notes (Signed)
Progress Note [name] TEAM 1 - Stepdown/ICU TEAM   Blake Bell [id number] DOB: 19-May-1951 DOA: 06/21/2013 PCP: [name], MD  Brief narrative: 63 y/o male with h/o HTN, DM, OSA, CKD admitted for seizures and intubated in ER.   SIGNIFICANT EVENTS:  1/5 Admit, Neurology consulted  1/6 Off insulin gtt  1/7 Possible aspiration of tube feeds  1/11 Self extubated, put on BiPAP  1/12 on nicardipine  1/13 WBC trending up, BIPAP improving  1/14 self dc'ed panda, 4hr on/4hrs off BiPAP  1/15 Diet initiated, PICC replaced  STUDIES:  1/5 CT head >> mild atrophy with patchy small vessel disease, ?aneurysm Rt superior to sella  1/5 CT angio head >> no aneurysm, 13 mm mass Rt sella/suprasellar region  1/6 EEG >> normal sleep EEG  1/8 Echo >> mild LVH, EF 55 to 60%, mod LA dilation  1/12 CXR >> Slight improvement in congestive heart failure/interstitial edema  1/14 CXR>> Stable cardiomegaly, minimal interstitial pulmonary edema.  CXR 1/15 PM >> Continued Pulm edema.   LINES / TUBES:  1/5 ETT>>> 1/11  1/7 Rt PICC>>> exchanged over guidewire 1/15   CULTURES:  1/5 BC>>> Neg  1/5 MRSA >> positive   Subjective: Much more alert and interactive today compared to 1/19.  Denies cp, n/v, abdom pain, or sob.  Speech much more clear.    Assessment/Plan:  Seizure - suspected to be due to malignant HTN and DKA and possibly PRESS however due to patient size unable to obtain MRI to confirm PRESS - no AED recommended by Neurology at this time  Malignant hypertension - Blood pressure much better controlled, but remains elevated above goal - adjust tx regimen - follow trend   Altered mental status / acute hypertensive encephalopathy - slowly but steadily improving - avoid sedating meds - work to restore sleep/wake cycle   DKA - DM (diabetes mellitus), type 2 - DKA now resolved - was on oral meds only prior to admit  - HgA1c 10.5 - adjust tx gently today for improved control    Hypokalemia - Has normalized   Proteus UTI - amp + cefazolin resistant  - oral Levaquin  OSA/ OHS - pt now agrees to wear his home CPAP QHS - will accept this over not wearing anything   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  - 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 per radiology read - will need MRI dedicated to the sella to better characterize once d/c and able to have "open" MRI as outpt - reportedly pt is unable to fit in our MRI at [facility]  - prolactin levels severely elevated w/ markedly suppressed LH and FSH - initiated cabergoline given size of mass as well as prolactin level  AKI (acute kidney injury) / CKD (chronic kidney disease) stage 3, GFR 30-59 ml/min - Cr slowly improving - follow   Acute respiratory failure - in setting of encephalopathy and seizures - resolved   Hypernatremia - was improving with hydration - has stalled due to poor oral intake/following orders and lack of IV - encourage oral intake  Morbid obesity  Venous stasis ulcers - stable  Poor venous access - pt lost his remaining peripheral IV 1/19 - all attempts to obtain new access have failed - no essential IV meds currently - push po liquids and follow   Code Status: FULL  Family Communication: spoke w/ pt, wife, and son at bedside Disposition Plan: transfer to medical bed - hopeful for CIR or SNF in next 48-72hrs if BP stabilizes  and Na improves   Consultants: Neurology  Antibiotics: Acyclovir 1/5>>>1/8 Rocephin 1/18 Cefuroxime 1/19 > 1/20 Levaquin 1/20 >>   DVT prophylaxis: SQ Heparin  Objective: Blood pressure 178/97, pulse 77, temperature 98.1 F (36.7 C), temperature source Oral, resp. rate 23, height 6' (1.829 m), weight 175 kg (385 lb 12.9 oz), SpO2 97.00%.  Intake/Output Summary (Last 24 hours) at 07/07/13 1424 Last data filed at 07/07/13 1113  Gross per 24 hour  Intake    120 ml  Output    225 ml  Net   -105 ml   Exam: General: sitting up in bedside chair - no  acute respiratory distress Lungs: Clear to auscultation bilaterally without wheezes or crackles but breath sounds are very distant Cardiovascular: Regular rate and rhythm without murmur gallop or rub - heart sounds are very distant Abdomen: Morbidly obese, nontender, nondistended, soft, bowel sounds positive, no rebound, no ascites, no appreciable mass Extremities: No significant cyanosis, clubbing;  1+ edema bilateral lower extremities - venous statis ulcers dressed and dry  Data Reviewed: Basic Metabolic Panel:  Recent Labs Lab 07/03/13 0548 07/04/13 0245 07/05/13 0250 07/06/13 0305 07/07/13 0357  NA 151* 149* 149* 149* 149*  K 3.4* 3.2* 4.2 3.5* 3.8  CL 114* 111 114* 113* 113*  CO2 27 25 20 24 21   GLUCOSE 181* 211* 122* 207* 198*  BUN 31* 28* 25* 25* 21  CREATININE 2.53* 2.25* 2.05* 2.10* 2.00*  CALCIUM 8.5 8.7 8.6 8.7 8.9    Liver Function Tests: No results found for this basename: AST, ALT, ALKPHOS, BILITOT, PROT, ALBUMIN,  in the last 168 hours  CBC:  Recent Labs Lab 07/01/13 0218 07/04/13 0245 07/06/13 0305  WBC 9.8 8.2 7.6  HGB 13.3 11.6* 11.6*  HCT 42.7 36.1* 36.5*  MCV 93.8 92.8 92.4  PLT 288 333 355   BNP (last 3 results)  Recent Labs  06/29/13 0330  PROBNP 1004.0*   CBG:  Recent Labs Lab 07/06/13 1211 07/06/13 1623 07/06/13 2137 07/07/13 0745 07/07/13 1239  GLUCAP 231* 194* 308* 165* 184*    Recent Results (from the past 240 hour(s))  URINE CULTURE     Status: None   Collection Time    07/03/13  7:17 PM      Result Value Range Status   Specimen Description URINE, CLEAN CATCH   Final   Special Requests NONE   Final   Culture  Setup Time     Final   Value: 07/04/2013 05:11     Performed at [facility] Count     Final   Value: >=100,000 COLONIES/ML     Performed at [facility]   Culture     Final   Value: PROTEUS MIRABILIS     Performed at [facility]   Report Status 07/06/2013 FINAL   Final    Organism ID, Bacteria PROTEUS MIRABILIS   Final     Studies:  Recent x-ray studies have been reviewed in detail by the Attending Physician  Scheduled Meds:  Scheduled Meds: . amLODipine  10 mg Oral Daily  . antiseptic oral rinse  15 mL Mouth Rinse q12n4p  . cabergoline  0.25 mg Oral 2 times weekly  . chlorhexidine  15 mL Mouth Rinse BID  . cloNIDine  0.3 mg Transdermal Weekly  . feeding supplement (ENSURE)  1 Container Oral TID BM  . heparin  5,000 Units Subcutaneous Q8H  . hydrALAZINE  25 mg Oral Q8H  . insulin aspart  0-20 Units Subcutaneous TID WC  . insulin aspart  4 Units Subcutaneous TID WC  . insulin glargine  20 Units Subcutaneous Daily  . labetalol  200 mg Oral BID  . levofloxacin  250 mg Oral Daily  . pantoprazole  40 mg Oral Daily    Time spent on care of this patient: 35 min   MCCLUNG,JEFFREY T, MD  [facility] Office  [phone] Pager - Text Page per Shea Evans as per below:  On-Call/Text Page:      [email or web address]      password [email or web address]  If 7PM-7AM, please contact night-coverage [email or web address] Password [email or web address] 07/07/2013, 2:24 PM   LOS: 16 days

## 2013-07-07 NOTE — Progress Notes (Signed)
Patient placed on home CPAP at 2350. Patient is using his own machine and nasal mask. No problems at this time.

## 2013-07-07 NOTE — Progress Notes (Signed)
BP continues to climb despite scheduled PO hydralazine administration. NP notified. New orders received. Will continue to monitor.   Lum Babe, RN

## 2013-07-07 NOTE — Progress Notes (Signed)
Speech Language Pathology Treatment: Dysphagia  Patient Details Name: Blake Bell MRN: [id number] DOB: 04-Jul-1950 Today's Date: 07/07/2013 Time: 4098-1191 SLP Time Calculation (min): 22 min  Assessment / Plan / Recommendation Clinical Impression  Continued f/u for dysphagia: Pt sitting in chair at EOB.  Per nursing, has been hungry and has eaten 100% of meals today.  Pt's improvements in MS have dramatically improved his safety with POs. Diagnostic tx focused on clinical assessment of upgraded textures - pt continues to present with wet phonation, cough after trials of thin liquid.  Nectar-thick liquids, when consumed from a spoon, were tolerated well with no overt s/s of penetration/aspiration; larger boluses from a cup elicited cough.  Honey-thick liquids elicited no s/s of airway compromise.  Pt's lungs have been diminished but clear; remains afebrile; RR compatible with adequate swallow/respiratory coordination.  No longer requiring supplemental O2 to maintain sats.    Given these improvements, as well as improved mentation, recommend advancing diet to Dysphagia 1 with honey-thick liquids from cup.  May have nectar-thick liquids from spoon when sitting in chair and when administered by staff.  Continues to require full supervision given confusion.  (Demonstrates improved ST memory, but is confabulatory, and MS continues to fluctuate.)  SLP will follow for toleration and to determine if repeat FEES is necessary/beneficial at this juncture.    HPI HPI: 63 yo male with altered mental status and seizures. Intubated for airway protection. BP in ER 214/148. PCCM asked to admit to ICU. Self extubated 06/27/13. Requires 6L O2 to maintain oxygenation. Very lethargic since extubation and difficult to arouse/assess. Follows some commands for nursing. On and off BiPAP every 4 hours. Pt needs to take oral meds per MD.   Pertinent Vitals RR in mid 2s  SLP Plan  Continue with current plan of care     Recommendations Diet recommendations: Dysphagia 1 (puree);Honey-thick liquid; may have nectars from spoon only if fed by staff - nectar liquids should not be left with pt unsupervised. Liquids provided via: Cup Medication Administration: Crushed with puree Supervision: Staff to assist with self feeding Compensations: Slow rate;Small sips/bites;Check for pocketing;Clear throat intermittently Postural Changes and/or Swallow Maneuvers: Seated upright 90 degrees;Upright 30-60 min after meal              Oral Care Recommendations: Oral care BID Follow up Recommendations: Skilled Nursing facility Plan: Continue with current plan of care  Blake Bell L. Tivis Ringer, [place] CCC/SLP Pager [phone]      Blake Bell 07/07/2013, 4:24 PM

## 2013-07-07 NOTE — Progress Notes (Signed)
Upon entering the room, patient found to be trying to get up out of bed. RN tried to redirect patient that he could not, he stated that he wanted to leave. Began kicking and hitting. NP notified. New orders received. Will continue to monitor.   Lum Babe, RN

## 2013-07-07 NOTE — Progress Notes (Signed)
Report called, pt was going to 4N02, now going to room 4N24 via bed with belongings, notified wife of new room.  Number. Brought bariatric chair and bedside come with pt.

## 2013-07-07 NOTE — Care Management Note (Signed)
    Page 1 of 1   07/07/2013     5:19:13 PM   CARE MANAGEMENT NOTE 07/07/2013  Patient:  Sharrow,Kodee   Account Number:  [id number]  Date Initiated:  06/28/2013  Documentation initiated by:  Sandi Mariscal  Subjective/Objective Assessment:   Acute respiratory failure in setting of encephalopathy and seizures. Self-extubated on 1/11.     Action/Plan:   await medical stability to make definite plans   Anticipated DC Date:  07/09/2013   Anticipated DC Plan:  SKILLED NURSING FACILITY         Choice offered to / List presented to:             Status of service:  In process, will continue to follow Medicare Important Message given?   (If response is "NO", the following Medicare IM given date fields will be blank) Date Medicare IM given:   Date Additional Medicare IM given:    Discharge Disposition:    Per UR Regulation:  Reviewed for med. necessity/level of care/duration of stay  If discussed at [place] of Stay Meetings, dates discussed:   07/06/2013    Comments:  07/07/13- 1530- Marvetta Gibbons RN, BSN [phone] Spoke with pt's son and wife at bedside regarding d/c plans CIR vs SNF- confirmed that family wants SNF at discharge- CSW already aware  and consulted for placement-  07/05/13- 1530 - Marvetta Gibbons RN, BSN [phone] CIR consulted on 06/1613- following for potential admission-

## 2013-07-07 NOTE — Progress Notes (Signed)
Physical Therapy Treatment Patient Details Name: Mabel Unrein MRN: [id number] DOB: 10-03-1950 Today's Date: 07/07/2013 Time: 4270-6237 PT Time Calculation (min): 24 min  PT Assessment / Plan / Recommendation  History of Present Illness  63 yo male with altered mental status and seizures. Intubated for airway protection. BP in ER 214/148. PCCM asked to admit to ICU. Self extubated 06/27/13. Requires 6L O2 to maintain oxygenation. Very lethargic since extubation and difficult to arouse/assess.    PT Comments   Pt was able to ambulate minimally today and transfer to chair with 2 person (A) for safety. Pt continues to demo decreased activity tolerance and balance deficits, which is limiting his independence with mobility. Had lengthy discussion with wife and pt regarding D/c disposition. Wife reports she will be able to stay with husband 24/7 but is unable to provide anymore than supervision (A) at this time. Pt and his wife were very interested and agreeable to CIR for family education and to increase patient's independence with mobility prior to D/c home. Family would like to speak with CIR regarding additional information. Pt highly motivated and is a great candidate at this time. Will cont to follow per POC.   Follow Up Recommendations  CIR     Does the patient have the potential to tolerate intense rehabilitation     Barriers to Discharge        Equipment Recommendations  Other (comment)    Recommendations for Other Services OT consult  Frequency Min 4X/week   Progress towards PT Goals Progress towards PT goals: Progressing toward goals  Plan Current plan remains appropriate    Precautions / Restrictions Precautions Precautions: Fall Precaution Comments: Pt 385 pounds Restrictions Weight Bearing Restrictions: No   Pertinent Vitals/Pain VSS    Mobility  Bed Mobility Overal bed mobility: Needs Assistance Bed Mobility: Supine to Sit Supine to sit: Mod assist;HOB  elevated General bed mobility comments: pt was able to advance LEs off EOB  today with max cues and facilitation to initate movement; pt required mod (A) to bring shoulders to sitting position  Transfers Overall transfer level: Needs assistance Equipment used: 1 person hand held assist Transfers: Sit to/from Stand Sit to Stand: +2 safety/equipment;Min assist General transfer comment: pt able to perform sit to stand 2 trials, initially from bed; 2 person (A) for safety only; pt able to power up to stand with increased independence today; requires cues for hand placement and safety; pt fatigues quickly and requires sitting rest break but is agreeable and motivated to perform again   Ambulation/Gait Ambulation/Gait assistance: +2 safety/equipment;Min assist Ambulation Distance (Feet): 8 Feet Assistive device: 1 person hand held assist Gait Pattern/deviations: Decreased stride length;Shuffle;Trunk flexed;Wide base of support Gait velocity: decreased Gait velocity interpretation: <1.8 ft/sec, indicative of risk for recurrent falls General Gait Details: pt able to take short shuffled steps with Lt UE support; mulitmodal cues for upright posture and sequencing; pt fatigues quickly and demo decreased activity tolerance with ambulating          PT Diagnosis:    PT Problem List:   PT Treatment Interventions:     PT Goals (current goals can now be found in the care plan section) Acute Rehab PT Goals Patient Stated Goal: "i just want to get better so i can go home"  PT Goal Formulation: With patient/family Time For Goal Achievement: 07/15/13 Potential to Achieve Goals: Good  Visit Information  Last PT Received On: 07/07/13 Assistance Needed: +2 History of Present Illness:  63 yo male  with altered mental status and seizures. Intubated for airway protection. BP in ER 214/148. PCCM asked to admit to ICU. Self extubated 06/27/13. Requires 6L O2 to maintain oxygenation. Very lethargic since extubation  and difficult to arouse/assess.     Subjective Data  Subjective: "i just want to get out of bed and pee." pt tearful when talking about his wife, who is in the room.  Patient Stated Goal: "i just want to get better so i can go home"    Cognition  Cognition Arousal/Alertness: Awake/alert Behavior During Therapy: Flat affect Overall Cognitive Status: Impaired/Different from baseline Area of Impairment: Safety/judgement;Problem solving Current Attention Level: Sustained Memory: Decreased recall of precautions;Decreased short-term memory Following Commands: Follows one step commands with increased time Safety/Judgement: Decreased awareness of safety;Decreased awareness of deficits Awareness: Intellectual Problem Solving: Slow processing;Decreased initiation;Difficulty sequencing;Requires verbal cues;Requires tactile cues    Balance  Balance Overall balance assessment: Needs assistance;History of Falls Sitting-balance support: Feet supported;Single extremity supported Sitting balance-Leahy Scale: Good Sitting balance - Comments: no posterior lean today Standing balance support: Single extremity supported;During functional activity Standing balance-Leahy Scale: Poor Standing balance comment: Pt with Lt Ue support during standing; requries (A) to maintain balance and demo decreased balance when fatigued; tolerated standing~3 min total with sitting rest break    End of Session PT - End of Session Equipment Utilized During Treatment: Gait belt Activity Tolerance: Patient tolerated treatment well Patient left: in chair;with call bell/phone within reach;with family/visitor present Nurse Communication: Mobility status;Precautions   GP     Gustavus Bryant, [place] 07/07/2013, 4:03 PM

## 2013-07-08 LAB — BASIC METABOLIC PANEL
BUN: 18 mg/dL (ref 6–23)
CALCIUM: 8.8 mg/dL (ref 8.4–10.5)
CO2: 23 meq/L (ref 19–32)
CREATININE: 1.88 mg/dL — AB (ref 0.50–1.35)
Chloride: 114 mEq/L — ABNORMAL HIGH (ref 96–112)
GFR calc Af Amer: 43 mL/min — ABNORMAL LOW (ref >90)
GFR, EST NON AFRICAN AMERICAN: 37 mL/min — AB (ref >90)
Glucose, Bld: 143 mg/dL — ABNORMAL HIGH (ref 70–99)
Potassium: 3.2 mEq/L — ABNORMAL LOW (ref 3.7–5.3)
Sodium: 150 mEq/L — ABNORMAL HIGH (ref 137–147)

## 2013-07-08 LAB — GLUCOSE, CAPILLARY
GLUCOSE-CAPILLARY: 149 mg/dL — AB (ref 70–99)
Glucose-Capillary: 140 mg/dL — ABNORMAL HIGH (ref 70–99)
Glucose-Capillary: 191 mg/dL — ABNORMAL HIGH (ref 70–99)
Glucose-Capillary: 209 mg/dL — ABNORMAL HIGH (ref 70–99)

## 2013-07-08 MED ORDER — LEVOFLOXACIN 500 MG PO TABS
500.0000 mg | ORAL_TABLET | Freq: Every day | ORAL | Status: AC
  Administered 2013-07-09 – 2013-07-13 (×5): 500 mg via ORAL
  Filled 2013-07-08 (×5): qty 1

## 2013-07-08 MED ORDER — LORAZEPAM 2 MG/ML IJ SOLN
1.0000 mg | INTRAMUSCULAR | Status: AC
Start: 2013-07-08 — End: 2013-07-08
  Administered 2013-07-08: 1 mg via INTRAMUSCULAR

## 2013-07-08 MED ORDER — POTASSIUM CHLORIDE CRYS ER 20 MEQ PO TBCR
40.0000 meq | EXTENDED_RELEASE_TABLET | ORAL | Status: AC
  Administered 2013-07-08 (×3): 40 meq via ORAL
  Filled 2013-07-08 (×2): qty 2

## 2013-07-08 MED ORDER — SODIUM CHLORIDE 0.45 % IV SOLN
INTRAVENOUS | Status: DC
  Administered 2013-07-08 – 2013-07-10 (×4): via INTRAVENOUS

## 2013-07-08 MED ORDER — CABERGOLINE 0.5 MG PO TABS
0.2500 mg | ORAL_TABLET | ORAL | Status: DC
  Administered 2013-07-08 – 2013-07-12 (×2): 0.25 mg via ORAL
  Filled 2013-07-08 (×2): qty 1

## 2013-07-08 NOTE — Progress Notes (Signed)
Progress Note    Blake Bell NID:[id number] DOB: 1951/03/21 DOA: 06/21/2013 PCP: [name], MD  Brief narrative: 63 y/o male with h/o HTN, DM, OSA, CKD admitted for seizures and intubated in ER.   SIGNIFICANT EVENTS:  1/5 Admit, Neurology consulted  1/6 Off insulin gtt  1/7 Possible aspiration of tube feeds  1/11 Self extubated, put on BiPAP  1/12 on nicardipine  1/13 WBC trending up, BIPAP improving  1/14 self dc'ed panda, 4hr on/4hrs off BiPAP  1/15 Diet initiated, PICC replaced  STUDIES:  1/5 CT head >> mild atrophy with patchy small vessel disease, ?aneurysm Rt superior to sella  1/5 CT angio head >> no aneurysm, 13 mm mass Rt sella/suprasellar region  1/6 EEG >> normal sleep EEG  1/8 Echo >> mild LVH, EF 55 to 60%, mod LA dilation  1/12 CXR >> Slight improvement in congestive heart failure/interstitial edema  1/14 CXR>> Stable cardiomegaly, minimal interstitial pulmonary edema.  CXR 1/15 PM >> Continued Pulm edema.   LINES / TUBES:  1/5 ETT>>> 1/11  1/7 Rt PICC>>> exchanged over guidewire 1/15   CULTURES:  1/5 BC>>> Neg  1/5 MRSA >> positive   Subjective: Agitated last PM but calmer today Wants to get up to chair  Assessment/Plan:  Seizure - suspected to be due to malignant HTN and DKA and possibly PRESS however due to patient size unable to obtain MRI to confirm PRESS - no AED recommended by Neurology at this time  Malignant hypertension - Blood pressure much better controlled, but remains elevated above goal - adjust tx regimen - follow trend   Altered mental status / acute hypertensive encephalopathy - slowly but steadily improving - avoid sedating meds - work to restore sleep/wake cycle   DKA - DM (diabetes mellitus), type 2 - DKA now resolved - was on oral meds only prior to admit  - HgA1c 10.5 - adjust tx gently today for improved control   Hypokalemia - replete Check Mg  Proteus UTI - amp + cefazolin resistant  - oral  Levaquin  OSA/ OHS - pt now agrees to wear his home CPAP QHS - will accept this over not wearing anything   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  - 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 per radiology read - will need MRI dedicated to the sella to better characterize once d/c and able to have "open" MRI as outpt - reportedly pt is unable to fit in our MRI at [facility]  - prolactin levels severely elevated w/ markedly suppressed LH and FSH - initiated cabergoline given size of mass as well as prolactin level  AKI (acute kidney injury) / CKD (chronic kidney disease) stage 3, GFR 30-59 ml/min - Cr slowly improving - follow   Acute respiratory failure - in setting of encephalopathy and seizures - resolved   Hypernatremia - place PICC and give 1/2 NS  Morbid obesity  Venous stasis ulcers - stable  Poor venous access - ask for PICC as patient is full code and needs IVF   Code Status: FULL  Family Communication: spoke w/ pt, wife Disposition Plan: SNF  Consultants: Neurology  Antibiotics: Acyclovir 1/5>>>1/8 Rocephin 1/18 Cefuroxime 1/19 > 1/20 Levaquin 1/20 >>   DVT prophylaxis: SQ Heparin  Objective: Blood pressure 187/101, pulse 77, temperature 97.7 F (36.5 C), temperature source Oral, resp. rate 18, height 6' (1.829 m), weight 175 kg (385 lb 12.9 oz), SpO2 100.00%.  Intake/Output Summary (Last 24 hours) at 07/08/13 1259 Last data filed at 07/08/13 0800  Gross per 24 hour  Intake   1080 ml  Output    200 ml  Net    880 ml   Exam: General:  no acute respiratory distress, obese male Lungs: Clear to auscultation bilaterally without wheezes or crackles but breath sounds are very distant Cardiovascular: Regular rate and rhythm without murmur gallop or rub - heart sounds are very distant Abdomen: Morbidly obese, nontender, nondistended, soft, bowel sounds positive, no rebound, no ascites, no appreciable mass Extremities: No significant cyanosis, clubbing;  1+ edema  bilateral lower extremities - venous statis ulcers dressed and dry  Data Reviewed: Basic Metabolic Panel:  Recent Labs Lab 07/04/13 0245 07/05/13 0250 07/06/13 0305 07/07/13 0357 07/08/13 0555  NA 149* 149* 149* 149* 150*  K 3.2* 4.2 3.5* 3.8 3.2*  CL 111 114* 113* 113* 114*  CO2 25 20 24 21 23   GLUCOSE 211* 122* 207* 198* 143*  BUN 28* 25* 25* 21 18  CREATININE 2.25* 2.05* 2.10* 2.00* 1.88*  CALCIUM 8.7 8.6 8.7 8.9 8.8    Liver Function Tests: No results found for this basename: AST, ALT, ALKPHOS, BILITOT, PROT, ALBUMIN,  in the last 168 hours  CBC:  Recent Labs Lab 07/04/13 0245 07/06/13 0305  WBC 8.2 7.6  HGB 11.6* 11.6*  HCT 36.1* 36.5*  MCV 92.8 92.4  PLT 333 355   BNP (last 3 results)  Recent Labs  06/29/13 0330  PROBNP 1004.0*   CBG:  Recent Labs Lab 07/07/13 0745 07/07/13 1239 07/07/13 1550 07/08/13 0646 07/08/13 1138  GLUCAP 165* 184* 286* 149* 140*    Recent Results (from the past 240 hour(s))  URINE CULTURE     Status: None   Collection Time    07/03/13  7:17 PM      Result Value Range Status   Specimen Description URINE, CLEAN CATCH   Final   Special Requests NONE   Final   Culture  Setup Time     Final   Value: 07/04/2013 05:11     Performed at [facility] Count     Final   Value: >=100,000 COLONIES/ML     Performed at [facility]   Culture     Final   Value: PROTEUS MIRABILIS     Performed at [facility]   Report Status 07/06/2013 FINAL   Final   Organism ID, Bacteria PROTEUS MIRABILIS   Final       Scheduled Meds:  Scheduled Meds: . amLODipine  10 mg Oral Daily  . antiseptic oral rinse  15 mL Mouth Rinse q12n4p  . cabergoline  0.25 mg Oral 2 times weekly  . chlorhexidine  15 mL Mouth Rinse BID  . cloNIDine  0.2 mg Oral TID  . feeding supplement (ENSURE)  1 Container Oral TID BM  . heparin  5,000 Units Subcutaneous Q8H  . hydrALAZINE  50 mg Oral Q8H  . insulin aspart  0-20 Units  Subcutaneous TID WC  . insulin aspart  6 Units Subcutaneous TID WC  . insulin glargine  24 Units Subcutaneous Daily  . labetalol  200 mg Oral BID  . levofloxacin  250 mg Oral Daily  . pantoprazole  40 mg Oral Daily  . potassium chloride  40 mEq Oral Q4H    Time spent on care of this patient: 35 min   [name], [place] [facility] Office  [phone] Pager - Text Page per Shea Evans as per below:  On-Call/Text Page:  [email or web address]      password [email or web address]  If 7PM-7AM, please contact night-coverage [email or web address] Password [facility] 07/08/2013, 12:59 PM   LOS: 17 days

## 2013-07-08 NOTE — Progress Notes (Signed)
Speech Language Pathology Treatment: Dysphagia  Patient Details Name: Blake Bell MRN: [id number] DOB: [birth year] Today's Date: 07/08/2013 Time: 5956-3875 SLP Time Calculation (min): 20 min  Assessment / Plan / Recommendation Clinical Impression  Pt today alert but asking to see "Mr Blake Bell" and disoriented, complaining of dyspnea and restrained- RN made aware and came to see pt - oxygen sats were 97% and pt was afebrile.  Pt removed Bipap last evening per documentation- ? If this contributing to his dyspnea report.    Pt requested liquid complaining of being "dry".  Due to pt being in bed, postioning being poor and pt complaint of dyspnea, SLP did not provide nectar liquids, however provided pt with honey thick and orange magic cup.  Oral prep and suspected pharyngeal swallow delay noted with delayed cough x1 after 5 swallows.  Pt also with baseline cough prior to any intake, therefore cannot definitively determine aspiration.    Due to multiple factors negatively impacting pt's ability to consume po, recommend continue modified diet with strict precautions.  Pt may benefit from repeat FEES with continued medical improvement given sensorimotor deficits apparent on previous FEES study.       HPI HPI: 63 yo male with altered mental status and seizures. Intubated for airway protection. BP in ER 214/148. PCCM asked to admit to ICU. Self extubated 06/27/13. Requires 6L O2 to maintain oxygenation. Very lethargic since extubation and difficult to arouse/assess. Follows some commands for nursing. On and off BiPAP every 4 hours. Pt has been seen for swallow evaluation and has undergone FEES, placed on puree/honey thick diet.  SLP to see pt to assess tolerance of po diet.     Pertinent Vitals Afebrile, decreased  SLP Plan  Continue with current plan of care;Other (Comment) (consider repeat FEES prior to discharge to assure least restrictive diet)    Recommendations Diet recommendations: Dysphagia 1  (puree);Honey-thick liquid (nectar via tsp from staff only when pt fully upright) Liquids provided via: Cup Medication Administration: Crushed with puree Supervision: Staff to assist with self feeding Compensations: Slow rate;Small sips/bites;Check for pocketing Postural Changes and/or Swallow Maneuvers: Upright 30-60 min after meal;Seated upright 90 degrees (as upright as able in bed)              Oral Care Recommendations: Oral care BID Follow up Recommendations: Skilled Nursing facility Plan: Continue with current plan of care;Other (Comment) (consider repeat FEES prior to discharge to assure least restrictive diet)    [place], [place] SLP [phone]

## 2013-07-08 NOTE — Progress Notes (Signed)
Approximately [phone] patient wake up from sleep, became restless and agitated, won't stay in bed, trying hit staff. Called security. Ativan prn given didn't help any. Notified Kirby/NP, received order for restraints and ativan once. Medication given and placed patient on restraints.

## 2013-07-08 NOTE — Progress Notes (Signed)
This nurse took 2 mg of ativan from pyxis and administered 0.5 mg IM to the patient. 15-20 minutes later received a new order to administer 1 mg of ativan IM once. I used the remaining 1 mg of ativan from the same vial to administer. So wasted remaining  0.5 mg of ativan in the pyxis. Notified pharmacist about the undocumented waste.  K Kolangayil James/ RN, [employer]

## 2013-07-08 NOTE — Clinical Social Work Psychosocial (Signed)
Clinical Social Work Department BRIEF PSYCHOSOCIAL ASSESSMENT 07/08/2013  Patient:  Blake Bell,Blake Bell     Account Number:  [id number]     Admit date:  06/21/2013  Clinical Social Worker:  Donna Christen  Date/Time:  07/08/2013 02:08 PM  Referred by:  Physician  Date Referred:  07/08/2013 Referred for  SNF Placement   Other Referral:   none.   Interview type:  Patient Other interview type:   Pt's wife was also present at bedside.    PSYCHOSOCIAL DATA Living Status:  WIFE Admitted from facility:   Level of care:   Primary support name:  Blake Bell Primary support relationship to patient:  SPOUSE Degree of support available:   Strong support system, however, pt's wife referred any questions or decisions to their son Blake Bell.    CURRENT CONCERNS Current Concerns  Post-Acute Placement   Other Concerns:   none.    SOCIAL WORK ASSESSMENT / PLAN CSW met with pt and pt's wife at bedside. Pt and pt's wife stated that they would like for pt to be placed in a SNF in [facility]. CSW explained process of SNF placement. Pt's wife stated that bed offers should be given to pt's son, Blake Bell. CSW confirmed this with pt. CSW to fax clinical information and continue to follow for discharge planning needs.   Assessment/plan status:  Psychosocial Support/Ongoing Assessment of Needs Other assessment/ plan:   none.   Information/referral to community resources:   [facility] bed offers.    PATIENTS/FAMILYS RESPONSE TO PLAN OF CARE: Pt and pt's wife were understanding and agreeable to CSW plan of care.       Blake Bell, [place] Social Worker [phone]

## 2013-07-08 NOTE — Clinical Social Work Placement (Addendum)
Clinical Social Work Department CLINICAL SOCIAL WORK PLACEMENT NOTE 07/08/2013  Patient:  Moulin,Teller  Account Number:  [id number] Admit date:  06/21/2013  Clinical Social Worker:  Raquel Sarna [place], LCSWA  Date/time:  07/08/2013 03:06 PM  Clinical Social Work is seeking post-discharge placement for this patient at the following level of care:   SKILLED NURSING   (*CSW will update this form in Epic as items are completed)   07/08/2013  Patient/family provided with [place] Department of Clinical Social Works list of facilities offering this level of care within the geographic area requested by the patient (or if unable, by the patients family).  07/08/2013  Patient/family informed of their freedom to choose among providers that offer the needed level of care, that participate in Medicare, Medicaid or managed care program needed by the patient, have an available bed and are willing to accept the patient.  07/08/2013  Patient/family informed of [facility] ownership interest in [facility], as well as of the fact that they are under no obligation to receive care at this facility.  PASARR submitted to EDS on 07/08/2013 PASARR number received from EDS on 07/09/2013  FL2 transmitted to all facilities in geographic area requested by pt/family on  07/08/2013 FL2 transmitted to all facilities within larger geographic area on   Patient informed that his/her managed care company has contracts with or will negotiate with  certain facilities, including the following:     Patient/family informed of bed offers received:  07/10/2013 Patient chooses bed at [facility] Physician recommends and patient chooses bed at    Patient to be transferred to  on  07/12/2013 Patient to be transferred to facility by [facility] or family vehicle  The following physician request were entered in Epic:   Additional Comments:  Pati Gallo, [place] Social  Worker [phone]

## 2013-07-08 NOTE — Progress Notes (Signed)
RT placed patient on CPAP.  Pt's  home unit, home nasal mask, and home settings.

## 2013-07-09 DIAGNOSIS — E870 Hyperosmolality and hypernatremia: Secondary | ICD-10-CM

## 2013-07-09 LAB — BASIC METABOLIC PANEL
BUN: 17 mg/dL (ref 6–23)
CHLORIDE: 109 meq/L (ref 96–112)
CO2: 22 mEq/L (ref 19–32)
CREATININE: 1.92 mg/dL — AB (ref 0.50–1.35)
Calcium: 8.4 mg/dL (ref 8.4–10.5)
GFR calc Af Amer: 41 mL/min — ABNORMAL LOW (ref >90)
GFR calc non Af Amer: 36 mL/min — ABNORMAL LOW (ref >90)
GLUCOSE: 169 mg/dL — AB (ref 70–99)
POTASSIUM: 4.1 meq/L (ref 3.7–5.3)
Sodium: 144 mEq/L (ref 137–147)

## 2013-07-09 LAB — CBC
HEMATOCRIT: 34.1 % — AB (ref 39.0–52.0)
Hemoglobin: 11.3 g/dL — ABNORMAL LOW (ref 13.0–17.0)
MCH: 29.7 pg (ref 26.0–34.0)
MCHC: 33.1 g/dL (ref 30.0–36.0)
MCV: 89.7 fL (ref 78.0–100.0)
Platelets: 321 10*3/uL (ref 150–400)
RBC: 3.8 MIL/uL — ABNORMAL LOW (ref 4.22–5.81)
RDW: 17.6 % — ABNORMAL HIGH (ref 11.5–15.5)
WBC: 6 10*3/uL (ref 4.0–10.5)

## 2013-07-09 LAB — GLUCOSE, CAPILLARY
GLUCOSE-CAPILLARY: 226 mg/dL — AB (ref 70–99)
GLUCOSE-CAPILLARY: 130 mg/dL — AB (ref 70–99)
Glucose-Capillary: 155 mg/dL — ABNORMAL HIGH (ref 70–99)
Glucose-Capillary: 157 mg/dL — ABNORMAL HIGH (ref 70–99)

## 2013-07-09 MED ORDER — CLONIDINE HCL 0.3 MG PO TABS
0.3000 mg | ORAL_TABLET | Freq: Three times a day (TID) | ORAL | Status: DC
  Administered 2013-07-09 – 2013-07-13 (×12): 0.3 mg via ORAL
  Filled 2013-07-09 (×16): qty 1

## 2013-07-09 MED ORDER — HYDRALAZINE HCL 50 MG PO TABS
75.0000 mg | ORAL_TABLET | Freq: Three times a day (TID) | ORAL | Status: DC
  Administered 2013-07-09 – 2013-07-10 (×3): 75 mg via ORAL
  Filled 2013-07-09 (×7): qty 1

## 2013-07-09 NOTE — Clinical Social Work Note (Signed)
CSW has attempted to call and speak to pt's son (per pt and pt's wife request) regarding SNF bed offers. CSW has left voicemail with a request for pt's son to return CSW call. CSW has left a handoff for weekend CSW to follow-up with son.  Pati Gallo, [place] Social Worker [phone]

## 2013-07-09 NOTE — Progress Notes (Signed)
Physical Therapy Treatment Patient Details Name: Tovia Kisner MRN: [id number] DOB: Jan 19, 1951 Today's Date: 07/09/2013 Time: 7673-4193 PT Time Calculation (min): 39 min  PT Assessment / Plan / Recommendation  History of Present Illness  63 yo male with altered mental status and seizures. Intubated for airway protection. BP in ER 214/148. PCCM asked to admit to ICU. Self extubated 06/27/13. Requires 6L O2 to maintain oxygenation. Very lethargic since extubation and difficult to arouse/assess.    PT Comments   Patient progressing very well with mobility this session and is highly motivated to work and progress with therapy. Patient is attempting to get 24 hour supervision at DC so he can participate in CIR. Patient was given bariatric brown recliner to sit in verses the large blue bariatric chair and patient was very happy and please with how much better it felt to sit in and was very grateful to be up and out of bed. RN aware of transfer and how well patient is moving and that nursing staff needs to be getting patient up and out of bed on a regular basis  Follow Up Recommendations  CIR     Does the patient have the potential to tolerate intense rehabilitation     Barriers to Discharge        Equipment Recommendations       Recommendations for Other Services    Frequency Min 4X/week   Progress towards PT Goals Progress towards PT goals: Progressing toward goals  Plan Current plan remains appropriate    Precautions / Restrictions Precautions Precautions: Fall Precaution Comments: Pt 385 pounds   Pertinent Vitals/Pain no apparent distress     Mobility  Bed Mobility Overal bed mobility: Needs Assistance Supine to sit: Min assist General bed mobility comments: Min A to steady. Patient uses momentum to swing into sitting Transfers Equipment used: 1 person hand held assist Transfers: Sit to/from Stand Sit to Stand: +2 safety/equipment;Min assist General transfer comment:  Patient able to stand x3 requiring less assist with each stand. Cued for positoning with standing and prior to sitting.  Ambulation/Gait Ambulation/Gait assistance: +2 physical assistance;Min assist Ambulation Distance (Feet): 15 Feet Assistive device: 2 person hand held assist Gait Pattern/deviations: Decreased stride length;Step-through pattern;Wide base of support Gait velocity: decreased Gait velocity interpretation: <1.8 ft/sec, indicative of risk for recurrent falls General Gait Details: Patient able to walk more today with +2 for safety and patient confidence. Patient stated intially slightly dizzy but it did subside. 2 small standing breaks required    Exercises     PT Diagnosis:    PT Problem List:   PT Treatment Interventions:     PT Goals (current goals can now be found in the care plan section)    Visit Information  Last PT Received On: 07/09/13 Assistance Needed: +2 (for safety overall due to size) History of Present Illness:  63 yo male with altered mental status and seizures. Intubated for airway protection. BP in ER 214/148. PCCM asked to admit to ICU. Self extubated 06/27/13. Requires 6L O2 to maintain oxygenation. Very lethargic since extubation and difficult to arouse/assess.     Subjective Data      Cognition  Cognition Arousal/Alertness: Awake/alert Behavior During Therapy: WFL for tasks assessed/performed Overall Cognitive Status: Within Functional Limits for tasks assessed    Balance  Balance Sitting balance-Leahy Scale: Good Standing balance-Leahy Scale: Fair  End of Session PT - End of Session Activity Tolerance: Patient tolerated treatment well Patient left: in chair;with call bell/phone within reach Nurse  Communication: Mobility status   GP     Jacqualyn Posey 07/09/2013, 11:50 AM 07/09/2013 Jacqualyn Posey PTA [phone] pager [phone] office

## 2013-07-09 NOTE — Progress Notes (Signed)
Pt all of a sudden stated he feels like he as going to die therefore want to go home AMA to met his 63 years old mother who will be visiting  from out of town pt  . Pt depressed RN talk with pt and wanted to call the chaplain if pt needs  Spiritual counseling but pt stated  He already pray and is okay now. Request to see Md  paged for ptt  Report given to oncoming RN. Pt in no acute distress.

## 2013-07-09 NOTE — Progress Notes (Signed)
Progress Note    Blake Bell MWN:[id number] DOB: [birth year] DOA: 06/21/2013 PCP: [name], MD  Brief narrative: 63 y/o male with h/o HTN, DM, OSA, CKD admitted for seizures and intubated in ER.   SIGNIFICANT EVENTS:  1/5 Admit, Neurology consulted  1/6 Off insulin gtt  1/7 Possible aspiration of tube feeds  1/11 Self extubated, put on BiPAP  1/12 on nicardipine  1/13 WBC trending up, BIPAP improving  1/14 self dc'ed panda, 4hr on/4hrs off BiPAP  1/15 Diet initiated, PICC replaced  STUDIES:  1/5 CT head >> mild atrophy with patchy small vessel disease, ?aneurysm Rt superior to sella  1/5 CT angio head >> no aneurysm, 13 mm mass Rt sella/suprasellar region  1/6 EEG >> normal sleep EEG  1/8 Echo >> mild LVH, EF 55 to 60%, mod LA dilation  1/12 CXR >> Slight improvement in congestive heart failure/interstitial edema  1/14 CXR>> Stable cardiomegaly, minimal interstitial pulmonary edema.  CXR 1/15 PM >> Continued Pulm edema.   LINES / TUBES:  1/5 ETT>>> 1/11  1/7 Rt PICC>>> exchanged over guidewire 1/15   CULTURES:  1/5 BC>>> Neg  1/5 MRSA >> positive   Subjective: Doing well No sob No cp  Assessment/Plan:  Seizure - suspected to be due to malignant HTN and DKA and possibly PRESS however due to patient size unable to obtain MRI to confirm PRESS - no AED recommended by Neurology at this time  Malignant hypertension - Blood pressure much better controlled  Altered mental status / acute hypertensive encephalopathy - resolved  DKA - DM (diabetes mellitus), type 2 - DKA now resolved - was on oral meds only prior to admit  - HgA1c 10.5 - adjust tx gently today for improved control   Hypokalemia - replete Check Mg  Proteus UTI - amp + cefazolin resistant  - oral Levaquin  OSA/ OHS - pt now agrees to wear his home CPAP QHS - will accept this over not wearing anything   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  - expansile right mass noted as possible  macroadenoma per radiology read - will need MRI dedicated to the sella to better characterize once d/c and able to have "open" MRI as outpt - reportedly pt is unable to fit in our MRI at [facility]  - prolactin levels severely elevated w/ markedly suppressed LH and FSH - initiated cabergoline given size of mass as well as prolactin level  AKI (acute kidney injury) / CKD (chronic kidney disease) stage 3, GFR 30-59 ml/min - Cr slowly improving - follow   Acute respiratory failure - in setting of encephalopathy and seizures - resolved   Hypernatremia - place PICC and give 1/2 NS -resolved  Morbid obesity  Venous stasis ulcers - stable  Poor venous access -has peripheral  Code Status: FULL  Family Communication: spoke w/ pt, wife Disposition Plan: SNF when bed available  Consultants: Neurology  Antibiotics: Acyclovir 1/5>>>1/8 Rocephin 1/18 Cefuroxime 1/19 > 1/20 Levaquin 1/20 >>   DVT prophylaxis: SQ Heparin  Objective: Blood pressure 169/93, pulse 65, temperature 98.2 F (36.8 C), temperature source Axillary, resp. rate 20, height 6' (1.829 m), weight 175 kg (385 lb 12.9 oz), SpO2 100.00%.  Intake/Output Summary (Last 24 hours) at 07/09/13 1024 Last data filed at 07/09/13 0849  Gross per 24 hour  Intake 2313.33 ml  Output    400 ml  Net 1913.33 ml   Exam: General:  no acute respiratory distress, obese male Lungs: Clear to auscultation bilaterally without wheezes or crackles but  breath sounds are very distant Cardiovascular: Regular rate and rhythm without murmur gallop or rub - heart sounds are very distant Abdomen: Morbidly obese, nontender, nondistended, soft, bowel sounds positive, no rebound, no ascites, no appreciable mass Extremities: No significant cyanosis, clubbing;  1+ edema bilateral lower extremities - venous statis ulcers dressed and dry  Data Reviewed: Basic Metabolic Panel:  Recent Labs Lab 07/05/13 0250 07/06/13 0305 07/07/13 0357 07/08/13 0555  07/09/13 0545  NA 149* 149* 149* 150* 144  K 4.2 3.5* 3.8 3.2* 4.1  CL 114* 113* 113* 114* 109  CO2 20 24 21 23 22   GLUCOSE 122* 207* 198* 143* 169*  BUN 25* 25* 21 18 17   CREATININE 2.05* 2.10* 2.00* 1.88* 1.92*  CALCIUM 8.6 8.7 8.9 8.8 8.4    Liver Function Tests: No results found for this basename: AST, ALT, ALKPHOS, BILITOT, PROT, ALBUMIN,  in the last 168 hours  CBC:  Recent Labs Lab 07/04/13 0245 07/06/13 0305 07/09/13 0545  WBC 8.2 7.6 6.0  HGB 11.6* 11.6* 11.3*  HCT 36.1* 36.5* 34.1*  MCV 92.8 92.4 89.7  PLT 333 355 321   BNP (last 3 results)  Recent Labs  06/29/13 0330  PROBNP 1004.0*   CBG:  Recent Labs Lab 07/08/13 0646 07/08/13 1138 07/08/13 1639 07/08/13 2308 07/09/13 0628  GLUCAP 149* 140* 191* 209* 155*    Recent Results (from the past 240 hour(s))  URINE CULTURE     Status: None   Collection Time    07/03/13  7:17 PM      Result Value Range Status   Specimen Description URINE, CLEAN CATCH   Final   Special Requests NONE   Final   Culture  Setup Time     Final   Value: 07/04/2013 05:11     Performed at [facility]     Final   Value: >=100,000 COLONIES/ML     Performed at [facility]   Culture     Final   Value: PROTEUS MIRABILIS     Performed at [facility]   Report Status 07/06/2013 FINAL   Final   Organism ID, Bacteria PROTEUS MIRABILIS   Final       Scheduled Meds:  Scheduled Meds: . amLODipine  10 mg Oral Daily  . antiseptic oral rinse  15 mL Mouth Rinse q12n4p  . cabergoline  0.25 mg Oral 2 times weekly  . chlorhexidine  15 mL Mouth Rinse BID  . cloNIDine  0.2 mg Oral TID  . feeding supplement (ENSURE)  1 Container Oral TID BM  . heparin  5,000 Units Subcutaneous Q8H  . hydrALAZINE  50 mg Oral Q8H  . insulin aspart  0-20 Units Subcutaneous TID WC  . insulin aspart  6 Units Subcutaneous TID WC  . insulin glargine  24 Units Subcutaneous Daily  . labetalol  200 mg Oral BID  .  levofloxacin  500 mg Oral Daily  . pantoprazole  40 mg Oral Daily    Time spent on care of this patient: 35 min   Blake Bell, [place] [facility]  [phone] Pager - Text Page per Shea Evans as per below:  On-Call/Text Page:      [email or web address]      password [email or web address]  If 7PM-7AM, please contact night-coverage [email or web address] Password [email or web address] 07/09/2013, 10:24 AM   LOS: 18 days

## 2013-07-09 NOTE — Progress Notes (Signed)
Pt. Has placed on his home CPAP via nasal mask. CPAP is set on auto titrate. Pt. Is tolerating CPAP well at this time without any complications.

## 2013-07-10 LAB — CBC
HCT: 34.4 % — ABNORMAL LOW (ref 39.0–52.0)
Hemoglobin: 11.1 g/dL — ABNORMAL LOW (ref 13.0–17.0)
MCH: 28.8 pg (ref 26.0–34.0)
MCHC: 32.3 g/dL (ref 30.0–36.0)
MCV: 89.4 fL (ref 78.0–100.0)
PLATELETS: 379 10*3/uL (ref 150–400)
RBC: 3.85 MIL/uL — ABNORMAL LOW (ref 4.22–5.81)
RDW: 17.2 % — ABNORMAL HIGH (ref 11.5–15.5)
WBC: 5.9 10*3/uL (ref 4.0–10.5)

## 2013-07-10 LAB — BASIC METABOLIC PANEL
BUN: 18 mg/dL (ref 6–23)
CHLORIDE: 109 meq/L (ref 96–112)
CO2: 24 mEq/L (ref 19–32)
CREATININE: 1.84 mg/dL — AB (ref 0.50–1.35)
Calcium: 8.8 mg/dL (ref 8.4–10.5)
GFR calc non Af Amer: 38 mL/min — ABNORMAL LOW (ref >90)
GFR, EST AFRICAN AMERICAN: 44 mL/min — AB (ref >90)
Glucose, Bld: 210 mg/dL — ABNORMAL HIGH (ref 70–99)
POTASSIUM: 3.8 meq/L (ref 3.7–5.3)
SODIUM: 146 meq/L (ref 137–147)

## 2013-07-10 LAB — GLUCOSE, CAPILLARY
Glucose-Capillary: 175 mg/dL — ABNORMAL HIGH (ref 70–99)
Glucose-Capillary: 191 mg/dL — ABNORMAL HIGH (ref 70–99)
Glucose-Capillary: 126 mg/dL — ABNORMAL HIGH (ref 70–99)
Glucose-Capillary: 113 mg/dL — ABNORMAL HIGH (ref 70–99)

## 2013-07-10 MED ORDER — HYDRALAZINE HCL 50 MG PO TABS
100.0000 mg | ORAL_TABLET | Freq: Three times a day (TID) | ORAL | Status: DC
  Administered 2013-07-10 – 2013-07-13 (×10): 100 mg via ORAL
  Filled 2013-07-10 (×15): qty 2

## 2013-07-10 MED ORDER — FUROSEMIDE 40 MG PO TABS
40.0000 mg | ORAL_TABLET | Freq: Every day | ORAL | Status: DC
  Administered 2013-07-10 – 2013-07-13 (×4): 40 mg via ORAL
  Filled 2013-07-10 (×4): qty 1

## 2013-07-10 MED ORDER — INSULIN GLARGINE 100 UNIT/ML ~~LOC~~ SOLN
30.0000 [IU] | Freq: Every day | SUBCUTANEOUS | Status: DC
  Administered 2013-07-10 – 2013-07-13 (×4): 30 [IU] via SUBCUTANEOUS
  Filled 2013-07-10 (×4): qty 0.3

## 2013-07-10 MED ORDER — ACETAMINOPHEN 325 MG PO TABS: 650.0000 mg | ORAL_TABLET | Freq: Four times a day (QID) | ORAL | Status: DC | PRN

## 2013-07-10 NOTE — Progress Notes (Signed)
Clinical Social Worker (CSW) contacted patient's son Junior Mott [phone] to give bed offers. Son wrote down bed offers and CSW encouraged son to tour the facilities and look online. Son verbalized his understanding.   Blima Rich, [place] [facility] CSW [phone]

## 2013-07-10 NOTE — Progress Notes (Signed)
Progress Note    Bren Steers UXN:[id number] DOB: 1950-08-23 DOA: 06/21/2013 PCP: [name], MD  Brief narrative: 63 y/o male with h/o HTN, DM, OSA, CKD admitted for seizures and intubated in ER.   SIGNIFICANT EVENTS:  1/5 Admit, Neurology consulted  1/6 Off insulin gtt  1/7 Possible aspiration of tube feeds  1/11 Self extubated, put on BiPAP  1/12 on nicardipine  1/13 WBC trending up, BIPAP improving  1/14 self dc'ed panda, 4hr on/4hrs off BiPAP  1/15 Diet initiated, PICC replaced  STUDIES:  1/5 CT head >> mild atrophy with patchy small vessel disease, ?aneurysm Rt superior to sella  1/5 CT angio head >> no aneurysm, 13 mm mass Rt sella/suprasellar region  1/6 EEG >> normal sleep EEG  1/8 Echo >> mild LVH, EF 55 to 60%, mod LA dilation  1/12 CXR >> Slight improvement in congestive heart failure/interstitial edema  1/14 CXR>> Stable cardiomegaly, minimal interstitial pulmonary edema.  CXR 1/15 PM >> Continued Pulm edema.   LINES / TUBES:  1/5 ETT>>> 1/11  1/7 Rt PICC>>> exchanged over guidewire 1/15   CULTURES:  1/5 BC>>> Neg  1/5 MRSA >> positive   Subjective: More positive today, much better mood  Assessment/Plan:  Seizure - suspected to be due to malignant HTN and DKA and possibly PRESS however due to patient size unable to obtain MRI to confirm PRESS - no AED recommended by Neurology at this time  Malignant hypertension - Blood pressure much better controlled  Altered mental status / acute hypertensive encephalopathy - resolved  DKA - DM (diabetes mellitus), type 2 - DKA now resolved - was on oral meds only prior to admit  - HgA1c 10.5 - adjust tx for improved control   Hypokalemia - replete   Proteus UTI - amp + cefazolin resistant  - oral Levaquin  OSA/ OHS - pt now agrees to wear his home CPAP QHS - will accept this over not wearing anything   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  - 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 per  radiology read - will need MRI dedicated to the sella to better characterize once d/c and able to have "open" MRI as outpt - reportedly pt is unable to fit in our MRI at [facility]  - prolactin levels severely elevated w/ markedly suppressed LH and FSH - initiated cabergoline given size of mass as well as prolactin level  AKI (acute kidney injury) / CKD (chronic kidney disease) stage 3, GFR 30-59 ml/min - Cr slowly improving - follow   Acute respiratory failure - in setting of encephalopathy and seizures - resolved   Hypernatremia - place IV and give 1/2 NS -resolved -encourage PO intake  Morbid obesity  Venous stasis ulcers - stable  Poor venous access -has peripheral IV  Code Status: FULL  Family Communication: spoke w/ pt Disposition Plan: SNF when bed available  Consultants: Neurology  Antibiotics: Acyclovir 1/5>>>1/8 Rocephin 1/18 Cefuroxime 1/19 > 1/20 Levaquin 1/20 >>   DVT prophylaxis: SQ Heparin  Objective: Blood pressure 158/93, pulse 65, temperature 98.3 F (36.8 C), temperature source Oral, resp. rate 18, height 6' (1.829 m), weight 175 kg (385 lb 12.9 oz), SpO2 97.00%.  Intake/Output Summary (Last 24 hours) at 07/10/13 0838 Last data filed at 07/09/13 0849  Gross per 24 hour  Intake      0 ml  Output    200 ml  Net   -200 ml   Exam: General:  no acute respiratory distress, obese male Lungs: Clear to  auscultation bilaterally without wheezes or crackles but breath sounds are very distant Cardiovascular: Regular rate and rhythm without murmur gallop or rub - heart sounds are very distant Abdomen: Morbidly obese, nontender, nondistended, soft, bowel sounds positive, no rebound, no ascites, no appreciable mass Extremities: No significant cyanosis, clubbing;  1+ edema bilateral lower extremities - venous statis ulcers dressed and dry  Data Reviewed: Basic Metabolic Panel:  Recent Labs Lab 07/06/13 0305 07/07/13 0357 07/08/13 0555 07/09/13 0545  07/10/13 0455  NA 149* 149* 150* 144 146  K 3.5* 3.8 3.2* 4.1 3.8  CL 113* 113* 114* 109 109  CO2 24 21 23 22 24   GLUCOSE 207* 198* 143* 169* 210*  BUN 25* 21 18 17 18   CREATININE 2.10* 2.00* 1.88* 1.92* 1.84*  CALCIUM 8.7 8.9 8.8 8.4 8.8    Liver Function Tests: No results found for this basename: AST, ALT, ALKPHOS, BILITOT, PROT, ALBUMIN,  in the last 168 hours  CBC:  Recent Labs Lab 07/04/13 0245 07/06/13 0305 07/09/13 0545 07/10/13 0455  WBC 8.2 7.6 6.0 5.9  HGB 11.6* 11.6* 11.3* 11.1*  HCT 36.1* 36.5* 34.1* 34.4*  MCV 92.8 92.4 89.7 89.4  PLT 333 355 321 379   BNP (last 3 results)  Recent Labs  06/29/13 0330  PROBNP 1004.0*   CBG:  Recent Labs Lab 07/09/13 0628 07/09/13 1142 07/09/13 1702 07/09/13 2105 07/10/13 0627  GLUCAP 155* 226* 157* 130* 175*    Recent Results (from the past 240 hour(s))  URINE CULTURE     Status: None   Collection Time    07/03/13  7:17 PM      Result Value Range Status   Specimen Description URINE, CLEAN CATCH   Final   Special Requests NONE   Final   Culture  Setup Time     Final   Value: 07/04/2013 05:11     Performed at [facility]     Final   Value: >=100,000 COLONIES/ML     Performed at [facility]   Culture     Final   Value: PROTEUS MIRABILIS     Performed at [facility]   Report Status 07/06/2013 FINAL   Final   Organism ID, Bacteria PROTEUS MIRABILIS   Final       Scheduled Meds:  Scheduled Meds: . amLODipine  10 mg Oral Daily  . antiseptic oral rinse  15 mL Mouth Rinse q12n4p  . cabergoline  0.25 mg Oral 2 times weekly  . chlorhexidine  15 mL Mouth Rinse BID  . cloNIDine  0.3 mg Oral TID  . feeding supplement (ENSURE)  1 Container Oral TID BM  . heparin  5,000 Units Subcutaneous Q8H  . hydrALAZINE  75 mg Oral Q8H  . insulin aspart  0-20 Units Subcutaneous TID WC  . insulin aspart  6 Units Subcutaneous TID WC  . insulin glargine  24 Units Subcutaneous  Daily  . labetalol  200 mg Oral BID  . levofloxacin  500 mg Oral Daily  . pantoprazole  40 mg Oral Daily    Time spent on care of this patient: 35 min   [name], [place] [facility]  [phone] Pager - Text Page per Shea Evans as per below:  On-Call/Text Page:      [email or web address]      password [email or web address]  If 7PM-7AM, please contact night-coverage [email or web address] Password [facility] 07/10/2013, 8:38 AM   LOS: 19 days

## 2013-07-11 LAB — GLUCOSE, CAPILLARY
GLUCOSE-CAPILLARY: 107 mg/dL — AB (ref 70–99)
Glucose-Capillary: 135 mg/dL — ABNORMAL HIGH (ref 70–99)
Glucose-Capillary: 128 mg/dL — ABNORMAL HIGH (ref 70–99)
Glucose-Capillary: 113 mg/dL — ABNORMAL HIGH (ref 70–99)

## 2013-07-11 NOTE — Progress Notes (Signed)
Progress Note    Blake Bell GNF:[id number] DOB: 1951-02-12 DOA: 06/21/2013 PCP: [name], MD  Brief narrative: 63 y/o male with h/o HTN, DM, OSA, CKD admitted for seizures and intubated in ER.   SIGNIFICANT EVENTS:  1/5 Admit, Neurology consulted  1/6 Off insulin gtt  1/7 Possible aspiration of tube feeds  1/11 Self extubated, put on BiPAP  1/12 on nicardipine  1/13 WBC trending up, BIPAP improving  1/14 self dc'ed panda, 4hr on/4hrs off BiPAP  1/15 Diet initiated, PICC replaced  STUDIES:  1/5 CT head >> mild atrophy with patchy small vessel disease, ?aneurysm Rt superior to sella  1/5 CT angio head >> no aneurysm, 13 mm mass Rt sella/suprasellar region  1/6 EEG >> normal sleep EEG  1/8 Echo >> mild LVH, EF 55 to 60%, mod LA dilation  1/12 CXR >> Slight improvement in congestive heart failure/interstitial edema  1/14 CXR>> Stable cardiomegaly, minimal interstitial pulmonary edema.  CXR 1/15 PM >> Continued Pulm edema.   LINES / TUBES:  1/5 ETT>>> 1/11  1/7 Rt PICC>>> exchanged over guidewire 1/15   CULTURES:  1/5 BC>>> Neg  1/5 MRSA >> positive   Subjective: More positive today, much better mood  Assessment/Plan:  Seizure - suspected to be due to malignant HTN and DKA and possibly PRESS however due to patient size unable to obtain MRI to confirm PRESS - no AED recommended by Neurology at this time -outpatient MRI will need to be done  Malignant hypertension - Blood pressure much better controlled  Altered mental status / acute hypertensive encephalopathy - resolved  DKA - DM (diabetes mellitus), type 2 - DKA now resolved - was on oral meds only prior to admit  - HgA1c 10.5 - adjust tx for improved control   Hypokalemia - replete   Proteus UTI - amp + cefazolin resistant  -  Levaquin until 1/27  OSA/ OHS - pt now agrees to wear his home CPAP QHS - will accept this over not wearing anything   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  - expansile  right mass noted as 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 per radiology read - will need MRI dedicated to the sella to better characterize once d/c and able to have "open" MRI as outpt - reportedly pt is unable to fit in our MRI at [facility]  - prolactin levels severely elevated w/ markedly suppressed LH and FSH - initiated cabergoline given size of mass as well as prolactin level  AKI (acute kidney injury) / CKD (chronic kidney disease) stage 3, GFR 30-59 ml/min - Cr slowly improving - follow   Acute respiratory failure - in setting of encephalopathy and seizures - resolved   Hypernatremia - place IV and give 1/2 NS -resolved -encourage PO intake  Morbid obesity  Venous stasis ulcers - stable  Poor venous access -has peripheral IV  Code Status: FULL  Family Communication: spoke w/ pt Disposition Plan: SNF when bed available  Consultants: Neurology  Antibiotics: Acyclovir 1/5>>>1/8 Rocephin 1/18 Cefuroxime 1/19 > 1/20 Levaquin 1/20 >>   DVT prophylaxis: SQ Heparin  Objective: Blood pressure 178/99, pulse 65, temperature 97.5 F (36.4 C), temperature source Oral, resp. rate 20, height 6' (1.829 m), weight 175 kg (385 lb 12.9 oz), SpO2 96.00%.  Intake/Output Summary (Last 24 hours) at 07/11/13 0933 Last data filed at 07/11/13 0100  Gross per 24 hour  Intake   1350 ml  Output      0 ml  Net   1350 ml   Exam: General:  no  acute respiratory distress, obese male Lungs: Clear to auscultation bilaterally without wheezes or crackles but breath sounds are very distant Cardiovascular: Regular rate and rhythm without murmur gallop or rub - heart sounds are very distant Abdomen: Morbidly obese, nontender, nondistended, soft, bowel sounds positive, no rebound, no ascites, no appreciable mass Extremities: No significant cyanosis, clubbing;  1+ edema bilateral lower extremities - venous statis ulcers dressed and dry  Data Reviewed: Basic Metabolic Panel:  Recent Labs Lab 07/06/13 0305  07/07/13 0357 07/08/13 0555 07/09/13 0545 07/10/13 0455  NA 149* 149* 150* 144 146  K 3.5* 3.8 3.2* 4.1 3.8  CL 113* 113* 114* 109 109  CO2 24 21 23 22 24   GLUCOSE 207* 198* 143* 169* 210*  BUN 25* 21 18 17 18   CREATININE 2.10* 2.00* 1.88* 1.92* 1.84*  CALCIUM 8.7 8.9 8.8 8.4 8.8    Liver Function Tests: No results found for this basename: AST, ALT, ALKPHOS, BILITOT, PROT, ALBUMIN,  in the last 168 hours  CBC:  Recent Labs Lab 07/06/13 0305 07/09/13 0545 07/10/13 0455  WBC 7.6 6.0 5.9  HGB 11.6* 11.3* 11.1*  HCT 36.5* 34.1* 34.4*  MCV 92.4 89.7 89.4  PLT 355 321 379   BNP (last 3 results)  Recent Labs  06/29/13 0330  PROBNP 1004.0*   CBG:  Recent Labs Lab 07/10/13 0627 07/10/13 1138 07/10/13 1652 07/10/13 2157 07/11/13 0641  GLUCAP 175* 191* 126* 113* 135*    Recent Results (from the past 240 hour(s))  URINE CULTURE     Status: None   Collection Time    07/03/13  7:17 PM      Result Value Range Status   Specimen Description URINE, CLEAN CATCH   Final   Special Requests NONE   Final   Culture  Setup Time     Final   Value: 07/04/2013 05:11     Performed at [place]     Final   Value: >=100,000 COLONIES/ML     Performed at [facility]   Culture     Final   Value: PROTEUS MIRABILIS     Performed at [facility]   Report Status 07/06/2013 FINAL   Final   Organism ID, Bacteria PROTEUS MIRABILIS   Final       Scheduled Meds:  Scheduled Meds: . amLODipine  10 mg Oral Daily  . antiseptic oral rinse  15 mL Mouth Rinse q12n4p  . cabergoline  0.25 mg Oral 2 times weekly  . chlorhexidine  15 mL Mouth Rinse BID  . cloNIDine  0.3 mg Oral TID  . feeding supplement (ENSURE)  1 Container Oral TID BM  . furosemide  40 mg Oral Daily  . heparin  5,000 Units Subcutaneous Q8H  . hydrALAZINE  100 mg Oral Q8H  . insulin aspart  0-20 Units Subcutaneous TID WC  . insulin aspart  6 Units Subcutaneous TID WC  .  insulin glargine  30 Units Subcutaneous Daily  . labetalol  200 mg Oral BID  . levofloxacin  500 mg Oral Daily  . pantoprazole  40 mg Oral Daily    Time spent on care of this patient: 35 min   [name], [place] [facility]  [phone] Pager - Text Page per Shea Evans as per below:  On-Call/Text Page:      [email or web address]      password [email or web address]  If 7PM-7AM, please contact night-coverage [email or web address] Password [email or web address] 07/11/2013, 9:33 AM  LOS: 20 days

## 2013-07-12 LAB — BASIC METABOLIC PANEL
BUN: 14 mg/dL (ref 6–23)
CHLORIDE: 104 meq/L (ref 96–112)
CO2: 23 mEq/L (ref 19–32)
Calcium: 8.6 mg/dL (ref 8.4–10.5)
Creatinine, Ser: 1.78 mg/dL — ABNORMAL HIGH (ref 0.50–1.35)
GFR calc Af Amer: 45 mL/min — ABNORMAL LOW (ref >90)
GFR calc non Af Amer: 39 mL/min — ABNORMAL LOW (ref >90)
GLUCOSE: 105 mg/dL — AB (ref 70–99)
POTASSIUM: 3.5 meq/L — AB (ref 3.7–5.3)
Sodium: 142 mEq/L (ref 137–147)

## 2013-07-12 LAB — CBC
HCT: 35.2 % — ABNORMAL LOW (ref 39.0–52.0)
HEMOGLOBIN: 11.4 g/dL — AB (ref 13.0–17.0)
MCH: 29.2 pg (ref 26.0–34.0)
MCHC: 32.4 g/dL (ref 30.0–36.0)
MCV: 90.3 fL (ref 78.0–100.0)
Platelets: 368 10*3/uL (ref 150–400)
RBC: 3.9 MIL/uL — ABNORMAL LOW (ref 4.22–5.81)
RDW: 17.5 % — ABNORMAL HIGH (ref 11.5–15.5)
WBC: 6.3 10*3/uL (ref 4.0–10.5)

## 2013-07-12 LAB — GLUCOSE, CAPILLARY
GLUCOSE-CAPILLARY: 148 mg/dL — AB (ref 70–99)
Glucose-Capillary: 106 mg/dL — ABNORMAL HIGH (ref 70–99)
Glucose-Capillary: 120 mg/dL — ABNORMAL HIGH (ref 70–99)
Glucose-Capillary: 161 mg/dL — ABNORMAL HIGH (ref 70–99)

## 2013-07-12 MED ORDER — CHLORHEXIDINE GLUCONATE 0.12 % MT SOLN: 15.0000 mL | Freq: Two times a day (BID) | OROMUCOSAL | Status: DC

## 2013-07-12 MED ORDER — INSULIN ASPART 100 UNIT/ML ~~LOC~~ SOLN: 0.0000 [IU] | Freq: Three times a day (TID) | SUBCUTANEOUS | Status: DC

## 2013-07-12 MED ORDER — PANTOPRAZOLE SODIUM 40 MG PO TBEC: 40.0000 mg | DELAYED_RELEASE_TABLET | Freq: Every day | ORAL | Status: DC

## 2013-07-12 MED ORDER — HYDRALAZINE HCL 100 MG PO TABS: 100.0000 mg | ORAL_TABLET | Freq: Three times a day (TID) | ORAL | Status: DC

## 2013-07-12 MED ORDER — CABERGOLINE 0.5 MG PO TABS: 0.2500 mg | ORAL_TABLET | ORAL | Status: DC

## 2013-07-12 MED ORDER — POTASSIUM CHLORIDE CRYS ER 20 MEQ PO TBCR
40.0000 meq | EXTENDED_RELEASE_TABLET | Freq: Once | ORAL | Status: AC
  Administered 2013-07-12: 40 meq via ORAL
  Filled 2013-07-12: qty 2

## 2013-07-12 MED ORDER — INSULIN GLARGINE 100 UNIT/ML ~~LOC~~ SOLN: 30.0000 [IU] | Freq: Every day | SUBCUTANEOUS | Status: DC

## 2013-07-12 MED ORDER — CLONIDINE HCL 0.3 MG PO TABS: 0.3000 mg | ORAL_TABLET | Freq: Three times a day (TID) | ORAL | Status: DC

## 2013-07-12 MED ORDER — ACETAMINOPHEN 325 MG PO TABS: 650.0000 mg | ORAL_TABLET | Freq: Four times a day (QID) | ORAL | Status: DC | PRN

## 2013-07-12 MED ORDER — RESOURCE THICKENUP CLEAR PO POWD: ORAL | Status: DC

## 2013-07-12 MED ORDER — LABETALOL HCL 200 MG PO TABS: 200.0000 mg | ORAL_TABLET | Freq: Two times a day (BID) | ORAL | Status: DC

## 2013-07-12 MED ORDER — INSULIN ASPART 100 UNIT/ML ~~LOC~~ SOLN: 6.0000 [IU] | Freq: Three times a day (TID) | SUBCUTANEOUS | Status: DC

## 2013-07-12 MED ORDER — ENSURE PUDDING PO PUDG: 1.0000 | Freq: Three times a day (TID) | ORAL | Status: DC

## 2013-07-12 MED ORDER — FUROSEMIDE 40 MG PO TABS: 40.0000 mg | ORAL_TABLET | Freq: Every day | ORAL | Status: DC

## 2013-07-12 MED ORDER — BIOTENE DRY MOUTH MT LIQD: 15.0000 mL | Freq: Two times a day (BID) | OROMUCOSAL | Status: DC

## 2013-07-12 MED ORDER — LEVOFLOXACIN 500 MG PO TABS: 500.0000 mg | ORAL_TABLET | Freq: Every day | ORAL | Status: DC

## 2013-07-12 NOTE — Care Management (Signed)
Pt places self on cpap for the night. Explained to him to call this RT if needed. Will monitor

## 2013-07-12 NOTE — Progress Notes (Signed)
Speech Language Pathology Treatment: Dysphagia  Patient Details Name: Blake Bell MRN: [id number] DOB: 11/21/1950 Today's Date: 07/12/2013 Time: 4944-9675 SLP Time Calculation (min): 17 min  Assessment / Plan / Recommendation Clinical Impression  Pt. with significantly improved alertness, awareness and problem solving seen for dysphagia treatment and ability to upgrade texture and liquids.  No s/s penetration or aspiration with thin and functional mastication of solid texture with consistent trials.  He reports a chronic throat clear for "thirty years."  He denies reflux, however SLP explained esophageal precautions.  Recommend upgrade to regular diet texture and thin liquids, pills whole in applesauce, avoid straws for approximately 5-7 day and pt. may benefit from initial assessment with ST at SNF.   HPI HPI: 63 yo male with altered mental status and seizures. Intubated for airway protection. BP in ER 214/148. PCCM asked to admit to ICU. Self extubated 06/27/13. Requires 6L O2 to maintain oxygenation. Very lethargic since extubation and difficult to arouse/assess. Follows some commands for nursing. On and off BiPAP every 4 hours. Pt has been seen for swallow evaluation and has undergone FEES, placed on puree/honey thick diet.  SLP to see pt to assess tolerance of po diet.     Pertinent Vitals WDL  SLP Plan  Continue with current plan of care (until discharge)    Recommendations Diet recommendations: Regular;Thin liquid Liquids provided via: Cup;No straw Medication Administration: Whole meds with puree Supervision: Patient able to self feed;Intermittent supervision to cue for compensatory strategies Compensations: Slow rate;Small sips/bites Postural Changes and/or Swallow Maneuvers: Out of bed for meals;Seated upright 90 degrees;Upright 30-60 min after meal              Oral Care Recommendations: Oral care BID Follow up Recommendations: Skilled Nursing facility Plan: Continue with  current plan of care (until discharge)    GO     Houston Siren M.Ed [employer] [phone]  07/12/2013

## 2013-07-12 NOTE — Progress Notes (Signed)
Physical Therapy Treatment Patient Details Name: Blake Bell MRN: [id number] DOB: Feb 02, 1951 Today's Date: 07/12/2013 Time: 4696-2952 PT Time Calculation (min): 12 min  PT Assessment / Plan / Recommendation  History of Present Illness  63 yo male with altered mental status and seizures. Intubated for airway protection. BP in ER 214/148. PCCM asked to admit to ICU. Self extubated 06/27/13. Requires 6L O2 to maintain oxygenation. Very lethargic since extubation and difficult to arouse/assess.    PT Comments   Patient making gains with mobility and gait.  Agree with need for SNF at discharge.  Follow Up Recommendations  SNF     Does the patient have the potential to tolerate intense rehabilitation     Barriers to Discharge        Equipment Recommendations  Rolling walker with 5" wheels (Bariatric)    Recommendations for Other Services    Frequency Min 4X/week   Progress towards PT Goals Progress towards PT goals: Progressing toward goals  Plan Discharge plan needs to be updated;Equipment recommendations need to be updated    Precautions / Restrictions Precautions Precautions: Fall Precaution Comments: Pt 385 pounds Restrictions Weight Bearing Restrictions: No   Pertinent Vitals/Pain     Mobility  Transfers Overall transfer level: Needs assistance Equipment used: Rolling walker (2 wheeled) Transfers: Sit to/from Stand Sit to Stand: Min assist;+2 safety/equipment General transfer comment: Patient able to scoot to edge of chair.  Assist for balance/steadiness.  Patient able to rise to standing with correct hand placement on armrests.  Patient required verbal cues for hand placement and to move in controlled manner to return to sitting (fatigue). Ambulation/Gait Ambulation/Gait assistance: Min assist;+2 safety/equipment Ambulation Distance (Feet): 60 Feet Assistive device: Rolling walker (2 wheeled) Gait Pattern/deviations: Step-through pattern;Decreased step length -  right;Decreased step length - left;Trunk flexed;Wide base of support (Lateral flexion of trunk to both sides) Gait velocity: decreased General Gait Details: Verbal cues for safe use of RW.  Patient fatigued quickly during gait.      PT Goals (current goals can now be found in the care plan section)    Visit Information  Last PT Received On: 07/12/13 Assistance Needed: +2 History of Present Illness:  63 yo male with altered mental status and seizures. Intubated for airway protection. BP in ER 214/148. PCCM asked to admit to ICU. Self extubated 06/27/13. Requires 6L O2 to maintain oxygenation. Very lethargic since extubation and difficult to arouse/assess.     Subjective Data  Subjective: "That felt good to be on my feet."   Cognition  Cognition Arousal/Alertness: Awake/alert Behavior During Therapy: WFL for tasks assessed/performed Overall Cognitive Status: Within Functional Limits for tasks assessed    Balance  Balance Overall balance assessment: Needs assistance Standing balance support: Single extremity supported Standing balance-Leahy Scale: Fair  End of Session PT - End of Session Equipment Utilized During Treatment: Gait belt Activity Tolerance: Patient limited by fatigue Patient left: in chair;with call bell/phone within reach;with family/visitor present Nurse Communication: Mobility status   GP     Despina Pole 07/12/2013, 4:51 PM Carita Pian. Sanjuana Kava, [place] Pager [phone]

## 2013-07-12 NOTE — Clinical Social Work Note (Signed)
CSW spoke to pt's son, Junior, regarding pt's discharge from [facility]. Junior stated that family would like for pt to be placed at [facility]. CSW spoke to admissions coordinator at [facility] whom stated that their facility had a bed available on 07/12/2013 for pt. CSW has faxed discharge paperwork (summary and AVS) to [facility]. CSW to complete discharge packet and place on pt's shadow chart. Junior to call and speak to pt's RN and let pt's wife know about discharge on 07/12/2013. Junior stated he would ask pt's RN about possible personal vehicle transportation. CSW to complete ambulance form in the event that pt will need ambulance transportation.  RN please call report to [phone] [facility] (ambulance) [place], [place] Worker [phone]

## 2013-07-12 NOTE — Discharge Summary (Addendum)
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 Cbc, bmp 1 week--- may need daily K+ replacement Monitor blood sugars CPAP at night (patient has settings) D/c levaquin 1/27 Dressings- dry on LE  Discharge Diagnoses:  Principal Problem:   Seizure Active Problems:   OSA (obstructive sleep apnea)   Altered mental status   DKA (diabetic ketoacidoses)   Hypercarbia   Malignant hypertension   Sellar or suprasellar mass   CKD (chronic kidney disease) stage 3, GFR 30-59 ml/min   Acute respiratory failure   AKI (acute kidney injury)   Hypernatremia   Morbid obesity   Venous stasis ulcers   DM (diabetes mellitus), type 2   Discharge Condition: improved  Diet recommendation:diabetic diet  Filed Weights   06/30/13 0500 07/01/13 0435 07/02/13 0453  Weight: 179 kg (394 lb 10 oz) 175 kg (385 lb 12.9 oz) 175 kg (385 lb 12.9 oz)    History of present illness:  62 limited history available, HTN, was in normal state health in am , called family, described not feeling well. Then hours later, found unresponsive in car. To ER [facility], combative, seizures x 2, ETT placed. CT head neg aneurysm, HTN noted. No fevers. No neck pain, no headache reported. Limited history however.      Hospital Course:  Seizure  - suspected to be due to malignant HTN and DKA and possibly PRESS however due to patient size unable to obtain MRI to confirm PRESS  - no AED recommended by Neurology at this time  -outpatient MRI will need to be done - dedicated to sella  Malignant hypertension  - will need further titration  Altered mental status / acute hypertensive encephalopathy  - resolved    DKA - DM (diabetes mellitus), type 2  - DKA now resolved - was  on oral meds only prior to admit  - HgA1c 10.5  - adjust tx for improved control   Hypokalemia  - replete   Proteus UTI - amp + cefazolin resistant  - Levaquin until 1/27   OSA/ OHS  - pt now agrees to wear his home CPAP QHS - will accept this over not wearing anything   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  - 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 per radiology read  - will need MRI dedicated to the sella to better characterize once d/c and able to have "open" MRI as outpt - reportedly pt is unable to fit in our MRI at [facility]  - prolactin levels severely elevated w/ markedly suppressed LH and FSH - initiated cabergoline given size of mass as well as prolactin level   CKD (chronic kidney disease) stage 3, GFR 30-59 ml/min  - Cr slowly improving - follow   Acute respiratory failure  - in setting of encephalopathy and seizures - resolved   Hypernatremia  - resolved  Morbid obesity   Venous stasis ulcers  - stable  -dressing changes     Procedures: 1/5 CT head >> mild atrophy with patchy small vessel disease, ?aneurysm Rt superior to sella  1/5 CT angio head >> no aneurysm, 13 mm mass Rt sella/suprasellar region  1/6 EEG >> normal sleep EEG  1/8 Echo >> mild LVH, EF 55 to 60%, mod LA dilation  1/12 CXR >> Slight improvement in congestive heart failure/interstitial edema  1/14 CXR>> Stable cardiomegaly, minimal interstitial pulmonary edema.  CXR 1/15 PM >> Continued Pulm edema  Consultations:  WOC  neuro  Discharge Exam: Filed Vitals:   07/12/13 1009  BP: 148/87  Pulse: 66  Temp: 97.5 F (36.4 C)  Resp: 20    General: A+Ox3, NAD Cardiovascular: rrr Respiratory:  Clear anterior; diminished  Discharge Instructions      Discharge Orders   Future Appointments Provider Department Dept Phone   11/29/2013 9:00 AM Barbaraann Share, MD [facility] [phone]   Future Orders Complete By Expires   Diet Carb Modified  As directed    Discharge  instructions  As directed    Comments:     MRI brain dedicated to the sella for 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 Cbc, bmp 1 week Monitor blood sugars Diabetic diet CPAP at night (patient has settings)   Increase activity slowly  As directed        Medication List    STOP taking these medications       carvedilol 6.25 MG tablet  Commonly known as:  COREG     enalapril 20 MG tablet  Commonly known as:  VASOTEC     glyBURIDE-metformin 2.5-500 MG per tablet  Commonly known as:  GLUCOVANCE     losartan 100 MG tablet  Commonly known as:  COZAAR     penicillin v potassium 500 MG tablet  Commonly known as:  VEETID     potassium chloride SA 20 MEQ tablet  Commonly known as:  K-DUR,KLOR-CON     spironolactone 50 MG tablet  Commonly known as:  ALDACTONE      TAKE these medications       acetaminophen 325 MG tablet  Commonly known as:  TYLENOL  Take 2 tablets (650 mg total) by mouth every 6 (six) hours as needed for mild pain or headache.     amLODipine 10 MG tablet  Commonly known as:  NORVASC  Take 10 mg by mouth daily.     antiseptic oral rinse Liqd  15 mLs by Mouth Rinse route 2 times daily at 12 noon and 4 pm.     cabergoline 0.5 MG tablet  Commonly known as:  DOSTINEX  Take 0.5 tablets (0.25 mg total) by mouth 2 (two) times a week.     chlorhexidine 0.12 % solution  Commonly known as:  PERIDEX  15 mLs by Mouth Rinse route 2 (two) times daily.     cloNIDine 0.3 MG tablet  Commonly known as:  CATAPRES  Take 1 tablet (0.3 mg total) by mouth 3 (three) times daily.     feeding supplement (ENSURE) Pudg  Take 1 Container by mouth 3 (three) times daily between meals.     furosemide 40 MG tablet  Commonly known as:  LASIX  Take 1 tablet (40 mg total) by mouth daily.     hydrALAZINE 100 MG tablet  Commonly known as:  APRESOLINE  Take 1 tablet (100 mg total) by mouth every 8 (eight) hours.     insulin aspart 100 UNIT/ML injection  Commonly known as:  novoLOG   Inject 0-20 Units into the skin 3 (three) times daily with meals.     insulin aspart 100 UNIT/ML injection  Commonly known as:  novoLOG  Inject 6 Units into the skin 3 (three) times daily with meals.     insulin glargine 100 UNIT/ML injection  Commonly known as:  LANTUS  Inject 0.3 mLs (30 Units total) into the skin daily.  labetalol 200 MG tablet  Commonly known as:  NORMODYNE  Take 1 tablet (200 mg total) by mouth 2 (two) times daily.     levofloxacin 500 MG tablet  Commonly known as:  LEVAQUIN  Take 1 tablet (500 mg total) by mouth daily.     pantoprazole 40 MG tablet  Commonly known as:  PROTONIX  Take 1 tablet (40 mg total) by mouth daily.     RESOURCE THICKENUP CLEAR Powd  PRN       No Known Allergies    The results of significant diagnostics from this hospitalization (including imaging, microbiology, ancillary and laboratory) are listed below for reference.    Significant Diagnostic Studies: Ct Angio Head W/cm &/or Wo Cm  06/21/2013   CLINICAL DATA:  Unresponsive, intubated.  Possible aneurysm.  EXAM: CT ANGIOGRAPHY HEAD  TECHNIQUE: Multidetector CT imaging of the head was performed using the standard protocol during bolus administration of intravenous contrast. Multiplanar CT image reconstructions including MIPs were obtained to evaluate the vascular anatomy.  CONTRAST:  166mL OMNIPAQUE IOHEXOL 350 MG/ML SOLN  COMPARISON:  CT of the head June 21, 2013 at 2015 hr.  FINDINGS: Anterior circulation: Normal appearance of the included cervical internal carotid artery, the petrous, cavernous and supra clinoid internal carotid arteries. 2 mm calcified plaque along the undersurface of right cavernous carotid artery. Additional trace calcific atherosclerosis of the carotid siphons. The right carotid terminus is anatomically superior in positioning to the left, which could account for noncontrast CT appearance. Normal appearance of the anterior and middle cerebral arteries,  including distal segments. Mild dolichoectasia may reflect chronic hypertension.  Posterior circulation: Right vertebral artery is dominant, mild irregularity of the left vertebral artery with up to 50% narrowing, however the left vertebral artery predominantly terminates in the left posterior inferior cerebellar artery. Basilar artery and main branch vessels are unremarkable. Robust right posterior communicating artery present. Normal appearance of the posterior cerebral arteries.  No large vessel occlusion, aneurysm, high-grade stenosis.  Expansile sellar/ suprasellar mass intermediate density (with 1-2 mm suprasellar extent) measures at least 13 x 13 mm, scalloping and expanding the sella turcica, without bony erosion. Review of the MIP images confirms the above findings.  IMPRESSION: No aneurysm. Dolicoectatic appearance of the intracranial vessels favors chronic hypertension. Mild irregularity of the left vertebral artery may reflect atherosclerosis though, left vertebral artery predominately terminates in the left PICA.  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   Electronically Signed   By: Elon Alas   On: 06/21/2013 23:38   Ct Head Wo Contrast  06/21/2013   CLINICAL DATA:  Altered mental status  EXAM: CT HEAD WITHOUT CONTRAST  TECHNIQUE: Contiguous axial images were obtained from the base of the skull through the vertex without intravenous contrast. Study was obtained within 24 hr of patient's arrival at the emergency department.  COMPARISON:  None.  FINDINGS: There is mild diffuse atrophy. There is no mass, hemorrhage, extra-axial fluid collection, or midline shift. There is mild patchy small vessel disease in the centra semiovale bilaterally. Elsewhere gray-white compartments appear normal. There is no demonstrable acute infarct.  There is a questionable aneurysm arising on  the right just superior to the sella measuring 9 by 7 mm. It is best seen on axial slice 15.  Bony calvarium appears intact.  The mastoid air cells are clear.  IMPRESSION: Mild atrophy with patchy small vessel disease  in the centra semiovale bilaterally. No intracranial mass, hemorrhage, or acute appearing infarct. Suspect aneurysm on the right just superior to the sella ; CT or MR angiography to further assess would be advisable.   Electronically Signed   By: Lowella Grip M.D.   On: 06/21/2013 20:26   Dg Chest Port 1 View  07/01/2013   CLINICAL DATA:  Bedside PICC placement.  EXAM: PORTABLE CHEST - 1 VIEW  COMPARISON:  Portable examinations yesterday dating back to 06/26/2013.  FINDINGS: Right arm PICC terminates in the right subclavian vein.  Cardiac silhouette moderately enlarged but stable. Pulmonary venous hypertension with minimal residual interstitial edema, unchanged. No new pulmonary parenchymal abnormalities.  IMPRESSION: 1. Right arm PICC terminates in the right subclavian vein. 2. Stable cardiomegaly and minimal interstitial pulmonary edema. No new abnormalities.   Electronically Signed   By: Evangeline Dakin M.D.   On: 07/01/2013 00:14   Dg Chest Port 1 View  06/29/2013   CLINICAL DATA:  Respiratory failure .  EXAM: PORTABLE CHEST - 1 VIEW  COMPARISON:  06/28/2013.  FINDINGS: Central line in stable position. Persistent cardiomegaly with pulmonary vascular prominence remains. Interstitial pulmonary edema has improved from prior exam. No pleural effusion or pneumothorax. No acute bony abnormality.  IMPRESSION: Slight improvement in congestive heart failure or interstitial edema .   Electronically Signed   By: Marcello Moores  Register   On: 06/29/2013 07:44   Dg Chest Port 1 View  06/28/2013   CLINICAL DATA:  Pneumonia  EXAM: PORTABLE CHEST - 1 VIEW  COMPARISON:  June 27, 2013  FINDINGS: The lungs are borderline hypoinflated. The interstitial markings are diffusely increased but not significantly  changed since yesterday's study. The cardiopericardial silhouette remains enlarged. The pulmonary vascularity remains prominent centrally and in the upper lobes. A right-sided subclavian venous catheter has its tip in the region of the distal portion of the SVC. The esophagogastric tube and endotracheal tube have been withdrawn.  IMPRESSION: The lungs are slightly better inflated today. The interstitial markings remain increased bilaterally consistent with pneumonia or other interstitial process. Confluent lung markings in the right infrahilar region may reflect infiltrate or subsegmental atelectasis.   Electronically Signed   By: David  Martinique   On: 06/28/2013 07:39   Dg Chest Port 1 View  06/27/2013   CLINICAL DATA:  CHF  EXAM: PORTABLE CHEST - 1 VIEW  COMPARISON:  06/26/2013  FINDINGS: Endotracheal tube is at the thoracic inlet 9.6 cm above the carina. Right upper extremity PICC line tip at the proximal right atrial level. NG tube is at the GE junction and could be advanced 5 cm into the stomach.  Heart remains enlarged with low lung volumes and mild diffuse interstitial edema. Scattered perihilar and bibasilar atelectasis. No enlarging effusion or pneumothorax. Slight interval improvement in aeration.  IMPRESSION: Minimal improvement in mild edema pattern/CHF  Low lung volumes persist with atelectasis   Electronically Signed   By: Daryll Brod M.D.   On: 06/27/2013 07:19   Dg Chest Port 1 View  06/26/2013   CLINICAL DATA:  Evaluate endotracheal placement.  EXAM: PORTABLE CHEST - 1 VIEW  COMPARISON:  06/25/2013  FINDINGS: The endotracheal tube is 7 cm above the carina and right at the thoracic inlet. The NG tube is coursing down the esophagus and into the stomach. The heart is enlarged but stable. There is persistent vascular congestion and interstitial edema without obvious pleural effusion or pneumothorax.  IMPRESSION: The endotracheal tube is 7 cm above the carina. The  NG tube and right PICC line are  stable.  Persistent discussed that stable cardiac enlargement and pulmonary edema and areas of atelectasis.   Electronically Signed   By: Kalman Jewels M.D.   On: 06/26/2013 08:28   Dg Chest Port 1 View  06/25/2013   CLINICAL DATA:  CHF.  Hypertension.  Diabetes.  Morbid obesity.  EXAM: PORTABLE CHEST - 1 VIEW  COMPARISON:  DG CHEST 1V PORT dated 06/24/2013; DG CHEST 1V PORT dated 06/23/2013  FINDINGS: Endotracheal tube 4.5 cm above carina. Nasogastric tube poorly visualized distally but likely extends beyond the inferior aspect of the film. Right-sided PICC line terminates at low SVC.  Cardiomegaly accentuated by AP portable technique. Cannot exclude small left pleural effusion. No pneumothorax. Low lung volumes. Slight improvement in moderate interstitial edema. Persistent left base airspace disease.  IMPRESSION: Low lung volumes with improved moderate interstitial edema.  Probable left base atelectasis and adjacent small left pleural effusion.   Electronically Signed   By: Abigail Miyamoto M.D.   On: 06/25/2013 07:29   Dg Chest Port 1 View  06/24/2013   CLINICAL DATA:  Evaluate pulmonary infiltrates. Hypertension. Diabetes. Sleep apnea. Morbid obesity.  EXAM: PORTABLE CHEST - 1 VIEW  COMPARISON:  DG CHEST 1V PORT dated 06/23/2013; DG CHEST 1V PORT dated 06/21/2013  FINDINGS: Endotracheal tube terminates 4.4 cm above carina. Nasogastric tube poorly visualized distally but likely extends beyond the inferior aspect of the film.  Cardiomegaly accentuated by AP portable technique. No right and no definite left pleural effusion. Inferior left hemi thorax poorly evaluated. Low lung volumes. Similar concurrent mild to moderate interstitial edema. Patchy bibasilar airspace disease which is not significantly changed.  IMPRESSION: No significant change since the prior exam.  Interstitial edema, superimposed upon low lung volumes.  Bibasilar Airspace disease, likely atelectasis.   Electronically Signed   By: Abigail Miyamoto M.D.    On: 06/24/2013 07:49   Dg Chest Port 1 View  06/23/2013   CLINICAL DATA:  Evaluate endotracheal tube placement.  EXAM: PORTABLE CHEST - 1 VIEW  COMPARISON:  Chest x-ray 06/21/2013.  FINDINGS: An endotracheal tube is in place with tip 2.2 cm above the carina. A nasogastric tube is seen extending into the stomach, however, the tip of the nasogastric tube extends below the lower margin of the image. Lung volumes are low. Bibasilar opacities (left greater than right) may reflect areas of atelectasis and/or consolidation. Small left pleural effusion. There is cephalization of the pulmonary vasculature, indistinctness of the interstitial markings, and patchy airspace disease throughout the lungs bilaterally suggestive of moderate pulmonary edema. Mild cardiomegaly. The patient is rotated to the left on today's exam, resulting in distortion of the mediastinal contours and reduced diagnostic sensitivity and specificity for mediastinal pathology.  IMPRESSION: 1. Support apparatus, as above. 2. The appearance of the chest suggests worsening congestive heart failure, as above. 3. Low lung volumes with bibasilar atelectasis and/or consolidation. 4. Small left pleural effusion.   Electronically Signed   By: Vinnie Langton M.D.   On: 06/23/2013 08:05   Dg Chest Port 1 View  06/21/2013   CLINICAL DATA:  Status post intubation.  EXAM: PORTABLE CHEST - 1 VIEW  COMPARISON:  06/21/2013  FINDINGS: Endotracheal tube extends into the left mainstem bronchus. There are patchy lung densities which probably represent edema. Heart size is enlarged. No evidence for a pneumothorax.  IMPRESSION: Endotracheal tube in the left mainstem bronchus.  Patchy interstitial densities suggest edema.  Cardiomegaly.  These results were called  by telephone at the time of interpretation on 06/21/2013 at 8:40 PM to Dr. Shea Evans, Aline Brochure , who verbally acknowledged these results.   Electronically Signed   By: Markus Daft M.D.   On: 06/21/2013 20:41   Dg  Chest Port 1 View  06/21/2013   CLINICAL DATA:  Chest pain.  EXAM: PORTABLE CHEST - 1 VIEW  COMPARISON:  04/12/2010  FINDINGS: There are diffuse densities on both sides of the chest. Slightly low lung volumes. Heart size is upper limits of normal and likely accentuated by the technique.  IMPRESSION: Bilateral lung densities. Lung densities may be related to low lung volumes and technique but cannot exclude pulmonary edema.   Electronically Signed   By: Markus Daft M.D.   On: 06/21/2013 18:56   Dg Chest Port 1v Same Day  07/01/2013   CLINICAL DATA:  Hypertension.  Possible pulmonary edema.  EXAM: PORTABLE CHEST - 1 VIEW SAME DAY  COMPARISON:  06/30/2013.  FINDINGS: Cardiomegaly with mild pulmonary vascular prominence interstitial prominence noted. Mild component of congestive heart failure should be considered. PICC line noted in good anatomic position. No focal alveolar infiltrates. No pleural effusion or pneumothorax.  IMPRESSION: 1. Findings suggesting mild congestive heart failure with pulmonary interstitial edema .  2. PICC line in good anatomic position.   Electronically Signed   By: Marcello Moores  Register   On: 07/01/2013 14:38   Dg Abd Portable 1v  06/29/2013   CLINICAL DATA:  panda placement  EXAM: PORTABLE ABDOMEN - 1 VIEW  COMPARISON:  None.  FINDINGS: Feeding tube is identified with tip below the diaphragm in the left upper quadrant.  IMPRESSION: Feeding tube with tip in anticipated position of the stomach.   Electronically Signed   By: Skipper Cliche M.D.   On: 06/29/2013 16:27    Microbiology: Recent Results (from the past 240 hour(s))  URINE CULTURE     Status: None   Collection Time    07/03/13  7:17 PM      Result Value Range Status   Specimen Description URINE, CLEAN CATCH   Final   Special Requests NONE   Final   Culture  Setup Time     Final   Value: 07/04/2013 05:11     Performed at [facility]     Final   Value: >=100,000 COLONIES/ML     Performed at  [facility]   Culture     Final   Value: PROTEUS MIRABILIS     Performed at [facility]   Report Status 07/06/2013 FINAL   Final   Organism ID, Bacteria PROTEUS MIRABILIS   Final     Labs: Basic Metabolic Panel:  Recent Labs Lab 07/07/13 0357 07/08/13 0555 07/09/13 0545 07/10/13 0455 07/12/13 0634  NA 149* 150* 144 146 142  K 3.8 3.2* 4.1 3.8 3.5*  CL 113* 114* 109 109 104  CO2 21 23 22 24 23   GLUCOSE 198* 143* 169* 210* 105*  BUN 21 18 17 18 14   CREATININE 2.00* 1.88* 1.92* 1.84* 1.78*  CALCIUM 8.9 8.8 8.4 8.8 8.6   Liver Function Tests: No results found for this basename: AST, ALT, ALKPHOS, BILITOT, PROT, ALBUMIN,  in the last 168 hours No results found for this basename: LIPASE, AMYLASE,  in the last 168 hours No results found for this basename: AMMONIA,  in the last 168 hours CBC:  Recent Labs Lab 07/06/13 0305 07/09/13 0545 07/10/13 0455 07/12/13 0634  WBC 7.6  6.0 5.9 6.3  HGB 11.6* 11.3* 11.1* 11.4*  HCT 36.5* 34.1* 34.4* 35.2*  MCV 92.4 89.7 89.4 90.3  PLT 355 321 379 368   Cardiac Enzymes: No results found for this basename: CKTOTAL, CKMB, CKMBINDEX, TROPONINI,  in the last 168 hours BNP: BNP (last 3 results)  Recent Labs  06/29/13 0330  PROBNP 1004.0*   CBG:  Recent Labs Lab 07/11/13 0641 07/11/13 1122 07/11/13 1638 07/11/13 2219 07/12/13 0630  GLUCAP 135* 128* 113* 107* 106*       Signed:  Lillee Mooneyhan  [facility] 07/12/2013, 10:40 AM

## 2013-07-13 LAB — GLUCOSE, CAPILLARY
Glucose-Capillary: 127 mg/dL — ABNORMAL HIGH (ref 70–99)
Glucose-Capillary: 95 mg/dL (ref 70–99)
Glucose-Capillary: 84 mg/dL (ref 70–99)

## 2013-07-13 NOTE — Progress Notes (Signed)
Occupational Therapy Treatment Patient Details Name: Blake Bell MRN: [id number] DOB: May 03, 1951 Today's Date: 07/13/2013 Time: 9983-3825 OT Time Calculation (min): 25 min  OT Assessment / Plan / Recommendation  History of present illness  63 yo male with altered mental status and seizures. Intubated for airway protection. BP in ER 214/148. PCCM asked to admit to ICU. Self extubated 06/27/13. Requires 6L O2 to maintain oxygenation. Very lethargic since extubation and difficult to arouse/assess.    OT comments  Pt. Awake and had just finished with PT but very eager and motivated for participation in skilled ot.  Able to complete several LB dressing tasks with out use of A/E with min guard a.  Spouse educated and encouraged to allow pt. To attempt all tasks prior to her offering assistance as he is very capable. She agreed. Both were visibly happy that he was able to assist with his LB tasks.  Follow Up Recommendations  SNF                          Progress towards OT Goals Progress towards OT goals: Progressing toward goals  Plan Discharge plan needs to be updated;Other (comment) (pts. plan is for snf, pending ins. approval)    Precautions / Restrictions Contact precautions, MRSA, fall precautions  Pertinent Vitals/Pain No c/o pain    ADL  Lower Body Dressing: Performed;Min guard Where Assessed - Lower Body Dressing: Supported sit to stand Toileting - Water quality scientist and Hygiene: Simulated;Minimal assistance Where Assessed - Best boy and Hygiene: Standing ADL Comments: pt. able to don/doff shoes and undergarments with min guard a sit/stand.  educated pt. and wife on allowing pt. to attempt prior to wife assisting as she is used to "jumping in to help".  pt. very pleased with his ability to reach forward to feet to adjust shoes. also with donning and pulling up underwear        OT Goals(current goals can now be found in the care plan section)     Visit Information  Last OT Received On: 07/13/13 Assistance Needed: +2 (for safety) History of Present Illness:  63 yo male with altered mental status and seizures. Intubated for airway protection. BP in ER 214/148. PCCM asked to admit to ICU. Self extubated 06/27/13. Requires 6L O2 to maintain oxygenation. Very lethargic since extubation and difficult to arouse/assess.     Subjective Data   "i like to be active i am really going to work hard to get better"          Cognition  Cognition Arousal/Alertness: Awake/alert Behavior During Therapy: WFL for tasks assessed/performed Overall Cognitive Status: Within Functional Limits for tasks assessed    Mobility  Transfers Overall transfer level: Needs assistance Equipment used: Rolling walker (2 wheeled) Transfers: Sit to/from Stand Sit to Stand: Min assist General transfer comment: good demonstration of tech. and sequencing for reaching back prior to sitting down.  very aware of rw and not leaning or pushing up/down from it              End of Session OT - End of Session Equipment Utilized During Treatment: Rolling walker Activity Tolerance: Patient tolerated treatment well Patient left: in chair;with call bell/phone within reach;with chair alarm set;with family/visitor present       Janice Coffin, COTA/L 07/13/2013, 2:52 PM

## 2013-07-13 NOTE — Progress Notes (Signed)
NUTRITION FOLLOW UP  Intervention:   Discontinue Ensure Pudding po TID, each supplement provides 170 kcal and 4 grams of protein.  Nutrition Dx:   Inadequate oral intake related to inability to eat as evidenced by NPO status; discontinued  Goal:  Pt to meet >/= 90% of their estimated nutrition needs; being met Monitor:  Diet advancement, PO intake, weight trend, labs   Assessment:   Pt admitted after being found locked in his car. Pt unresponsive and having seizures. 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   Pt self extubated 1/11. Pt pulled feeding tube 1/14.  Pt moves around in the bed and is unintentional about pulling lines. Per RN, pt fell over weekend. Now has a Actuary.  1/19: Dysphagia 1 Pudding Thick diet started 1/15 with minimal PO's consumed. SLP completed FEES 1/16, pt noted to have cognitive deficits that are significantly impacting his ability to safely consume any PO's. Pt with recommendations for NPO status, however pt has remained on Dysphagia 1 diet with Pudding Thickened Liquids since 1/15. Pt consumed 100% breakfast this morning. RN reports that pt's mental status continues to wax and wane. Sometimes takes quite a bit of time to administer PO meds. 1/27: Pt's diet advanced to regular diet, thin liquids 1/26. Pt is eating 100% of meals and is receiving Ensure Pudding TID. No new weights. Pt has discharge orders but, held due to insurance issues. Pt morbidly obese and eating well now- d/c supplements.  Height: Ht Readings from Last 1 Encounters:  06/21/13 6' (1.829 m)    Weight Status:   Wt Readings from Last 1 Encounters:  07/02/13 385 lb 12.9 oz (175 kg)  Usual weight 392-395 lb (179.5 kg)  Re-estimated needs:  Kcal: 2100-2300 Protein: 120-140 grams Fluid: >2 L/day  Skin: +2 generalized edema  Diet Order: General    Intake/Output Summary (Last 24 hours) at 07/13/13 1538 Last data filed at  07/13/13 1300  Gross per 24 hour  Intake   1200 ml  Output      0 ml  Net   1200 ml    Last BM: 1/26  Labs:   Recent Labs Lab 07/09/13 0545 07/10/13 0455 07/12/13 0634  NA 144 146 142  K 4.1 3.8 3.5*  CL 109 109 104  CO2 _0 BUN _1 CREATININE 1.92* 1.84* 1.78*  CALCIUM 8.4 8.8 8.6  GLUCOSE 169* 210* 105*    CBG (last 3)   Recent Labs  07/12/13 2239 07/13/13 0636 07/13/13 1136  GLUCAP 161* 127* 95   Lab Results  Component Value Date   HGBA1C 10.5* 07/04/2013   Scheduled Meds: . amLODipine  10 mg Oral Daily  . antiseptic oral rinse  15 mL Mouth Rinse q12n4p  . cabergoline  0.25 mg Oral 2 times weekly  . chlorhexidine  15 mL Mouth Rinse BID  . cloNIDine  0.3 mg Oral TID  . feeding supplement (ENSURE)  1 Container Oral TID BM  . furosemide  40 mg Oral Daily  . heparin  5,000 Units Subcutaneous Q8H  . hydrALAZINE  100 mg Oral Q8H  . insulin aspart  0-20 Units Subcutaneous TID WC  . insulin aspart  6 Units Subcutaneous TID WC  . insulin glargine  30 Units Subcutaneous Daily  . labetalol  200 mg Oral BID  . pantoprazole  40 mg Oral Daily    Continuous Infusions:    Pryor Ochoa RD, LDN  Inpatient Clinical Dietitian Pager: [phone] After Hours Pager: [phone]

## 2013-07-13 NOTE — Clinical Social Work Note (Signed)
CSW spoke with Barnett Applebaum at [facility].  [employer] has still not given authorization for admission to SNF.  CSW will continue to f/u.  MD and RN aware.  Nonnie Done, [place] [phone]  Clinical Social Work

## 2013-07-13 NOTE — Clinical Social Work Note (Signed)
CSW received call from [place] at [facility].  SNF has received insurance authorization.  Pt will be dc and transported to SNF via [facility].  RN and MD aware.  RN informed family (wife at bedside).  Report # [id number].    Nonnie Done, [place] [phone]  Clinical Social Work

## 2013-07-13 NOTE — Progress Notes (Signed)
Discharge was held due to insurance issues.  Discharge orders done- patient has bed.  Vitals stable  [name] DO

## 2013-07-13 NOTE — Progress Notes (Signed)
Physical Therapy Treatment Patient Details Name: Trysten Bernard MRN: [id number] DOB: 1951/03/06 Today's Date: 07/13/2013 Time: 1540-0867 PT Time Calculation (min): 17 min  PT Assessment / Plan / Recommendation  History of Present Illness  63 yo male with altered mental status and seizures. Intubated for airway protection. BP in ER 214/148. PCCM asked to admit to ICU. Self extubated 06/27/13. Requires 6L O2 to maintain oxygenation. Very lethargic since extubation and difficult to arouse/assess.    PT Comments   Patient sleeping in recliner upon entering room but easily woken up and was encouraged to walk. Patient progressing well and continues to make good gains with gait and endurance. Patient awaiting to get insurance approval for SNF in order to leave today. Continue with current POC  Follow Up Recommendations  SNF     Does the patient have the potential to tolerate intense rehabilitation     Barriers to Discharge        Equipment Recommendations  Rolling walker with 5" wheels    Recommendations for Other Services    Frequency Min 4X/week   Progress towards PT Goals Progress towards PT goals: Progressing toward goals  Plan Current plan remains appropriate    Precautions / Restrictions Precautions Precautions: Fall Precaution Comments: Pt 385 pounds   Pertinent Vitals/Pain no apparent distress     Mobility  Transfers Equipment used: Rolling walker (2 wheeled) Sit to Stand: Min assist;+2 safety/equipment General transfer comment: Patient with good safe technique. Able to scoot to edge of the chair and rock using momentum to stand.  Ambulation/Gait Ambulation/Gait assistance: Min assist Ambulation Distance (Feet): 90 Feet Assistive device: Rolling walker (2 wheeled) Gait Pattern/deviations: Step-through pattern;Wide base of support;Decreased stride length Gait velocity: increasing General Gait Details: Verbal cues for safe use of RW.  Patient fatigued quickly during  gait.    Exercises     PT Diagnosis:    PT Problem List:   PT Treatment Interventions:     PT Goals (current goals can now be found in the care plan section)    Visit Information  Last PT Received On: 07/13/13 Assistance Needed: +2 (for safety) History of Present Illness:  63 yo male with altered mental status and seizures. Intubated for airway protection. BP in ER 214/148. PCCM asked to admit to ICU. Self extubated 06/27/13. Requires 6L O2 to maintain oxygenation. Very lethargic since extubation and difficult to arouse/assess.     Subjective Data      Cognition  Cognition Arousal/Alertness: Awake/alert Behavior During Therapy: WFL for tasks assessed/performed Overall Cognitive Status: Within Functional Limits for tasks assessed    Balance     End of Session PT - End of Session Equipment Utilized During Treatment: Gait belt Activity Tolerance: Patient tolerated treatment well;Patient limited by fatigue Patient left: in chair;with call bell/phone within reach Nurse Communication: Mobility status   GP     Jacqualyn Posey 07/13/2013, 2:30 PM 07/13/2013 Jacqualyn Posey PTA [phone] pager [phone] office

## 2013-07-13 NOTE — Discharge Summary (Signed)
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 Cbc, bmp 1 week--- may need daily K+ replacement Monitor blood sugars CPAP at night (patient has settings) D/c levaquin 1/27 Dressings- dry on LE  Discharge Diagnoses:  Principal Problem:   Seizure Active Problems:   OSA (obstructive sleep apnea)   Altered mental status   DKA (diabetic ketoacidoses)   Hypercarbia   Malignant hypertension   Sellar or suprasellar mass   CKD (chronic kidney disease) stage 3, GFR 30-59 ml/min   Acute respiratory failure   AKI (acute kidney injury)   Hypernatremia   Morbid obesity   Venous stasis ulcers   DM (diabetes mellitus), type 2   Discharge Condition: improved  Diet recommendation:diabetic diet  Filed Weights   06/30/13 0500 07/01/13 0435 07/02/13 0453  Weight: 179 kg (394 lb 10 oz) 175 kg (385 lb 12.9 oz) 175 kg (385 lb 12.9 oz)    History of present illness:  62 limited history available, HTN, was in normal state health in am , called family, described not feeling well. Then hours later, found unresponsive in car. To ER [facility], combative, seizures x 2, ETT placed. CT head neg aneurysm, HTN noted. No fevers. No neck pain, no headache reported. Limited history however.      Hospital Course:  Seizure  - suspected to be due to malignant HTN and DKA and possibly PRESS however due to patient size unable to obtain MRI to confirm PRESS  - no AED recommended by Neurology at this time  -outpatient MRI will need to be done - dedicated to sella  Malignant hypertension  - will need further titration  Altered mental status / acute hypertensive encephalopathy  - resolved    DKA - DM (diabetes mellitus), type 2  - DKA now resolved - was  on oral meds only prior to admit  - HgA1c 10.5  - adjust tx for improved control   Hypokalemia  - replete   Proteus UTI - amp + cefazolin resistant  - Levaquin until 1/27   OSA/ OHS  - pt now agrees to wear his home CPAP QHS - will accept this over not wearing anything   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  - 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 per radiology read  - will need MRI dedicated to the sella to better characterize once d/c and able to have "open" MRI as outpt - reportedly pt is unable to fit in our MRI at [facility]  - prolactin levels severely elevated w/ markedly suppressed LH and FSH - initiated cabergoline given size of mass as well as prolactin level   CKD (chronic kidney disease) stage 3, GFR 30-59 ml/min  - Cr slowly improving - follow   Acute respiratory failure  - in setting of encephalopathy and seizures - resolved   Hypernatremia  - resolved  Morbid obesity   Venous stasis ulcers  - stable  -dressing changes     Procedures: 1/5 CT head >> mild atrophy with patchy small vessel disease, ?aneurysm Rt superior to sella  1/5 CT angio head >> no aneurysm, 13 mm mass Rt sella/suprasellar region  1/6 EEG >> normal sleep EEG  1/8 Echo >> mild LVH, EF 55 to 60%, mod LA dilation  1/12 CXR >> Slight improvement in congestive heart failure/interstitial edema  1/14 CXR>> Stable cardiomegaly, minimal interstitial pulmonary edema.  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 Cbc, bmp 1 week Monitor blood sugars Diabetic diet CPAP at night (patient has settings)   Increase activity slowly  As directed        Medication List    STOP taking these medications       carvedilol 6.25 MG tablet  Commonly known as:  COREG     enalapril 20 MG tablet  Commonly known as:  VASOTEC     glyBURIDE-metformin 2.5-500 MG per tablet  Commonly known as:  GLUCOVANCE     losartan 100 MG tablet  Commonly known as:  COZAAR     penicillin v potassium 500 MG tablet  Commonly known as:  VEETID     potassium chloride SA 20 MEQ tablet  Commonly known as:  K-DUR,KLOR-CON     spironolactone 50 MG tablet  Commonly known as:  ALDACTONE      TAKE these medications       acetaminophen 325 MG tablet  Commonly known as:  TYLENOL  Take 2 tablets (650 mg total) by mouth every 6 (six) hours as needed for mild pain or headache.     amLODipine 10 MG tablet  Commonly known as:  NORVASC  Take 10 mg by mouth daily.     antiseptic oral rinse Liqd  15 mLs by Mouth Rinse route 2 times daily at 12 noon and 4 pm.     cabergoline 0.5 MG tablet  Commonly known as:  DOSTINEX  Take 0.5 tablets (0.25 mg total) by mouth 2 (two) times a week.     chlorhexidine 0.12 % solution  Commonly known as:  PERIDEX  15 mLs by Mouth Rinse route 2 (two) times daily.     cloNIDine 0.3 MG tablet  Commonly known as:  CATAPRES  Take 1 tablet (0.3 mg total) by mouth 3 (three) times daily.     feeding supplement (ENSURE) Pudg  Take 1 Container by mouth 3 (three) times daily between meals.     furosemide 40 MG tablet  Commonly known as:  LASIX  Take 1 tablet (40 mg total) by mouth daily.     hydrALAZINE 100 MG tablet  Commonly known as:  APRESOLINE  Take 1 tablet (100 mg total) by mouth every 8 (eight) hours.     insulin aspart 100 UNIT/ML injection  Commonly known as:  novoLOG   Inject 0-20 Units into the skin 3 (three) times daily with meals.     insulin aspart 100 UNIT/ML injection  Commonly known as:  novoLOG  Inject 6 Units into the skin 3 (three) times daily with meals.     insulin glargine 100 UNIT/ML injection  Commonly known as:  LANTUS  Inject 0.3 mLs (30 Units total) into the skin daily.  labetalol 200 MG tablet  Commonly known as:  NORMODYNE  Take 1 tablet (200 mg total) by mouth 2 (two) times daily.     levofloxacin 500 MG tablet  Commonly known as:  LEVAQUIN  Take 1 tablet (500 mg total) by mouth daily.     pantoprazole 40 MG tablet  Commonly known as:  PROTONIX  Take 1 tablet (40 mg total) by mouth daily.     RESOURCE THICKENUP CLEAR Powd  PRN       No Known Allergies Follow-up Information   Follow up with [name], MD In 1 week.   Specialty:  Internal Medicine   Contact information:   [address] [place] [address] [phone]        The results of significant diagnostics from this hospitalization (including imaging, microbiology, ancillary and laboratory) are listed below for reference.    Significant Diagnostic Studies: Ct Angio Head W/cm &/or Wo Cm  06/21/2013   CLINICAL DATA:  Unresponsive, intubated.  Possible aneurysm.  EXAM: CT ANGIOGRAPHY HEAD  TECHNIQUE: Multidetector CT imaging of the head was performed using the standard protocol during bolus administration of intravenous contrast. Multiplanar CT image reconstructions including MIPs were obtained to evaluate the vascular anatomy.  CONTRAST:  167mL OMNIPAQUE IOHEXOL 350 MG/ML SOLN  COMPARISON:  CT of the head June 21, 2013 at 2015 hr.  FINDINGS: Anterior circulation: Normal appearance of the included cervical internal carotid artery, the petrous, cavernous and supra clinoid internal carotid arteries. 2 mm calcified plaque along the undersurface of right cavernous carotid artery. Additional trace calcific atherosclerosis of the carotid  siphons. The right carotid terminus is anatomically superior in positioning to the left, which could account for noncontrast CT appearance. Normal appearance of the anterior and middle cerebral arteries, including distal segments. Mild dolichoectasia may reflect chronic hypertension.  Posterior circulation: Right vertebral artery is dominant, mild irregularity of the left vertebral artery with up to 50% narrowing, however the left vertebral artery predominantly terminates in the left posterior inferior cerebellar artery. Basilar artery and main branch vessels are unremarkable. Robust right posterior communicating artery present. Normal appearance of the posterior cerebral arteries.  No large vessel occlusion, aneurysm, high-grade stenosis.  Expansile sellar/ suprasellar mass intermediate density (with 1-2 mm suprasellar extent) measures at least 13 x 13 mm, scalloping and expanding the sella turcica, without bony erosion. Review of the MIP images confirms the above findings.  IMPRESSION: No aneurysm. Dolicoectatic appearance of the intracranial vessels favors chronic hypertension. Mild irregularity of the left vertebral artery may reflect atherosclerosis though, left vertebral artery predominately terminates in the left PICA.  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   Electronically Signed   By: Elon Alas   On: 06/21/2013 23:38   Ct Head Wo Contrast  06/21/2013   CLINICAL DATA:  Altered mental status  EXAM: CT HEAD WITHOUT CONTRAST  TECHNIQUE: Contiguous axial images were obtained from the base of the skull through the vertex without intravenous contrast. Study was obtained within 24 hr of patient's arrival at the emergency department.  COMPARISON:  None.  FINDINGS: There is mild diffuse atrophy. There is no mass, hemorrhage, extra-axial fluid collection, or midline shift. There is  mild patchy small vessel disease in the centra semiovale bilaterally. Elsewhere gray-white compartments appear normal. There is no demonstrable acute infarct.  There is a questionable aneurysm arising on the right just  superior to the sella measuring 9 by 7 mm. It is best seen on axial slice 15.  Bony calvarium appears intact.  The mastoid air cells are clear.  IMPRESSION: Mild atrophy with patchy small vessel disease in the centra semiovale bilaterally. No intracranial mass, hemorrhage, or acute appearing infarct. Suspect aneurysm on the right just superior to the sella ; CT or MR angiography to further assess would be advisable.   Electronically Signed   By: Lowella Grip M.D.   On: 06/21/2013 20:26   Dg Chest Port 1 View  07/01/2013   CLINICAL DATA:  Bedside PICC placement.  EXAM: PORTABLE CHEST - 1 VIEW  COMPARISON:  Portable examinations yesterday dating back to 06/26/2013.  FINDINGS: Right arm PICC terminates in the right subclavian vein.  Cardiac silhouette moderately enlarged but stable. Pulmonary venous hypertension with minimal residual interstitial edema, unchanged. No new pulmonary parenchymal abnormalities.  IMPRESSION: 1. Right arm PICC terminates in the right subclavian vein. 2. Stable cardiomegaly and minimal interstitial pulmonary edema. No new abnormalities.   Electronically Signed   By: Evangeline Dakin M.D.   On: 07/01/2013 00:14   Dg Chest Port 1 View  06/29/2013   CLINICAL DATA:  Respiratory failure .  EXAM: PORTABLE CHEST - 1 VIEW  COMPARISON:  06/28/2013.  FINDINGS: Central line in stable position. Persistent cardiomegaly with pulmonary vascular prominence remains. Interstitial pulmonary edema has improved from prior exam. No pleural effusion or pneumothorax. No acute bony abnormality.  IMPRESSION: Slight improvement in congestive heart failure or interstitial edema .   Electronically Signed   By: Marcello Moores  Register   On: 06/29/2013 07:44   Dg Chest Port 1 View  06/28/2013    CLINICAL DATA:  Pneumonia  EXAM: PORTABLE CHEST - 1 VIEW  COMPARISON:  June 27, 2013  FINDINGS: The lungs are borderline hypoinflated. The interstitial markings are diffusely increased but not significantly changed since yesterday's study. The cardiopericardial silhouette remains enlarged. The pulmonary vascularity remains prominent centrally and in the upper lobes. A right-sided subclavian venous catheter has its tip in the region of the distal portion of the SVC. The esophagogastric tube and endotracheal tube have been withdrawn.  IMPRESSION: The lungs are slightly better inflated today. The interstitial markings remain increased bilaterally consistent with pneumonia or other interstitial process. Confluent lung markings in the right infrahilar region may reflect infiltrate or subsegmental atelectasis.   Electronically Signed   By: David  Martinique   On: 06/28/2013 07:39   Dg Chest Port 1 View  06/27/2013   CLINICAL DATA:  CHF  EXAM: PORTABLE CHEST - 1 VIEW  COMPARISON:  06/26/2013  FINDINGS: Endotracheal tube is at the thoracic inlet 9.6 cm above the carina. Right upper extremity PICC line tip at the proximal right atrial level. NG tube is at the GE junction and could be advanced 5 cm into the stomach.  Heart remains enlarged with low lung volumes and mild diffuse interstitial edema. Scattered perihilar and bibasilar atelectasis. No enlarging effusion or pneumothorax. Slight interval improvement in aeration.  IMPRESSION: Minimal improvement in mild edema pattern/CHF  Low lung volumes persist with atelectasis   Electronically Signed   By: Daryll Brod M.D.   On: 06/27/2013 07:19   Dg Chest Port 1 View  06/26/2013   CLINICAL DATA:  Evaluate endotracheal placement.  EXAM: PORTABLE CHEST - 1 VIEW  COMPARISON:  06/25/2013  FINDINGS: The endotracheal tube is 7 cm above the carina and right at the thoracic inlet. The NG tube is coursing down  the esophagus and into the stomach. The heart is enlarged but stable.  There is persistent vascular congestion and interstitial edema without obvious pleural effusion or pneumothorax.  IMPRESSION: The endotracheal tube is 7 cm above the carina. The NG tube and right PICC line are stable.  Persistent discussed that stable cardiac enlargement and pulmonary edema and areas of atelectasis.   Electronically Signed   By: Kalman Jewels M.D.   On: 06/26/2013 08:28   Dg Chest Port 1 View  06/25/2013   CLINICAL DATA:  CHF.  Hypertension.  Diabetes.  Morbid obesity.  EXAM: PORTABLE CHEST - 1 VIEW  COMPARISON:  DG CHEST 1V PORT dated 06/24/2013; DG CHEST 1V PORT dated 06/23/2013  FINDINGS: Endotracheal tube 4.5 cm above carina. Nasogastric tube poorly visualized distally but likely extends beyond the inferior aspect of the film. Right-sided PICC line terminates at low SVC.  Cardiomegaly accentuated by AP portable technique. Cannot exclude small left pleural effusion. No pneumothorax. Low lung volumes. Slight improvement in moderate interstitial edema. Persistent left base airspace disease.  IMPRESSION: Low lung volumes with improved moderate interstitial edema.  Probable left base atelectasis and adjacent small left pleural effusion.   Electronically Signed   By: Abigail Miyamoto M.D.   On: 06/25/2013 07:29   Dg Chest Port 1 View  06/24/2013   CLINICAL DATA:  Evaluate pulmonary infiltrates. Hypertension. Diabetes. Sleep apnea. Morbid obesity.  EXAM: PORTABLE CHEST - 1 VIEW  COMPARISON:  DG CHEST 1V PORT dated 06/23/2013; DG CHEST 1V PORT dated 06/21/2013  FINDINGS: Endotracheal tube terminates 4.4 cm above carina. Nasogastric tube poorly visualized distally but likely extends beyond the inferior aspect of the film.  Cardiomegaly accentuated by AP portable technique. No right and no definite left pleural effusion. Inferior left hemi thorax poorly evaluated. Low lung volumes. Similar concurrent mild to moderate interstitial edema. Patchy bibasilar airspace disease which is not significantly changed.   IMPRESSION: No significant change since the prior exam.  Interstitial edema, superimposed upon low lung volumes.  Bibasilar Airspace disease, likely atelectasis.   Electronically Signed   By: Abigail Miyamoto M.D.   On: 06/24/2013 07:49   Dg Chest Port 1 View  06/23/2013   CLINICAL DATA:  Evaluate endotracheal tube placement.  EXAM: PORTABLE CHEST - 1 VIEW  COMPARISON:  Chest x-ray 06/21/2013.  FINDINGS: An endotracheal tube is in place with tip 2.2 cm above the carina. A nasogastric tube is seen extending into the stomach, however, the tip of the nasogastric tube extends below the lower margin of the image. Lung volumes are low. Bibasilar opacities (left greater than right) may reflect areas of atelectasis and/or consolidation. Small left pleural effusion. There is cephalization of the pulmonary vasculature, indistinctness of the interstitial markings, and patchy airspace disease throughout the lungs bilaterally suggestive of moderate pulmonary edema. Mild cardiomegaly. The patient is rotated to the left on today's exam, resulting in distortion of the mediastinal contours and reduced diagnostic sensitivity and specificity for mediastinal pathology.  IMPRESSION: 1. Support apparatus, as above. 2. The appearance of the chest suggests worsening congestive heart failure, as above. 3. Low lung volumes with bibasilar atelectasis and/or consolidation. 4. Small left pleural effusion.   Electronically Signed   By: Vinnie Langton M.D.   On: 06/23/2013 08:05   Dg Chest Port 1 View  06/21/2013   CLINICAL DATA:  Status post intubation.  EXAM: PORTABLE CHEST - 1 VIEW  COMPARISON:  06/21/2013  FINDINGS: Endotracheal tube extends into the left mainstem bronchus. There are  patchy lung densities which probably represent edema. Heart size is enlarged. No evidence for a pneumothorax.  IMPRESSION: Endotracheal tube in the left mainstem bronchus.  Patchy interstitial densities suggest edema.  Cardiomegaly.  These results were called  by telephone at the time of interpretation on 06/21/2013 at 8:40 PM to Dr. Shea Evans, Aline Brochure , who verbally acknowledged these results.   Electronically Signed   By: Markus Daft M.D.   On: 06/21/2013 20:41   Dg Chest Port 1 View  06/21/2013   CLINICAL DATA:  Chest pain.  EXAM: PORTABLE CHEST - 1 VIEW  COMPARISON:  04/12/2010  FINDINGS: There are diffuse densities on both sides of the chest. Slightly low lung volumes. Heart size is upper limits of normal and likely accentuated by the technique.  IMPRESSION: Bilateral lung densities. Lung densities may be related to low lung volumes and technique but cannot exclude pulmonary edema.   Electronically Signed   By: Markus Daft M.D.   On: 06/21/2013 18:56   Dg Chest Port 1v Same Day  07/01/2013   CLINICAL DATA:  Hypertension.  Possible pulmonary edema.  EXAM: PORTABLE CHEST - 1 VIEW SAME DAY  COMPARISON:  06/30/2013.  FINDINGS: Cardiomegaly with mild pulmonary vascular prominence interstitial prominence noted. Mild component of congestive heart failure should be considered. PICC line noted in good anatomic position. No focal alveolar infiltrates. No pleural effusion or pneumothorax.  IMPRESSION: 1. Findings suggesting mild congestive heart failure with pulmonary interstitial edema .  2. PICC line in good anatomic position.   Electronically Signed   By: Marcello Moores  Register   On: 07/01/2013 14:38   Dg Abd Portable 1v  06/29/2013   CLINICAL DATA:  panda placement  EXAM: PORTABLE ABDOMEN - 1 VIEW  COMPARISON:  None.  FINDINGS: Feeding tube is identified with tip below the diaphragm in the left upper quadrant.  IMPRESSION: Feeding tube with tip in anticipated position of the stomach.   Electronically Signed   By: Skipper Cliche M.D.   On: 06/29/2013 16:27    Microbiology: Recent Results (from the past 240 hour(s))  URINE CULTURE     Status: None   Collection Time    07/03/13  7:17 PM      Result Value Range Status   Specimen Description URINE, CLEAN CATCH   Final    Special Requests NONE   Final   Culture  Setup Time     Final   Value: 07/04/2013 05:11     Performed at [facility]     Final   Value: >=100,000 COLONIES/ML     Performed at [facility]   Culture     Final   Value: PROTEUS MIRABILIS     Performed at [facility]   Report Status 07/06/2013 FINAL   Final   Organism ID, Bacteria PROTEUS MIRABILIS   Final     Labs: Basic Metabolic Panel:  Recent Labs Lab 07/07/13 0357 07/08/13 0555 07/09/13 0545 07/10/13 0455 07/12/13 0634  NA 149* 150* 144 146 142  K 3.8 3.2* 4.1 3.8 3.5*  CL 113* 114* 109 109 104  CO2 21 23 22 24 23   GLUCOSE 198* 143* 169* 210* 105*  BUN 21 18 17 18 14   CREATININE 2.00* 1.88* 1.92* 1.84* 1.78*  CALCIUM 8.9 8.8 8.4 8.8 8.6   Liver Function Tests: No results found for this basename: AST, ALT, ALKPHOS, BILITOT, PROT, ALBUMIN,  in the last 168 hours No results found for this  basename: LIPASE, AMYLASE,  in the last 168 hours No results found for this basename: AMMONIA,  in the last 168 hours CBC:  Recent Labs Lab 07/09/13 0545 07/10/13 0455 07/12/13 0634  WBC 6.0 5.9 6.3  HGB 11.3* 11.1* 11.4*  HCT 34.1* 34.4* 35.2*  MCV 89.7 89.4 90.3  PLT 321 379 368   Cardiac Enzymes: No results found for this basename: CKTOTAL, CKMB, CKMBINDEX, TROPONINI,  in the last 168 hours BNP: BNP (last 3 results)  Recent Labs  06/29/13 0330  PROBNP 1004.0*   CBG:  Recent Labs Lab 07/12/13 1106 07/12/13 1640 07/12/13 2239 07/13/13 0636 07/13/13 1136  GLUCAP 148* 120* 161* 127* 95       Signed:  Fahima Cifelli  [facility] 07/13/2013, 3:41 PM

## 2013-07-20 ENCOUNTER — Non-Acute Institutional Stay (SKILLED_NURSING_FACILITY): Payer: BC Managed Care – PPO | Admitting: Internal Medicine

## 2013-07-20 DIAGNOSIS — R220 Localized swelling, mass and lump, head: Secondary | ICD-10-CM

## 2013-07-20 DIAGNOSIS — E1310 Other specified diabetes mellitus with ketoacidosis without coma: Secondary | ICD-10-CM

## 2013-07-20 DIAGNOSIS — I10 Essential (primary) hypertension: Secondary | ICD-10-CM

## 2013-07-20 DIAGNOSIS — E1110 Type 2 diabetes mellitus with ketoacidosis without coma: Secondary | ICD-10-CM

## 2013-07-20 DIAGNOSIS — I83009 Varicose veins of unspecified lower extremity with ulcer of unspecified site: Secondary | ICD-10-CM

## 2013-07-20 DIAGNOSIS — N183 Chronic kidney disease, stage 3 unspecified: Secondary | ICD-10-CM

## 2013-07-20 DIAGNOSIS — R569 Unspecified convulsions: Secondary | ICD-10-CM

## 2013-07-20 DIAGNOSIS — R221 Localized swelling, mass and lump, neck: Secondary | ICD-10-CM

## 2013-07-20 DIAGNOSIS — L97909 Non-pressure chronic ulcer of unspecified part of unspecified lower leg with unspecified severity: Secondary | ICD-10-CM

## 2013-07-20 DIAGNOSIS — G4733 Obstructive sleep apnea (adult) (pediatric): Secondary | ICD-10-CM

## 2013-07-20 DIAGNOSIS — G9389 Other specified disorders of brain: Secondary | ICD-10-CM

## 2013-07-20 NOTE — Progress Notes (Signed)
Patient ID: Blake Bell, male   DOB: 03-30-1951, 63 y.o.   MRN: [id number]  Provider:  Rexene Edison. Mariea Clonts, D.O., C.M.D. Location:  [facility]  PCP: [name], MD  Code Status: full code  No Known Allergies  Chief Complaint  Patient presents with  . Hospitalization Follow-up    newly admitted for short term rehab s/p hospitalization for unresponsiveness--had 2 seizures, required intubation, had malignant hypertension, DKA (hba1c 10.5), ?PRES, proteus uti    HPI: 63 y.o. male with h/o diabetes, htn, sleep apnea on cpap, CKD and leg wounds was seen upon admission to snf for rehab after a complicated hospitalization with respiratory failure from seizures requiring intubation in context of DKA, PRES, malignant hypertension and proteus UTI.  He tells me he was not feeling well when at work that day after not eating for several hours, stopped for some fast food on his way home, and ate in his car in the driveway, then apparently when unresponsive there.  He had periods of combativeness and 2 seizures.  Marland Kitchen  His hba1c was found to be 10.5.  He also had a proteus UTI.  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  He was newly started on insulin therapy.    He feels generally better, but quite weak.  He seems eager to learn how to eat better and to lose weight at this point.  He admits to how sick he truly was/is.    ROS: Review of Systems  Constitutional: Positive for malaise/fatigue. Negative for fever and chills.  Eyes: Negative for blurred vision.  Respiratory: Negative for shortness of breath.   Cardiovascular: Positive for leg swelling. Negative for chest pain.  Gastrointestinal: Negative for abdominal pain, constipation, blood in stool and melena.  Genitourinary: Negative for dysuria.  Musculoskeletal: Negative for falls.  Skin: Negative for rash.  Neurological: Positive for seizures, loss of consciousness and weakness. Negative for focal  weakness and headaches.  Endo/Heme/Allergies: Does not bruise/bleed easily.       Uncontrolled diabetes  Psychiatric/Behavioral: Negative for depression and memory loss.    Past Medical History  Diagnosis Date  . Hypertension   . Diabetes mellitus without complication   . Sleep apnea     uses cpap, setting of 5  . Chronic kidney disease     ssees dr Florene Glen nephrology every 6-9 months  . Multiple wounds     on lower legs - sees [name] Pain at the [place]   Past Surgical History  Procedure Laterality Date  . Colonscopy  6-7 yrs ago  . Colonoscopy N/A 04/05/2013    Procedure: COLONOSCOPY;  Surgeon: Juanita Craver, MD;  Location: [facility];  Service: Endoscopy;  Laterality: N/A;   Social History:   reports that he quit smoking about 43 years ago. His smoking use included Cigarettes. He has a .5 pack-year smoking history. He has never used smokeless tobacco. He reports that he does not drink alcohol or use illicit drugs.  Family History  Problem Relation Age of Onset  . Diabetes Mother   . Diabetes Father   . Diabetes Sister   . Diabetes Brother   . Hypertension Mother   . Hypertension Father     Medications: Patient's Medications  New Prescriptions   No medications on file  Previous Medications   ALBIGLUTIDE (TANZEUM) 30 MG PEN    Inject 30 mg into the skin once a week.   CABERGOLINE (DOSTINEX) 0.5 MG TABLET    Take 0.5 tablets (  0.25 mg total) by mouth 2 (two) times a week.   FUROSEMIDE (LASIX) 80 MG TABLET    Take 240 mg by mouth daily.   HYDRALAZINE (APRESOLINE) 100 MG TABLET    Take 1 tablet (100 mg total) by mouth every 8 (eight) hours.   INSULIN ASPART (NOVOLOG) 100 UNIT/ML INJECTION    Inject 6 Units into the skin 3 (three) times daily with meals.   INSULIN GLARGINE (LANTUS) 100 UNIT/ML INJECTION    Inject 0.3 mLs (30 Units total) into the skin daily.  Modified Medications   Modified Medication Previous Medication   LABETALOL (NORMODYNE) 200 MG TABLET labetalol  (NORMODYNE) 200 MG tablet      Take 200 mg by mouth 3 (three) times daily.    Take 1 tablet (200 mg total) by mouth 2 (two) times daily.  Discontinued Medications   ACETAMINOPHEN (TYLENOL) 325 MG TABLET    Take 2 tablets (650 mg total) by mouth every 6 (six) hours as needed for mild pain or headache.   AMLODIPINE (NORVASC) 10 MG TABLET    Take 10 mg by mouth daily.   ANTISEPTIC ORAL RINSE (BIOTENE) LIQD    15 mLs by Mouth Rinse route 2 times daily at 12 noon and 4 pm.   CHLORHEXIDINE (PERIDEX) 0.12 % SOLUTION    15 mLs by Mouth Rinse route 2 (two) times daily.   CLONIDINE (CATAPRES) 0.3 MG TABLET    Take 1 tablet (0.3 mg total) by mouth 3 (three) times daily.   FEEDING SUPPLEMENT, ENSURE, (ENSURE) PUDG    Take 1 Container by mouth 3 (three) times daily between meals.   FUROSEMIDE (LASIX) 40 MG TABLET    Take 1 tablet (40 mg total) by mouth daily.   INSULIN ASPART (NOVOLOG) 100 UNIT/ML INJECTION    Inject 0-20 Units into the skin 3 (three) times daily with meals.   INSULIN ASPART (NOVOLOG) 100 UNIT/ML INJECTION    Inject 6 Units into the skin 3 (three) times daily with meals.   LEVOFLOXACIN (LEVAQUIN) 500 MG TABLET    Take 1 tablet (500 mg total) by mouth daily.   MALTODEXTRIN-XANTHAN GUM (RESOURCE THICKENUP CLEAR) POWD    PRN   PANTOPRAZOLE (PROTONIX) 40 MG TABLET    Take 1 tablet (40 mg total) by mouth daily.     Physical Exam: Filed Vitals:   07/20/13 1337  BP: 158/92  Pulse: 88  Temp: 97.7 F (36.5 C)  Resp: 18  Height: 5\' 10"  (1.778 m)  Weight: 399 lb (180.985 kg)   Physical Exam  Constitutional: He is oriented to person, place, and time. No distress.  Morbidly obese black male  HENT:  Head: Normocephalic and atraumatic.  Right Ear: External ear normal.  Left Ear: External ear normal.  Nose: Nose normal.  Mouth/Throat: Oropharynx is clear and moist.  Eyes: Conjunctivae and EOM are normal. Pupils are equal, round, and reactive to light.  Neck: Normal range of motion. Neck  supple. No JVD present.  Cardiovascular: Normal rate, regular rhythm, normal heart sounds and intact distal pulses.   Pulmonary/Chest: Effort normal and breath sounds normal. He has no rales.  Abdominal: Soft. Bowel sounds are normal. He exhibits no distension and no mass. There is no tenderness. No hernia.  Musculoskeletal: Normal range of motion. He exhibits edema. He exhibits no tenderness.  Lymphadenopathy:    He has no cervical adenopathy.  Neurological: He is alert and oriented to person, place, and time.  Skin: Skin is warm and dry.  Psychiatric: He has a normal mood and affect.    Labs reviewed: Basic Metabolic Panel:  Recent Labs  06/23/13 0240  06/29/13 0330 06/30/13 0500  01/15/14 0500 01/15/14 1700 01/16/14 0325  NA 150*  < > 161* 158*  < > 139 140 138  K 3.2*  < > 3.4* 2.9*  < > 3.1* 3.2* 4.3  CL 112  < > 121* 121*  < > 101 97 99  CO2 24  < > 30 24  < > 28 27 24   GLUCOSE 181*  < > 169* 169*  < > 180* 91 160*  BUN 30*  < > 29* 27*  < > 21 25* 26*  CREATININE 2.04*  < > 1.52* 1.56*  < > 2.15* 2.32* 2.14*  CALCIUM 8.2*  < > 9.4 8.3*  < > 8.8 9.0 8.6  MG 1.6  < > 1.9 1.8  --  1.5  --  1.9  PHOS 3.6  --  3.1 3.5  --   --   --   --   < > = values in this interval not displayed. Liver Function Tests:  Recent Labs  06/21/13 1750 06/23/13 0240 01/13/14 1757  AST 56* 12 15  ALT 42 19 10  ALKPHOS 161* 100 88  BILITOT <0.2* <0.2* 0.3  PROT 7.9 5.9* 7.0  ALBUMIN 2.8* 1.9* 2.5*   No results found for this basename: LIPASE, AMYLASE,  in the last 8760 hours No results found for this basename: AMMONIA,  in the last 8760 hours CBC:  Recent Labs  06/21/13 1750  06/29/13 0330  07/10/13 0455 07/12/13 0634 01/13/14 1757 01/13/14 1856  WBC 12.3*  < > 12.2*  < > 5.9 6.3 12.0*  --   NEUTROABS 7.6  --  8.3*  --   --   --   --  9.7*  HGB 14.2  < > 13.6  < > 11.1* 11.4* 11.5*  --   HCT 44.1  < > 43.2  < > 34.4* 35.2* 35.2*  --   MCV 90.4  < > 96.0  < > 89.4 90.3 89.3   --   PLT 440*  < > 365  < > 379 368 429*  --   < > = values in this interval not displayed. Cardiac Enzymes:  Recent Labs  06/21/13 1750 06/22/13 0232  TROPONINI <0.30 <0.30   BNP: No components found with this basename: POCBNP,  CBG:  Recent Labs  01/16/14 2127 01/17/14 0643 01/17/14 1151  GLUCAP 144* 129* 153*    Assessment/Plan 1. DM (diabetes mellitus) type 2, uncontrolled, with ketoacidosis diabetic teaching needed He seems motivated to lose weight and eat a proper diet at this point Cont insulin regimen and adjust as needed over the next few weeks while he is completing rehab  2. OSA (obstructive sleep apnea) -cont cpap therapy  3. Venous stasis ulcers, unspecified laterality -wound care physician to follow for this -follows with [facility] [name] Pain as outpatient  4. CKD (chronic kidney disease) stage 3, GFR 30-59 ml/min -bp control to help prevent worsening, avoid nephrotoxic meds  5. Morbid obesity -encouraged dietary changes, exercise, weight loss and will get him some diabetic teaching at home when he leaves here, too  6. Seizure -related to DKA, not put on meds for this at this point, could also be related to his pituitary mass, monitor  7. 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 Malignant  hypertension -bp goal <130/80 -today's was elevated--will cont to monitor and adjust meds if staying over goal   Functional status: requiring adl assist due to weakness after hospitalization and overall deconditioning  Family/ staff Communication: seen with unit supervisor  Labs/tests ordered:  Cbc, bmp in 1 wk, MRI as above, diabetic teaching

## 2013-07-22 ENCOUNTER — Other Ambulatory Visit: Payer: Self-pay | Admitting: Internal Medicine

## 2013-07-22 DIAGNOSIS — E237 Disorder of pituitary gland, unspecified: Secondary | ICD-10-CM

## 2013-07-22 DIAGNOSIS — D352 Benign neoplasm of pituitary gland: Secondary | ICD-10-CM

## 2013-07-23 ENCOUNTER — Non-Acute Institutional Stay (SKILLED_NURSING_FACILITY): Payer: BC Managed Care – PPO | Admitting: Internal Medicine

## 2013-07-23 DIAGNOSIS — E119 Type 2 diabetes mellitus without complications: Secondary | ICD-10-CM

## 2013-07-23 DIAGNOSIS — K219 Gastro-esophageal reflux disease without esophagitis: Secondary | ICD-10-CM

## 2013-07-23 DIAGNOSIS — R531 Weakness: Secondary | ICD-10-CM

## 2013-07-23 DIAGNOSIS — E876 Hypokalemia: Secondary | ICD-10-CM

## 2013-07-23 DIAGNOSIS — D352 Benign neoplasm of pituitary gland: Secondary | ICD-10-CM

## 2013-07-23 DIAGNOSIS — R5383 Other fatigue: Secondary | ICD-10-CM

## 2013-07-23 DIAGNOSIS — D353 Benign neoplasm of craniopharyngeal duct: Secondary | ICD-10-CM

## 2013-07-23 DIAGNOSIS — R5381 Other malaise: Secondary | ICD-10-CM

## 2013-07-23 MED ORDER — FREESTYLE LANCETS MISC: Status: DC

## 2013-07-23 MED ORDER — GLUCOSE BLOOD VI STRP: ORAL_STRIP | Status: DC

## 2013-07-23 MED ORDER — FREESTYLE SYSTEM KIT: 1.0000 | PACK | Status: DC | PRN

## 2013-07-23 NOTE — Progress Notes (Signed)
Patient ID: Blake Bell, male   DOB: 01-26-1951, 63 y.o.   MRN: [id number]   Correction in exam findings  Weakness in lower extremities, obese with body habitus limiting ROM

## 2013-07-23 NOTE — Progress Notes (Signed)
Patient ID: Blake Bell, male   DOB: 01/10/1951, 63 y.o.   MRN: [id number]    [name] living [facility]  Chief Complaint  Patient presents with  . Discharge Note    discharge visit   No Known Allergies  HPI 63 y/o male pt is here for STR after hospital admission from 06/21/13- 07/13/13 with unresponsiveness in setting of seizure. He had a prolonged hospital stay requiring intubation, uncontrolled hypertension, DKA, electrolyte imbalance and UTI. He was undergoing rehabilitation here. His insurance is not providing further coverage for his stay in the facility and he will thus need to be discharged home with home health services. He was seen in his room today. No concern from staff  Review of Systems  Constitutional: Negative for fever, chills, weight loss, malaise/fatigue and diaphoresis.  HENT: Negative for congestion, hearing loss and sore throat.   Eyes: Negative for blurred vision, double vision and discharge.  Respiratory: Negative for cough, sputum production, shortness of breath and wheezing.   Cardiovascular: Negative for chest pain, palpitations, orthopnea and leg swelling.  Gastrointestinal: Negative for heartburn, nausea, vomiting, abdominal pain, diarrhea and constipation.  Genitourinary: Negative for dysuria, urgency, frequency and flank pain.  Musculoskeletal: Negative for back pain, falls, joint pain and myalgias.  Skin: Negative for itching and rash.  Neurological: Positive for weakness. Negative for dizziness, tingling, focal weakness and headaches.  Psychiatric/Behavioral: Negative for depression and memory loss. The patient is not nervous/anxious.    Past Medical History  Diagnosis Date  . Hypertension   . Diabetes mellitus without complication   . Sleep apnea     uses cpap, setting of 5  . Chronic kidney disease     ssees dr Florene Glen nephrology every 6-9 months  . Multiple wounds     on lower legs - sees [name] Pain at the [facility]   Past Surgical History   Procedure Laterality Date  . Colonscopy  6-7 yrs ago  . Colonoscopy N/A 04/05/2013    Procedure: COLONOSCOPY;  Surgeon: Juanita Craver, MD;  Location: [facility];  Service: Endoscopy;  Laterality: N/A;   Medication reviewed. See [facility]  Physical exam BP 137/71  Pulse 76  Temp(Src) 97.9 F (36.6 C)  Resp 18  Ht 5\' 10"  (1.778 m)  Wt 399 lb (180.985 kg)  BMI 57.25 kg/m2  SpO2 95%  General- elderly male in no acute distress Head- atraumatic, normocephalic Eyes- PERRLA, EOMI, no pallor, no icterus Neck- no lymphadenopathy, no thyromegaly, no jugular vein distension, no carotid bruit Chest- no chest wall deformities, no chest wall tenderness Cardiovascular- normal s1,s2, no murmurs/ rubs/ gallops Respiratory- bilateral clear to auscultation, no wheeze, no rhonchi, no crackles Abdomen- bowel sounds present, soft, non tender, no organomegaly, no abdominal bruits, no guarding or rigidity, no CVA tenderness Musculoskeletal- able to move all 4 extremities, no spinal and paraspinal tenderness, steady gait, no use of assistive device Neurological- no focal deficit, normal reflexes, normal muscle strength, normal sensation to fine touch and vibration Psychiatry- alert and oriented to person, place and time, normal mood and affect  Labs-  CMP     Component Value Date/Time   NA 142 07/12/2013 0634   K 3.5* 07/12/2013 0634   CL 104 07/12/2013 0634   CO2 23 07/12/2013 0634   GLUCOSE 105* 07/12/2013 0634   BUN 14 07/12/2013 0634   CREATININE 1.78* 07/12/2013 0634   CALCIUM 8.6 07/12/2013 0634   PROT 5.9* 06/23/2013 0240   ALBUMIN 1.9* 06/23/2013 0240   AST 12 06/23/2013 0240  ALT 19 06/23/2013 0240   ALKPHOS 100 06/23/2013 0240   BILITOT <0.2* 06/23/2013 0240   GFRNONAA 39* 07/12/2013 0634   GFRAA 45* 07/12/2013 0634    07/21/13 wbc 6.9, hb 11.6, hct 36, plt 352, granulocyte 555, na 140, k 3.2, glu 134, bun 17, cr 1.49, ca 9  Assessment/plan  63 y/o male pt will be discharged home with home health  services for PT and OT and RN for diabetic teaching and blood work follow up and wound care. Scripts for medication to be provided on discharge. Will need to make appointment with Dr Karlton Lemon PCP prior to discharge day- social worker notifed and working on it.  Generalized weakness- improved since admission but still requires home health services- PT/OT/RN. Fall precautions  Prolactinoma- on cabergoline. Continue 0.25 mg three days a week. Has MRI shcedule to follow on his pitutary gland on 08/02/13 12: 45 pm at [facility]  Hypertension- continue amlodipine 10 mg daily, clonidine 0.3 mg tid, lasix 40 mg daily, labetalol 200 mg bid, hydralazine 100 mg tid  GERD- continue pantoprazole 40 mg daily  DM type 2- continue lantus 30 u daily, monitor cbg, RN will assist with this, also to continue premeal novolog 6 u. Script for diabetic supplies sent to his pharmacy  Hypokalemia- will have him on kcl 20 meq bid for now and recheck BMP in 1 week to evaluate with pcp

## 2013-07-23 NOTE — Addendum Note (Signed)
Addended byBlanchie Serve on: 07/23/2013 12:33 PM   Modules accepted: Orders

## 2013-07-28 ENCOUNTER — Encounter (HOSPITAL_BASED_OUTPATIENT_CLINIC_OR_DEPARTMENT_OTHER): Payer: BC Managed Care – PPO | Attending: General Surgery

## 2013-07-28 DIAGNOSIS — I890 Lymphedema, not elsewhere classified: Secondary | ICD-10-CM | POA: Insufficient documentation

## 2013-07-28 DIAGNOSIS — E669 Obesity, unspecified: Secondary | ICD-10-CM | POA: Insufficient documentation

## 2013-07-28 DIAGNOSIS — E119 Type 2 diabetes mellitus without complications: Secondary | ICD-10-CM | POA: Insufficient documentation

## 2013-07-28 DIAGNOSIS — I10 Essential (primary) hypertension: Secondary | ICD-10-CM | POA: Insufficient documentation

## 2013-07-28 DIAGNOSIS — L97809 Non-pressure chronic ulcer of other part of unspecified lower leg with unspecified severity: Secondary | ICD-10-CM | POA: Insufficient documentation

## 2013-07-29 LAB — GLUCOSE, CAPILLARY: Glucose-Capillary: 244 mg/dL — ABNORMAL HIGH (ref 70–99)

## 2013-07-30 ENCOUNTER — Other Ambulatory Visit: Payer: Self-pay | Admitting: Internal Medicine

## 2013-07-30 DIAGNOSIS — Z139 Encounter for screening, unspecified: Secondary | ICD-10-CM

## 2013-08-02 ENCOUNTER — Ambulatory Visit
Admission: RE | Admit: 2013-08-02 | Discharge: 2013-08-02 | Disposition: A | Discharge status: Home / Self Care | Payer: BC Managed Care – PPO | Source: Ambulatory Visit | Attending: Internal Medicine | Admitting: Internal Medicine

## 2013-08-02 DIAGNOSIS — Z139 Encounter for screening, unspecified: Secondary | ICD-10-CM

## 2013-08-02 DIAGNOSIS — D352 Benign neoplasm of pituitary gland: Secondary | ICD-10-CM

## 2013-08-18 ENCOUNTER — Encounter (HOSPITAL_BASED_OUTPATIENT_CLINIC_OR_DEPARTMENT_OTHER): Payer: BC Managed Care – PPO | Attending: General Surgery

## 2013-08-18 DIAGNOSIS — L97809 Non-pressure chronic ulcer of other part of unspecified lower leg with unspecified severity: Secondary | ICD-10-CM | POA: Insufficient documentation

## 2013-08-18 DIAGNOSIS — I10 Essential (primary) hypertension: Secondary | ICD-10-CM | POA: Insufficient documentation

## 2013-09-11 ENCOUNTER — Other Ambulatory Visit: Payer: Self-pay | Admitting: Internal Medicine

## 2013-09-15 ENCOUNTER — Encounter (HOSPITAL_BASED_OUTPATIENT_CLINIC_OR_DEPARTMENT_OTHER): Payer: BC Managed Care – PPO | Attending: General Surgery

## 2013-09-15 DIAGNOSIS — L97809 Non-pressure chronic ulcer of other part of unspecified lower leg with unspecified severity: Secondary | ICD-10-CM | POA: Insufficient documentation

## 2013-10-20 ENCOUNTER — Encounter (HOSPITAL_BASED_OUTPATIENT_CLINIC_OR_DEPARTMENT_OTHER): Payer: BC Managed Care – PPO | Attending: General Surgery

## 2013-10-20 DIAGNOSIS — L97909 Non-pressure chronic ulcer of unspecified part of unspecified lower leg with unspecified severity: Secondary | ICD-10-CM | POA: Insufficient documentation

## 2013-11-17 ENCOUNTER — Encounter (HOSPITAL_BASED_OUTPATIENT_CLINIC_OR_DEPARTMENT_OTHER): Payer: BC Managed Care – PPO | Attending: General Surgery

## 2013-11-17 DIAGNOSIS — L97809 Non-pressure chronic ulcer of other part of unspecified lower leg with unspecified severity: Secondary | ICD-10-CM | POA: Insufficient documentation

## 2013-11-24 LAB — GLUCOSE, CAPILLARY: GLUCOSE-CAPILLARY: 249 mg/dL — AB (ref 70–99)

## 2013-11-29 ENCOUNTER — Encounter: Payer: Self-pay | Admitting: Pulmonary Disease

## 2013-11-29 ENCOUNTER — Ambulatory Visit (INDEPENDENT_AMBULATORY_CARE_PROVIDER_SITE_OTHER): Payer: BC Managed Care – PPO | Admitting: Pulmonary Disease

## 2013-11-29 VITALS — BP 170/100 | HR 72 | Temp 98.7°F | Ht 69.5 in | Wt 368.0 lb

## 2013-11-29 DIAGNOSIS — G4733 Obstructive sleep apnea (adult) (pediatric): Secondary | ICD-10-CM

## 2013-11-29 NOTE — Progress Notes (Signed)
   Subjective:    Patient ID: Blake Bell, male    DOB: Dec 03, 1950, 63 y.o.   MRN: [id number]  HPI Patient comes in today for followup of his obstructive sleep apnea. He initially wore CPAP compliantly, but began to have issues with the CPAP pressure blowing his mouth open. Instead of calling us for advice, he just stopped using his CPAP machine. He was recently in the hospital and they provided him with a nasal mask and a chin strap which she has yet to start using. I've asked him to start using this immediately. He does not have any issue with the auto setting on the CPAP device.   Review of Systems  Constitutional: Negative for fever and unexpected weight change.  HENT: Negative for congestion, dental problem, ear pain, nosebleeds, postnasal drip, rhinorrhea, sinus pressure, sneezing, sore throat and trouble swallowing.   Eyes: Negative for redness and itching.  Respiratory: Negative for cough, chest tightness, shortness of breath and wheezing.   Cardiovascular: Negative for palpitations and leg swelling.  Gastrointestinal: Negative for nausea and vomiting.  Genitourinary: Negative for dysuria.  Musculoskeletal: Negative for joint swelling.  Skin: Negative for rash.  Neurological: Negative for headaches.  Hematological: Does not bruise/bleed easily.  Psychiatric/Behavioral: Negative for dysphoric mood. The patient is not nervous/anxious.        Objective:   Physical Exam Morbidly obese male in no acute distress Neck without lymphadenopathy or thyromegaly No skin breakdown or pressure necrosis from the CPAP mask Nose without purulence or discharge noted Lower extremity edema present, but no cyanosis Alert and oriented, moves all 4 extremities.       Assessment & Plan:

## 2013-11-29 NOTE — Assessment & Plan Note (Signed)
The patient has very severe obstructive sleep apnea, and unfortunately has not been wearing his CPAP consistently. He has lost about 27 pounds, but his blood pressure is very elevated. He now has a chin strap to add to his nasal mask, but I suspect he will end up on a full face mask. He knows to call us if his current set up is not adequate. He has said that he will get back on his CPAP machine, and I've encouraged him to continue working on weight loss.

## 2013-11-29 NOTE — Patient Instructions (Signed)
Continue with cpap using chin strap, but call if you are still having issues wearing the cpap mask. Work on [employer] loss Please call your family doctor and let her know your blood pressure is high.  Call today. followup with me again in one year if doing well.

## 2013-12-02 ENCOUNTER — Other Ambulatory Visit: Payer: Self-pay | Admitting: Internal Medicine

## 2013-12-15 ENCOUNTER — Encounter (HOSPITAL_BASED_OUTPATIENT_CLINIC_OR_DEPARTMENT_OTHER): Payer: BC Managed Care – PPO | Attending: General Surgery

## 2013-12-15 DIAGNOSIS — I639 Cerebral infarction, unspecified: Secondary | ICD-10-CM

## 2013-12-15 DIAGNOSIS — L97809 Non-pressure chronic ulcer of other part of unspecified lower leg with unspecified severity: Secondary | ICD-10-CM | POA: Insufficient documentation

## 2013-12-15 HISTORY — DX: Cerebral infarction, unspecified: I63.9

## 2014-01-13 ENCOUNTER — Inpatient Hospital Stay (HOSPITAL_COMMUNITY): Payer: BC Managed Care – PPO

## 2014-01-13 ENCOUNTER — Encounter (HOSPITAL_COMMUNITY): Payer: Self-pay | Admitting: Emergency Medicine

## 2014-01-13 ENCOUNTER — Inpatient Hospital Stay (HOSPITAL_COMMUNITY)
Admission: EM | Admit: 2014-01-13 | Discharge: 2014-01-18 | DRG: 064 | Disposition: A | Discharge status: Home Health | Payer: BC Managed Care – PPO | Attending: Internal Medicine | Admitting: Internal Medicine

## 2014-01-13 ENCOUNTER — Emergency Department (HOSPITAL_COMMUNITY): Payer: BC Managed Care – PPO

## 2014-01-13 DIAGNOSIS — R569 Unspecified convulsions: Secondary | ICD-10-CM

## 2014-01-13 DIAGNOSIS — IMO0002 Reserved for concepts with insufficient information to code with codable children: Secondary | ICD-10-CM | POA: Diagnosis present

## 2014-01-13 DIAGNOSIS — Z833 Family history of diabetes mellitus: Secondary | ICD-10-CM

## 2014-01-13 DIAGNOSIS — I129 Hypertensive chronic kidney disease with stage 1 through stage 4 chronic kidney disease, or unspecified chronic kidney disease: Secondary | ICD-10-CM | POA: Diagnosis present

## 2014-01-13 DIAGNOSIS — I872 Venous insufficiency (chronic) (peripheral): Secondary | ICD-10-CM | POA: Diagnosis present

## 2014-01-13 DIAGNOSIS — Z794 Long term (current) use of insulin: Secondary | ICD-10-CM

## 2014-01-13 DIAGNOSIS — E1310 Other specified diabetes mellitus with ketoacidosis without coma: Secondary | ICD-10-CM | POA: Diagnosis present

## 2014-01-13 DIAGNOSIS — E1165 Type 2 diabetes mellitus with hyperglycemia: Secondary | ICD-10-CM | POA: Diagnosis present

## 2014-01-13 DIAGNOSIS — E1110 Type 2 diabetes mellitus with ketoacidosis without coma: Secondary | ICD-10-CM | POA: Diagnosis present

## 2014-01-13 DIAGNOSIS — L98499 Non-pressure chronic ulcer of skin of other sites with unspecified severity: Secondary | ICD-10-CM | POA: Diagnosis present

## 2014-01-13 DIAGNOSIS — E870 Hyperosmolality and hypernatremia: Secondary | ICD-10-CM

## 2014-01-13 DIAGNOSIS — N183 Chronic kidney disease, stage 3 unspecified: Secondary | ICD-10-CM

## 2014-01-13 DIAGNOSIS — I6350 Cerebral infarction due to unspecified occlusion or stenosis of unspecified cerebral artery: Principal | ICD-10-CM

## 2014-01-13 DIAGNOSIS — E118 Type 2 diabetes mellitus with unspecified complications: Secondary | ICD-10-CM

## 2014-01-13 DIAGNOSIS — Z87891 Personal history of nicotine dependence: Secondary | ICD-10-CM

## 2014-01-13 DIAGNOSIS — R0689 Other abnormalities of breathing: Secondary | ICD-10-CM

## 2014-01-13 DIAGNOSIS — E1169 Type 2 diabetes mellitus with other specified complication: Secondary | ICD-10-CM

## 2014-01-13 DIAGNOSIS — R220 Localized swelling, mass and lump, head: Secondary | ICD-10-CM

## 2014-01-13 DIAGNOSIS — N179 Acute kidney failure, unspecified: Secondary | ICD-10-CM | POA: Diagnosis present

## 2014-01-13 DIAGNOSIS — Z8249 Family history of ischemic heart disease and other diseases of the circulatory system: Secondary | ICD-10-CM

## 2014-01-13 DIAGNOSIS — I83009 Varicose veins of unspecified lower extremity with ulcer of unspecified site: Secondary | ICD-10-CM

## 2014-01-13 DIAGNOSIS — Z23 Encounter for immunization: Secondary | ICD-10-CM

## 2014-01-13 DIAGNOSIS — E876 Hypokalemia: Secondary | ICD-10-CM | POA: Diagnosis not present

## 2014-01-13 DIAGNOSIS — I10 Essential (primary) hypertension: Secondary | ICD-10-CM

## 2014-01-13 DIAGNOSIS — G9389 Other specified disorders of brain: Secondary | ICD-10-CM

## 2014-01-13 DIAGNOSIS — R4789 Other speech disturbances: Secondary | ICD-10-CM | POA: Diagnosis present

## 2014-01-13 DIAGNOSIS — E1122 Type 2 diabetes mellitus with diabetic chronic kidney disease: Secondary | ICD-10-CM

## 2014-01-13 DIAGNOSIS — Z6841 Body Mass Index (BMI) 40.0 and over, adult: Secondary | ICD-10-CM

## 2014-01-13 DIAGNOSIS — I639 Cerebral infarction, unspecified: Secondary | ICD-10-CM | POA: Diagnosis present

## 2014-01-13 DIAGNOSIS — N184 Chronic kidney disease, stage 4 (severe): Secondary | ICD-10-CM

## 2014-01-13 DIAGNOSIS — I6340 Cerebral infarction due to embolism of unspecified cerebral artery: Secondary | ICD-10-CM

## 2014-01-13 DIAGNOSIS — F801 Expressive language disorder: Secondary | ICD-10-CM | POA: Diagnosis present

## 2014-01-13 DIAGNOSIS — E785 Hyperlipidemia, unspecified: Secondary | ICD-10-CM | POA: Diagnosis present

## 2014-01-13 DIAGNOSIS — G4733 Obstructive sleep apnea (adult) (pediatric): Secondary | ICD-10-CM | POA: Diagnosis present

## 2014-01-13 DIAGNOSIS — L97909 Non-pressure chronic ulcer of unspecified part of unspecified lower leg with unspecified severity: Secondary | ICD-10-CM | POA: Diagnosis present

## 2014-01-13 LAB — I-STAT TROPONIN, ED: Troponin i, poc: 0.06 ng/mL (ref 0.00–0.08)

## 2014-01-13 LAB — DIFFERENTIAL
BASOS ABS: 0.1 10*3/uL (ref 0.0–0.1)
Basophils Relative: 1 % (ref 0–1)
Eosinophils Absolute: 0.1 10*3/uL (ref 0.0–0.7)
Eosinophils Relative: 1 % (ref 0–5)
Lymphocytes Relative: 10 % — ABNORMAL LOW (ref 12–46)
Lymphs Abs: 1.3 10*3/uL (ref 0.7–4.0)
Monocytes Absolute: 1 10*3/uL (ref 0.1–1.0)
Monocytes Relative: 8 % (ref 3–12)
NEUTROS ABS: 9.7 10*3/uL — AB (ref 1.7–7.7)
Neutrophils Relative %: 80 % — ABNORMAL HIGH (ref 43–77)

## 2014-01-13 LAB — COMPREHENSIVE METABOLIC PANEL
ALBUMIN: 2.5 g/dL — AB (ref 3.5–5.2)
ALK PHOS: 88 U/L (ref 39–117)
ALT: 10 U/L (ref 0–53)
AST: 15 U/L (ref 0–37)
Anion gap: 13 (ref 5–15)
BUN: 16 mg/dL (ref 6–23)
CO2: 26 mEq/L (ref 19–32)
Calcium: 9 mg/dL (ref 8.4–10.5)
Chloride: 100 mEq/L (ref 96–112)
Creatinine, Ser: 2 mg/dL — ABNORMAL HIGH (ref 0.50–1.35)
GFR calc Af Amer: 39 mL/min — ABNORMAL LOW (ref >90)
GFR calc non Af Amer: 34 mL/min — ABNORMAL LOW (ref >90)
Glucose, Bld: 264 mg/dL — ABNORMAL HIGH (ref 70–99)
POTASSIUM: 2.9 meq/L — AB (ref 3.7–5.3)
Sodium: 139 mEq/L (ref 137–147)
Total Bilirubin: 0.3 mg/dL (ref 0.3–1.2)
Total Protein: 7 g/dL (ref 6.0–8.3)

## 2014-01-13 LAB — CBC
HCT: 35.2 % — ABNORMAL LOW (ref 39.0–52.0)
Hemoglobin: 11.5 g/dL — ABNORMAL LOW (ref 13.0–17.0)
MCH: 29.2 pg (ref 26.0–34.0)
MCHC: 32.7 g/dL (ref 30.0–36.0)
MCV: 89.3 fL (ref 78.0–100.0)
PLATELETS: 429 10*3/uL — AB (ref 150–400)
RBC: 3.94 MIL/uL — ABNORMAL LOW (ref 4.22–5.81)
RDW: 15.8 % — AB (ref 11.5–15.5)
WBC: 12 10*3/uL — AB (ref 4.0–10.5)

## 2014-01-13 LAB — RAPID URINE DRUG SCREEN, HOSP PERFORMED
Amphetamines: NOT DETECTED
BENZODIAZEPINES: NOT DETECTED
Barbiturates: NOT DETECTED
COCAINE: NOT DETECTED
Opiates: NOT DETECTED
Tetrahydrocannabinol: NOT DETECTED

## 2014-01-13 LAB — PROTIME-INR
INR: 1.01 (ref 0.00–1.49)
Prothrombin Time: 13.3 seconds (ref 11.6–15.2)

## 2014-01-13 LAB — URINALYSIS, ROUTINE W REFLEX MICROSCOPIC
BILIRUBIN URINE: NEGATIVE
GLUCOSE, UA: 250 mg/dL — AB
KETONES UR: NEGATIVE mg/dL
LEUKOCYTES UA: NEGATIVE
Nitrite: NEGATIVE
PH: 7 (ref 5.0–8.0)
Protein, ur: >300 mg/dL — AB
SPECIFIC GRAVITY, URINE: 1.027 (ref 1.005–1.030)
Urobilinogen, UA: 0.2 mg/dL (ref 0.0–1.0)

## 2014-01-13 LAB — ETHANOL: Alcohol, Ethyl (B): <11 mg/dL (ref 0–11)

## 2014-01-13 LAB — URINE MICROSCOPIC-ADD ON

## 2014-01-13 LAB — APTT: aPTT: 31 seconds (ref 24–37)

## 2014-01-13 LAB — CBG MONITORING, ED
GLUCOSE-CAPILLARY: 260 mg/dL — AB (ref 70–99)
GLUCOSE-CAPILLARY: 192 mg/dL — AB (ref 70–99)

## 2014-01-13 MED ORDER — POTASSIUM CHLORIDE 10 MEQ/100ML IV SOLN: 10.0000 meq | INTRAVENOUS | Status: AC

## 2014-01-13 MED ORDER — INSULIN ASPART 100 UNIT/ML ~~LOC~~ SOLN
0.0000 [IU] | Freq: Three times a day (TID) | SUBCUTANEOUS | Status: DC
  Administered 2014-01-14 (×2): 3 [IU] via SUBCUTANEOUS
  Administered 2014-01-15: 8 [IU] via SUBCUTANEOUS
  Administered 2014-01-15: 5 [IU] via SUBCUTANEOUS

## 2014-01-13 MED ORDER — FUROSEMIDE 80 MG PO TABS
240.0000 mg | ORAL_TABLET | Freq: Every day | ORAL | Status: DC
  Administered 2014-01-13 – 2014-01-18 (×6): 240 mg via ORAL
  Filled 2014-01-13: qty 6
  Filled 2014-01-13 (×5): qty 3

## 2014-01-13 MED ORDER — INSULIN GLARGINE 100 UNIT/ML ~~LOC~~ SOLN
30.0000 [IU] | Freq: Every day | SUBCUTANEOUS | Status: DC
  Administered 2014-01-14 – 2014-01-15 (×2): 30 [IU] via SUBCUTANEOUS
  Filled 2014-01-13 (×2): qty 0.3

## 2014-01-13 MED ORDER — HYDRALAZINE HCL 50 MG PO TABS
100.0000 mg | ORAL_TABLET | Freq: Three times a day (TID) | ORAL | Status: DC
  Administered 2014-01-13 – 2014-01-18 (×15): 100 mg via ORAL
  Filled 2014-01-13 (×16): qty 2

## 2014-01-13 MED ORDER — SENNOSIDES-DOCUSATE SODIUM 8.6-50 MG PO TABS: 1.0000 | ORAL_TABLET | Freq: Every evening | ORAL | Status: DC | PRN

## 2014-01-13 MED ORDER — POTASSIUM CHLORIDE 10 MEQ/100ML IV SOLN
10.0000 meq | Freq: Once | INTRAVENOUS | Status: AC
  Administered 2014-01-13: 10 meq via INTRAVENOUS
  Filled 2014-01-13: qty 100

## 2014-01-13 MED ORDER — HEPARIN SODIUM (PORCINE) 5000 UNIT/ML IJ SOLN
5000.0000 [IU] | Freq: Three times a day (TID) | INTRAMUSCULAR | Status: DC
  Administered 2014-01-13 – 2014-01-18 (×15): 5000 [IU] via SUBCUTANEOUS
  Filled 2014-01-13 (×16): qty 1

## 2014-01-13 MED ORDER — LABETALOL HCL 200 MG PO TABS
200.0000 mg | ORAL_TABLET | Freq: Three times a day (TID) | ORAL | Status: DC
  Administered 2014-01-13 – 2014-01-18 (×14): 200 mg via ORAL
  Filled 2014-01-13 (×14): qty 1

## 2014-01-13 MED ORDER — ASPIRIN 300 MG RE SUPP: 300.0000 mg | Freq: Every day | RECTAL | Status: DC

## 2014-01-13 MED ORDER — POTASSIUM CHLORIDE CRYS ER 20 MEQ PO TBCR
40.0000 meq | EXTENDED_RELEASE_TABLET | Freq: Once | ORAL | Status: AC
  Administered 2014-01-13: 40 meq via ORAL
  Filled 2014-01-13: qty 2

## 2014-01-13 MED ORDER — INSULIN ASPART 100 UNIT/ML ~~LOC~~ SOLN: 0.0000 [IU] | Freq: Every day | SUBCUTANEOUS | Status: DC

## 2014-01-13 MED ORDER — CABERGOLINE 0.5 MG PO TABS: 0.2500 mg | ORAL_TABLET | ORAL | Status: DC

## 2014-01-13 MED ORDER — ASPIRIN 325 MG PO TABS
325.0000 mg | ORAL_TABLET | Freq: Every day | ORAL | Status: DC
  Administered 2014-01-13 – 2014-01-18 (×6): 325 mg via ORAL
  Filled 2014-01-13 (×6): qty 1

## 2014-01-13 MED ORDER — ATORVASTATIN CALCIUM 40 MG PO TABS
40.0000 mg | ORAL_TABLET | Freq: Every day | ORAL | Status: DC
  Administered 2014-01-14 – 2014-01-16 (×2): 40 mg via ORAL
  Filled 2014-01-13 (×2): qty 1

## 2014-01-13 MED ORDER — STROKE: EARLY STAGES OF RECOVERY BOOK
Freq: Once | Status: DC
  Filled 2014-01-13: qty 1

## 2014-01-13 MED ORDER — INSULIN ASPART 100 UNIT/ML ~~LOC~~ SOLN
6.0000 [IU] | Freq: Three times a day (TID) | SUBCUTANEOUS | Status: DC
  Administered 2014-01-14 – 2014-01-15 (×5): 6 [IU] via SUBCUTANEOUS

## 2014-01-13 NOTE — H&P (Signed)
[facility] History and Physical  Patient: Blake Bell  RXV:[id number]  DOB: November 29, 1950  DOS: the patient was seen and examined on 01/13/2014 PCP: [name], MD  Chief Complaint: difficulty with speech  HPI: Blake Bell is a 63 y.o. male with Past medical history of OSA, HTN, DM2, morbid obesity, CKD, venous stasis ulcer and diastolic dysfunction. The pt presented with difficulty with speech that has been on going since last 2 days. He mentions that he woke up with this symptoms and has been having difficulty bringing out the right words. No difficulty with understanding or hearing or vision or swallowing.  Pt denies any fever, chills, headache, cough, chest pain, palpitation, shortness of breath, orthopnea, PND, nausea, vomiting, abdominal pain, diarrhea, constipation, active bleeding, burning urination, dizziness, pedal edema, no other focal neurological deficit.  No recent changes in his medication. He is compliant with his CPAP. No similar symptoms in the past.  The patient is coming from home. And at his baseline independent for most of his ADL.  Review of Systems: as mentioned in the history of present illness.  A Comprehensive review of the other systems is negative.  Past Medical History  Diagnosis Date  . Hypertension   . Diabetes mellitus without complication   . Sleep apnea     uses cpap, setting of 5  . Chronic kidney disease     ssees dr Florene Glen nephrology every 6-9 months  . Multiple wounds     on lower legs - sees [name] Pain at the [facility]   Past Surgical History  Procedure Laterality Date  . Colonscopy  6-7 yrs ago  . Colonoscopy N/A 04/05/2013    Procedure: COLONOSCOPY;  Surgeon: Juanita Craver, MD;  Location: [facility];  Service: Endoscopy;  Laterality: N/A;   Social History:  reports that he quit smoking about 43 years ago. His smoking use included Cigarettes. He has a .5 pack-year smoking history. He has never used smokeless  tobacco. He reports that he does not drink alcohol or use illicit drugs.  No Known Allergies  Family History  Problem Relation Age of Onset  . Diabetes Mother   . Diabetes Father   . Diabetes Sister   . Diabetes Brother   . Hypertension Mother   . Hypertension Father     Prior to Admission medications   Medication Sig Start Date End Date Taking? Authorizing Provider  Albiglutide (TANZEUM) 30 MG PEN Inject 30 mg into the skin once a week.   Yes Historical Provider, MD  cabergoline (DOSTINEX) 0.5 MG tablet Take 0.5 tablets (0.25 mg total) by mouth 2 (two) times a week. 07/12/13  Yes Geradine Girt, DO  furosemide (LASIX) 80 MG tablet Take 240 mg by mouth daily.   Yes Historical Provider, MD  hydrALAZINE (APRESOLINE) 100 MG tablet Take 1 tablet (100 mg total) by mouth every 8 (eight) hours. 07/12/13  Yes Jessica U Vann, DO  insulin glargine (LANTUS) 100 UNIT/ML injection Inject 0.3 mLs (30 Units total) into the skin daily. 07/12/13  Yes Geradine Girt, DO  labetalol (NORMODYNE) 200 MG tablet Take 200 mg by mouth 3 (three) times daily. 07/12/13  Yes Geradine Girt, DO  insulin aspart (NOVOLOG) 100 UNIT/ML injection Inject 6 Units into the skin 3 (three) times daily with meals.    Historical Provider, MD    Physical Exam: Filed Vitals:   01/13/14 1746 01/13/14 1800 01/13/14 1815 01/13/14 1925  BP: 179/94  194/110   Pulse: 93 93 94  Temp: 98.7 F (37.1 C)   98 F (36.7 C)  TempSrc: Oral     Resp: 30 25 19    SpO2: 94% 95% 93%     General: Alert, Awake and Oriented to Time, Place and Person. Appear in mild distress Eyes: PERRL,  ENT: Oral Mucosa clear moist. Neck: no JVD Cardiovascular: S1 and S2 Present, aortic systolic Murmur, Peripheral Pulses Present Respiratory: Bilateral Air entry equal and Decreased, bilateral basal Crackles, no wheezes Abdomen: Bowel Sound Present, Soft and Non tender Skin: No Rash Extremities: Bilateral leg wrapped, bilateral Pedal edema, no calf  tenderness Neurologic: Mental status alert awake and oriented, expressive aphasia, no dysarthria, Cranial Nerves pupils are reactive, cough present, gag present, orbicularis oculi normal, Motor strength bilaterally equal strength upper and lower extremity, Sensation bilaterally equal sensation to light touch, reflexes presents, babinski equivocal, Cerebellar test normal finger-nose-finger.  Labs on Admission:  CBC:  Recent Labs Lab 01/13/14 1757 01/13/14 1856  WBC 12.0*  --   NEUTROABS  --  9.7*  HGB 11.5*  --   HCT 35.2*  --   MCV 89.3  --   PLT 429*  --     CMP     Component Value Date/Time   NA 139 01/13/2014 1757   K 2.9* 01/13/2014 1757   CL 100 01/13/2014 1757   CO2 26 01/13/2014 1757   GLUCOSE 264* 01/13/2014 1757   BUN 16 01/13/2014 1757   CREATININE 2.00* 01/13/2014 1757   CALCIUM 9.0 01/13/2014 1757   PROT 7.0 01/13/2014 1757   ALBUMIN 2.5* 01/13/2014 1757   AST 15 01/13/2014 1757   ALT 10 01/13/2014 1757   ALKPHOS 88 01/13/2014 1757   BILITOT 0.3 01/13/2014 1757   GFRNONAA 34* 01/13/2014 1757   GFRAA 39* 01/13/2014 1757    No results found for this basename: LIPASE, AMYLASE,  in the last 168 hours No results found for this basename: AMMONIA,  in the last 168 hours  No results found for this basename: CKTOTAL, CKMB, CKMBINDEX, TROPONINI,  in the last 168 hours BNP (last 3 results)  Recent Labs  06/29/13 0330  PROBNP 1004.0*    Radiological Exams on Admission: Ct Head Wo Contrast  01/13/2014   CLINICAL DATA:  Slurred speech.  Altered mental status.  EXAM: CT HEAD WITHOUT CONTRAST  TECHNIQUE: Contiguous axial images were obtained from the base of the skull through the vertex without intravenous contrast.  COMPARISON:  Multiple exams, including 08/02/2013  FINDINGS: Small hypodensity in the head of the right caudate nucleus, image 16 of series 2, proximally 3 mm in diameter, not readily seen on prior exams, possibly a small lacunar infarct. Several hypodensities in the  left lentiform nucleus also demonstrate increase conspicuity and could represent subacute lacunar infarcts.  Periventricular white matter and corona radiata hypodensities favor chronic ischemic microvascular white matter disease. The brainstem, cerebellum, cerebral peduncle is, and thalami appear unremarkable.  Right eccentric similar lesion observed as on prior exams. No intracranial hemorrhage arm other mass lesion identified.  IMPRESSION: 1. Increase conspicuity of small hypodensities in the left lentiform nucleus and right caudate head, suspicious for small subacute lacunar infarcts. 2. Periventricular white matter and corona radiata hypodensities favor chronic ischemic microvascular white matter disease. 3. 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 These results were called by telephone at the time of interpretation on 01/13/2014 at 7:04 pm to Dr. Riki Altes , who verbally acknowledged these results.   Electronically Signed   By:  Sherryl Barters M.D.   On: 01/13/2014 19:08   EKG: Independently reviewed. normal sinus rhythm, nonspecific ST and T waves changes. Assessment/Plan Principal Problem:   CVA (cerebral infarction) Active Problems:   OSA (obstructive sleep apnea)   CKD (chronic kidney disease) stage 3, GFR 30-59 ml/min   Morbid obesity   Venous stasis ulcers   DM (diabetes mellitus), type 2   1. CVA (cerebral infarction) The patient is presenting with complaints of expressive aphasia. His CT scan is positive for subacute left lentiform and right caudate nucleus infarct. Is not on aspirin. He has diabetes, dyslipidemia, morbid obesity, sleep apnea, hypertension as risk factors. Currently on place him on aspirin, admit to telemetry, MRI brain, neurologic consultation, Lipitor 40, permissive hypertension. PTOT and speech consultation.  2. Diabetes mellitus. Patient has past stroke evaluation. At present aspiration precaution and diabetic diet. Continue with  home insulin and sliding scale.  3. Obstructive sleep apnea. Last sleep study 2004 may require repeat sleep study as she is only using 5 cm of pressure by prior to hydration was 15 cm.  4. Hypertension. Continue home medications  5. Chronic kidney disease Stable. Avoid nephrotoxic medication.  6. Venous stasis ulcer. Diastolic dysfunction. Continue Lasix at home dose. Continue with the catheter  Consults: Neurology  DVT Prophylaxis: subcutaneous Heparin Nutrition: Cardiac and diabetic diet  Code Status: Full  Family Communication: Family was present at bedside, opportunity was given to ask question and all questions were answered satisfactorily at the time of interview. Disposition: Admitted to inpatient in telemetry unit.  Author: Berle Mull, MD [facility] Pager: [phone] 01/13/2014, 8:41 PM    If 7PM-7AM, please contact night-coverage [email or web address] Password [email or web address]  **Disclaimer: This note may have been dictated with voice recognition software. Similar sounding words can inadvertently be transcribed and this note may contain transcription errors which may not have been corrected upon publication of note.**

## 2014-01-13 NOTE — Progress Notes (Signed)
Patient has arrived to [facility] via [facility] from [facility]. Patient assessed and oriented to room and unit. Call bell in reach. Will continue to monitor patient. Burnell Blanks, RN

## 2014-01-13 NOTE — ED Notes (Signed)
Pt unable to urinate, attempted too, but was unsuccessful using urinal with assistance

## 2014-01-13 NOTE — ED Notes (Signed)
Pt presents from home, c/o slurred speech, change in mental status, pt's family called PCP who advised them to bring pt to ER for evaluation for "possible mini-stroke." Symptoms onset yesterday, worsened today. Pt AOx4 at the moment, mild shortness of breath, denies HA, no facial drop noted, smile symmetrical, family endorse pt speech pattern being below baseline, EKG done, receiving emergent care.

## 2014-01-13 NOTE — ED Provider Notes (Signed)
CSN: [id number]     Arrival date & time 01/13/14  1730 History   First MD Initiated Contact with Patient 01/13/14 1811     Chief Complaint  Patient presents with  . Aphasia    Slurred speech  . Altered Mental Status  . Shortness of Breath     (Consider location/radiation/quality/duration/timing/severity/associated sxs/prior Treatment) HPI 63 year old male with history of morbid obesity, sleep apnea, diabetes, woke up yesterday with expressive aphasia without dysarthria which has persisted all day yesterday and all day today with no receptive aphasia no headache no trauma no pain no confusion no chest pain no shortness of breath no cough no fever no vomiting no diarrhea no lateralizing weakness or numbness or incoordination no change in vision or swallowing or understanding. There's been no treatment prior to arrival. Past Medical History  Diagnosis Date  . Hypertension   . Diabetes mellitus without complication   . Sleep apnea     uses cpap, setting of 5  . Chronic kidney disease     ssees dr Florene Glen nephrology every 6-9 months  . Multiple wounds     on lower legs - sees [name] Pain at the [facility]   Past Surgical History  Procedure Laterality Date  . Colonscopy  6-7 yrs ago  . Colonoscopy N/A 04/05/2013    Procedure: COLONOSCOPY;  Surgeon: Juanita Craver, MD;  Location: [facility];  Service: Endoscopy;  Laterality: N/A;   Family History  Problem Relation Age of Onset  . Diabetes Mother   . Diabetes Father   . Diabetes Sister   . Diabetes Brother   . Hypertension Mother   . Hypertension Father    History  Substance Use Topics  . Smoking status: Former Smoker -- 0.25 packs/day for 2 years    Types: Cigarettes    Quit date: 06/17/1970  . Smokeless tobacco: Never Used  . Alcohol Use: No    Review of Systems  10 Systems reviewed and are negative for acute change except as noted in the HPI.  Allergies  Review of patient's allergies indicates no known  allergies.  Home Medications   Prior to Admission medications   Medication Sig Start Date End Date Taking? Authorizing Provider  Albiglutide (TANZEUM) 30 MG PEN Inject 30 mg into the skin once a week.   Yes Historical Provider, MD  cabergoline (DOSTINEX) 0.5 MG tablet Take 0.5 tablets (0.25 mg total) by mouth 2 (two) times a week. 07/12/13  Yes Geradine Girt, DO  furosemide (LASIX) 80 MG tablet Take 240 mg by mouth daily.   Yes Historical Provider, MD  hydrALAZINE (APRESOLINE) 100 MG tablet Take 1 tablet (100 mg total) by mouth every 8 (eight) hours. 07/12/13  Yes Jessica U Vann, DO  insulin glargine (LANTUS) 100 UNIT/ML injection Inject 0.3 mLs (30 Units total) into the skin daily. 07/12/13  Yes Geradine Girt, DO  labetalol (NORMODYNE) 200 MG tablet Take 200 mg by mouth 3 (three) times daily. 07/12/13  Yes Geradine Girt, DO  insulin aspart (NOVOLOG) 100 UNIT/ML injection Inject 6 Units into the skin 3 (three) times daily with meals.    Historical Provider, MD   BP 171/94  Pulse 90  Temp(Src) 98 F (36.7 C) (Axillary)  Resp 22  Ht 5' 9.5" (1.765 m)  Wt 363 lb 12.8 oz (165.019 kg)  BMI 52.97 kg/m2  SpO2 98% Physical Exam  Nursing note and vitals reviewed. Constitutional: He is oriented to person, place, and time.  Awake, alert, nontoxic appearance  with mild expressive aphasia  HENT:  Head: Atraumatic.  Mouth/Throat: No oropharyngeal exudate.  Eyes: EOM are normal. Pupils are equal, round, and reactive to light. Right eye exhibits no discharge. Left eye exhibits no discharge.  Neck: Neck supple.  Cardiovascular: Normal rate and regular rhythm.   No murmur heard. Pulmonary/Chest: Effort normal and breath sounds normal. No stridor. No respiratory distress. He has no wheezes. He has no rales. He exhibits no tenderness.  Abdominal: Soft. Bowel sounds are normal. He exhibits no mass. There is no tenderness. There is no rebound.  Musculoskeletal: He exhibits no tenderness.  Baseline ROM,  moves extremities with no obvious new focal weakness.  Lymphadenopathy:    He has no cervical adenopathy.  Neurological: He is alert and oriented to person, place, and time.  Awake, alert, cooperative and aware of situation; motor strength bilaterally; sensation normal to light touch bilaterally; peripheral visual fields full to confrontation; no facial asymmetry; tongue midline; major cranial nerves appear intact; no pronator drift, normal finger to nose bilaterally; patient with mild expressive aphasia  Skin: No rash noted.  Psychiatric: He has a normal mood and affect.    ED Course  Procedures (including critical care time) Patient / Family / Caregiver understand and agree with initial ED impression and plan with expectations set for ED visit. Not Code Stroke since LKW 2 nights ago. D/w [facility] for transfer and [facility] who will consult at [place] Patient / Family / Caregiver informed of clinical course, understand medical decision-making process, and agree with plan.Pt stable in ED with no significant deterioration in condition. Labs Review Labs Reviewed  CBC - Abnormal; Notable for the following:    WBC 12.0 (*)    RBC 3.94 (*)    Hemoglobin 11.5 (*)    HCT 35.2 (*)    RDW 15.8 (*)    Platelets 429 (*)    All other components within normal limits  COMPREHENSIVE METABOLIC PANEL - Abnormal; Notable for the following:    Potassium 2.9 (*)    Glucose, Bld 264 (*)    Creatinine, Ser 2.00 (*)    Albumin 2.5 (*)    GFR calc non Af Amer 34 (*)    GFR calc Af Amer 39 (*)    All other components within normal limits  URINALYSIS, ROUTINE W REFLEX MICROSCOPIC - Abnormal; Notable for the following:    Glucose, UA 250 (*)    Hgb urine dipstick SMALL (*)    Protein, ur >300 (*)    All other components within normal limits  DIFFERENTIAL - Abnormal; Notable for the following:    Neutrophils Relative % 80 (*)    Neutro Abs 9.7 (*)    Lymphocytes Relative 10 (*)    All other components  within normal limits  HEMOGLOBIN A1C - Abnormal; Notable for the following:    Hemoglobin A1C 9.9 (*)    Mean Plasma Glucose 237 (*)    All other components within normal limits  LIPID PANEL - Abnormal; Notable for the following:    Cholesterol 238 (*)    LDL Cholesterol 145 (*)    All other components within normal limits  URINE MICROSCOPIC-ADD ON - Abnormal; Notable for the following:    Bacteria, UA FEW (*)    Casts HYALINE CASTS (*)    All other components within normal limits  GLUCOSE, CAPILLARY - Abnormal; Notable for the following:    Glucose-Capillary 270 (*)    All other components within normal limits  GLUCOSE,  CAPILLARY - Abnormal; Notable for the following:    Glucose-Capillary 197 (*)    All other components within normal limits  BASIC METABOLIC PANEL - Abnormal; Notable for the following:    Potassium 3.4 (*)    Glucose, Bld 142 (*)    Creatinine, Ser 2.09 (*)    GFR calc non Af Amer 32 (*)    GFR calc Af Amer 37 (*)    All other components within normal limits  GLUCOSE, CAPILLARY - Abnormal; Notable for the following:    Glucose-Capillary 161 (*)    All other components within normal limits  GLUCOSE, CAPILLARY - Abnormal; Notable for the following:    Glucose-Capillary 113 (*)    All other components within normal limits  CBG MONITORING, ED - Abnormal; Notable for the following:    Glucose-Capillary 260 (*)    All other components within normal limits  CBG MONITORING, ED - Abnormal; Notable for the following:    Glucose-Capillary 192 (*)    All other components within normal limits  MRSA PCR SCREENING  ETHANOL  PROTIME-INR  APTT  URINE RAPID DRUG SCREEN (HOSP PERFORMED)  BASIC METABOLIC PANEL  MAGNESIUM  I-STAT TROPOININ, ED  I-STAT TROPOININ, ED    Imaging Review Dg Chest 2 View  01/13/2014   CLINICAL DATA:  Stroke, weakness  EXAM: CHEST  2 VIEW  COMPARISON:  Chest radiograph July 01, 2013  FINDINGS: MR cardiomegaly, a with consideration to this  low inspiratory portable examination with crowded, central engorged vascular markings. Mediastinal silhouette is nonsuspicious. No pleural effusions. Rounded airspace opacity in right lung base. No pneumothorax.  Interval removal of right PICC. Soft tissue planes and included osseous structures are nonsuspicious.  IMPRESSION: Similar cardiomegaly with central pulmonary vascular congestion. Rounded airspace opacity in right lung base may reflect confluence of vascular shadows, less likely confluent edema or pneumonia.  Interval removal of right PICC.   Electronically Signed   By: Elon Alas   On: 01/13/2014 20:57   Ct Head Wo Contrast  01/13/2014   CLINICAL DATA:  Slurred speech.  Altered mental status.  EXAM: CT HEAD WITHOUT CONTRAST  TECHNIQUE: Contiguous axial images were obtained from the base of the skull through the vertex without intravenous contrast.  COMPARISON:  Multiple exams, including 08/02/2013  FINDINGS: Small hypodensity in the head of the right caudate nucleus, image 16 of series 2, proximally 3 mm in diameter, not readily seen on prior exams, possibly a small lacunar infarct. Several hypodensities in the left lentiform nucleus also demonstrate increase conspicuity and could represent subacute lacunar infarcts.  Periventricular white matter and corona radiata hypodensities favor chronic ischemic microvascular white matter disease. The brainstem, cerebellum, cerebral peduncle is, and thalami appear unremarkable.  Right eccentric similar lesion observed as on prior exams. No intracranial hemorrhage arm other mass lesion identified.  IMPRESSION: 1. Increase conspicuity of small hypodensities in the left lentiform nucleus and right caudate head, suspicious for small subacute lacunar infarcts. 2. Periventricular white matter and corona radiata hypodensities favor chronic ischemic microvascular white matter disease. 3. 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 These  results were called by telephone at the time of interpretation on 01/13/2014 at 7:04 pm to Dr. Riki Altes , who verbally acknowledged these results.   Electronically Signed   By: Sherryl Barters M.D.   On: 01/13/2014 19:08   Mr Brain Ltd W/o Cm  01/14/2014   CLINICAL DATA:  Speech difficulty  EXAM: MRI HEAD WITHOUT  CONTRAST  TECHNIQUE: Multiplanar, multiecho pulse sequences of the brain and surrounding structures were obtained without intravenous contrast.  COMPARISON:  Prior CT from earlier the same day as well as previous MRI from 08/02/2013.  FINDINGS: 9 mm focus of restricted diffusion present within the posterior aspect of the putamen, consistent with acute ischemic infarct. There is superior extension into the left corona radiata with probable involvement of the body of the left caudate (series 4, image 19). These correspond with previously seen hypodensities seen on prior CT. No definite associated hemorrhage. No significant mass effect. No other acute ischemic infarct.  Sagittal T1 weighted view of the brain demonstrates no other acute abnormality. Sellar enlargement again noted. Craniocervical junction within normal limits. Probable remote lacunar infarct noted within the pons.  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 No significant mass effect.   Electronically Signed   By: Jeannine Boga M.D.   On: 01/14/2014 00:22     EKG Interpretation None     ECG in Muse but not crossing to chart; Sinus rhythm, ventricular rate 92, PVCs, nonspecific ST changes, no significant change c/w prior ECG  MDM   Final diagnoses:  Stroke    The patient appears reasonably stabilized for transfer considering the current resources, flow, and capabilities available in the ED at this time, and I doubt any other [facility] requiring further screening and/or treatment in the ED prior to transfer.    Babette Relic, MD 01/14/14 2033

## 2014-01-13 NOTE — Progress Notes (Signed)
This pt exceeds the MRI table limit of 350 lbs. The 3T at [facility] however can accomodates up to 500lbs. Rn in Er Made aware.

## 2014-01-14 DIAGNOSIS — I6340 Cerebral infarction due to embolism of unspecified cerebral artery: Secondary | ICD-10-CM

## 2014-01-14 DIAGNOSIS — G4733 Obstructive sleep apnea (adult) (pediatric): Secondary | ICD-10-CM

## 2014-01-14 DIAGNOSIS — E1129 Type 2 diabetes mellitus with other diabetic kidney complication: Secondary | ICD-10-CM

## 2014-01-14 DIAGNOSIS — N189 Chronic kidney disease, unspecified: Secondary | ICD-10-CM

## 2014-01-14 DIAGNOSIS — I517 Cardiomegaly: Secondary | ICD-10-CM

## 2014-01-14 LAB — BASIC METABOLIC PANEL
ANION GAP: 11 (ref 5–15)
BUN: 19 mg/dL (ref 6–23)
CALCIUM: 8.8 mg/dL (ref 8.4–10.5)
CO2: 28 mEq/L (ref 19–32)
CREATININE: 2.09 mg/dL — AB (ref 0.50–1.35)
Chloride: 103 mEq/L (ref 96–112)
GFR calc Af Amer: 37 mL/min — ABNORMAL LOW (ref >90)
GFR calc non Af Amer: 32 mL/min — ABNORMAL LOW (ref >90)
Glucose, Bld: 142 mg/dL — ABNORMAL HIGH (ref 70–99)
Potassium: 3.4 mEq/L — ABNORMAL LOW (ref 3.7–5.3)
Sodium: 142 mEq/L (ref 137–147)

## 2014-01-14 LAB — HEMOGLOBIN A1C
HEMOGLOBIN A1C: 9.9 % — AB (ref <5.7)
MEAN PLASMA GLUCOSE: 237 mg/dL — AB (ref <117)

## 2014-01-14 LAB — GLUCOSE, CAPILLARY
GLUCOSE-CAPILLARY: 270 mg/dL — AB (ref 70–99)
Glucose-Capillary: 197 mg/dL — ABNORMAL HIGH (ref 70–99)
Glucose-Capillary: 161 mg/dL — ABNORMAL HIGH (ref 70–99)
Glucose-Capillary: 113 mg/dL — ABNORMAL HIGH (ref 70–99)
Glucose-Capillary: 154 mg/dL — ABNORMAL HIGH (ref 70–99)

## 2014-01-14 LAB — LIPID PANEL
CHOLESTEROL: 238 mg/dL — AB (ref 0–200)
HDL: 64 mg/dL (ref >39)
LDL Cholesterol: 145 mg/dL — ABNORMAL HIGH (ref 0–99)
Total CHOL/HDL Ratio: 3.7 RATIO
Triglycerides: 143 mg/dL (ref <150)
VLDL: 29 mg/dL (ref 0–40)

## 2014-01-14 LAB — MRSA PCR SCREENING: MRSA by PCR: NEGATIVE

## 2014-01-14 NOTE — Evaluation (Signed)
Speech Language Pathology Evaluation Patient Details Name: Blake Bell MRN: [id number] DOB: 02/21/1951 Today's Date: 01/14/2014 Time: 0347-4259 SLP Time Calculation (min): 30 min  Problem List:  Patient Active Problem List   Diagnosis Date Noted  . CVA (cerebral infarction) 01/13/2014  . AKI (acute kidney injury) 07/03/2013  . Hypernatremia 07/03/2013  . Morbid obesity 07/03/2013  . Venous stasis ulcers 07/03/2013  . DM (diabetes mellitus), type 2 07/03/2013  . 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 Sellar or suprasellar mass 06/22/2013  . CKD (chronic kidney disease) stage 3, GFR 30-59 ml/min 06/22/2013  . Acute respiratory failure 06/22/2013  . Seizure 06/21/2013  . Altered mental status 06/21/2013  . DKA (diabetic ketoacidoses) 06/21/2013  . Hypercarbia 06/21/2013  . OSA (obstructive sleep apnea) 05/25/2013   Past Medical History:  Past Medical History  Diagnosis Date  . Hypertension   . Diabetes mellitus without complication   . Sleep apnea     uses cpap, setting of 5  . Chronic kidney disease     ssees dr Florene Glen nephrology every 6-9 months  . Multiple wounds     on lower legs - sees [name] Pain at the [facility]   Past Surgical History:  Past Surgical History  Procedure Laterality Date  . Colonscopy  6-7 yrs ago  . Colonoscopy N/A 04/05/2013    Procedure: COLONOSCOPY;  Surgeon: Juanita Craver, MD;  Location: [facility];  Service: Endoscopy;  Laterality: N/A;   HPI:  Blake Bell is a 63 y.o. male, right handed, with a past medical history significant for HTN, DM type II, CKD, obstructive sleep apnea on CPAP, obesity, and venous stasis ulcer, transferred to [facility] for further stroke evaluation.management  Patient with 2-3 day h/o difficulty expressing himself verbally, stating the day of admission he could barely get out any words.   Assessment / Plan / Recommendation Clinical Impression  Patient exhibits a mild to moderate Broca's type aphasia,  characterized by nonfluent, halting speech, but with a burst of fluent words (a phrase) intermittently.  Receptive language is intact, and pt is able to repeat relatively well, with only phonemic paraphasias noted.  Automatic sequences are intact, but occassional perseveration is noted.  Patient is aware of his errors and becomes frustrated at times, stating, "I know, but it won't come out."  Pt and wife were eager for therapy and education re: aphasia.  Pt is an excellent candidate for OP speech therapy after d/c home.    SLP Assessment  Patient needs continued Speech Lanaguage Pathology Services    Follow Up Recommendations  Outpatient SLP    Frequency and Duration min 2x/week  2 weeks   Pertinent Vitals/Pain No c/o pain; VSS   SLP Goals  SLP Goals Potential to Achieve Goals: Good Progress/Goals/Alternative treatment plan discussed with pt/caregiver and they: Agree  SLP Evaluation Prior Functioning  Cognitive/Linguistic Baseline: Within functional limits Type of Home: House  Lives With: Spouse Available Help at Discharge: Family;Available 24 hours/day   Cognition  Overall Cognitive Status: Within Functional Limits for tasks assessed Arousal/Alertness: Awake/alert Orientation Level: Oriented X4 Memory: Appears intact Awareness: Appears intact Problem Solving: Appears intact Behaviors: Perseveration Safety/Judgment: Appears intact    Comprehension  Auditory Comprehension Overall Auditory Comprehension: Appears within functional limits for tasks assessed Yes/No Questions: Within Functional Limits Commands: Within Functional Limits Conversation: Complex EffectiveTechniques: Extra processing time;Visual/Gestural cues Visual Recognition/Discrimination Discrimination: Not tested Reading Comprehension Reading Status: Not tested    Expression Expression Primary Mode of Expression: Verbal Verbal Expression Overall Verbal Expression: Impaired  Initiation: No  impairment Automatic Speech: Singing;Day of week Level of Generative/Spontaneous Verbalization: Phrase Repetition: Impaired Level of Impairment: Sentence level (phonemic paraphasic errors) Naming: Impairment Responsive: 76-100% accurate Confrontation: Within functional limits Convergent: 75-100% accurate Divergent: 75-100% accurate Verbal Errors: Phonemic paraphasias;Perseveration;Aware of errors Pragmatics: No impairment Effective Techniques: Phonemic cues;Articulatory cues;Sentence completion Written Expression Dominant Hand: Right Written Expression: Exceptions to [facility] Self Formulation Ability: Word;Phrase   Oral / Motor Oral Motor/Sensory Function Overall Oral Motor/Sensory Function: Appears within functional limits for tasks assessed Motor Speech Overall Motor Speech: Impaired Respiration: Within functional limits Phonation: Normal Resonance: Within functional limits Articulation: Impaired Level of Impairment: Phrase Intelligibility: Intelligible Motor Planning: Impaired Level of Impairment: Phrase Motor Speech Errors: Groping for words;Inconsistent   GO     Quinn Axe T 01/14/2014, 1:45 PM

## 2014-01-14 NOTE — Progress Notes (Signed)
Utilization review completed. Ilay Capshaw, RN, BSN. 

## 2014-01-14 NOTE — Progress Notes (Signed)
Stroke Team Progress Note  HISTORY Silvio Sausedo is a 63 y.o. male, right handed, with a past medical history significant for HTN, DM type II, CKD, obstructive sleep apnea on CPAP, obesity, and venous stasis ulcer, transferred to [facility] for further stroke evaluation.management.  [name] stated that 3 days prior to admission he started noticing difficulty expressing himself, " couldn't get my words out even knowing what I wanted to say". He said that this problem got progressively worse and pt's family called PCP who advised them to bring pt to ER for evaluation for "possible mini-stroke  Pt denied associated HA, vertigo, double vision, difficulty swallowing, confusion, imbalance, focal weakness or numbness, vision disturbances, chest pain, SOB, or palpitations.  CT brain " increase conspicuity of small hypodensities in the left lentiform nucleus and right caudate head, suspicious for small subacute lacunar infarcts".  Started on aspirin during this admission.   Date last known well: 01/11/14  Time last known well: uncertain  tPA Given: no, out of the window.    SUBJECTIVE The patient's wife is present in the room. The patient is still having significant word finding difficulties. He has multiple vascular risk factors including morbid obesity, sleep apnea, hypertension, diabetes hyperlipidemia all of which are poorly controlled  OBJECTIVE Most recent Vital Signs: Filed Vitals:   01/13/14 1925 01/13/14 2120 01/13/14 2140 01/13/14 2229  BP:  192/114 216/126 154/76  Pulse:  92  91  Temp: 98 F (36.7 C)   98.9 F (37.2 C)  TempSrc:    Oral  Resp:  28  22  Height:    5' 9.5" (1.765 m)  Weight:    363 lb 12.8 oz (165.019 kg)  SpO2:  96%  94%   CBG (last 3)   Recent Labs  01/13/14 2106 01/14/14 0110 01/14/14 0653  GLUCAP 192* 270* 197*    IV Fluid Intake:     MEDICATIONS  .  stroke: mapping our early stages of recovery book   Does not apply Once  . aspirin  300 mg Rectal  Daily   Or  . aspirin  325 mg Oral Daily  . atorvastatin  40 mg Oral q1800  . furosemide  240 mg Oral Daily  . heparin  5,000 Units Subcutaneous 3 times per day  . hydrALAZINE  100 mg Oral 3 times per day  . insulin aspart  0-15 Units Subcutaneous TID WC  . insulin aspart  0-5 Units Subcutaneous QHS  . insulin aspart  6 Units Subcutaneous TID WC  . insulin glargine  30 Units Subcutaneous Daily  . labetalol  200 mg Oral TID   PRN:  senna-docusate  Diet:    heart healthy carb modified diet with thin liquids Activity: Up with assistance DVT Prophylaxis:  Subcutaneous heparin  CLINICALLY SIGNIFICANT STUDIES Basic Metabolic Panel:  Recent Labs Lab 01/13/14 1757  NA 139  K 2.9*  CL 100  CO2 26  GLUCOSE 264*  BUN 16  CREATININE 2.00*  CALCIUM 9.0   Liver Function Tests:  Recent Labs Lab 01/13/14 1757  AST 15  ALT 10  ALKPHOS 88  BILITOT 0.3  PROT 7.0  ALBUMIN 2.5*   CBC:  Recent Labs Lab 01/13/14 1757 01/13/14 1856  WBC 12.0*  --   NEUTROABS  --  9.7*  HGB 11.5*  --   HCT 35.2*  --   MCV 89.3  --   PLT 429*  --    Coagulation:  Recent Labs Lab 01/13/14 1856  LABPROT 13.3  INR 1.01   Cardiac Enzymes: No results found for this basename: CKTOTAL, CKMB, CKMBINDEX, TROPONINI,  in the last 168 hours Urinalysis:  Recent Labs Lab 01/13/14 2036  COLORURINE YELLOW  LABSPEC 1.027  PHURINE 7.0  GLUCOSEU 250*  HGBUR SMALL*  BILIRUBINUR NEGATIVE  KETONESUR NEGATIVE  PROTEINUR >300*  UROBILINOGEN 0.2  NITRITE NEGATIVE  LEUKOCYTESUR NEGATIVE   Lipid Panel    Component Value Date/Time   CHOL 238* 01/14/2014 0548   TRIG 143 01/14/2014 0548   HDL 64 01/14/2014 0548   CHOLHDL 3.7 01/14/2014 0548   VLDL 29 01/14/2014 0548   LDLCALC 145* 01/14/2014 0548   HgbA1C  Lab Results  Component Value Date   HGBA1C 10.5* 07/04/2013    Urine Drug Screen:     Component Value Date/Time   LABOPIA NONE DETECTED 01/13/2014 2036   COCAINSCRNUR NONE DETECTED 01/13/2014 2036    LABBENZ NONE DETECTED 01/13/2014 2036   AMPHETMU NONE DETECTED 01/13/2014 2036   THCU NONE DETECTED 01/13/2014 2036   LABBARB NONE DETECTED 01/13/2014 2036    Alcohol Level:  Recent Labs Lab 01/13/14 1856  ETH <11    Dg Chest 2 View 01/13/2014    Similar cardiomegaly with central pulmonary vascular congestion. Rounded airspace opacity in right lung base may reflect confluence of vascular shadows, less likely confluent edema or pneumonia.  Interval removal of right PICC.       Ct Head Wo Contrast 01/13/2014    1. Increase conspicuity of small hypodensities in the left lentiform nucleus and right caudate head, suspicious for small subacute lacunar infarcts.  2. Periventricular white matter and corona radiata hypodensities favor chronic ischemic microvascular white matter disease.  3. 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    Mr Brain Ltd W/o Cm 01/14/2014    Acute ischemic infarct involving the right putamen with superior extension into the right corona radiata. No significant mass effect.      Carotid Doppler - pending  2D Echocardiogram  pending  CXR    EKG - sinus rhythm rate 92 beats per minute. For complete results please see formal report.   Therapy Recommendations - pending  Physical Exam   Morbidly obese African American male.Awake alert. Afebrile. Head is nontraumatic. Neck is supple without bruit. Hearing is normal. Cardiac exam no murmur or gallop. Lungs are clear to auscultation. Distal pulses are well felt. NEUROLOGIC:   MENTAL STATUS: awake, alert, oriented,  follows simple commands, mild expressive aphasia. With fluctuating word finding difficulties. Able to speak short sentences. Good naming, comprehension and repetition the CRANIAL NERVES: pupils equal and reactive to light,extraocular muscles intact, fundi were not visualized. Vision acuity and fields appear normal. facial sensation normal, uvula midline, tongue midline. Mild right  facial droop. MOTOR: normal bulk and tone, Strength - 5/5 mild difficulties with fine motor movements of the right hand. Orbits left or right upper extremity. Deep tendon reflexes are symmetric. SENSORY: normal and symmetric to light touch  COORDINATION: finger-nose-finger normal - heel to shin normal  REFLEXES: deep tendon reflexes present and symmetric - no babinski    ASSESSMENT Mr. Andrus Sharp is a 63 y.o. male presenting with speech difficulties. TPA therapy was not initiated secondary to late presentation.MRI - Increase conspicuity of small hypodensities in the left lentiform nucleus and right caudate head, suspicious for small subacute lacunar infarcts. Infarct felt to be thrombotic secondary to small vessel disease but a large lacunar infarct. On no antithrombotics prior to admission. Now on  aspirin 325 mg orally every day for secondary stroke prevention. Patient with resultant aphasia. Stroke work up underway.   Chronic kidney disease - Creatinine 2.0  Obesity  Obstructive sleep apnea  Diabetes mellitus - hemoglobin A1c 10.5  Hyperlipidemia - cholesterol 238; LDL 145  Hypertension   Hospital day # 1  TREATMENT/PLAN  Continue aspirin 325 mg orally every day for secondary stroke prevention.  Await 2-D echo, carotid Dopplers, and therapy evaluations  Needs better diabetic control  Plan TCD's instead of MRA. (unable to do CTA secondary to chronic kidney disease )  Encourage weight loss  Mikey Bussing PA-C [facility] Pager [phone] 01/14/2014, 7:54 AM  I have personally examined this patient, reviewed notes, independently viewed imaging studies, participated in medical decision making and plan of care. I have made any additions or clarifications directly to the above note. Agree with note above. I had a long discussion with the patient regarding his multiple stroke risk factors and need for aggressive risk factor modification and lifestyle  changes. He has a 10-15% risk of having a stroke/TIA over the next one year with most of that risk being upfront. He was counseled to be compliant with his medications, maintain aggressive risk factor control and make lifestyle changes to lose weight and exercise regularly.  Antony Contras, MD Medical Director [facility] Pager: [phone] 01/14/2014 11:29 AM  SIGNED    To contact Stroke Continuity provider, please refer to [email or web address] After hours, contact General Neurology

## 2014-01-14 NOTE — Progress Notes (Signed)
  Echocardiogram 2D Echocardiogram has been performed.  Blake Bell 01/14/2014, 10:25 AM

## 2014-01-14 NOTE — Progress Notes (Signed)
[facility] PROGRESS NOTE   Blake Bell EQA:[id number] DOB: Nov 04, 1950 DOA: 01/13/2014 PCP: [name], MD  HPI/Subjective: Seen with wife at bedside. Still has difficulty finding words, but better than yesterday.   Assessment/Plan: Principal Problem:   CVA (cerebral infarction) Active Problems:   OSA (obstructive sleep apnea)   CKD (chronic kidney disease) stage 3, GFR 30-59 ml/min   Morbid obesity   Venous stasis ulcers   DM (diabetes mellitus), type 2    CVA (cerebral infarction)  The patient is presenting with complaints of expressive aphasia.  His CT scan is positive for subacute left lentiform and right caudate nucleus infarct.  He has diabetes, dyslipidemia, morbid obesity, sleep apnea, hypertension as risk factors.  PTOT and speech consultation.  Per neurology recommendation, full dose of aspirin and statins  Diabetes mellitus Patient has past stroke evaluation.  At present aspiration precaution and diabetic diet.  Continue with home insulin and sliding scale.   Obstructive sleep apnea Last sleep study 2004 may require repeat sleep study as she is only using 5 cm of pressure by prior to hydration was 15 cm.   Hypertension Continue home medications   Chronic kidney disease  Creatinine is about to baseline (1.7-2.0). Continue Lasix, elevate lower extremities.  Hypokalemia Repleted orally and parenterally, check BMP.  Venous stasis ulcer Diastolic dysfunction.  Continue Lasix at home dose.  Continue with the catheter   Code Status: Full code Family Communication: Plan discussed with the patient. Disposition Plan: Remains inpatient   Consultants:  Neurology  Procedures:  None  Antibiotics:  None   Objective: Filed Vitals:   01/14/14 1005  BP: 164/98  Pulse: 70  Temp: 98.2 F (36.8 C)  Resp: 22    Intake/Output Summary (Last 24 hours) at 01/14/14 1114 Last data filed at 01/14/14 9798  Gross per 24 hour    Intake    360 ml  Output      0 ml  Net    360 ml   Filed Weights   01/13/14 2229  Weight: 165.019 kg (363 lb 12.8 oz)    Exam: General: Alert and awake, oriented x3, not in any acute distress. HEENT: anicteric sclera, pupils reactive to light and accommodation, EOMI CVS: S1-S2 clear, no murmur rubs or gallops Chest: clear to auscultation bilaterally, no wheezing, rales or rhonchi Abdomen: soft nontender, nondistended, normal bowel sounds, no organomegaly Extremities: no cyanosis, clubbing or edema noted bilaterally Neuro: Cranial nerves II-XII intact, no focal neurological deficits  Data Reviewed: Basic Metabolic Panel:  Recent Labs Lab 01/13/14 1757  NA 139  K 2.9*  CL 100  CO2 26  GLUCOSE 264*  BUN 16  CREATININE 2.00*  CALCIUM 9.0   Liver Function Tests:  Recent Labs Lab 01/13/14 1757  AST 15  ALT 10  ALKPHOS 88  BILITOT 0.3  PROT 7.0  ALBUMIN 2.5*   No results found for this basename: LIPASE, AMYLASE,  in the last 168 hours No results found for this basename: AMMONIA,  in the last 168 hours CBC:  Recent Labs Lab 01/13/14 1757 01/13/14 1856  WBC 12.0*  --   NEUTROABS  --  9.7*  HGB 11.5*  --   HCT 35.2*  --   MCV 89.3  --   PLT 429*  --    Cardiac Enzymes: No results found for this basename: CKTOTAL, CKMB, CKMBINDEX, TROPONINI,  in the last 168 hours BNP (last 3 results)  Recent Labs  06/29/13 0330  PROBNP 1004.0*   CBG:  Recent Labs Lab 01/13/14 1740 01/13/14 2106 01/14/14 0110 01/14/14 0653  GLUCAP 260* 192* 270* 197*    Micro No results found for this or any previous visit (from the past 240 hour(s)).   Studies: Dg Chest 2 View  01/13/2014   CLINICAL DATA:  Stroke, weakness  EXAM: CHEST  2 VIEW  COMPARISON:  Chest radiograph July 01, 2013  FINDINGS: MR cardiomegaly, a with consideration to this low inspiratory portable examination with crowded, central engorged vascular markings. Mediastinal silhouette is nonsuspicious.  No pleural effusions. Rounded airspace opacity in right lung base. No pneumothorax.  Interval removal of right PICC. Soft tissue planes and included osseous structures are nonsuspicious.  IMPRESSION: Similar cardiomegaly with central pulmonary vascular congestion. Rounded airspace opacity in right lung base may reflect confluence of vascular shadows, less likely confluent edema or pneumonia.  Interval removal of right PICC.   Electronically Signed   By: Elon Alas   On: 01/13/2014 20:57   Ct Head Wo Contrast  01/13/2014   CLINICAL DATA:  Slurred speech.  Altered mental status.  EXAM: CT HEAD WITHOUT CONTRAST  TECHNIQUE: Contiguous axial images were obtained from the base of the skull through the vertex without intravenous contrast.  COMPARISON:  Multiple exams, including 08/02/2013  FINDINGS: Small hypodensity in the head of the right caudate nucleus, image 16 of series 2, proximally 3 mm in diameter, not readily seen on prior exams, possibly a small lacunar infarct. Several hypodensities in the left lentiform nucleus also demonstrate increase conspicuity and could represent subacute lacunar infarcts.  Periventricular white matter and corona radiata hypodensities favor chronic ischemic microvascular white matter disease. The brainstem, cerebellum, cerebral peduncle is, and thalami appear unremarkable.  Right eccentric similar lesion observed as on prior exams. No intracranial hemorrhage arm other mass lesion identified.  IMPRESSION: 1. Increase conspicuity of small hypodensities in the left lentiform nucleus and right caudate head, suspicious for small subacute lacunar infarcts. 2. Periventricular white matter and corona radiata hypodensities favor chronic ischemic microvascular white matter disease. 3. 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 These results were called by telephone at the time of interpretation on 01/13/2014 at 7:04 pm to Dr. Riki Altes , who verbally  acknowledged these results.   Electronically Signed   By: Sherryl Barters M.D.   On: 01/13/2014 19:08   Mr Brain Ltd W/o Cm  01/14/2014   CLINICAL DATA:  Speech difficulty  EXAM: MRI HEAD WITHOUT CONTRAST  TECHNIQUE: Multiplanar, multiecho pulse sequences of the brain and surrounding structures were obtained without intravenous contrast.  COMPARISON:  Prior CT from earlier the same day as well as previous MRI from 08/02/2013.  FINDINGS: 9 mm focus of restricted diffusion present within the posterior aspect of the putamen, consistent with acute ischemic infarct. There is superior extension into the left corona radiata with probable involvement of the body of the left caudate (series 4, image 19). These correspond with previously seen hypodensities seen on prior CT. No definite associated hemorrhage. No significant mass effect. No other acute ischemic infarct.  Sagittal T1 weighted view of the brain demonstrates no other acute abnormality. Sellar enlargement again noted. Craniocervical junction within normal limits. Probable remote lacunar infarct noted within the pons.  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 No significant mass effect.   Electronically Signed   By: Jeannine Boga M.D.   On: 01/14/2014 00:22    Scheduled Meds: .  stroke: mapping our early stages of recovery book   Does not apply Once  . aspirin  300 mg Rectal Daily   Or  . aspirin  325 mg Oral Daily  . atorvastatin  40 mg Oral q1800  . furosemide  240 mg Oral Daily  . heparin  5,000 Units Subcutaneous 3 times per day  . hydrALAZINE  100 mg Oral 3 times per day  . insulin aspart  0-15 Units Subcutaneous TID WC  . insulin aspart  0-5 Units Subcutaneous QHS  . insulin aspart  6 Units Subcutaneous TID WC  . insulin glargine  30 Units Subcutaneous Daily  . labetalol  200 mg Oral TID   Continuous Infusions:      Time spent: 35  minutes    [facility] A  [facility] Pager [phone] If 7PM-7AM, please contact night-coverage at [email or web address], password [facility] 01/14/2014, 11:14 AM  LOS: 1 day

## 2014-01-14 NOTE — Evaluation (Signed)
Physical Therapy Evaluation Patient Details Name: [place] E[place] MRN: [id number] DOB: 12-11-1950 Today's Date: 01/14/2014   History of Present Illness   63 y.o. male, right handed,admitted due to 2-3 days of expressive deficits. 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  PMH:  HTN, DM type II, CKD, obstructive sleep apnea on CPAP, obesity, and venous stasis ulcer  Clinical Impression  Patient presents with problems listed below.  Patient will benefit from acute PT to maximize functional mobility prior to discharge home with wife.  Patient with decreased balance and mobility.  Recommend OP PT at discharge.    Follow Up Recommendations Outpatient PT;Supervision/Assistance - 24 hour    Equipment Recommendations  None recommended by PT    Recommendations for Other Services       Precautions / Restrictions Precautions Precautions: Fall Restrictions Weight Bearing Restrictions: No      Mobility  Bed Mobility                  Transfers Overall transfer level: Needs assistance Equipment used: Rolling walker (2 wheeled) Transfers: Sit to/from Stand Sit to Stand: Min assist         General transfer comment: Verbal cues for hand placement - patient reaching for RW to pull up.  Assist to move to standing and for balance initially.  Ambulation/Gait Ambulation/Gait assistance: Min assist Ambulation Distance (Feet): 82 Feet Assistive device: Rolling walker (2 wheeled) Gait Pattern/deviations: Step-through pattern;Decreased step length - right;Decreased step length - left;Decreased stride length;Shuffle;Trunk flexed;Wide base of support Gait velocity: Decreased Gait velocity interpretation: Below normal speed for age/gender General Gait Details: Verbal cues for safe use of RW, especially during turns.  Patient stepping outside of RW.  Cues to stand upright.  Decreased balance with gait.  Stairs            Wheelchair  Mobility    Modified Rankin (Stroke Patients Only) Modified Rankin (Stroke Patients Only) Pre-Morbid Rankin Score: Moderate disability Modified Rankin: Moderately severe disability     Balance Overall balance assessment: Needs assistance         Standing balance support: Bilateral upper extremity supported Standing balance-Leahy Scale: Poor                               Pertinent Vitals/Pain     Home Living Family/patient expects to be discharged to:: Private residence Living Arrangements: Spouse/significant other Available Help at Discharge: Family;Available 24 hours/day Type of Home: House Home Access: Level entry     Home Layout: One level Home Equipment: Walker - 2 wheels;Cane - single point;Electric scooter      Prior Function Level of Independence: Independent with assistive device(s);Needs assistance   Gait / Transfers Assistance Needed: Per patient, uses electric wheelchair/scooter in house and when going out of house.  Does ambulate in house with cane or RW most recently.           Hand Dominance   Dominant Hand: Right    Extremity/Trunk Assessment   Upper Extremity Assessment: Defer to OT evaluation           Lower Extremity Assessment: Generalized weakness (Noted edema in BLE's, R>L)         Communication   Communication: Expressive difficulties  Cognition Arousal/Alertness: Awake/alert Behavior During Therapy: WFL for tasks assessed/performed (Tearful at times) Overall Cognitive Status: Within Functional Limits for tasks assessed  General Comments      Exercises        Assessment/Plan    PT Assessment Patient needs continued PT services  PT Diagnosis Difficulty walking;Abnormality of gait;Generalized weakness   PT Problem List Decreased strength;Decreased activity tolerance;Decreased balance;Decreased mobility;Decreased knowledge of use of DME;Obesity  PT Treatment Interventions DME  instruction;Gait training;Functional mobility training;Therapeutic activities;Balance training;Patient/family education   PT Goals (Current goals can be found in the Care Plan section) Acute Rehab PT Goals Patient Stated Goal: To get stronger PT Goal Formulation: With patient Time For Goal Achievement: 01/21/14 Potential to Achieve Goals: Good    Frequency Min 4X/week   Barriers to discharge        Co-evaluation               End of Session Equipment Utilized During Treatment: Gait belt Activity Tolerance: Patient limited by fatigue Patient left: in chair;with call bell/phone within reach;with chair alarm set;with family/visitor present Nurse Communication: Mobility status         Time: 0712-1975 PT Time Calculation (min): 33 min   Charges:   PT Evaluation $Initial PT Evaluation Tier I: 1 Procedure PT Treatments $Gait Training: 23-37 mins   PT G CodesDespina Pole 01/14/2014, 7:05 PM Carita Pian. Sanjuana Kava, [place] Pager [phone]

## 2014-01-14 NOTE — Consult Note (Signed)
Referring Physician: [facility]    Chief Complaint: language impairment, stroke on CT brain  HPI:                                                                                                                                         Blake Bell is an 63 y.o. male, right handed, with a past medical history significant for HTN, DM type II, CKD, obstructive sleep apnea on CPAP, obesity, and venous stasis ulcer, transferred to [facility] for further stroke evaluation.management. Mr. Blake Bell stated that 3 days ago he started noticing difficulty expressing himself, " couldn't get my words out even knowing what I want it to say". He said that this problem got progressively worse and pt's family called PCP who advised them to bring pt to ER for evaluation for "possible mini-stroke Denies associated HA, vertigo, double vision, difficulty swallowing, confusion, imbalance, focal weakness or numbness, vision disturbances, chest pain, SOB, or palpitations. CT brain " increase conspicuity of small hypodensities in the left lentiform nucleus and right caudate head, suspicious for small subacute lacunar infarcts". Started on aspirin during this admission. Date last known well: 01/11/14  Time last known well: uncertain tPA Given: no, out of the window.   Past Medical History  Diagnosis Date  . Hypertension   . Diabetes mellitus without complication   . Sleep apnea     uses cpap, setting of 5  . Chronic kidney disease     ssees dr Florene Glen nephrology every 6-9 months  . Multiple wounds     on lower legs - sees [name] Pain at the [facility]    Past Surgical History  Procedure Laterality Date  . Colonscopy  6-7 yrs ago  . Colonoscopy N/A 04/05/2013    Procedure: COLONOSCOPY;  Surgeon: Juanita Craver, MD;  Location: [facility];  Service: Endoscopy;  Laterality: N/A;    Family History  Problem Relation Age of Onset  . Diabetes Mother   . Diabetes Father   . Diabetes Sister   . Diabetes Brother   .  Hypertension Mother   . Hypertension Father    Social History:  reports that he quit smoking about 43 years ago. His smoking use included Cigarettes. He has a .5 pack-year smoking history. He has never used smokeless tobacco. He reports that he does not drink alcohol or use illicit drugs.  Allergies: No Known Allergies  Medications:  Scheduled: .  stroke: mapping our early stages of recovery book   Does not apply Once  . aspirin  300 mg Rectal Daily   Or  . aspirin  325 mg Oral Daily  . atorvastatin  40 mg Oral q1800  . furosemide  240 mg Oral Daily  . heparin  5,000 Units Subcutaneous 3 times per day  . hydrALAZINE  100 mg Oral 3 times per day  . insulin aspart  0-15 Units Subcutaneous TID WC  . insulin aspart  0-5 Units Subcutaneous QHS  . insulin aspart  6 Units Subcutaneous TID WC  . insulin glargine  30 Units Subcutaneous Daily  . labetalol  200 mg Oral TID    ROS:                                                                                                                                       History obtained from the patient  General ROS: negative for - chills, fatigue, fever, night sweats, or weight loss Psychological ROS: negative for - behavioral disorder, hallucinations, memory difficulties, mood swings or suicidal ideation Ophthalmic ROS: negative for - blurry vision, double vision, eye pain or loss of vision ENT ROS: negative for - epistaxis, nasal discharge, oral lesions, sore throat, tinnitus or vertigo Allergy and Immunology ROS: negative for - hives or itchy/watery eyes Hematological and Lymphatic ROS: negative for - bleeding problems, bruising or swollen lymph nodes Endocrine ROS: negative for - galactorrhea, hair pattern changes, polydipsia/polyuria or temperature intolerance Respiratory ROS: negative for - cough, hemoptysis, shortness of  breath or wheezing Cardiovascular ROS: negative for - chest pain, dyspnea on exertion, edema or irregular heartbeat Gastrointestinal ROS: negative for - abdominal pain, diarrhea, hematemesis, nausea/vomiting or stool incontinence Genito-Urinary ROS: negative for - dysuria, hematuria, incontinence or urinary frequency/urgency Musculoskeletal ROS: negative for - joint swelling or muscular weakness Neurological ROS: as noted in HPI Dermatological ROS: negative for rash  Physical exam: pleasant male in no apparent distress.Blood pressure 154/76, pulse 91, temperature 98.9 F (37.2 C), temperature source Oral, resp. rate 22, height 5' 9.5" (1.765 m), weight 165.019 kg (363 lb 12.8 oz), SpO2 94.00%. Head: normocephalic. Neck: supple, no bruits, no JVD. Cardiac: no murmurs. Lungs: clear. Abdomen: soft, no tender, no mass. Extremities: Bilateral leg wrapped, bilateral Pedal edema, no calf tenderness Neurologic Examination:  General: Mental Status: Alert, oriented, thought content appropriate.  Spontaneous language is non fluent.  Able to follow 3 step commands, at times with some difficulty. Cranial Nerves: II: Discs flat bilaterally; Visual fields grossly normal, pupils equal, round, reactive to light and accommodation III,IV, VI: ptosis not present, extra-ocular motions intact bilaterally V,VII: smile asymmetric due to subtle right face weakness, facial light touch sensation normal bilaterally VIII: hearing normal bilaterally IX,X: gag reflex present XI: bilateral shoulder shrug XII: midline tongue extension without atrophy or fasciculations Motor: Moves all extremities symmetrically Tone and bulk:normal tone throughout; no atrophy noted Sensory: Pinprick and light touch intact throughout, bilaterally Deep Tendon Reflexes:  2 all over Plantars: No tested Cerebellar: normal  finger-to-nose,  heel-to-shin can not be tested Gait:  No tested  Results for orders placed during the hospital encounter of 01/13/14 (from the past 48 hour(s))  CBG MONITORING, ED     Status: Abnormal   Collection Time    01/13/14  5:40 PM      Result Value Ref Range   Glucose-Capillary 260 (*) 70 - 99 mg/dL  CBC     Status: Abnormal   Collection Time    01/13/14  5:57 PM      Result Value Ref Range   WBC 12.0 (*) 4.0 - 10.5 K/uL   RBC 3.94 (*) 4.22 - 5.81 MIL/uL   Hemoglobin 11.5 (*) 13.0 - 17.0 g/dL   HCT 35.2 (*) 39.0 - 52.0 %   MCV 89.3  78.0 - 100.0 fL   MCH 29.2  26.0 - 34.0 pg   MCHC 32.7  30.0 - 36.0 g/dL   RDW 15.8 (*) 11.5 - 15.5 %   Platelets 429 (*) 150 - 400 K/uL  COMPREHENSIVE METABOLIC PANEL     Status: Abnormal   Collection Time    01/13/14  5:57 PM      Result Value Ref Range   Sodium 139  137 - 147 mEq/L   Potassium 2.9 (*) 3.7 - 5.3 mEq/L   Comment: CRITICAL RESULT CALLED TO, READ BACK BY AND VERIFIED WITH:     JEFFERY T RN 1901 01/13/2014 BY BOVELL,T.   Chloride 100  96 - 112 mEq/L   CO2 26  19 - 32 mEq/L   Glucose, Bld 264 (*) 70 - 99 mg/dL   BUN 16  6 - 23 mg/dL   Creatinine, Ser 2.00 (*) 0.50 - 1.35 mg/dL   Calcium 9.0  8.4 - 10.5 mg/dL   Total Protein 7.0  6.0 - 8.3 g/dL   Albumin 2.5 (*) 3.5 - 5.2 g/dL   AST 15  0 - 37 U/L   ALT 10  0 - 53 U/L   Alkaline Phosphatase 88  39 - 117 U/L   Total Bilirubin 0.3  0.3 - 1.2 mg/dL   GFR calc non Af Amer 34 (*) >90 mL/min   GFR calc Af Amer 39 (*) >90 mL/min   Comment: (NOTE)     The eGFR has been calculated using the CKD EPI equation.     This calculation has not been validated in all clinical situations.     eGFR's persistently <90 mL/min signify possible Chronic Kidney     Disease.   Anion gap 13  5 - 15  I-STAT TROPOININ, ED     Status: None   Collection Time    01/13/14  6:05 PM      Result Value Ref Range   Troponin i, poc 0.06  0.00 -  0.08 ng/mL   Comment 3            Comment: Due to the  release kinetics of cTnI,     a negative result within the first hours     of the onset of symptoms does not rule out     myocardial infarction with certainty.     If myocardial infarction is still suspected,     repeat the test at appropriate intervals.  ETHANOL     Status: None   Collection Time    01/13/14  6:56 PM      Result Value Ref Range   Alcohol, Ethyl (B) <11  0 - 11 mg/dL   Comment:            LOWEST DETECTABLE LIMIT FOR     SERUM ALCOHOL IS 11 mg/dL     FOR MEDICAL PURPOSES ONLY  PROTIME-INR     Status: None   Collection Time    01/13/14  6:56 PM      Result Value Ref Range   Prothrombin Time 13.3  11.6 - 15.2 seconds   INR 1.01  0.00 - 1.49  APTT     Status: None   Collection Time    01/13/14  6:56 PM      Result Value Ref Range   aPTT 31  24 - 37 seconds  DIFFERENTIAL     Status: Abnormal   Collection Time    01/13/14  6:56 PM      Result Value Ref Range   Neutrophils Relative % 80 (*) 43 - 77 %   Neutro Abs 9.7 (*) 1.7 - 7.7 K/uL   Lymphocytes Relative 10 (*) 12 - 46 %   Lymphs Abs 1.3  0.7 - 4.0 K/uL   Monocytes Relative 8  3 - 12 %   Monocytes Absolute 1.0  0.1 - 1.0 K/uL   Eosinophils Relative 1  0 - 5 %   Eosinophils Absolute 0.1  0.0 - 0.7 K/uL   Basophils Relative 1  0 - 1 %   Basophils Absolute 0.1  0.0 - 0.1 K/uL  URINALYSIS, ROUTINE W REFLEX MICROSCOPIC     Status: Abnormal   Collection Time    01/13/14  8:36 PM      Result Value Ref Range   Color, Urine YELLOW  YELLOW   APPearance CLEAR  CLEAR   Specific Gravity, Urine 1.027  1.005 - 1.030   pH 7.0  5.0 - 8.0   Glucose, UA 250 (*) NEGATIVE mg/dL   Hgb urine dipstick SMALL (*) NEGATIVE   Bilirubin Urine NEGATIVE  NEGATIVE   Ketones, ur NEGATIVE  NEGATIVE mg/dL   Protein, ur >300 (*) NEGATIVE mg/dL   Urobilinogen, UA 0.2  0.0 - 1.0 mg/dL   Nitrite NEGATIVE  NEGATIVE   Leukocytes, UA NEGATIVE  NEGATIVE  URINE RAPID DRUG SCREEN (HOSP PERFORMED)     Status: None   Collection Time     01/13/14  8:36 PM      Result Value Ref Range   Opiates NONE DETECTED  NONE DETECTED   Cocaine NONE DETECTED  NONE DETECTED   Benzodiazepines NONE DETECTED  NONE DETECTED   Amphetamines NONE DETECTED  NONE DETECTED   Tetrahydrocannabinol NONE DETECTED  NONE DETECTED   Barbiturates NONE DETECTED  NONE DETECTED   Comment:            DRUG SCREEN FOR MEDICAL PURPOSES     ONLY.  IF CONFIRMATION IS NEEDED  FOR ANY PURPOSE, NOTIFY LAB     WITHIN 5 DAYS.                LOWEST DETECTABLE LIMITS     FOR URINE DRUG SCREEN     Drug Class       Cutoff (ng/mL)     Amphetamine      1000     Barbiturate      200     Benzodiazepine   159     Tricyclics       458     Opiates          300     Cocaine          300     THC              50  URINE MICROSCOPIC-ADD ON     Status: Abnormal   Collection Time    01/13/14  8:36 PM      Result Value Ref Range   RBC / HPF 3-6  <3 RBC/hpf   Bacteria, UA FEW (*) RARE   Casts HYALINE CASTS (*) NEGATIVE  CBG MONITORING, ED     Status: Abnormal   Collection Time    01/13/14  9:06 PM      Result Value Ref Range   Glucose-Capillary 192 (*) 70 - 99 mg/dL   Dg Chest 2 View  01/13/2014   CLINICAL DATA:  Stroke, weakness  EXAM: CHEST  2 VIEW  COMPARISON:  Chest radiograph July 01, 2013  FINDINGS: MR cardiomegaly, a with consideration to this low inspiratory portable examination with crowded, central engorged vascular markings. Mediastinal silhouette is nonsuspicious. No pleural effusions. Rounded airspace opacity in right lung base. No pneumothorax.  Interval removal of right PICC. Soft tissue planes and included osseous structures are nonsuspicious.  IMPRESSION: Similar cardiomegaly with central pulmonary vascular congestion. Rounded airspace opacity in right lung base may reflect confluence of vascular shadows, less likely confluent edema or pneumonia.  Interval removal of right PICC.   Electronically Signed   By: Elon Alas   On: 01/13/2014 20:57   Ct  Head Wo Contrast  01/13/2014   CLINICAL DATA:  Slurred speech.  Altered mental status.  EXAM: CT HEAD WITHOUT CONTRAST  TECHNIQUE: Contiguous axial images were obtained from the base of the skull through the vertex without intravenous contrast.  COMPARISON:  Multiple exams, including 08/02/2013  FINDINGS: Small hypodensity in the head of the right caudate nucleus, image 16 of series 2, proximally 3 mm in diameter, not readily seen on prior exams, possibly a small lacunar infarct. Several hypodensities in the left lentiform nucleus also demonstrate increase conspicuity and could represent subacute lacunar infarcts.  Periventricular white matter and corona radiata hypodensities favor chronic ischemic microvascular white matter disease. The brainstem, cerebellum, cerebral peduncle is, and thalami appear unremarkable.  Right eccentric similar lesion observed as on prior exams. No intracranial hemorrhage arm other mass lesion identified.  IMPRESSION: 1. Increase conspicuity of small hypodensities in the left lentiform nucleus and right caudate head, suspicious for small subacute lacunar infarcts. 2. Periventricular white matter and corona radiata hypodensities favor chronic ischemic microvascular white matter disease. 3. 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 These results were called by telephone at the time of interpretation on 01/13/2014 at 7:04 pm to Dr. Riki Altes , who verbally acknowledged these results.   Electronically Signed   By: Sherryl Barters M.D.   On: 01/13/2014  19:08   Mr [employer] W/o Cm  01/14/2014   CLINICAL DATA:  Speech difficulty  EXAM: MRI HEAD WITHOUT CONTRAST  TECHNIQUE: Multiplanar, multiecho pulse sequences of the brain and surrounding structures were obtained without intravenous contrast.  COMPARISON:  Prior CT from earlier the same day as well as previous MRI from 08/02/2013.  FINDINGS: 9 mm focus of restricted diffusion present within the posterior aspect  of the putamen, consistent with acute ischemic infarct. There is superior extension into the left corona radiata with probable involvement of the body of the left caudate (series 4, image 19). These correspond with previously seen hypodensities seen on prior CT. No definite associated hemorrhage. No significant mass effect. No other acute ischemic infarct.  Sagittal T1 weighted view of the brain demonstrates no other acute abnormality. Sellar enlargement again noted. Craniocervical junction within normal limits. Probable remote lacunar infarct noted within the pons.  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 No significant mass effect.   Electronically Signed   By: Jeannine Boga M.D.   On: 01/14/2014 00:22    Assessment: 63 y.o. male with several risk factors for stroke, admitted due to worsening language impairment x 3 days and CT head showing  small hypodensities in the left lentiform nucleus and right caudate head, suspicious for small subacute lacunar infarcts. His spontaneous language is non fluent and thus suspect that this could be a likely left cortical infarct involving distal left MCA branches. Stroke work up underway. Continue aspirin. Stroke team will resume care in am.  Plan: 1. HgbA1c, fasting lipid panel 2. MRI, MRA  of the brain without contrast 3. Echocardiogram 4. Carotid dopplers 5. Prophylactic therapy-aspirin 6. Risk factor modification 7. Telemetry monitoring 8. Frequent neuro checks 9. PT/OT SLP  Dorian Pod, MD [facility] [phone]  01/14/2014, 12:50 AM

## 2014-01-14 NOTE — Evaluation (Signed)
Occupational Therapy Evaluation Patient Details Name: Blake Bell MRN: [id number] DOB: September 03, 1950 Today's Date: 01/14/2014    History of Present Illness  63 y.o. male, right handed,admitted due to 2-3 days of expressive deficits. 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  PMH:  HTN, DM type II, CKD, obstructive sleep apnea on CPAP, obesity, and venous stasis ulcer   Clinical Impression   Patient evaluated by Occupational Therapy with no further acute OT needs identified. All education has been completed and the patient has no further questions. See below for any follow-up Occupational Therapy or equipment needs. OT to sign off. Thank you for referral.      Follow Up Recommendations  No OT follow up    Equipment Recommendations  None recommended by OT    Recommendations for Other Services       Precautions / Restrictions Precautions Precautions: None      Mobility Bed Mobility Overal bed mobility: Needs Assistance Bed Mobility: Supine to Sit     Supine to sit: Supervision;HOB elevated     General bed mobility comments: pt required use of bed rail and able to exit bed on Lt side without (A)  Transfers Overall transfer level: Needs assistance Equipment used: Rolling walker (2 wheeled) Transfers: Sit to/from Stand Sit to Stand: Min guard         General transfer comment: Pt ambulated to bathroom min guard (A)     Balance                                            ADL Overall ADL's : Needs assistance/impaired;At baseline     Grooming: Oral care;Supervision/safety;Standing Grooming Details (indicate cue type and reason): leaning heavily on counter surface             Lower Body Dressing: Moderate assistance;Sit to/from stand Lower Body Dressing Details (indicate cue type and reason): pt doff bil socks. pt's wife was very upset that pt was attempting task. pt initiated task without therapist  request. pt was only presented with socks. Wife voices socks are not on correctly. Pt with bil feet in socks correctly with grip of socks on the sole of the socks             Functional mobility during ADLs: Min guard;Rolling walker General ADL Comments: Upon entering room wife states "he is asleep you need to come back." pt awake and maknig eye contact with therapist. Pt engaged in arrival of thearpist so OT introducing self and tech to patient and describing reason for arrival. pt offered (A) to complete adl task. pt agreeable and requesting to brush teeth. Tech leaving room to gather items. pt was able to verbalize visitor in the room as wife. upon tech arrival back to room tech again introcuded to wife and names exchanged. Pt initiated sock change and don new socks without cue required. Pt's wife upset with pt attempting task and states "i do it for him" Ot addressing wife's concerns and assured wife that if he needs (A) we will help . Pt ambulated to sink level for oral care. Pt returned to sitting in chair upright upon pt's request. Pt and wife without further questions or concerns      Vision  Perception     Praxis      Pertinent Vitals/Pain VSS     Hand Dominance Right   Extremity/Trunk Assessment Upper Extremity Assessment Upper Extremity Assessment: Overall WFL for tasks assessed   Lower Extremity Assessment Lower Extremity Assessment: Defer to PT evaluation   Cervical / Trunk Assessment Cervical / Trunk Assessment: Normal   Communication Communication Communication: Expressive difficulties   Cognition Arousal/Alertness: Awake/alert Behavior During Therapy: WFL for tasks assessed/performed Overall Cognitive Status: Within Functional Limits for tasks assessed                     General Comments       Exercises       Shoulder Instructions      Home Living Family/patient expects to be discharged to:: Private  residence Living Arrangements: Spouse/significant other Available Help at Discharge: Family;Available 24 hours/day Type of Home: House       Home Layout: One level     Bathroom Shower/Tub:  (sponge bath due to bil LE wounds)   Bathroom Toilet: Standard     Home Equipment: Cane - single point   Additional Comments: wife (A) pt with all bathing and dressing pTA> pt is at home during the day for short periods alone  Lives With: Spouse    Prior Functioning/Environment Level of Independence: Independent with assistive device(s);Needs assistance    ADL's / Homemaking Assistance Needed: per wife pt requires (A) for bathing and dressing at baseline. pt requires total (A) To don bil ted hose and lower leg wound care management        OT Diagnosis:     OT Problem List:     OT Treatment/Interventions:      OT Goals(Current goals can be found in the care plan section)    OT Frequency:     Barriers to D/C:            Co-evaluation              End of Session Equipment Utilized During Treatment: Gait belt;Rolling walker Nurse Communication: Mobility status;Precautions  Activity Tolerance: Patient tolerated treatment well Patient left: in chair;with call bell/phone within reach;with family/visitor present   Time: 6237-6283 OT Time Calculation (min): 20 min Charges:  OT General Charges $OT Visit: 1 Procedure OT Evaluation $Initial OT Evaluation Tier I: 1 Procedure OT Treatments $Self Care/Home Management : 8-22 mins G-Codes:    Peri Maris Jan 15, 2014, 2:38 PM Pager: [phone]

## 2014-01-15 DIAGNOSIS — N179 Acute kidney failure, unspecified: Secondary | ICD-10-CM

## 2014-01-15 LAB — GLUCOSE, CAPILLARY
GLUCOSE-CAPILLARY: 96 mg/dL (ref 70–99)
Glucose-Capillary: 210 mg/dL — ABNORMAL HIGH (ref 70–99)
Glucose-Capillary: 261 mg/dL — ABNORMAL HIGH (ref 70–99)
Glucose-Capillary: 132 mg/dL — ABNORMAL HIGH (ref 70–99)
Glucose-Capillary: 35 mg/dL — CL (ref 70–99)
Glucose-Capillary: 38 mg/dL — CL (ref 70–99)
Glucose-Capillary: 104 mg/dL — ABNORMAL HIGH (ref 70–99)
Glucose-Capillary: 216 mg/dL — ABNORMAL HIGH (ref 70–99)
Glucose-Capillary: 207 mg/dL — ABNORMAL HIGH (ref 70–99)

## 2014-01-15 LAB — BASIC METABOLIC PANEL
Anion gap: 10 (ref 5–15)
Anion gap: 16 — ABNORMAL HIGH (ref 5–15)
BUN: 21 mg/dL (ref 6–23)
BUN: 25 mg/dL — AB (ref 6–23)
CALCIUM: 8.8 mg/dL (ref 8.4–10.5)
CHLORIDE: 97 meq/L (ref 96–112)
CO2: 28 mEq/L (ref 19–32)
CO2: 27 mEq/L (ref 19–32)
Calcium: 9 mg/dL (ref 8.4–10.5)
Chloride: 101 mEq/L (ref 96–112)
Creatinine, Ser: 2.15 mg/dL — ABNORMAL HIGH (ref 0.50–1.35)
Creatinine, Ser: 2.32 mg/dL — ABNORMAL HIGH (ref 0.50–1.35)
GFR calc Af Amer: 36 mL/min — ABNORMAL LOW (ref >90)
GFR calc non Af Amer: 28 mL/min — ABNORMAL LOW (ref >90)
GFR, EST AFRICAN AMERICAN: 33 mL/min — AB (ref >90)
GFR, EST NON AFRICAN AMERICAN: 31 mL/min — AB (ref >90)
Glucose, Bld: 180 mg/dL — ABNORMAL HIGH (ref 70–99)
Glucose, Bld: 91 mg/dL (ref 70–99)
POTASSIUM: 3.2 meq/L — AB (ref 3.7–5.3)
Potassium: 3.1 mEq/L — ABNORMAL LOW (ref 3.7–5.3)
Sodium: 139 mEq/L (ref 137–147)
Sodium: 140 mEq/L (ref 137–147)

## 2014-01-15 LAB — MAGNESIUM: Magnesium: 1.5 mg/dL (ref 1.5–2.5)

## 2014-01-15 MED ORDER — INSULIN GLARGINE 100 UNIT/ML ~~LOC~~ SOLN
25.0000 [IU] | Freq: Every day | SUBCUTANEOUS | Status: DC
  Administered 2014-01-16 – 2014-01-18 (×2): 25 [IU] via SUBCUTANEOUS
  Filled 2014-01-15 (×4): qty 0.25

## 2014-01-15 MED ORDER — PNEUMOCOCCAL VAC POLYVALENT 25 MCG/0.5ML IJ INJ
0.5000 mL | INJECTION | INTRAMUSCULAR | Status: AC
  Administered 2014-01-16: 0.5 mL via INTRAMUSCULAR
  Filled 2014-01-15: qty 0.5

## 2014-01-15 MED ORDER — POTASSIUM CHLORIDE CRYS ER 20 MEQ PO TBCR
60.0000 meq | EXTENDED_RELEASE_TABLET | Freq: Four times a day (QID) | ORAL | Status: AC
  Administered 2014-01-15: 60 meq via ORAL
  Filled 2014-01-15: qty 3

## 2014-01-15 MED ORDER — DEXTROSE 50 % IV SOLN
INTRAVENOUS | Status: AC
  Administered 2014-01-15: 50 mL
  Filled 2014-01-15: qty 50

## 2014-01-15 MED ORDER — MAGNESIUM SULFATE 40 MG/ML IJ SOLN
2.0000 g | Freq: Once | INTRAMUSCULAR | Status: AC
  Administered 2014-01-15: 2 g via INTRAVENOUS
  Filled 2014-01-15: qty 50

## 2014-01-15 NOTE — Progress Notes (Signed)
[facility] PROGRESS NOTE   Blake Bell:[id number] DOB: 1950/07/27 DOA: 01/13/2014 PCP: [name], MD  HPI/Subjective: Seen with wife at bedside. Feels much better, wife was very upset that he was placed on MRSA isolation  Assessment/Plan: Principal Problem:   CVA (cerebral infarction) Active Problems:   OSA (obstructive sleep apnea)   CKD (chronic kidney disease) stage 3, GFR 30-59 ml/min   Morbid obesity   Venous stasis ulcers   DM (diabetes mellitus), type 2    CVA (cerebral infarction)  The patient is presenting with complaints of expressive aphasia.  His CT scan is positive for subacute left lentiform and right caudate nucleus infarct.  He has diabetes, dyslipidemia, morbid obesity, sleep apnea, hypertension as risk factors.  PTOT and speech consultation.  Per neurology recommendation, full dose of aspirin and statins. No vascular imaging yet, reordered carotid duplex/transcranial Dopplers.  Diabetes mellitus Uncontrolled diabetes mellitus with hemoglobin A1c of 9.9 which correlate with mean plasma glucose of 273.  At present aspiration precaution and diabetic diet.  Continue with home insulin and sliding scale.   Obstructive sleep apnea Last sleep study 2004 may require repeat sleep study as she is only using 5 cm of pressure by prior to hydration was 15 cm.   Hypertension Continue home medications   Chronic kidney disease  Creatinine is about to baseline (1.7-2.0). Continue Lasix, elevate lower extremities.  Hypokalemia Repleted orally, check BMP.  Venous stasis ulcer Diastolic dysfunction.  Continue Lasix at home dose.  Continue with the catheter   Code Status: Full code Family Communication: Plan discussed with the patient. Disposition Plan: Remains inpatient   Consultants:  Neurology  Procedures:  None  Antibiotics:  None   Objective: Filed Vitals:   01/15/14 0927  BP: 170/96  Pulse: 85  Temp: 97.4 F  (36.3 C)  Resp: 21    Intake/Output Summary (Last 24 hours) at 01/15/14 1028 Last data filed at 01/15/14 0900  Gross per 24 hour  Intake   1020 ml  Output   1200 ml  Net   -180 ml   Filed Weights   01/13/14 2229  Weight: 165.019 kg (363 lb 12.8 oz)    Exam: General: Alert and awake, oriented x3, not in any acute distress. HEENT: anicteric sclera, pupils reactive to light and accommodation, EOMI CVS: S1-S2 clear, no murmur rubs or gallops Chest: clear to auscultation bilaterally, no wheezing, rales or rhonchi Abdomen: soft nontender, nondistended, normal bowel sounds, no organomegaly Extremities: no cyanosis, clubbing or edema noted bilaterally Neuro: Cranial nerves II-XII intact, no focal neurological deficits  Data Reviewed: Basic Metabolic Panel:  Recent Labs Lab 01/13/14 1757 01/14/14 1200 01/15/14 0500  NA 139 142 139  K 2.9* 3.4* 3.1*  CL 100 103 101  CO2 26 28 28   GLUCOSE 264* 142* 180*  BUN 16 19 21   CREATININE 2.00* 2.09* 2.15*  CALCIUM 9.0 8.8 8.8  MG  --   --  1.5   Liver Function Tests:  Recent Labs Lab 01/13/14 1757  AST 15  ALT 10  ALKPHOS 88  BILITOT 0.3  PROT 7.0  ALBUMIN 2.5*   No results found for this basename: LIPASE, AMYLASE,  in the last 168 hours No results found for this basename: AMMONIA,  in the last 168 hours CBC:  Recent Labs Lab 01/13/14 1757 01/13/14 1856  WBC 12.0*  --   NEUTROABS  --  9.7*  HGB 11.5*  --   HCT 35.2*  --   MCV 89.3  --  PLT 429*  --    Cardiac Enzymes: No results found for this basename: CKTOTAL, CKMB, CKMBINDEX, TROPONINI,  in the last 168 hours BNP (last 3 results)  Recent Labs  06/29/13 0330  PROBNP 1004.0*   CBG:  Recent Labs Lab 01/14/14 0653 01/14/14 1126 01/14/14 1631 01/14/14 2114 01/15/14 0654  GLUCAP 197* 161* 113* 154* 210*    Micro Recent Results (from the past 240 hour(s))  MRSA PCR SCREENING     Status: None   Collection Time    01/14/14 11:18 AM      Result  Value Ref Range Status   MRSA by PCR NEGATIVE  NEGATIVE Final   Comment:            The GeneXpert MRSA Assay (FDA     approved for NASAL specimens     only), is one component of a     comprehensive MRSA colonization     surveillance program. It is not     intended to diagnose MRSA     infection nor to guide or     monitor treatment for     MRSA infections.     Studies: Dg Chest 2 View  01/13/2014   CLINICAL DATA:  Stroke, weakness  EXAM: CHEST  2 VIEW  COMPARISON:  Chest radiograph July 01, 2013  FINDINGS: MR cardiomegaly, a with consideration to this low inspiratory portable examination with crowded, central engorged vascular markings. Mediastinal silhouette is nonsuspicious. No pleural effusions. Rounded airspace opacity in right lung base. No pneumothorax.  Interval removal of right PICC. Soft tissue planes and included osseous structures are nonsuspicious.  IMPRESSION: Similar cardiomegaly with central pulmonary vascular congestion. Rounded airspace opacity in right lung base may reflect confluence of vascular shadows, less likely confluent edema or pneumonia.  Interval removal of right PICC.   Electronically Signed   By: Elon Alas   On: 01/13/2014 20:57   Ct Head Wo Contrast  01/13/2014   CLINICAL DATA:  Slurred speech.  Altered mental status.  EXAM: CT HEAD WITHOUT CONTRAST  TECHNIQUE: Contiguous axial images were obtained from the base of the skull through the vertex without intravenous contrast.  COMPARISON:  Multiple exams, including 08/02/2013  FINDINGS: Small hypodensity in the head of the right caudate nucleus, image 16 of series 2, proximally 3 mm in diameter, not readily seen on prior exams, possibly a small lacunar infarct. Several hypodensities in the left lentiform nucleus also demonstrate increase conspicuity and could represent subacute lacunar infarcts.  Periventricular white matter and corona radiata hypodensities favor chronic ischemic microvascular white matter  disease. The brainstem, cerebellum, cerebral peduncle is, and thalami appear unremarkable.  Right eccentric similar lesion observed as on prior exams. No intracranial hemorrhage arm other mass lesion identified.  IMPRESSION: 1. Increase conspicuity of small hypodensities in the left lentiform nucleus and right caudate head, suspicious for small subacute lacunar infarcts. 2. Periventricular white matter and corona radiata hypodensities favor chronic ischemic microvascular white matter disease. 3. 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 These results were called by telephone at the time of interpretation on 01/13/2014 at 7:04 pm to Dr. Riki Altes , who verbally acknowledged these results.   Electronically Signed   By: Sherryl Barters M.D.   On: 01/13/2014 19:08   Mr Brain Ltd W/o Cm  01/14/2014   CLINICAL DATA:  Speech difficulty  EXAM: MRI HEAD WITHOUT CONTRAST  TECHNIQUE: Multiplanar, multiecho pulse sequences of the brain and surrounding structures were  obtained without intravenous contrast.  COMPARISON:  Prior CT from earlier the same day as well as previous MRI from 08/02/2013.  FINDINGS: 9 mm focus of restricted diffusion present within the posterior aspect of the putamen, consistent with acute ischemic infarct. There is superior extension into the left corona radiata with probable involvement of the body of the left caudate (series 4, image 19). These correspond with previously seen hypodensities seen on prior CT. No definite associated hemorrhage. No significant mass effect. No other acute ischemic infarct.  Sagittal T1 weighted view of the brain demonstrates no other acute abnormality. Sellar enlargement again noted. Craniocervical junction within normal limits. Probable remote lacunar infarct noted within the pons.  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 No significant mass effect.   Electronically  Signed   By: Jeannine Boga M.D.   On: 01/14/2014 00:22    Scheduled Meds: .  stroke: mapping our early stages of recovery book   Does not apply Once  . aspirin  300 mg Rectal Daily   Or  . aspirin  325 mg Oral Daily  . atorvastatin  40 mg Oral q1800  . furosemide  240 mg Oral Daily  . heparin  5,000 Units Subcutaneous 3 times per day  . hydrALAZINE  100 mg Oral 3 times per day  . insulin aspart  0-15 Units Subcutaneous TID WC  . insulin aspart  0-5 Units Subcutaneous QHS  . insulin aspart  6 Units Subcutaneous TID WC  . insulin glargine  30 Units Subcutaneous Daily  . labetalol  200 mg Oral TID  . magnesium sulfate 1 - 4 g bolus IVPB  2 g Intravenous Once  . [START ON 01/16/2014] pneumococcal 23 valent vaccine  0.5 mL Intramuscular Tomorrow-1000  . potassium chloride  60 mEq Oral Q6H   Continuous Infusions:      Time spent: 35 minutes    [employer] A  [facility] Pager [phone] If 7PM-7AM, please contact night-coverage at [email or web address], password [facility] 01/15/2014, 10:28 AM  LOS: 2 days

## 2014-01-15 NOTE — Progress Notes (Signed)
RT Note: Pt states he can place himself on CPAP when ready. I told him to call for further assistance.

## 2014-01-15 NOTE — Progress Notes (Signed)
Stroke Team Progress Note  HISTORY Blake Bell is a 63 y.o. male, right handed, with a past medical history significant for HTN, DM type II, CKD, obstructive sleep apnea on CPAP, obesity, and venous stasis ulcer, transferred to [facility] for further stroke evaluation.management.  Blake Bell stated that 3 days prior to admission he started noticing difficulty expressing himself, " couldn't get my words out even knowing what I wanted to say". He said that this problem got progressively worse and pt's family called PCP who advised them to bring pt to ER for evaluation for "possible mini-stroke  Pt denied associated HA, vertigo, double vision, difficulty swallowing, confusion, imbalance, focal weakness or numbness, vision disturbances, chest pain, SOB, or palpitations.  CT brain " increase conspicuity of small hypodensities in the left lentiform nucleus and right caudate head, suspicious for small subacute lacunar infarcts".  Started on aspirin during this admission.   Date last known well: 01/11/14  Time last known well: uncertain  tPA Given: no, out of the window.    SUBJECTIVE Multiple family members are present today including the patient's wife. The patient's speech appears to be improving. Discussed risk factor control in order to prevent future strokes.  OBJECTIVE Most recent Vital Signs: Filed Vitals:   01/14/14 2137 01/15/14 0146 01/15/14 0513 01/15/14 0927  BP: 176/92 180/96 171/94 170/96  Pulse: 74 82 84 85  Temp: 98.5 F (36.9 C) 98.3 F (36.8 C) 98 F (36.7 C) 97.4 F (36.3 C)  TempSrc: Axillary Axillary Axillary Oral  Resp: 22 22 22 21   Height:      Weight:      SpO2: 97% 97% 97% 98%   CBG (last 3)   Recent Labs  01/14/14 1631 01/14/14 2114 01/15/14 0654  GLUCAP 113* 154* 210*    IV Fluid Intake:     MEDICATIONS  .  stroke: mapping our early stages of recovery book   Does not apply Once  . aspirin  300 mg Rectal Daily   Or  . aspirin  325 mg Oral Daily  .  atorvastatin  40 mg Oral q1800  . furosemide  240 mg Oral Daily  . heparin  5,000 Units Subcutaneous 3 times per day  . hydrALAZINE  100 mg Oral 3 times per day  . insulin aspart  0-15 Units Subcutaneous TID WC  . insulin aspart  0-5 Units Subcutaneous QHS  . insulin aspart  6 Units Subcutaneous TID WC  . insulin glargine  30 Units Subcutaneous Daily  . labetalol  200 mg Oral TID  . [START ON 01/16/2014] pneumococcal 23 valent vaccine  0.5 mL Intramuscular Tomorrow-1000   PRN:  senna-docusate  Diet:    heart healthy carb modified diet with thin liquids Activity: Up with assistance DVT Prophylaxis:  Subcutaneous heparin  CLINICALLY SIGNIFICANT STUDIES Basic Metabolic Panel:   Recent Labs Lab 01/14/14 1200 01/15/14 0500  NA 142 139  K 3.4* 3.1*  CL 103 101  CO2 28 28  GLUCOSE 142* 180*  BUN 19 21  CREATININE 2.09* 2.15*  CALCIUM 8.8 8.8  MG  --  1.5   Liver Function Tests:   Recent Labs Lab 01/13/14 1757  AST 15  ALT 10  ALKPHOS 88  BILITOT 0.3  PROT 7.0  ALBUMIN 2.5*   CBC:   Recent Labs Lab 01/13/14 1757 01/13/14 1856  WBC 12.0*  --   NEUTROABS  --  9.7*  HGB 11.5*  --   HCT 35.2*  --   MCV 89.3  --  PLT 429*  --    Coagulation:   Recent Labs Lab 01/13/14 1856  LABPROT 13.3  INR 1.01   Cardiac Enzymes: No results found for this basename: CKTOTAL, CKMB, CKMBINDEX, TROPONINI,  in the last 168 hours Urinalysis:   Recent Labs Lab 01/13/14 2036  COLORURINE YELLOW  LABSPEC 1.027  PHURINE 7.0  GLUCOSEU 250*  HGBUR SMALL*  BILIRUBINUR NEGATIVE  KETONESUR NEGATIVE  PROTEINUR >300*  UROBILINOGEN 0.2  NITRITE NEGATIVE  LEUKOCYTESUR NEGATIVE   Lipid Panel    Component Value Date/Time   CHOL 238* 01/14/2014 0548   TRIG 143 01/14/2014 0548   HDL 64 01/14/2014 0548   CHOLHDL 3.7 01/14/2014 0548   VLDL 29 01/14/2014 0548   LDLCALC 145* 01/14/2014 0548   HgbA1C  Lab Results  Component Value Date   HGBA1C 9.9* 01/14/2014    Urine Drug Screen:      Component Value Date/Time   LABOPIA NONE DETECTED 01/13/2014 2036   COCAINSCRNUR NONE DETECTED 01/13/2014 2036   LABBENZ NONE DETECTED 01/13/2014 2036   AMPHETMU NONE DETECTED 01/13/2014 2036   THCU NONE DETECTED 01/13/2014 2036   LABBARB NONE DETECTED 01/13/2014 2036    Alcohol Level:   Recent Labs Lab 01/13/14 1856  ETH <11    Dg Chest 2 View 01/13/2014    Similar cardiomegaly with central pulmonary vascular congestion. Rounded airspace opacity in right lung base may reflect confluence of vascular shadows, less likely confluent edema or pneumonia.  Interval removal of right PICC.       Ct Head Wo Contrast 01/13/2014    1. Increase conspicuity of small hypodensities in the left lentiform nucleus and right caudate head, suspicious for small subacute lacunar infarcts.  2. Periventricular white matter and corona radiata hypodensities favor chronic ischemic microvascular white matter disease.  3. 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    Mr Brain Ltd W/o Cm 01/14/2014    Acute ischemic infarct involving the right putamen with superior extension into the right corona radiata. No significant mass effect. Probable remote lacunar infarct noted within the pons.    Carotid Doppler - 1-39% ICA stenosis. Vertebral artery flow not insonated secondary to body habitus.   2D Echocardiogram  ejection fraction of 40-45%. No cardiac source of emboli identified.   EKG - sinus rhythm rate 92 beats per minute. For complete results please see formal report.   Therapy Recommendations - outpatient physical therapy and speech therapy recommended.  Physical Exam   Morbidly obese African American male.Awake alert. Afebrile. Head is nontraumatic. Neck is supple without bruit. Hearing is normal. Cardiac exam no murmur or gallop. Lungs are clear to auscultation. Distal pulses are well felt. NEUROLOGIC:   MENTAL STATUS: awake, alert, oriented,  follows simple commands, mild  expressive aphasia. With fluctuating word finding difficulties. Able to speak short sentences. Good naming, comprehension and repetition the CRANIAL NERVES: pupils equal and reactive to light,extraocular muscles intact, fundi were not visualized. Vision acuity and fields appear normal. facial sensation normal, uvula midline, tongue midline. Mild right facial droop. MOTOR: normal bulk and tone, Strength - 5/5 mild difficulties with fine motor movements of the right hand. Orbits left or right upper extremity. Deep tendon reflexes are symmetric. SENSORY: normal and symmetric to light touch  COORDINATION: finger-nose-finger normal - heel to shin normal  REFLEXES: deep tendon reflexes present and symmetric - no babinski    ASSESSMENT Blake Bell is a 63 y.o. male presenting with speech difficulties. TPA therapy  was not initiated secondary to late presentation.MRI -acute ischemic infarct involving the right putamen with superior extension into the right corona radiata. Infarct felt to be thrombotic secondary to small vessel disease. Probable remote lacunar infarct noted within the pons. On no antithrombotics prior to admission. Now on aspirin 325 mg orally every day for secondary stroke prevention. Patient with resultant aphasia. Stroke work up underway.   Chronic kidney disease - Creatinine 2.0  Obesity  Obstructive sleep apnea  Diabetes mellitus - hemoglobin A1c 9.9  Hyperlipidemia - cholesterol 238; LDL 145 - Lipitor added  Hypertension  Hypokalemia   Hospital day # 2  TREATMENT/PLAN  Continue aspirin 325 mg orally every day for secondary stroke prevention.  Therapists recommended outpatient physical and speech therapies.  Needs better diabetic control  Plan TCD's instead of MRA. (unable to do CTA secondary to chronic kidney disease )  Encourage weight loss  Monitor potassium  Mikey Bussing PA-C [facility] Pager [phone] 01/15/2014, 9:35  AM  I have personally examined this patient, reviewed notes, independently viewed imaging studies, participated in medical decision making and plan of care. I have made any additions or clarifications directly to the above note. Agree with note above. I had a long discussion with the patient regarding his multiple stroke risk factors and need for aggressive risk factor modification and lifestyle changes.  Leotis Pain  SIGNED    To contact Stroke Continuity provider, please refer to [email or web address] After hours, contact General Neurology

## 2014-01-15 NOTE — Progress Notes (Signed)
VASCULAR LAB PRELIMINARY  PRELIMINARY  PRELIMINARY  PRELIMINARY  Carotid Dopplers completed.    Preliminary report:  1-39% ICA stenosis.  Vertebral artery flow not insonated secondary to body habitus.  Dunia Pringle, RVT 01/15/2014, 10:58 AM

## 2014-01-16 LAB — GLUCOSE, CAPILLARY
GLUCOSE-CAPILLARY: 157 mg/dL — AB (ref 70–99)
GLUCOSE-CAPILLARY: 191 mg/dL — AB (ref 70–99)
Glucose-Capillary: 191 mg/dL — ABNORMAL HIGH (ref 70–99)
Glucose-Capillary: 144 mg/dL — ABNORMAL HIGH (ref 70–99)

## 2014-01-16 LAB — BASIC METABOLIC PANEL
ANION GAP: 15 (ref 5–15)
BUN: 26 mg/dL — ABNORMAL HIGH (ref 6–23)
CHLORIDE: 99 meq/L (ref 96–112)
CO2: 24 mEq/L (ref 19–32)
Calcium: 8.6 mg/dL (ref 8.4–10.5)
Creatinine, Ser: 2.14 mg/dL — ABNORMAL HIGH (ref 0.50–1.35)
GFR calc non Af Amer: 31 mL/min — ABNORMAL LOW (ref >90)
GFR, EST AFRICAN AMERICAN: 36 mL/min — AB (ref >90)
Glucose, Bld: 160 mg/dL — ABNORMAL HIGH (ref 70–99)
POTASSIUM: 4.3 meq/L (ref 3.7–5.3)
Sodium: 138 mEq/L (ref 137–147)

## 2014-01-16 LAB — MAGNESIUM: MAGNESIUM: 1.9 mg/dL (ref 1.5–2.5)

## 2014-01-16 NOTE — Progress Notes (Signed)
TCD completed. 

## 2014-01-16 NOTE — Progress Notes (Signed)
Stroke Team Progress Note  HISTORY Vernie Piet is a 63 y.o. male, right handed, with a past medical history significant for HTN, DM type II, CKD, obstructive sleep apnea on CPAP, obesity, and venous stasis ulcer, transferred to [facility] for further stroke evaluation.management.  [name] stated that 3 days prior to admission he started noticing difficulty expressing himself, " couldn't get my words out even knowing what I wanted to say". He said that this problem got progressively worse and pt's family called PCP who advised them to bring pt to ER for evaluation for "possible mini-stroke  Pt denied associated HA, vertigo, double vision, difficulty swallowing, confusion, imbalance, focal weakness or numbness, vision disturbances, chest pain, SOB, or palpitations.  CT brain " increase conspicuity of small hypodensities in the left lentiform nucleus and right caudate head, suspicious for small subacute lacunar infarcts".  Started on aspirin during this admission.   Date last known well: 01/11/14  Time last known well: uncertain  tPA Given: no, out of the window.    SUBJECTIVE The patient's wife is present. The patient feels that his speech continues to improve. TCDs completed per pt. Results pending.  OBJECTIVE Most recent Vital Signs: Filed Vitals:   01/15/14 1852 01/15/14 2141 01/16/14 0119 01/16/14 0514  BP: 174/92 188/104 185/109 153/71  Pulse: 84 78 86 77  Temp: 97.9 F (36.6 C) 99 F (37.2 C) 98.4 F (36.9 C) 98.1 F (36.7 C)  TempSrc: Oral Oral Oral Oral  Resp: 20 20 20 20   Height:      Weight:      SpO2: 100% 98% 97% 98%   CBG (last 3)   Recent Labs  01/15/14 1847 01/15/14 2120 01/16/14 0640  GLUCAP 216* 207* 157*    IV Fluid Intake:     MEDICATIONS  .  stroke: mapping our early stages of recovery book   Does not apply Once  . aspirin  300 mg Rectal Daily   Or  . aspirin  325 mg Oral Daily  . atorvastatin  40 mg Oral q1800  . furosemide  240 mg Oral Daily   . heparin  5,000 Units Subcutaneous 3 times per day  . hydrALAZINE  100 mg Oral 3 times per day  . insulin glargine  25 Units Subcutaneous Daily  . labetalol  200 mg Oral TID  . pneumococcal 23 valent vaccine  0.5 mL Intramuscular Tomorrow-1000   PRN:  senna-docusate  Diet:    heart healthy carb modified diet with thin liquids Activity: Up with assistance DVT Prophylaxis:  Subcutaneous heparin  CLINICALLY SIGNIFICANT STUDIES Basic Metabolic Panel:   Recent Labs Lab 01/15/14 0500 01/15/14 1700 01/16/14 0325  NA 139 140 138  K 3.1* 3.2* 4.3  CL 101 97 99  CO2 28 27 24   GLUCOSE 180* 91 160*  BUN 21 25* 26*  CREATININE 2.15* 2.32* 2.14*  CALCIUM 8.8 9.0 8.6  MG 1.5  --  1.9   Liver Function Tests:   Recent Labs Lab 01/13/14 1757  AST 15  ALT 10  ALKPHOS 88  BILITOT 0.3  PROT 7.0  ALBUMIN 2.5*   CBC:   Recent Labs Lab 01/13/14 1757 01/13/14 1856  WBC 12.0*  --   NEUTROABS  --  9.7*  HGB 11.5*  --   HCT 35.2*  --   MCV 89.3  --   PLT 429*  --    Coagulation:   Recent Labs Lab 01/13/14 1856  LABPROT 13.3  INR 1.01   Cardiac  Enzymes: No results found for this basename: CKTOTAL, CKMB, CKMBINDEX, TROPONINI,  in the last 168 hours Urinalysis:   Recent Labs Lab 01/13/14 2036  COLORURINE YELLOW  LABSPEC 1.027  PHURINE 7.0  GLUCOSEU 250*  [place] >300*  UROBILINOGEN 0.2  NITRITE NEGATIVE  LEUKOCYTESUR NEGATIVE   Lipid Panel    Component Value Date/Time   CHOL 238* 01/14/2014 0548   TRIG 143 01/14/2014 0548   HDL 64 01/14/2014 0548   CHOLHDL 3.7 01/14/2014 0548   VLDL 29 01/14/2014 0548   LDLCALC 145* 01/14/2014 0548   HgbA1C  Lab Results  Component Value Date   HGBA1C 9.9* 01/14/2014    Urine Drug Screen:     Component Value Date/Time   LABOPIA NONE DETECTED 01/13/2014 2036   COCAINSCRNUR NONE DETECTED 01/13/2014 2036   LABBENZ NONE DETECTED 01/13/2014 2036   AMPHETMU NONE DETECTED  01/13/2014 2036   THCU NONE DETECTED 01/13/2014 2036   LABBARB NONE DETECTED 01/13/2014 2036    Alcohol Level:   Recent Labs Lab 01/13/14 1856  ETH <11    Dg Chest 2 View 01/13/2014    Similar cardiomegaly with central pulmonary vascular congestion. Rounded airspace opacity in right lung base may reflect confluence of vascular shadows, less likely confluent edema or pneumonia.  Interval removal of right PICC.       Ct Head Wo Contrast 01/13/2014    1. Increase conspicuity of small hypodensities in the left lentiform nucleus and right caudate head, suspicious for small subacute lacunar infarcts.  2. Periventricular white matter and corona radiata hypodensities favor chronic ischemic microvascular white matter disease.  3. 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    Mr Brain Ltd W/o Cm 01/14/2014    Acute ischemic infarct involving the right putamen with superior extension into the right corona radiata. No significant mass effect. Probable remote lacunar infarct noted within the pons.    Carotid Doppler - 1-39% ICA stenosis. Vertebral artery flow not insonated secondary to body habitus.   2D Echocardiogram  ejection fraction of 40-45%. No cardiac source of emboli identified.   EKG - sinus rhythm rate 92 beats per minute. For complete results please see formal report.   Therapy Recommendations - outpatient physical therapy and speech therapy recommended.  Physical Exam   Morbidly obese African American male.Awake alert. Afebrile. Head is nontraumatic. Neck is supple without bruit. Hearing is normal. Cardiac exam no murmur or gallop. Lungs are clear to auscultation. Distal pulses are well felt. NEUROLOGIC:   MENTAL STATUS: awake, alert, oriented,  follows simple commands, mild expressive aphasia. With fluctuating word finding difficulties. Able to speak short sentences. Good naming, comprehension and repetition the CRANIAL NERVES: pupils equal and  reactive to light,extraocular muscles intact, fundi were not visualized. Vision acuity and fields appear normal. facial sensation normal, uvula midline, tongue midline. Mild right facial droop. MOTOR: normal bulk and tone, Strength - 5/5 mild difficulties with fine motor movements of the right hand. Orbits left or right upper extremity. Deep tendon reflexes are symmetric. SENSORY: normal and symmetric to light touch  COORDINATION: finger-nose-finger normal - heel to shin normal  REFLEXES: deep tendon reflexes present and symmetric - no babinski    ASSESSMENT Mr. Cass Vandermeulen is a 63 y.o. male presenting with speech difficulties. TPA therapy was not initiated secondary to late presentation.MRI -acute ischemic infarct involving the right putamen with superior extension into the right corona radiata. Infarct felt  to be thrombotic secondary to small vessel disease. Probable remote lacunar infarct noted within the pons. On no antithrombotics prior to admission. Now on aspirin 325 mg orally every day for secondary stroke prevention. Patient with resultant aphasia. Stroke work up underway.   Chronic kidney disease - Creatinine 2.0  Obesity  Obstructive sleep apnea  Diabetes mellitus - hemoglobin A1c 9.9  Hyperlipidemia - cholesterol 238; LDL 145 - Lipitor added  Hypertension  Hypokalemia - corrected - now 4.3  Ejection fraction 40-45% by echo   Hospital day # 3  TREATMENT/PLAN  Continue aspirin 325 mg orally every day for secondary stroke prevention.  Therapists recommended outpatient physical and speech therapies.  Needs better diabetic control  Plan TCD's instead of MRA. (unable to do CTA secondary to chronic kidney disease ) TCDs pending.  Encourage weight loss  Possible discharge today.   Followup low ejection fraction by echo 40 - 45%.  Pt sees Dr Einar Gip.  Followup Dr. Erlinda Hong in one month.     Mikey Bussing PA-C [facility] Pager [phone] 01/16/2014, 8:37 AM  I have personally examined this patient, reviewed notes, independently viewed imaging studies, participated in medical decision making and plan of care. I have made any additions or clarifications directly to the above note. Agree with note above. I had a long discussion with the patient regarding his multiple stroke risk factors and need for aggressive risk factor modification and lifestyle changes.   SIGNED Leotis Pain    To contact Stroke Continuity provider, please refer to [email or web address] After hours, contact General Neurology

## 2014-01-16 NOTE — Progress Notes (Signed)
[facility] PROGRESS NOTE  Blake Bell XHB:[id number] DOB: 1950/09/18 DOA: 01/13/2014 PCP: [name], MD  Assessment/Plan: CVA (cerebral infarction)  - The patient is presenting with complaints of expressive aphasia.  - His CT scan is positive for subacute left lentiform and right caudate nucleus infarct.  - He has diabetes, dyslipidemia, morbid obesity, sleep apnea, hypertension as risk factors.  - PTOT and speech consultation.  - Per neurology recommendation, full dose of aspirin and statins.  - Carotid dopplers w/o stenosis - 2D echo with EF of 40-45% - Transcranial dopplers as recommended by Neuro pending Diabetes mellitus  - Uncontrolled diabetes mellitus with hemoglobin A1c of 9.9 which correlate with mean plasma glucose of 273.  - At present aspiration precaution and diabetic diet.  - Continue with home insulin and sliding scale.  Obstructive sleep apnea  - Last sleep study 2004 may require repeat sleep study as she is only using 5 cm of pressure by prior to hydration was 15 cm.  Hypertension  - Continue home medications  Chronic kidney disease  - Creatinine is about to baseline (1.7-2.0).  - Continue Lasix, elevate lower extremities.  Hypokalemia  - Repleted orally, follow BMP.  Venous stasis ulcer  - Diastolic dysfunction.  - Continue Lasix at home dose.  - Continue with the catheter  Code Status: Full Family Communication: Pt and family in room (indicate person spoken with, relationship, and if by phone, the number) Disposition Plan: Pending   Consultants:  Neurology  Procedures:    Antibiotics:   (indicate start date, and stop date if known)  HPI/Subjective: Pt reports feeling better today  Objective: Filed Vitals:   01/15/14 1852 01/15/14 2141 01/16/14 0119 01/16/14 0514  BP: 174/92 188/104 185/109 153/71  Pulse: 84 78 86 77  Temp: 97.9 F (36.6 C) 99 F (37.2 C) 98.4 F (36.9 C) 98.1 F (36.7 C)  TempSrc: Oral Oral Oral  Oral  Resp: 20 20 20 20   Height:      Weight:      SpO2: 100% 98% 97% 98%    Intake/Output Summary (Last 24 hours) at 01/16/14 0916 Last data filed at 01/15/14 1700  Gross per 24 hour  Intake    480 ml  Output      0 ml  Net    480 ml   Filed Weights   01/13/14 2229  Weight: 165.019 kg (363 lb 12.8 oz)    Exam:   General:  Awake, in nad  Cardiovascular: regular, s1, s2  Respiratory: normal resp effort, no wheezing  Abdomen: soft,nondistended  Musculoskeletal: perfused, no clubbing   Data Reviewed: Basic Metabolic Panel:  Recent Labs Lab 01/13/14 1757 01/14/14 1200 01/15/14 0500 01/15/14 1700 01/16/14 0325  NA 139 142 139 140 138  K 2.9* 3.4* 3.1* 3.2* 4.3  CL 100 103 101 97 99  CO2 26 28 28 27 24   GLUCOSE 264* 142* 180* 91 160*  BUN 16 19 21  25* 26*  CREATININE 2.00* 2.09* 2.15* 2.32* 2.14*  CALCIUM 9.0 8.8 8.8 9.0 8.6  MG  --   --  1.5  --  1.9   Liver Function Tests:  Recent Labs Lab 01/13/14 1757  AST 15  ALT 10  ALKPHOS 88  BILITOT 0.3  PROT 7.0  ALBUMIN 2.5*   No results found for this basename: LIPASE, AMYLASE,  in the last 168 hours No results found for this basename: AMMONIA,  in the last 168 hours CBC:  Recent Labs Lab 01/13/14 1757 01/13/14  1856  WBC 12.0*  --   NEUTROABS  --  9.7*  HGB 11.5*  --   HCT 35.2*  --   MCV 89.3  --   PLT 429*  --    Cardiac Enzymes: No results found for this basename: CKTOTAL, CKMB, CKMBINDEX, TROPONINI,  in the last 168 hours BNP (last 3 results)  Recent Labs  06/29/13 0330  PROBNP 1004.0*   CBG:  Recent Labs Lab 01/15/14 1640 01/15/14 1659 01/15/14 1847 01/15/14 2120 01/16/14 0640  GLUCAP 96 104* 216* 207* 157*    Recent Results (from the past 240 hour(s))  MRSA PCR SCREENING     Status: None   Collection Time    01/14/14 11:18 AM      Result Value Ref Range Status   MRSA by PCR NEGATIVE  NEGATIVE Final   Comment:            The GeneXpert MRSA Assay (FDA     approved  for NASAL specimens     only), is one component of a     comprehensive MRSA colonization     surveillance program. It is not     intended to diagnose MRSA     infection nor to guide or     monitor treatment for     MRSA infections.     Studies: No results found.  Scheduled Meds: .  stroke: mapping our early stages of recovery book   Does not apply Once  . aspirin  300 mg Rectal Daily   Or  . aspirin  325 mg Oral Daily  . atorvastatin  40 mg Oral q1800  . furosemide  240 mg Oral Daily  . heparin  5,000 Units Subcutaneous 3 times per day  . hydrALAZINE  100 mg Oral 3 times per day  . insulin glargine  25 Units Subcutaneous Daily  . labetalol  200 mg Oral TID  . pneumococcal 23 valent vaccine  0.5 mL Intramuscular Tomorrow-1000   Continuous Infusions:   Principal Problem:   CVA (cerebral infarction) Active Problems:   OSA (obstructive sleep apnea)   CKD (chronic kidney disease) stage 3, GFR 30-59 ml/min   Morbid obesity   Venous stasis ulcers   DM (diabetes mellitus), type 2  Time spent: 9min  Audrionna Lampton, [place] [facility] Pager [phone]. If 7PM-7AM, please contact night-coverage at [email or web address], password [facility] 01/16/2014, 9:16 AM  LOS: 3 days

## 2014-01-16 NOTE — Progress Notes (Signed)
RT Note: Pt states he can place himself on CPAP when ready. I told him to call if he needs further assistance.

## 2014-01-17 ENCOUNTER — Encounter: Payer: Self-pay | Admitting: Internal Medicine

## 2014-01-17 DIAGNOSIS — I10 Essential (primary) hypertension: Secondary | ICD-10-CM

## 2014-01-17 DIAGNOSIS — E1310 Other specified diabetes mellitus with ketoacidosis without coma: Secondary | ICD-10-CM

## 2014-01-17 LAB — GLUCOSE, CAPILLARY
GLUCOSE-CAPILLARY: 129 mg/dL — AB (ref 70–99)
GLUCOSE-CAPILLARY: 153 mg/dL — AB (ref 70–99)
Glucose-Capillary: 186 mg/dL — ABNORMAL HIGH (ref 70–99)
Glucose-Capillary: 168 mg/dL — ABNORMAL HIGH (ref 70–99)

## 2014-01-17 MED ORDER — ATORVASTATIN CALCIUM 80 MG PO TABS
80.0000 mg | ORAL_TABLET | Freq: Every day | ORAL | Status: DC
  Administered 2014-01-17 – 2014-01-18 (×2): 80 mg via ORAL
  Filled 2014-01-17 (×2): qty 1

## 2014-01-17 NOTE — Progress Notes (Signed)
Inpatient Diabetes Program Recommendations  AACE/ADA: New Consensus Statement on Inpatient Glycemic Control (2013)  Target Ranges:  Prepandial:   less than 140 mg/dL      Peak postprandial:   less than 180 mg/dL (1-2 hours)      Critically ill patients:  140 - 180 mg/dL   Inpatient Diabetes Program Recommendations Correction (SSI): consider adding Novolog sensitive scale TID Thank you  Nyquan Selbe BSN, RN,CDE Inpatient Diabetes Coordinator [phone] (team pager)   

## 2014-01-17 NOTE — Progress Notes (Signed)
Physical Therapy Treatment Patient Details Name: Blake Bell MRN: [id number] DOB: 07/14/1950 Today's Date: 01/17/2014    History of Present Illness  63 y.o. male, right handed,admitted due to 2-3 days of expressive deficits. 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  PMH:  HTN, DM type II, CKD, obstructive sleep apnea on CPAP, obesity, and venous stasis ulcer    PT Comments    Pt mobilized 225' with RW and min guard/supervision for pt safety. Pt given VCs to stay as close to the walker as possible and plan to bring bariatric walker to next session. PT recommends OP PT in order to increase pt's strength and independence.  Follow Up Recommendations  Outpatient PT;Supervision/Assistance - 24 hour     Equipment Recommendations  None recommended by PT       Precautions / Restrictions Precautions Precautions: Fall Restrictions Weight Bearing Restrictions: No    Mobility   Transfers Overall transfer level: Needs assistance Equipment used: Rolling walker (2 wheeled) Transfers: Sit to/from Stand Sit to Stand: Min guard;Supervision         General transfer comment: Pt able to stand with min guard/supervision for pt safety. Pt remembered to push up from the chair without VCs.  Ambulation/Gait Ambulation/Gait assistance: Min guard;Supervision Ambulation Distance (Feet): 225 Feet Assistive device: Rolling walker (2 wheeled) Gait Pattern/deviations: Step-through pattern;Decreased step length - right;Decreased step length - left;Decreased stride length;Wide base of support     General Gait Details: VCs to stay as close as possible to the walker. Plan to try bariatric walker next session so he can stay within the walker. Pt excited he did not stumble once today and would like to start walking more often.    Modified Rankin (Stroke Patients Only) Modified Rankin (Stroke Patients Only) Pre-Morbid Rankin Score: Moderate  disability Modified Rankin: Moderately severe disability     Balance Overall balance assessment: Needs assistance Sitting-balance support: No upper extremity supported;Feet supported Sitting balance-Leahy Scale: Good     Standing balance support: Bilateral upper extremity supported;During functional activity Standing balance-Leahy Scale: Poor                      Cognition Arousal/Alertness: Awake/alert Behavior During Therapy: WFL for tasks assessed/performed Overall Cognitive Status: Within Functional Limits for tasks assessed                             Pertinent Vitals/Pain See vitals flow sheet.            PT Goals (current goals can now be found in the care plan section) Acute Rehab PT Goals PT Goal Formulation: With patient Time For Goal Achievement: 01/21/14 Potential to Achieve Goals: Good Progress towards PT goals: Progressing toward goals    Frequency  Min 4X/week    PT Plan Current plan remains appropriate       End of Session Equipment Utilized During Treatment: Gait belt Activity Tolerance: Patient tolerated treatment well;Patient limited by fatigue (Pt stated after have a rest wants to walk again) Patient left: in chair;with call bell/phone within reach;with chair alarm set;with family/visitor present     Time: 5859-2924 PT Time Calculation (min): 11 min  Charges:  $Gait Training: 8-22 mins                    G CodesEber Jones, [place] [phone]

## 2014-01-17 NOTE — Progress Notes (Signed)
Speech Language Pathology Treatment: Cognitive-Linquistic  Patient Details Name: Charles Andringa MRN: [id number] DOB: 1950/06/23 Today's Date: 01/17/2014 Time: 1203-1228 SLP Time Calculation (min): 25 min  Assessment / Plan / Recommendation Clinical Impression  Expressive language is improving, but pt still has halting speech with frequent paraphasic errors.  Pt and wife both feel he is communicating more effectively.  Educated wife to ways to facilitate communication (handouts given).  Discussed f/u OP ST, and pt said "I'll think about it," stating he feels he may not need it by the time he is discharged.  Encouraged pt and wife to f/u with OP ST, and discussed the benefits of doing so.     HPI HPI: Olander Friedl is a 63 y.o. male, right handed, with a past medical history significant for HTN, DM type II, CKD, obstructive sleep apnea on CPAP, obesity, and venous stasis ulcer, transferred to [facility] for further stroke evaluation.management  Patient with 2-3 day h/o difficulty expressing himself verbally, stating the day of admission he could barely get out any words.   Pertinent Vitals n/a  SLP Plan  Continue with current plan of care    Recommendations                Oral Care Recommendations: Oral care BID;Patient independent with oral care Follow up Recommendations: Outpatient SLP Plan: Continue with current plan of care    GO     Quinn Axe T 01/17/2014, 12:58 PM

## 2014-01-17 NOTE — Progress Notes (Signed)
Read, reviewed, edited and agree with student's findings and recommendations.  Larkin Morelos B. Julie Nay, PT, DPT #[phone]  

## 2014-01-17 NOTE — Progress Notes (Signed)
CPAP humidifier refilled and pt will place himself on. RT available if needed.

## 2014-01-17 NOTE — Care Management Note (Addendum)
  Page 2 of 2   01/18/2014     10:50:26 AM CARE MANAGEMENT NOTE 01/18/2014  Patient:  Onorato,Willet   Account Number:  [id number]  Date Initiated:  01/17/2014  Documentation initiated by:  Lorne Skeens  Subjective/Objective Assessment:   Patient was admitted with CVA. Lives at home with wife.     Action/Plan:   Will follow for discharge needs pending PT/OT evals and physician orders.   Anticipated DC Date:  01/18/2014   Anticipated DC Plan:  [place]  CM consult      Choice offered to / List presented to:  C-1 Patient        [place] arranged  [place].   Status of service:  In process, will continue to follow Medicare Important Message given?   (If response is "NO", the following Medicare IM given date fields will be blank) Date Medicare IM given:   Medicare IM given by:   Date Additional Medicare IM given:   Additional Medicare IM given by:    Discharge Disposition:  [place]  Per UR Regulation:  Reviewed for med. necessity/level of care/duration of stay  If discussed at [place] of Stay Meetings, dates discussed:    Comments:  01/18/14 [place], MSN, CM- Met with patient and wife to discuss home health care. Patient is agreeable and has chosen [facility]. Mary with [facility] was notified and has accepted the referral for discharge home today. Address and phone number were verified and are accurate in the chart.   01/17/14 [place], MSN, CM- Met with patient's wife to provide private duty care agency list, as wife had concerns about needing additional care in the home. CM will continue to follow for any additional discharge needs.

## 2014-01-17 NOTE — Clinical Documentation Improvement (Signed)
Presents with CVA with Morbid Obesity (BMI of 53).    Black male  Creatinine has ranged from 2.0 to 2.32 to 2.14 an increase of 0.32 over a 2 day period  GFR has been 33 to 37 this admit  Please clarify/specify the possible types of Renal Failure you are treating from the list below. Please document findings in next Progress Note and include in Discharge Summary.  Acute Renal Failure/Acute Kidney Injury Acute on Chronic Renal Failure Chronic Renal Failure Other Condition   Thank You, Zoila Shutter ,RN Clinical Documentation Specialist:  [place] Information Management

## 2014-01-17 NOTE — Progress Notes (Signed)
[facility] PROGRESS NOTE  Blake Bell AVW:[id number] DOB: Jan 25, 1951 DOA: 01/13/2014 PCP: [name], MD  Assessment/Plan: CVA (cerebral infarction)  - The patient is presenting with complaints of expressive aphasia.  - CT scan is positive for subacute left lentiform and right caudate nucleus infarct.  - He has diabetes, dyslipidemia, morbid obesity, sleep apnea, hypertension as risk factors.  - PT/OT and speech consultation.  - Per neurology recommendation, full dose of aspirin and statins.  - Carotid dopplers w/o stenosis - 2D echo with EF of 40-45% - Transcranial dopplers as recommended by Neuro pending  Diabetes mellitus  - Uncontrolled diabetes mellitus with hemoglobin A1c of 9.9 which correlate with mean plasma glucose of 273.  - At present aspiration precaution and diabetic diet.  - Continue with home insulin and sliding scale.   Obstructive sleep apnea  - Last sleep study 2004 may require repeat sleep study as she is only using 5 cm of pressure by prior to hydration was 15 cm.   Hypertension  - Continue home medications   Chronic kidney disease AKA chronic renal failure - Creatinine is about to baseline (1.7-2.0).  - Continue Lasix, elevate lower extremities.   Hypokalemia  - Repleted orally, follow BMP.   Venous stasis ulcer  - Diastolic dysfunction.  - Continue Lasix at home dose.  - Continue with the catheter  Code Status: Full Family Communication: Pt and family in room Disposition Plan: needs new PT eval   Consultants:  Neurology  Procedures:    Antibiotics:   (indicate start date, and stop date if known)  HPI/Subjective: Pt reports feeling better today  Objective: Filed Vitals:   01/16/14 1050 01/16/14 1500 01/16/14 1854 01/16/14 2128  BP: 158/78 179/93 156/78 173/83  Pulse: 76 70 75 78  Temp: 98.4 F (36.9 C) 98.5 F (36.9 C) 98 F (36.7 C) 98 F (36.7 C)  TempSrc: Oral Oral Oral Oral  Resp: 20 20 20 20    Height:      Weight:      SpO2: 97% 95% 92% 96%    Intake/Output Summary (Last 24 hours) at 01/17/14 0953 Last data filed at 01/16/14 1700  Gross per 24 hour  Intake    500 ml  Output      0 ml  Net    500 ml   Filed Weights   01/13/14 2229  Weight: 165.019 kg (363 lb 12.8 oz)    Exam:   General:  Awake, in nad  Cardiovascular: regular, s1, s2  Respiratory: normal resp effort, no wheezing  Abdomen: soft,nondistended  Musculoskeletal: perfused, no clubbing   Data Reviewed: Basic Metabolic Panel:  Recent Labs Lab 01/13/14 1757 01/14/14 1200 01/15/14 0500 01/15/14 1700 01/16/14 0325  NA 139 142 139 140 138  K 2.9* 3.4* 3.1* 3.2* 4.3  CL 100 103 101 97 99  CO2 26 28 28 27 24   GLUCOSE 264* 142* 180* 91 160*  BUN 16 19 21  25* 26*  CREATININE 2.00* 2.09* 2.15* 2.32* 2.14*  CALCIUM 9.0 8.8 8.8 9.0 8.6  MG  --   --  1.5  --  1.9   Liver Function Tests:  Recent Labs Lab 01/13/14 1757  AST 15  ALT 10  ALKPHOS 88  BILITOT 0.3  PROT 7.0  ALBUMIN 2.5*   No results found for this basename: LIPASE, AMYLASE,  in the last 168 hours No results found for this basename: AMMONIA,  in the last 168 hours CBC:  Recent Labs Lab 01/13/14 1757 01/13/14  1856  WBC 12.0*  --   NEUTROABS  --  9.7*  HGB 11.5*  --   HCT 35.2*  --   MCV 89.3  --   PLT 429*  --    Cardiac Enzymes: No results found for this basename: CKTOTAL, CKMB, CKMBINDEX, TROPONINI,  in the last 168 hours BNP (last 3 results)  Recent Labs  06/29/13 0330  PROBNP 1004.0*   CBG:  Recent Labs Lab 01/16/14 0640 01/16/14 1146 01/16/14 1631 01/16/14 2127 01/17/14 0643  GLUCAP 157* 191* 191* 144* 129*    Recent Results (from the past 240 hour(s))  MRSA PCR SCREENING     Status: None   Collection Time    01/14/14 11:18 AM      Result Value Ref Range Status   MRSA by PCR NEGATIVE  NEGATIVE Final   Comment:            The GeneXpert MRSA Assay (FDA     approved for NASAL specimens      only), is one component of a     comprehensive MRSA colonization     surveillance program. It is not     intended to diagnose MRSA     infection nor to guide or     monitor treatment for     MRSA infections.     Studies: No results found.  Scheduled Meds: .  stroke: mapping our early stages of recovery book   Does not apply Once  . aspirin  300 mg Rectal Daily   Or  . aspirin  325 mg Oral Daily  . atorvastatin  40 mg Oral q1800  . furosemide  240 mg Oral Daily  . heparin  5,000 Units Subcutaneous 3 times per day  . hydrALAZINE  100 mg Oral 3 times per day  . insulin glargine  25 Units Subcutaneous Daily  . labetalol  200 mg Oral TID   Continuous Infusions:   Principal Problem:   CVA (cerebral infarction) Active Problems:   OSA (obstructive sleep apnea)   CKD (chronic kidney disease) stage 3, GFR 30-59 ml/min   Morbid obesity   Venous stasis ulcers   DM (diabetes mellitus), type 2  Time spent: 97min  VANN, [place] [facility] Pager [phone]. If 7PM-7AM, please contact night-coverage at [email or web address], password [facility] 01/17/2014, 9:53 AM  LOS: 4 days

## 2014-01-17 NOTE — Progress Notes (Signed)
Stroke Team Progress Note  HISTORY Blake Bell is a 63 y.o. male, right handed, with a past medical history significant for HTN, DM type II, CKD, obstructive sleep apnea on CPAP, obesity, and venous stasis ulcer, transferred to [facility] for further stroke evaluation.management.  Mr. Blake Bell stated that 3 days prior to admission he started noticing difficulty expressing himself, " couldn't get my words out even knowing what I wanted to say". He said that this problem got progressively worse and pt's family called PCP who advised them to bring pt to ER for evaluation for "possible mini-stroke  Pt denied associated HA, vertigo, double vision, difficulty swallowing, confusion, imbalance, focal weakness or numbness, vision disturbances, chest pain, SOB, or palpitations.  CT brain " increase conspicuity of small hypodensities in the left lentiform nucleus and right caudate head, suspicious for small subacute lacunar infarcts".  Started on aspirin during this admission.   Date last known well: 01/11/14  Time last known well: uncertain  tPA Given: no, out of the window.  SUBJECTIVE No acute events overnight. Family is at the bedside. The patient is alert and conversant. Mild speech difficulty. BP still high this am.   OBJECTIVE Most recent Vital Signs: Filed Vitals:   01/16/14 1500 01/16/14 1854 01/16/14 2128 01/17/14 1011  BP: 179/93 156/78 173/83 191/99  Pulse: 70 75 78 70  Temp: 98.5 F (36.9 C) 98 F (36.7 C) 98 F (36.7 C) 97.6 F (36.4 C)  TempSrc: Oral Oral Oral Oral  Resp: 20 20 20 20   Height:      Weight:      SpO2: 95% 92% 96% 97%   CBG (last 3)   Recent Labs  01/16/14 1631 01/16/14 2127 01/17/14 0643  GLUCAP 191* 144* 129*    IV Fluid Intake:     MEDICATIONS  .  stroke: mapping our early stages of recovery book   Does not apply Once  . aspirin  300 mg Rectal Daily   Or  . aspirin  325 mg Oral Daily  . atorvastatin  40 mg Oral q1800  . furosemide  240 mg Oral  Daily  . heparin  5,000 Units Subcutaneous 3 times per day  . hydrALAZINE  100 mg Oral 3 times per day  . insulin glargine  25 Units Subcutaneous Daily  . labetalol  200 mg Oral TID   PRN:  senna-docusate  Diet:    heart healthy carb modified diet with thin liquids Activity: Up with assistance DVT Prophylaxis:  Subcutaneous heparin  CLINICALLY SIGNIFICANT STUDIES Basic Metabolic Panel:   Recent Labs Lab 01/15/14 0500 01/15/14 1700 01/16/14 0325  NA 139 140 138  K 3.1* 3.2* 4.3  CL 101 97 99  CO2 28 27 24   GLUCOSE 180* 91 160*  BUN 21 25* 26*  CREATININE 2.15* 2.32* 2.14*  CALCIUM 8.8 9.0 8.6  MG 1.5  --  1.9   Liver Function Tests:   Recent Labs Lab 01/13/14 1757  AST 15  ALT 10  ALKPHOS 88  BILITOT 0.3  PROT 7.0  ALBUMIN 2.5*   CBC:   Recent Labs Lab 01/13/14 1757 01/13/14 1856  WBC 12.0*  --   NEUTROABS  --  9.7*  HGB 11.5*  --   HCT 35.2*  --   MCV 89.3  --   PLT 429*  --    Coagulation:   Recent Labs Lab 01/13/14 1856  LABPROT 13.3  INR 1.01   Cardiac Enzymes: No results found for this basename: CKTOTAL, CKMB,  CKMBINDEX, TROPONINI,  in the last 168 hours Urinalysis:   Recent Labs Lab 01/13/14 2036  COLORURINE YELLOW  LABSPEC 1.027  PHURINE 7.0  GLUCOSEU 250*  HGBUR SMALL*  BILIRUBINUR NEGATIVE  KETONESUR NEGATIVE  PROTEINUR >300*  UROBILINOGEN 0.2  NITRITE NEGATIVE  LEUKOCYTESUR NEGATIVE   Lipid Panel    Component Value Date/Time   CHOL 238* 01/14/2014 0548   TRIG 143 01/14/2014 0548   HDL 64 01/14/2014 0548   CHOLHDL 3.7 01/14/2014 0548   VLDL 29 01/14/2014 0548   LDLCALC 145* 01/14/2014 0548   HgbA1C  Lab Results  Component Value Date   HGBA1C 9.9* 01/14/2014    Urine Drug Screen:     Component Value Date/Time   LABOPIA NONE DETECTED 01/13/2014 2036   COCAINSCRNUR NONE DETECTED 01/13/2014 2036   LABBENZ NONE DETECTED 01/13/2014 2036   AMPHETMU NONE DETECTED 01/13/2014 2036   THCU NONE DETECTED 01/13/2014 2036   LABBARB  NONE DETECTED 01/13/2014 2036    Alcohol Level:   Recent Labs Lab 01/13/14 1856  ETH <11    Dg Chest 2 View 01/13/2014    Similar cardiomegaly with central pulmonary vascular congestion. Rounded airspace opacity in right lung base may reflect confluence of vascular shadows, less likely confluent edema or pneumonia.  Interval removal of right PICC.     Ct Head Wo Contrast 01/13/2014    1. Increase conspicuity of small hypodensities in the left lentiform nucleus and right caudate head, suspicious for small subacute lacunar infarcts.  2. Periventricular white matter and corona radiata hypodensities favor chronic ischemic microvascular white matter disease.  3. 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   Mr Brain Ltd W/o Cm 01/14/2014    Acute ischemic infarct involving the right putamen with superior extension into the right corona radiata. No significant mass effect. Probable remote lacunar infarct noted within the pons.  Carotid Doppler - 1-39% ICA stenosis. Vertebral artery flow not insonated secondary to body habitus.  2D Echocardiogram  ejection fraction of 40-45%. No cardiac source of emboli identified.  EKG - sinus rhythm rate 92 beats per minute. For complete results please see formal report.   Therapy Recommendations - outpatient physical therapy and speech therapy recommended.  Physical Exam   Morbidly obese African American male.Awake alert. Afebrile. Head is nontraumatic. Neck is supple without bruit. Hearing is normal. Cardiac exam no murmur or gallop. Lungs are clear to auscultation. Distal pulses are well felt.  NEUROLOGIC:   MENTAL STATUS: awake, alert, oriented,  follows simple commands, mild expressive aphasia. With fluctuating word finding difficulties. Able to speak short sentences. Good naming, comprehension and repetition  CRANIAL NERVES: pupils equal and reactive to light,extraocular muscles intact, fundi were not visualized. Vision  acuity and fields appear normal. facial sensation normal, uvula midline, tongue midline. Mild right facial droop. MOTOR: normal bulk and tone, Strength - 5/5 mild difficulties with fine motor movements of the right hand. Deep tendon reflexes are symmetric. SENSORY: normal and symmetric to light touch  COORDINATION: finger-nose-finger normal - heel to shin normal  REFLEXES: did not assess   ASSESSMENT Mr. Blake Bell is a 63 y.o. male presenting with speech difficulties. TPA therapy was not initiated secondary to late presentation.MRI -acute ischemic infarct involving the right putamen with superior extension into the right corona radiata. Infarct felt to be thrombotic secondary to small vessel disease. Probable remote lacunar infarct noted within the pons. On no antithrombotics prior to admission. Now on aspirin 325 mg orally  every day for secondary stroke prevention. Patient with resultant aphasia. Stroke work up underway.   Chronic kidney disease - Creatinine 2.14  Obesity  Obstructive sleep apnea  Diabetes mellitus - hemoglobin A1c 9.9, not well controlled.   Hyperlipidemia - cholesterol 238; LDL 145 - Lipitor added, tolerating well, increase to 80 mg daily, goal LDL <70 due to DM  Hypertension-BP 156-191/78-99 in last 24 hours, goal BP <130/80 due to DM   Hypokalemia - corrected - now 4.3  Ejection fraction 40-45% by echo  Carotid Dopplers <39% stenosis bilat ICAs   Hospital day # 4  TREATMENT/PLAN  Continue aspirin 325 mg orally every day for secondary stroke prevention.  Therapists recommended outpatient physical and speech therapies.  Needs better diabetic control  Hyperlipidemia - cholesterol 238; LDL 145 - Lipitor added, tolerating well, increase to 80 mg daily, goal LDL <70 due to DM  Hypertension-BP 156-191/78-99 in last 24 hours, goal BP <130/80 due to DM   Completed TCD's instead of MRA. (unable to do CTA secondary to chronic kidney disease) did not  suggest any significant stenosis or occlusion by preliminary read.   Encourage weight loss   Followup low ejection fraction by echo 40 - 45%.  Pt sees Dr Einar Gip.  Stroke workup complete. Stroke to sign off. Follow up Dr. Erlinda Hong in two months.    Delbert Phenix, MSN, ANP-C, CNRN, MSCS [facility] Team [phone] 01/17/2014, 10:41 AM   I, the attending vascular neurologist, have personally obtained a history, examined the patient, evaluated laboratory data, individually viewed imaging studies, and formulated the assessment and plan of care.  I have made any additions or clarifications directly to the above note and agree with the findings and plan as currently documented.   Rosalin Hawking, MD PhD 01/17/2014 5:36 PM   To contact Stroke Continuity provider, please refer to [email or web address] After hours, contact General Neurology

## 2014-01-18 DIAGNOSIS — I10 Essential (primary) hypertension: Secondary | ICD-10-CM

## 2014-01-18 LAB — GLUCOSE, CAPILLARY
GLUCOSE-CAPILLARY: 224 mg/dL — AB (ref 70–99)
GLUCOSE-CAPILLARY: 102 mg/dL — AB (ref 70–99)
Glucose-Capillary: 131 mg/dL — ABNORMAL HIGH (ref 70–99)

## 2014-01-18 MED ORDER — ATORVASTATIN CALCIUM 80 MG PO TABS: 80.0000 mg | ORAL_TABLET | Freq: Every day | ORAL | Status: DC

## 2014-01-18 MED ORDER — SENNOSIDES-DOCUSATE SODIUM 8.6-50 MG PO TABS: 1.0000 | ORAL_TABLET | Freq: Every evening | ORAL | Status: DC | PRN

## 2014-01-18 MED ORDER — ASPIRIN 325 MG PO TABS: 325.0000 mg | ORAL_TABLET | Freq: Every day | ORAL | Status: DC

## 2014-01-18 NOTE — Discharge Summary (Signed)
Physician Discharge Summary  Robertt Buda GEX:[id number] DOB: 1950/10/30 DOA: 01/13/2014  PCP: [name], MD  Admit date: 01/13/2014 Discharge date: 01/18/2014  Time spent: 35 minutes  Recommendations for Outpatient Follow-up:  1. Titrate BP meds for better control  2. Cbc, bmp periodically 3. Titrate cpap as outpatient  Discharge Diagnoses:  Principal Problem:   CVA (cerebral infarction) Active Problems:   OSA (obstructive sleep apnea)   CKD (chronic kidney disease) stage 3, GFR 30-59 ml/min   Morbid obesity   Venous stasis ulcers   DM (diabetes mellitus) type 2, uncontrolled, with ketoacidosis   Discharge Condition: improved  Diet recommendation: cardiac/diabetic  Filed Weights   01/13/14 2229  Weight: 165.019 kg (363 lb 12.8 oz)    History of present illness:  Blake Bell is a 63 y.o. male with Past medical history of OSA, HTN, DM2, morbid obesity, CKD, venous stasis ulcer and diastolic dysfunction.  The pt presented with difficulty with speech that has been on going since last 2 days. He mentions that he woke up with this symptoms and has been having difficulty bringing out the right words.  No difficulty with understanding or hearing or vision or swallowing.  Pt denies any fever, chills, headache, cough, chest pain, palpitation, shortness of breath, orthopnea, PND, nausea, vomiting, abdominal pain, diarrhea, constipation, active bleeding, burning urination, dizziness, pedal edema, no other focal neurological deficit.  No recent changes in his medication.  He is compliant with his CPAP.   Hospital Course:  CVA (cerebral infarction)  - The patient is presenting with complaints of expressive aphasia.  - CT scan is positive for subacute left lentiform and right caudate nucleus infarct.  - He has diabetes, dyslipidemia, morbid obesity, sleep apnea, hypertension as risk factors.  - Per neurology recommendation, full dose of aspirin and statins.  -  Carotid dopplers w/o stenosis  - 2D echo with EF of 40-45%  - Transcranial dopplers ok per neuro  -home health with 24 hour supervision by family  Diabetes mellitus  - Uncontrolled diabetes mellitus with hemoglobin A1c of 9.9 which correlate with mean plasma glucose of 273.  - Continue with home insulin -titrate as outpatient   Obstructive sleep apnea  - Last sleep study 2004 may require repeat sleep study   Hypertension  - Continue home medications  -outpatient titration for optimal control  Chronic kidney disease AKA chronic renal failure  - Creatinine is about to baseline (1.7-2.0).  - Continue Lasix, elevate lower extremities.      Procedures:  TCD  echo  Consultations:  neurology  Discharge Exam: Filed Vitals:   01/18/14 0923  BP: 158/92  Pulse: 77  Temp: 97.7 F (36.5 C)  Resp: 22    General: A+Ox3, NAd Cardiovascular: rrr Respiratory: clear  Discharge Instructions You were cared for by a hospitalist during your hospital stay. If you have any questions about your discharge medications or the care you received while you were in the hospital after you are discharged, you can call the unit and asked to speak with the hospitalist on call if the hospitalist that took care of you is not available. Once you are discharged, your primary care physician will handle any further medical issues. Please note that NO REFILLS for any discharge medications will be authorized once you are discharged, as it is imperative that you return to your primary care physician (or establish a relationship with a primary care physician if you do not have one) for your aftercare needs so that they can  reassess your need for medications and monitor your lab values.  Discharge Instructions   Diet - low sodium heart healthy    Complete by:  As directed      Diet Carb Modified    Complete by:  As directed      Discharge instructions    Complete by:  As directed   Home health PT/SLP 24  hour supervision by family No driving     Increase activity slowly    Complete by:  As directed             Medication List    STOP taking these medications       insulin aspart 100 UNIT/ML injection  Commonly known as:  novoLOG      TAKE these medications       aspirin 325 MG tablet  Take 1 tablet (325 mg total) by mouth daily.     atorvastatin 80 MG tablet  Commonly known as:  LIPITOR  Take 1 tablet (80 mg total) by mouth daily at 6 PM.     cabergoline 0.5 MG tablet  Commonly known as:  DOSTINEX  Take 0.5 tablets (0.25 mg total) by mouth 2 (two) times a week.     furosemide 80 MG tablet  Commonly known as:  LASIX  Take 240 mg by mouth daily.     hydrALAZINE 100 MG tablet  Commonly known as:  APRESOLINE  Take 1 tablet (100 mg total) by mouth every 8 (eight) hours.     insulin glargine 100 UNIT/ML injection  Commonly known as:  LANTUS  Inject 0.3 mLs (30 Units total) into the skin daily.     labetalol 200 MG tablet  Commonly known as:  NORMODYNE  Take 200 mg by mouth 3 (three) times daily.     senna-docusate 8.6-50 MG per tablet  Commonly known as:  Senokot-S  Take 1 tablet by mouth at bedtime as needed for mild constipation.     TANZEUM 30 MG Pen  Generic drug:  Albiglutide  Inject 30 mg into the skin once a week.       No Known Allergies     Follow-up Information   Follow up with Xu,Jindong, MD In 1 month.   Specialty:  Neurology   Contact information:   [address] [place] [address] [phone]       Follow up with [name], MD In 1 week.   Specialty:  Internal Medicine   Contact information:   [address] [place] [address] [phone]        The results of significant diagnostics from this hospitalization (including imaging, microbiology, ancillary and laboratory) are listed below for reference.    Significant Diagnostic Studies: Dg Chest 2 View  01/13/2014   CLINICAL DATA:   Stroke, weakness  EXAM: CHEST  2 VIEW  COMPARISON:  Chest radiograph July 01, 2013  FINDINGS: MR cardiomegaly, a with consideration to this low inspiratory portable examination with crowded, central engorged vascular markings. Mediastinal silhouette is nonsuspicious. No pleural effusions. Rounded airspace opacity in right lung base. No pneumothorax.  Interval removal of right PICC. Soft tissue planes and included osseous structures are nonsuspicious.  IMPRESSION: Similar cardiomegaly with central pulmonary vascular congestion. Rounded airspace opacity in right lung base may reflect confluence of vascular shadows, less likely confluent edema or pneumonia.  Interval removal of right PICC.   Electronically Signed   By: Elon Alas   On: 01/13/2014 20:57  Ct Head Wo Contrast  01/13/2014   CLINICAL DATA:  Slurred speech.  Altered mental status.  EXAM: CT HEAD WITHOUT CONTRAST  TECHNIQUE: Contiguous axial images were obtained from the base of the skull through the vertex without intravenous contrast.  COMPARISON:  Multiple exams, including 08/02/2013  FINDINGS: Small hypodensity in the head of the right caudate nucleus, image 16 of series 2, proximally 3 mm in diameter, not readily seen on prior exams, possibly a small lacunar infarct. Several hypodensities in the left lentiform nucleus also demonstrate increase conspicuity and could represent subacute lacunar infarcts.  Periventricular white matter and corona radiata hypodensities favor chronic ischemic microvascular white matter disease. The brainstem, cerebellum, cerebral peduncle is, and thalami appear unremarkable.  Right eccentric similar lesion observed as on prior exams. No intracranial hemorrhage arm other mass lesion identified.  IMPRESSION: 1. Increase conspicuity of small hypodensities in the left lentiform nucleus and right caudate head, suspicious for small subacute lacunar infarcts. 2. Periventricular white matter and corona radiata  hypodensities favor chronic ischemic microvascular white matter disease. 3. 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 These results were called by telephone at the time of interpretation on 01/13/2014 at 7:04 pm to Dr. Riki Altes , who verbally acknowledged these results.   Electronically Signed   By: Sherryl Barters M.D.   On: 01/13/2014 19:08   Mr Brain Ltd W/o Cm  01/14/2014   CLINICAL DATA:  Speech difficulty  EXAM: MRI HEAD WITHOUT CONTRAST  TECHNIQUE: Multiplanar, multiecho pulse sequences of the brain and surrounding structures were obtained without intravenous contrast.  COMPARISON:  Prior CT from earlier the same day as well as previous MRI from 08/02/2013.  FINDINGS: 9 mm focus of restricted diffusion present within the posterior aspect of the putamen, consistent with acute ischemic infarct. There is superior extension into the left corona radiata with probable involvement of the body of the left caudate (series 4, image 19). These correspond with previously seen hypodensities seen on prior CT. No definite associated hemorrhage. No significant mass effect. No other acute ischemic infarct.  Sagittal T1 weighted view of the brain demonstrates no other acute abnormality. Sellar enlargement again noted. Craniocervical junction within normal limits. Probable remote lacunar infarct noted within the pons.  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 No significant mass effect.   Electronically Signed   By: Jeannine Boga M.D.   On: 01/14/2014 00:22    Microbiology: Recent Results (from the past 240 hour(s))  MRSA PCR SCREENING     Status: None   Collection Time    01/14/14 11:18 AM      Result Value Ref Range Status   MRSA by PCR NEGATIVE  NEGATIVE Final   Comment:            The GeneXpert MRSA Assay (FDA     approved for NASAL specimens     only), is one component of a     comprehensive  MRSA colonization     surveillance program. It is not     intended to diagnose MRSA     infection nor to guide or     monitor treatment for     MRSA infections.     Labs: Basic Metabolic Panel:  Recent Labs Lab 01/13/14 1757 01/14/14 1200 01/15/14 0500 01/15/14 1700 01/16/14 0325  NA 139 142 139 140 138  K 2.9* 3.4* 3.1* 3.2* 4.3  CL 100 103  101 97 99  CO2 26 28 28 27 24   GLUCOSE 264* 142* 180* 91 160*  BUN 16 19 21  25* 26*  CREATININE 2.00* 2.09* 2.15* 2.32* 2.14*  CALCIUM 9.0 8.8 8.8 9.0 8.6  MG  --   --  1.5  --  1.9   Liver Function Tests:  Recent Labs Lab 01/13/14 1757  AST 15  ALT 10  ALKPHOS 88  BILITOT 0.3  PROT 7.0  ALBUMIN 2.5*   No results found for this basename: LIPASE, AMYLASE,  in the last 168 hours No results found for this basename: AMMONIA,  in the last 168 hours CBC:  Recent Labs Lab 01/13/14 1757 01/13/14 1856  WBC 12.0*  --   NEUTROABS  --  9.7*  HGB 11.5*  --   HCT 35.2*  --   MCV 89.3  --   PLT 429*  --    Cardiac Enzymes: No results found for this basename: CKTOTAL, CKMB, CKMBINDEX, TROPONINI,  in the last 168 hours BNP: BNP (last 3 results)  Recent Labs  06/29/13 0330  PROBNP 1004.0*   CBG:  Recent Labs Lab 01/17/14 0643 01/17/14 1151 01/17/14 1658 01/17/14 2127 01/18/14 0659  GLUCAP 129* 153* 186* 168* 131*       Signed:  VANN, JESSICA  [facility] 01/18/2014, 10:09 AM

## 2014-01-18 NOTE — Progress Notes (Signed)
Patient's wife said that their ride will come sometime in the evening.

## 2014-01-18 NOTE — Progress Notes (Signed)
Physical Therapy Treatment Patient Details Name: Blake Bell MRN: [id number] DOB: [birth year] Today's Date: 01/18/2014    History of Present Illness  63 y.o. male, right handed,admitted due to 2-3 days of expressive deficits. 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  PMH:  HTN, DM type II, CKD, obstructive sleep apnea on CPAP, obesity, and venous stasis ulcer    PT Comments    Pt is progressing well with mobility.  He wanted to try gait with a cane today and required mildly more assistance (min with cane, min guard with RW).  I encouraged continue use of the RW, but he is persistent that he wants to use the cane.  I told him if his wife sees that he is stumbling and staggering that he needs to switch to the RW and he agreed.  Pt would benefit from HHPT at d/c.   Follow Up Recommendations  Home health PT     Equipment Recommendations  None recommended by PT    Recommendations for Other Services   NA     Precautions / Restrictions Precautions Precautions: Fall Precaution Comments: left sided weakness, decreased awareness of deficits and decreased safety awareness    Mobility         Transfers Overall transfer level: Needs assistance Equipment used: Straight cane Transfers: Sit to/from Stand Sit to Stand: Supervision         General transfer comment: heavy reliance on upper extremity support during transitions.   Ambulation/Gait Ambulation/Gait assistance: Min assist Ambulation Distance (Feet): 225 Feet Assistive device: Straight cane Gait Pattern/deviations: Step-through pattern;Staggering right;Drifts right/left;Decreased dorsiflexion - left;Decreased weight shift to left     General Gait Details: Pt with more staggering gait pattern with use of the cane.  He shows signs of fatigue with his left leg towards the end of gait (decreased knee control, dragging left foot).  Pt also running into door frame on the right and more  consistantly staggering to the right during gait today.  I encouraged pt to use RW for increased stability and safety, he continues to want to use the cane instead.        Modified Rankin (Stroke Patients Only) Modified Rankin (Stroke Patients Only) Pre-Morbid Rankin Score: Moderate disability Modified Rankin: Moderately severe disability     Balance Overall balance assessment: Needs assistance Sitting-balance support: Feet supported;No upper extremity supported Sitting balance-Leahy Scale: Good     Standing balance support: Single extremity supported Standing balance-Leahy Scale: Fair Standing balance comment: needs external assist during dynamic movements                    Cognition Arousal/Alertness: Awake/alert Behavior During Therapy: WFL for tasks assessed/performed Overall Cognitive Status: Impaired/Different from baseline Area of Impairment: Safety/judgement;Awareness         Safety/Judgement: Decreased awareness of safety;Decreased awareness of deficits Awareness: Intellectual   General Comments: Pt reporting verbally to family on the phone that he does not have any deficits from the two strokes he has had.    Exercises General Exercises - Upper Extremity Shoulder Flexion: AROM;Both;10 reps;Seated Elbow Flexion: AROM;Both;10 reps;Seated General Exercises - Lower Extremity Ankle Circles/Pumps: AROM;Both;10 reps;Supine Long Arc Quad: AROM;Both;10 reps;Seated Hip ABduction/ADduction: AROM;Both;10 reps;Seated (adduction against a pilloe) Hip Flexion/Marching: AROM;Both;10 reps;Seated Toe Raises: AROM;Both;10 reps;Seated Heel Raises: AROM;Both;10 reps;Seated        Pertinent Vitals/Pain See vitals flow sheet.            PT Goals (current goals can  now be found in the care plan section) Acute Rehab PT Goals Patient Stated Goal: to go home Progress towards PT goals: Progressing toward goals    Frequency  Min 4X/week    PT Plan Discharge  plan needs to be updated       End of Session Equipment Utilized During Treatment: Gait belt Activity Tolerance: Patient tolerated treatment well Patient left: in chair;with call bell/phone within reach;with family/visitor present     Time: 3568-6168 PT Time Calculation (min): 35 min  Charges:  $Gait Training: 8-22 mins $Therapeutic Exercise: 8-22 mins                      Ellana Kawa B. Pillager, Real, DPT [phone]   01/18/2014, 3:45 PM

## 2014-01-18 NOTE — Progress Notes (Signed)
Patient D/C home, D/C instructions given, pt verbalized and care giver verbalized understanding. Condition stable.

## 2014-01-19 ENCOUNTER — Encounter (HOSPITAL_BASED_OUTPATIENT_CLINIC_OR_DEPARTMENT_OTHER): Payer: BC Managed Care – PPO | Attending: General Surgery

## 2014-01-19 DIAGNOSIS — L97809 Non-pressure chronic ulcer of other part of unspecified lower leg with unspecified severity: Secondary | ICD-10-CM | POA: Insufficient documentation

## 2014-01-26 DIAGNOSIS — L97809 Non-pressure chronic ulcer of other part of unspecified lower leg with unspecified severity: Secondary | ICD-10-CM | POA: Diagnosis not present

## 2014-01-26 LAB — GLUCOSE, CAPILLARY: GLUCOSE-CAPILLARY: 182 mg/dL — AB (ref 70–99)

## 2014-02-02 DIAGNOSIS — L97809 Non-pressure chronic ulcer of other part of unspecified lower leg with unspecified severity: Secondary | ICD-10-CM | POA: Diagnosis not present

## 2014-02-09 DIAGNOSIS — L97809 Non-pressure chronic ulcer of other part of unspecified lower leg with unspecified severity: Secondary | ICD-10-CM | POA: Diagnosis not present

## 2014-02-09 LAB — GLUCOSE, CAPILLARY: Glucose-Capillary: 167 mg/dL — ABNORMAL HIGH (ref 70–99)

## 2014-02-16 ENCOUNTER — Encounter (HOSPITAL_BASED_OUTPATIENT_CLINIC_OR_DEPARTMENT_OTHER): Payer: BC Managed Care – PPO | Attending: General Surgery

## 2014-02-16 DIAGNOSIS — I872 Venous insufficiency (chronic) (peripheral): Secondary | ICD-10-CM | POA: Diagnosis not present

## 2014-02-16 DIAGNOSIS — L97909 Non-pressure chronic ulcer of unspecified part of unspecified lower leg with unspecified severity: Secondary | ICD-10-CM | POA: Insufficient documentation

## 2014-02-25 DIAGNOSIS — L97909 Non-pressure chronic ulcer of unspecified part of unspecified lower leg with unspecified severity: Secondary | ICD-10-CM | POA: Diagnosis not present

## 2014-02-25 DIAGNOSIS — I872 Venous insufficiency (chronic) (peripheral): Secondary | ICD-10-CM | POA: Diagnosis not present

## 2014-02-25 LAB — GLUCOSE, CAPILLARY: Glucose-Capillary: 205 mg/dL — ABNORMAL HIGH (ref 70–99)

## 2014-03-04 DIAGNOSIS — L97909 Non-pressure chronic ulcer of unspecified part of unspecified lower leg with unspecified severity: Secondary | ICD-10-CM | POA: Diagnosis not present

## 2014-03-04 DIAGNOSIS — I872 Venous insufficiency (chronic) (peripheral): Secondary | ICD-10-CM | POA: Diagnosis not present

## 2014-03-09 ENCOUNTER — Ambulatory Visit: Payer: BC Managed Care – PPO | Admitting: Neurology

## 2014-03-09 DIAGNOSIS — L97909 Non-pressure chronic ulcer of unspecified part of unspecified lower leg with unspecified severity: Secondary | ICD-10-CM | POA: Diagnosis not present

## 2014-03-09 DIAGNOSIS — I872 Venous insufficiency (chronic) (peripheral): Secondary | ICD-10-CM | POA: Diagnosis not present

## 2014-03-16 DIAGNOSIS — L97909 Non-pressure chronic ulcer of unspecified part of unspecified lower leg with unspecified severity: Secondary | ICD-10-CM | POA: Diagnosis not present

## 2014-03-16 DIAGNOSIS — I872 Venous insufficiency (chronic) (peripheral): Secondary | ICD-10-CM | POA: Diagnosis not present

## 2014-03-21 ENCOUNTER — Ambulatory Visit (HOSPITAL_COMMUNITY)
Admission: RE | Admit: 2014-03-21 | Discharge: 2014-03-21 | Disposition: A | Discharge status: Home / Self Care | Payer: BC Managed Care – PPO | Source: Ambulatory Visit | Attending: Surgery | Admitting: Surgery

## 2014-03-21 ENCOUNTER — Other Ambulatory Visit (HOSPITAL_COMMUNITY): Payer: Self-pay | Admitting: General Surgery

## 2014-03-21 DIAGNOSIS — I872 Venous insufficiency (chronic) (peripheral): Secondary | ICD-10-CM

## 2014-03-23 ENCOUNTER — Encounter (HOSPITAL_BASED_OUTPATIENT_CLINIC_OR_DEPARTMENT_OTHER): Payer: BC Managed Care – PPO | Attending: General Surgery

## 2014-03-23 DIAGNOSIS — L97919 Non-pressure chronic ulcer of unspecified part of right lower leg with unspecified severity: Secondary | ICD-10-CM | POA: Diagnosis not present

## 2014-03-23 DIAGNOSIS — I87331 Chronic venous hypertension (idiopathic) with ulcer and inflammation of right lower extremity: Secondary | ICD-10-CM | POA: Diagnosis present

## 2014-03-24 ENCOUNTER — Ambulatory Visit (INDEPENDENT_AMBULATORY_CARE_PROVIDER_SITE_OTHER): Payer: BC Managed Care – PPO | Admitting: Neurology

## 2014-03-24 ENCOUNTER — Encounter: Payer: Self-pay | Admitting: Neurology

## 2014-03-24 VITALS — BP 148/74 | HR 74 | Ht 69.5 in | Wt 352.0 lb

## 2014-03-24 DIAGNOSIS — I639 Cerebral infarction, unspecified: Secondary | ICD-10-CM

## 2014-03-24 DIAGNOSIS — I10 Essential (primary) hypertension: Secondary | ICD-10-CM

## 2014-03-24 DIAGNOSIS — E785 Hyperlipidemia, unspecified: Secondary | ICD-10-CM

## 2014-03-24 DIAGNOSIS — E118 Type 2 diabetes mellitus with unspecified complications: Secondary | ICD-10-CM

## 2014-03-24 NOTE — Patient Instructions (Addendum)
-   continue ASA and lipitor for stroke prevention - follow up with Dr. Karlton Lemon for stroke risk factor modification - also mention to Dr. Karlton Lemon that you may need endocrinologist or neurosurgeon to follow up with your pituitary tumor. - follow up with Dr. Einar Gip for blood pressure and CHF management too - check BP and sugar at home and record - continue CPAP for OSA.  - regular exercise and lose weight - RTC in 6 months.

## 2014-03-24 NOTE — Progress Notes (Signed)
STROKE NEUROLOGY FOLLOW UP NOTE  NAME: Blake Bell DOB: 1951-03-11  REASON FOR VISIT: stroke follow up HISTORY FROM: chart  Today we had the pleasure of seeing Blake Bell in follow-up at our Neurology Clinic. Pt was accompanied by wife.   History Summary Blake Bell is a 63 y.o. male, right handed, with a past medical history significant for HTN, DM type II, HLD, CKD, obstructive sleep apnea on CPAP, obesity, and venous stasis ulcer admitted to [facility] on 01/13/14 for speech difficulty for 3 days. CT brain " increase conspicuity of small hypodensities in the left lentiform nucleus and right caudate head, suspicious for small subacute lacunar infarcts". MRI confirmed right putamen and corona radiata stroke. His A1C 9.9 and LDL 145. He was discharged on ASA and lipitor.   Of note, he has pituitary macroadenoma diagnosed with MRI in 06/2013 and his prolactin was super high and FSH and LH were super low. He was put on cabergolin. However, he has no follow up on this issue.   Interval History During the interval time, the patient has been doing better.  He has been followed up with PCP Dr. Karlton Lemon and cardiologist Dr. Einar Gip for HTN. His BP at home around 150-160. Today BP 148/74. He was recently put on norvasc and spironolactone by Dr. Einar Gip. Glucose check yesterday was 127. Still on CPAP for OSA. today, nurse come by 155 - 160. Still taking cabergoline, but not following with any provider for that. He lost some weight.   REVIEW OF SYSTEMS: Full 14 system review of systems performed and notable only for those listed below and in HPI above, all others are negative:  Constitutional: N/A  Cardiovascular: N/A  Ear/Nose/Throat: N/A  Skin: N/A  Eyes: N/A  Respiratory: N/A  Gastroitestinal: N/A  Genitourinary: frequent of urination Hematology/Lymphatic: N/A  Endocrine: N/A  Musculoskeletal: joint pain  Allergy/Immunology: N/A  Neurological: speech difficulty  Psychiatric:  N/A  The following represents the patient's updated allergies and side effects list: No Known Allergies  Labs since last visit of relevance include the following: Results for orders placed in visit on 02/16/14  GLUCOSE, CAPILLARY      Result Value Ref Range   Glucose-Capillary 205 (*) 70 - 99 mg/dL   Comment 1 Notify RN     Comment 2 Documented in Chart      The neurologically relevant items on the patient's problem list were reviewed on today's visit.  Neurologic Examination  A problem focused neurological exam (12 or more points of the single system neurologic examination, vital signs counts as 1 point, cranial nerves count for 8 points) was performed.  Blood pressure 148/74, pulse 74, height 5' 9.5" (1.765 m), weight 352 lb (159.666 kg).  General - morbid obesity, well developed, in no apparent distress.  Ophthalmologic - not able to see through.  Cardiovascular - Regular rate and rhythm with no murmur.  Mental Status -  Level of arousal and orientation to time, place, and person were intact. Language including expression, naming, repetition, comprehension was assessed and found intact.  Cranial Nerves II - XII - II - Visual field intact OU. III, IV, VI - Extraocular movements intact. V - Facial sensation intact bilaterally. VII - Facial movement intact bilaterally. VIII - Hearing & vestibular intact bilaterally. X - Palate elevates symmetrically. XI - Chin turning & shoulder shrug intact bilaterally. XII - Tongue protrusion intact.  Motor Strength - The patient's strength was normal in all extremities and pronator drift was absent.  Bulk was normal and fasciculations were absent.   Motor Tone - Muscle tone was assessed at the neck and appendages and was normal.  Reflexes - The patient's reflexes were 1+ in all extremities and he had no pathological reflexes.  Sensory - Light touch, temperature/pinprick were assessed and were normal.    Coordination - The patient  had normal movements in the hands and feet with no ataxia or dysmetria.  Tremor was absent.  Gait and Station - use cane for walking, steady but slow with small strides.  Data reviewed: I personally reviewed the images and agree with the radiology interpretations.  Ct Head Wo Contrast  01/13/2014  1. Increase conspicuity of small hypodensities in the left lentiform nucleus and right caudate head, suspicious for small subacute lacunar infarcts.  2. Periventricular white matter and corona radiata hypodensities favor chronic ischemic microvascular white matter disease.  3. 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  Mr Brain Ltd W/o Cm  01/14/2014  Acute ischemic infarct involving the right putamen with superior extension into the right corona radiata. No significant mass effect. Probable remote lacunar infarct noted within the pons.  Carotid Doppler - 1-39% ICA stenosis. Vertebral artery flow not insonated secondary to body habitus.  2D Echocardiogram ejection fraction of 40-45%. No cardiac source of emboli identified.  EKG - sinus rhythm rate 92 beats per minute.  A1C 9.9 and LDL 145  Assessment: As you may recall, he is a 63 y.o. 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 His stroke was felt to be thrombotic given the location and multiple stroke risk factors. Will continue ASA and liptor and follow up with PCP for risk factor modification. He also needs endocrinologist or neurosurgeon for follow up with pituitary adenoma.  Plan:  - continue ASA and lipitor for stroke prevention - follow up with PCP and cardiologist for stroke risk factor modification - pt will discuss with Dr. Karlton Lemon in the next visit later this month regarding endocrinologist or neurosurgeon follow up on pituitary adenoma - check BP and glucose at home - exercise and lose  weight - RTC in 6 months.  No orders of the defined types were placed in this encounter.    Patient Instructions  - continue ASA and lipitor for stroke prevention - follow up with Dr. Karlton Lemon for stroke risk factor modification - also mention to Dr. Karlton Lemon that you may need endocrinologist or neurosurgeon to follow up with your pituitary tumor. - follow up with Dr. Einar Gip for blood pressure and CHF management too - check BP and sugar at home and record - continue CPAP for OSA.  - regular exercise and lose weight - RTC in 6 months.   Rosalin Hawking, MD PhD [facility] [address], [place], [place] [address] [phone]

## 2014-03-30 DIAGNOSIS — I87331 Chronic venous hypertension (idiopathic) with ulcer and inflammation of right lower extremity: Secondary | ICD-10-CM | POA: Diagnosis not present

## 2014-03-30 DIAGNOSIS — L97919 Non-pressure chronic ulcer of unspecified part of right lower leg with unspecified severity: Secondary | ICD-10-CM | POA: Diagnosis not present

## 2014-04-06 DIAGNOSIS — I87331 Chronic venous hypertension (idiopathic) with ulcer and inflammation of right lower extremity: Secondary | ICD-10-CM | POA: Diagnosis not present

## 2014-04-11 ENCOUNTER — Encounter: Payer: Self-pay | Admitting: Pulmonary Disease

## 2014-04-11 ENCOUNTER — Ambulatory Visit (INDEPENDENT_AMBULATORY_CARE_PROVIDER_SITE_OTHER): Payer: BC Managed Care – PPO | Admitting: Pulmonary Disease

## 2014-04-11 VITALS — BP 134/72 | HR 84 | Temp 97.0°F | Ht 69.5 in | Wt 355.8 lb

## 2014-04-11 DIAGNOSIS — G4733 Obstructive sleep apnea (adult) (pediatric): Secondary | ICD-10-CM

## 2014-04-11 NOTE — Progress Notes (Signed)
   Subjective:    Patient ID: Blake Bell, male    DOB: 1951-06-08, 63 y.o.   MRN: [id number]  HPI The patient comes in today for follow-up of his very severe obstructive sleep apnea. Unfortunately, compliance continues to be an issue for him, and this time around is related to a malfunctioning hose connection to his device and mask. At least he feels his mask fits well, and uses a chin strap to prevent oral venting. His download shows very poor compliance.   Review of Systems  Constitutional: Negative for fever and unexpected weight change.  HENT: Negative for congestion, dental problem, ear pain, nosebleeds, postnasal drip, rhinorrhea, sinus pressure, sneezing, sore throat and trouble swallowing.   Eyes: Negative for redness and itching.  Respiratory: Positive for shortness of breath. Negative for cough, chest tightness and wheezing.   Cardiovascular: Positive for leg swelling. Negative for palpitations.  Gastrointestinal: Negative for nausea and vomiting.  Genitourinary: Negative for dysuria.  Musculoskeletal: Negative for joint swelling.  Skin: Negative for rash.  Neurological: Negative for headaches.  Hematological: Does not bruise/bleed easily.  Psychiatric/Behavioral: Negative for dysphoric mood. The patient is not nervous/anxious.        Objective:   Physical Exam Morbidly obese male in no acute distress Nose without purulence or discharge noted Neck without lymphadenopathy or thyromegaly No skin breakdown or pressure necrosis from the C Pap mask Lower extremities with edema noted, no cyanosis Alert and oriented, moves all 4 extremities.       Assessment & Plan:

## 2014-04-11 NOTE — Assessment & Plan Note (Signed)
The patient continues to have compliance problems with his C-peptide device, and currently this is related to a hose issue. He really does not understand that he has to approach his homecare company to help him with C Pap mechanical issues. I also think he has a lack of insight into the complications of sleep apnea if left untreated. I have reminded him of the severity of his sleep apnea, and that it is life-threatening if left untreated. I also discussed with him the possibility of a tracheostomy with mechanical ventilation during the night. He would like to keep trying to work with CPAP, and I will get him back to his homecare company to work with him on his equipment. I also stressed to him the importance of aggressive weight loss.

## 2014-04-11 NOTE — Patient Instructions (Signed)
Will get you back to your homecare company to work with you on hose issues. You must wear cpap everynight give the severity of your sleep apnea.  It is life threatening. Work on weight loss followup with me again in 18mos

## 2014-04-13 DIAGNOSIS — I87331 Chronic venous hypertension (idiopathic) with ulcer and inflammation of right lower extremity: Secondary | ICD-10-CM | POA: Diagnosis not present

## 2014-04-13 DIAGNOSIS — L97919 Non-pressure chronic ulcer of unspecified part of right lower leg with unspecified severity: Secondary | ICD-10-CM | POA: Diagnosis not present

## 2014-04-20 ENCOUNTER — Encounter (HOSPITAL_BASED_OUTPATIENT_CLINIC_OR_DEPARTMENT_OTHER): Payer: BC Managed Care – PPO | Attending: General Surgery

## 2014-04-20 DIAGNOSIS — I87331 Chronic venous hypertension (idiopathic) with ulcer and inflammation of right lower extremity: Secondary | ICD-10-CM | POA: Insufficient documentation

## 2014-04-20 DIAGNOSIS — L97919 Non-pressure chronic ulcer of unspecified part of right lower leg with unspecified severity: Secondary | ICD-10-CM | POA: Insufficient documentation

## 2014-04-27 DIAGNOSIS — L97919 Non-pressure chronic ulcer of unspecified part of right lower leg with unspecified severity: Secondary | ICD-10-CM | POA: Diagnosis not present

## 2014-04-27 DIAGNOSIS — I87331 Chronic venous hypertension (idiopathic) with ulcer and inflammation of right lower extremity: Secondary | ICD-10-CM | POA: Diagnosis not present

## 2014-05-04 DIAGNOSIS — L97919 Non-pressure chronic ulcer of unspecified part of right lower leg with unspecified severity: Secondary | ICD-10-CM | POA: Diagnosis not present

## 2014-05-04 DIAGNOSIS — I87331 Chronic venous hypertension (idiopathic) with ulcer and inflammation of right lower extremity: Secondary | ICD-10-CM | POA: Diagnosis not present

## 2014-05-11 DIAGNOSIS — I87331 Chronic venous hypertension (idiopathic) with ulcer and inflammation of right lower extremity: Secondary | ICD-10-CM | POA: Diagnosis not present

## 2014-05-11 DIAGNOSIS — L97919 Non-pressure chronic ulcer of unspecified part of right lower leg with unspecified severity: Secondary | ICD-10-CM | POA: Diagnosis not present

## 2014-08-06 ENCOUNTER — Ambulatory Visit (INDEPENDENT_AMBULATORY_CARE_PROVIDER_SITE_OTHER): Payer: 59 | Admitting: Emergency Medicine

## 2014-08-06 VITALS — BP 138/88 | HR 80 | Temp 97.8°F | Resp 20 | Ht 66.0 in | Wt 351.4 lb

## 2014-08-06 DIAGNOSIS — I10 Essential (primary) hypertension: Secondary | ICD-10-CM

## 2014-08-06 DIAGNOSIS — G4733 Obstructive sleep apnea (adult) (pediatric): Secondary | ICD-10-CM

## 2014-08-06 DIAGNOSIS — E1122 Type 2 diabetes mellitus with diabetic chronic kidney disease: Secondary | ICD-10-CM

## 2014-08-06 DIAGNOSIS — N189 Chronic kidney disease, unspecified: Secondary | ICD-10-CM

## 2014-08-06 MED ORDER — LOSARTAN POTASSIUM 50 MG PO TABS: 50.0000 mg | ORAL_TABLET | Freq: Every day | ORAL | Status: DC

## 2014-08-06 NOTE — Progress Notes (Signed)
[facility] [address] [place] [address] [phone]  Date:  08/06/2014   Name:  Blake Knight Sr.   DOB:  May 10, 1951   MRN:  [id number]  PCP:  Roselee Culver, MD    Chief Complaint: Establish Care   History of Present Illness:  Blake Ina Sr. is a 64 y.o. very pleasant male patient who presents with the following:  Comes after referral from [facility] for continued care. Concerned about the price of his ARB. Needs refills on lantus and a referral to his cardiologist, Dr. Nadyne Coombes Denies other complaint or health concern today.   Patient Active Problem List   Diagnosis Date Noted  . Stroke 03/24/2014  . Type 2 diabetes mellitus with complication 40/97/3532  . Essential hypertension, benign 03/24/2014  . HLD (hyperlipidemia) 03/24/2014  . CVA (cerebral infarction) 01/13/2014  . AKI (acute kidney injury) 07/03/2013  . Hypernatremia 07/03/2013  . Morbid obesity 07/03/2013  . Venous stasis ulcers 07/03/2013  . DM (diabetes mellitus) type 2, uncontrolled, with ketoacidosis 07/03/2013  . 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 Sellar or suprasellar mass 06/22/2013  . CKD (chronic kidney disease) stage 3, GFR 30-59 ml/min 06/22/2013  . Acute respiratory failure 06/22/2013  . Seizure 06/21/2013  . Altered mental status 06/21/2013  . DKA (diabetic ketoacidoses) 06/21/2013  . Hypercarbia 06/21/2013  . OSA (obstructive sleep apnea) 05/25/2013    Past Medical History  Diagnosis Date  . Hypertension   . Diabetes mellitus without complication   . Sleep apnea     uses cpap, setting of 5  . Chronic kidney disease     ssees dr Florene Glen nephrology every 6-9 months  . Multiple wounds     on lower legs - sees [name] Pain at the [facility]    Past Surgical History  Procedure Laterality Date  . Colonscopy  6-7 yrs ago  . Colonoscopy N/A 04/05/2013    Procedure: COLONOSCOPY;  Surgeon: Juanita Craver, MD;  Location: [facility];  Service: Endoscopy;   Laterality: N/A;    History  Substance Use Topics  . Smoking status: Former Smoker -- 0.25 packs/day for 2 years    Types: Cigarettes    Quit date: 06/17/1970  . Smokeless tobacco: Never Used  . Alcohol Use: No    Family History  Problem Relation Age of Onset  . Diabetes Mother   . Hypertension Mother   . Diabetes Father   . Hypertension Father   . Diabetes Sister   . Diabetes Brother     No Known Allergies  Medication list has been reviewed and updated.  Current Outpatient Prescriptions on File Prior to Visit  Medication Sig Dispense Refill  . Albiglutide (TANZEUM) 30 MG PEN Inject 30 mg into the skin once a week.    Marland Kitchen aspirin 325 MG tablet Take 1 tablet (325 mg total) by mouth daily.    Marland Kitchen atorvastatin (LIPITOR) 80 MG tablet Take 1 tablet (80 mg total) by mouth daily at 6 PM. 30 tablet 0  . Azilsartan Medoxomil (EDARBI) 40 MG TABS Take 1 tablet by mouth daily.    Marland Kitchen BAYER CONTOUR TEST test strip     . cabergoline (DOSTINEX) 0.5 MG tablet Take 0.5 tablets (0.25 mg total) by mouth 2 (two) times a week. 10 tablet 0  . furosemide (LASIX) 80 MG tablet Take 80 mg by mouth daily.     . insulin glargine (LANTUS) 100 UNIT/ML injection Inject 0.3 mLs (30 Units total) into the skin daily.  10 mL 11  . isosorbide-hydrALAZINE (BIDIL) 20-37.5 MG per tablet Take 2 tablets by mouth 3 (three) times daily.    Marland Kitchen labetalol (NORMODYNE) 200 MG tablet Take 200 mg by mouth 3 (three) times daily.    Marland Kitchen spironolactone (ALDACTONE) 25 MG tablet Take 25 mg by mouth daily.    Marland Kitchen amLODipine (NORVASC) 10 MG tablet Take 10 tablets by mouth daily.     No current facility-administered medications on file prior to visit.    Review of Systems:  As per HPI, otherwise negative.    Physical Examination: Filed Vitals:   08/06/14 0823  BP: 138/88  Pulse: 80  Temp: 97.8 F (36.6 C)  Resp: 20   Filed Vitals:   08/06/14 0823  Height: 5\' 6"  (1.676 m)  Weight: 351 lb 6.4 oz (159.394 kg)   Body mass  index is 56.74 kg/(m^2). Ideal Body Weight: Weight in (lb) to have BMI = 25: 154.6  GEN: morbid obesity, NAD, Non-toxic, A & O x 3 HEENT: Atraumatic, Normocephalic. Neck supple. No masses, No LAD. Ears and Nose: No external deformity. CV: RRR, No M/G/R. No JVD. No thrill. No extra heart sounds. PULM: CTA B, no wheezes, crackles, rhonchi. No retractions. No resp. distress. No accessory muscle use. ABD: S, NT, ND, +BS. No rebound. No HSM. EXTR: No c/c/e NEURO Normal gait.  PSYCH: Normally interactive. Conversant. Not depressed or anxious appearing.  Calm demeanor.    Assessment and Plan: HBP OSA CKD HLD CVA Morbid obesity  Signed,  Ellison Carwin, MD

## 2014-08-06 NOTE — Patient Instructions (Signed)

## 2014-08-12 NOTE — Progress Notes (Signed)
Left message for patient to call back to schedule appointment with a MD.

## 2014-09-23 ENCOUNTER — Ambulatory Visit: Payer: BC Managed Care – PPO | Admitting: Neurology

## 2014-10-10 ENCOUNTER — Ambulatory Visit: Payer: BC Managed Care – PPO | Admitting: Pulmonary Disease

## 2014-10-20 ENCOUNTER — Telehealth: Payer: Self-pay

## 2014-10-20 DIAGNOSIS — E08621 Diabetes mellitus due to underlying condition with foot ulcer: Secondary | ICD-10-CM

## 2014-10-20 DIAGNOSIS — L97509 Non-pressure chronic ulcer of other part of unspecified foot with unspecified severity: Principal | ICD-10-CM

## 2014-10-20 NOTE — Telephone Encounter (Signed)
Pt needs referral for diabetic foot care, confirmed with pt.

## 2014-10-20 NOTE — Telephone Encounter (Signed)
Please advise on referral. Last seen by dr [name] 08/06/14.

## 2014-10-20 NOTE — Telephone Encounter (Signed)
Please clarify the reason for this referral. Diabetic foot care?

## 2014-10-20 NOTE — Telephone Encounter (Signed)
Pt is needing a referral to dr Amalia Hailey at [facility]

## 2014-10-21 NOTE — Telephone Encounter (Signed)
Referral placed. lmom to inform pt.

## 2014-11-21 ENCOUNTER — Ambulatory Visit: Payer: 59 | Admitting: Podiatry

## 2014-11-22 ENCOUNTER — Ambulatory Visit: Payer: 59 | Admitting: Podiatry

## 2014-11-30 ENCOUNTER — Ambulatory Visit: Payer: BC Managed Care – PPO | Admitting: Pulmonary Disease

## 2014-12-02 ENCOUNTER — Emergency Department (HOSPITAL_COMMUNITY): Payer: 59

## 2014-12-02 ENCOUNTER — Encounter (HOSPITAL_COMMUNITY): Payer: Self-pay | Admitting: *Deleted

## 2014-12-02 ENCOUNTER — Inpatient Hospital Stay (HOSPITAL_COMMUNITY)
Admission: EM | Admit: 2014-12-02 | Discharge: 2014-12-06 | DRG: 065 | Disposition: A | Discharge status: Skilled Nursing Facility | Payer: 59 | Attending: Internal Medicine | Admitting: Internal Medicine

## 2014-12-02 DIAGNOSIS — N183 Chronic kidney disease, stage 3 unspecified: Secondary | ICD-10-CM

## 2014-12-02 DIAGNOSIS — Z79899 Other long term (current) drug therapy: Secondary | ICD-10-CM | POA: Diagnosis not present

## 2014-12-02 DIAGNOSIS — G9389 Other specified disorders of brain: Secondary | ICD-10-CM

## 2014-12-02 DIAGNOSIS — Z7982 Long term (current) use of aspirin: Secondary | ICD-10-CM | POA: Diagnosis not present

## 2014-12-02 DIAGNOSIS — Z22322 Carrier or suspected carrier of Methicillin resistant Staphylococcus aureus: Secondary | ICD-10-CM

## 2014-12-02 DIAGNOSIS — I63512 Cerebral infarction due to unspecified occlusion or stenosis of left middle cerebral artery: Secondary | ICD-10-CM | POA: Diagnosis present

## 2014-12-02 DIAGNOSIS — G4733 Obstructive sleep apnea (adult) (pediatric): Secondary | ICD-10-CM | POA: Diagnosis not present

## 2014-12-02 DIAGNOSIS — E785 Hyperlipidemia, unspecified: Secondary | ICD-10-CM | POA: Diagnosis present

## 2014-12-02 DIAGNOSIS — Z8249 Family history of ischemic heart disease and other diseases of the circulatory system: Secondary | ICD-10-CM

## 2014-12-02 DIAGNOSIS — I10 Essential (primary) hypertension: Secondary | ICD-10-CM

## 2014-12-02 DIAGNOSIS — E1122 Type 2 diabetes mellitus with diabetic chronic kidney disease: Secondary | ICD-10-CM | POA: Diagnosis present

## 2014-12-02 DIAGNOSIS — R471 Dysarthria and anarthria: Secondary | ICD-10-CM | POA: Diagnosis present

## 2014-12-02 DIAGNOSIS — Z87891 Personal history of nicotine dependence: Secondary | ICD-10-CM

## 2014-12-02 DIAGNOSIS — I639 Cerebral infarction, unspecified: Secondary | ICD-10-CM | POA: Diagnosis present

## 2014-12-02 DIAGNOSIS — E1165 Type 2 diabetes mellitus with hyperglycemia: Secondary | ICD-10-CM | POA: Diagnosis present

## 2014-12-02 DIAGNOSIS — Z6841 Body Mass Index (BMI) 40.0 and over, adult: Secondary | ICD-10-CM | POA: Diagnosis not present

## 2014-12-02 DIAGNOSIS — E1110 Type 2 diabetes mellitus with ketoacidosis without coma: Secondary | ICD-10-CM

## 2014-12-02 DIAGNOSIS — I129 Hypertensive chronic kidney disease with stage 1 through stage 4 chronic kidney disease, or unspecified chronic kidney disease: Secondary | ICD-10-CM | POA: Diagnosis present

## 2014-12-02 DIAGNOSIS — R569 Unspecified convulsions: Secondary | ICD-10-CM

## 2014-12-02 DIAGNOSIS — E118 Type 2 diabetes mellitus with unspecified complications: Secondary | ICD-10-CM | POA: Diagnosis not present

## 2014-12-02 DIAGNOSIS — I739 Peripheral vascular disease, unspecified: Secondary | ICD-10-CM | POA: Diagnosis present

## 2014-12-02 DIAGNOSIS — Z9114 Patient's other noncompliance with medication regimen: Secondary | ICD-10-CM | POA: Diagnosis present

## 2014-12-02 DIAGNOSIS — N179 Acute kidney failure, unspecified: Secondary | ICD-10-CM | POA: Diagnosis present

## 2014-12-02 DIAGNOSIS — R0689 Other abnormalities of breathing: Secondary | ICD-10-CM

## 2014-12-02 DIAGNOSIS — R220 Localized swelling, mass and lump, head: Secondary | ICD-10-CM

## 2014-12-02 DIAGNOSIS — E870 Hyperosmolality and hypernatremia: Secondary | ICD-10-CM

## 2014-12-02 DIAGNOSIS — Z794 Long term (current) use of insulin: Secondary | ICD-10-CM | POA: Diagnosis not present

## 2014-12-02 DIAGNOSIS — I6350 Cerebral infarction due to unspecified occlusion or stenosis of unspecified cerebral artery: Secondary | ICD-10-CM | POA: Diagnosis not present

## 2014-12-02 HISTORY — DX: Benign neoplasm of pituitary gland: D35.2

## 2014-12-02 HISTORY — DX: Type 2 diabetes mellitus without complications: E11.9

## 2014-12-02 HISTORY — DX: Cerebral infarction, unspecified: I63.9

## 2014-12-02 HISTORY — DX: Dependence on other enabling machines and devices: Z99.89

## 2014-12-02 HISTORY — DX: Pure hypercholesterolemia, unspecified: E78.00

## 2014-12-02 HISTORY — DX: Obstructive sleep apnea (adult) (pediatric): G47.33

## 2014-12-02 LAB — BASIC METABOLIC PANEL
ANION GAP: 8 (ref 5–15)
BUN: 27 mg/dL — ABNORMAL HIGH (ref 6–20)
CHLORIDE: 107 mmol/L (ref 101–111)
CO2: 24 mmol/L (ref 22–32)
CREATININE: 3.16 mg/dL — AB (ref 0.61–1.24)
Calcium: 8.7 mg/dL — ABNORMAL LOW (ref 8.9–10.3)
GFR, EST AFRICAN AMERICAN: 23 mL/min — AB (ref >60)
GFR, EST NON AFRICAN AMERICAN: 19 mL/min — AB (ref >60)
Glucose, Bld: 177 mg/dL — ABNORMAL HIGH (ref 65–99)
POTASSIUM: 3.9 mmol/L (ref 3.5–5.1)
Sodium: 139 mmol/L (ref 135–145)

## 2014-12-02 LAB — CBC WITH DIFFERENTIAL/PLATELET
BASOS ABS: 0.1 10*3/uL (ref 0.0–0.1)
Basophils Relative: 1 % (ref 0–1)
Eosinophils Absolute: 0.1 10*3/uL (ref 0.0–0.7)
Eosinophils Relative: 1 % (ref 0–5)
HCT: 36.2 % — ABNORMAL LOW (ref 39.0–52.0)
Hemoglobin: 11.7 g/dL — ABNORMAL LOW (ref 13.0–17.0)
LYMPHS PCT: 10 % — AB (ref 12–46)
Lymphs Abs: 1 10*3/uL (ref 0.7–4.0)
MCH: 28.3 pg (ref 26.0–34.0)
MCHC: 32.3 g/dL (ref 30.0–36.0)
MCV: 87.4 fL (ref 78.0–100.0)
Monocytes Absolute: 0.4 10*3/uL (ref 0.1–1.0)
Monocytes Relative: 4 % (ref 3–12)
NEUTROS PCT: 84 % — AB (ref 43–77)
Neutro Abs: 8.3 10*3/uL — ABNORMAL HIGH (ref 1.7–7.7)
PLATELETS: 354 10*3/uL (ref 150–400)
RBC: 4.14 MIL/uL — ABNORMAL LOW (ref 4.22–5.81)
RDW: 18 % — AB (ref 11.5–15.5)
WBC: 9.9 10*3/uL (ref 4.0–10.5)

## 2014-12-02 LAB — GLUCOSE, CAPILLARY: Glucose-Capillary: 130 mg/dL — ABNORMAL HIGH (ref 65–99)

## 2014-12-02 LAB — TROPONIN I: Troponin I: <0.03 ng/mL (ref <0.031)

## 2014-12-02 LAB — CBG MONITORING, ED: GLUCOSE-CAPILLARY: 136 mg/dL — AB (ref 65–99)

## 2014-12-02 MED ORDER — ISOSORB DINITRATE-HYDRALAZINE 20-37.5 MG PO TABS
2.0000 | ORAL_TABLET | Freq: Three times a day (TID) | ORAL | Status: DC
  Administered 2014-12-03 – 2014-12-06 (×9): 2 via ORAL
  Filled 2014-12-02 (×14): qty 2

## 2014-12-02 MED ORDER — CLOPIDOGREL BISULFATE 75 MG PO TABS
75.0000 mg | ORAL_TABLET | Freq: Every day | ORAL | Status: DC
  Administered 2014-12-03 – 2014-12-06 (×4): 75 mg via ORAL
  Filled 2014-12-02 (×4): qty 1

## 2014-12-02 MED ORDER — CABERGOLINE 0.5 MG PO TABS: 0.2500 mg | ORAL_TABLET | ORAL | Status: DC

## 2014-12-02 MED ORDER — INSULIN ASPART 100 UNIT/ML ~~LOC~~ SOLN
0.0000 [IU] | SUBCUTANEOUS | Status: DC
  Administered 2014-12-02 – 2014-12-03 (×3): 2 [IU] via SUBCUTANEOUS

## 2014-12-02 MED ORDER — SODIUM CHLORIDE 0.9 % IV SOLN
INTRAVENOUS | Status: DC
  Administered 2014-12-02 – 2014-12-03 (×2): via INTRAVENOUS

## 2014-12-02 MED ORDER — LABETALOL HCL 5 MG/ML IV SOLN
5.0000 mg | Freq: Four times a day (QID) | INTRAVENOUS | Status: DC | PRN
  Administered 2014-12-02 – 2014-12-03 (×2): 5 mg via INTRAVENOUS
  Filled 2014-12-02 (×3): qty 4

## 2014-12-02 MED ORDER — ATORVASTATIN CALCIUM 80 MG PO TABS
80.0000 mg | ORAL_TABLET | Freq: Every day | ORAL | Status: DC
  Administered 2014-12-03 – 2014-12-05 (×3): 80 mg via ORAL
  Filled 2014-12-02 (×3): qty 1

## 2014-12-02 MED ORDER — SENNOSIDES-DOCUSATE SODIUM 8.6-50 MG PO TABS: 1.0000 | ORAL_TABLET | Freq: Every evening | ORAL | Status: DC | PRN

## 2014-12-02 MED ORDER — CABERGOLINE 0.5 MG PO TABS
0.2500 mg | ORAL_TABLET | ORAL | Status: DC
  Filled 2014-12-02: qty 1

## 2014-12-02 MED ORDER — STROKE: EARLY STAGES OF RECOVERY BOOK
Freq: Once | Status: AC
  Administered 2014-12-02: 18:00:00

## 2014-12-02 MED ORDER — LABETALOL HCL 200 MG PO TABS
200.0000 mg | ORAL_TABLET | Freq: Three times a day (TID) | ORAL | Status: DC
  Administered 2014-12-03 – 2014-12-06 (×9): 200 mg via ORAL
  Filled 2014-12-02 (×10): qty 1

## 2014-12-02 MED ORDER — HEPARIN SODIUM (PORCINE) 5000 UNIT/ML IJ SOLN
5000.0000 [IU] | Freq: Three times a day (TID) | INTRAMUSCULAR | Status: DC
  Administered 2014-12-02 – 2014-12-06 (×12): 5000 [IU] via SUBCUTANEOUS
  Filled 2014-12-02 (×13): qty 1

## 2014-12-02 MED ORDER — ESCITALOPRAM OXALATE 10 MG PO TABS
40.0000 mg | ORAL_TABLET | Freq: Every day | ORAL | Status: DC
  Administered 2014-12-03 – 2014-12-06 (×4): 40 mg via ORAL
  Filled 2014-12-02 (×4): qty 4

## 2014-12-02 NOTE — ED Notes (Signed)
MD at bedside. 

## 2014-12-02 NOTE — Consult Note (Signed)
Referring Physician: Dr Jeneen Rinks    Chief Complaint:   HPI: Blake Rawlins Sr. is a 64 y.o. male with a history of hypertension, chronic kidney disease, diabetes mellitus, obstructive sleep apnea, and previous stroke, who presented to the emergency department today for further evaluation as speech difficulties.  An MRI from July 2015 revealed an acute ischemic infarct involving the right putamen with superior extension into the right corona radiata.  The patient's wife reported that she phoned the patient at approximately 9 PM last night before returning home from work. She noticed that the patient was having significant word finding difficulties. The deficits persisted today and he was brought to the emergency department for further evaluation. An MRI is currently pending. The patient and his wife deny any recent speech difficulties until last night. He has experienced congested cough recently and his wife felt he was having some difficulty with his swallowing. His speech difficulties have improved since arrival to the emergency department today. The patient takes an aspirin daily and denies any recent missed doses.   Date last known well: Date: 12/01/2014 Time last known well: Unable to determine tPA Given: No: Late presentation and unknown time of onset.  Past Medical History  Diagnosis Date  . Hypertension   . Diabetes mellitus without complication   . Sleep apnea     uses cpap, setting of 5  . Chronic kidney disease     ssees dr Florene Glen nephrology every 6-9 months  . Multiple wounds     on lower legs - sees [name] Pain at the [facility]    Past Surgical History  Procedure Laterality Date  . Colonscopy  6-7 yrs ago  . Colonoscopy N/A 04/05/2013    Procedure: COLONOSCOPY;  Surgeon: Juanita Craver, MD;  Location: [facility];  Service: Endoscopy;  Laterality: N/A;    Family History  Problem Relation Age of Onset  . Diabetes Mother   . Hypertension Mother   . Diabetes Father   .  Hypertension Father   . Diabetes Sister   . Diabetes Brother    Social History:  reports that he quit smoking about 44 years ago. His smoking use included Cigarettes. He has a .5 pack-year smoking history. He has never used smokeless tobacco. He reports that he does not drink alcohol or use illicit drugs.  Allergies: No Known Allergies  Medications:  No current facility-administered medications for this encounter.   Current Outpatient Prescriptions  Medication Sig Dispense Refill  . Albiglutide (TANZEUM) 30 MG PEN Inject 30 mg into the skin once a week.    Marland Kitchen amLODipine (NORVASC) 10 MG tablet Take 10 tablets by mouth daily.    Marland Kitchen aspirin 325 MG tablet Take 1 tablet (325 mg total) by mouth daily.    Marland Kitchen atorvastatin (LIPITOR) 80 MG tablet Take 1 tablet (80 mg total) by mouth daily at 6 PM. 30 tablet 0  . Azilsartan Medoxomil (EDARBI) 40 MG TABS Take 1 tablet by mouth daily.    . cabergoline (DOSTINEX) 0.5 MG tablet Take 0.5 tablets (0.25 mg total) by mouth 2 (two) times a week. 10 tablet 0  . escitalopram (LEXAPRO) 20 MG tablet Take 40 mg by mouth daily.    . furosemide (LASIX) 80 MG tablet Take 80 mg by mouth daily.     . insulin glargine (LANTUS) 100 UNIT/ML injection Inject 0.3 mLs (30 Units total) into the skin daily. 10 mL 11  . isosorbide-hydrALAZINE (BIDIL) 20-37.5 MG per tablet Take 2 tablets by mouth 3 (  three) times daily.    Marland Kitchen labetalol (NORMODYNE) 200 MG tablet Take 200 mg by mouth 3 (three) times daily.    Marland Kitchen losartan (COZAAR) 50 MG tablet Take 1 tablet (50 mg total) by mouth daily. (Patient not taking: Reported on 12/02/2014) 90 tablet 3     ROS: History obtained from the patient and his wife.  General ROS: negative for - chills, fatigue, fever, night sweats, weight gain or weight loss Psychological ROS: negative for - behavioral disorder, hallucinations, memory difficulties, mood swings or suicidal ideation Ophthalmic ROS: negative for - blurry vision, double vision, eye pain  or loss of vision ENT ROS: negative for - epistaxis, nasal discharge, oral lesions, sore throat, tinnitus or vertigo Allergy and Immunology ROS: negative for - hives or itchy/watery eyes Hematological and Lymphatic ROS: negative for - bleeding problems, bruising or swollen lymph nodes Endocrine ROS: negative for - galactorrhea, hair pattern changes, polydipsia/polyuria or temperature intolerance Respiratory ROS: negative for - hemoptysis, shortness of breath or wheezing. Positive for a recent congested cough. Cardiovascular ROS: negative for - chest pain, dyspnea on exertion, or irregular heartbeat. Positive bilateral lower extremity edema. Gastrointestinal ROS: negative for - abdominal pain, diarrhea, hematemesis, nausea/vomiting or stool incontinence Genito-Urinary ROS: negative for - dysuria, hematuria, incontinence or urinary frequency/urgency Musculoskeletal ROS: negative for - joint swelling or muscular weakness Neurological ROS: as noted in HPI Dermatological ROS: negative for rash and skin lesion changes   Physical Examination: Blood pressure 125/70, pulse 70, temperature 97.9 F (36.6 C), temperature source Oral, resp. rate 16, height 5' 9.5" (1.765 m), SpO2 94 %.  General - large 64 year old male in no acute distress. Heart - Regular rate and rhythm with distant heart sounds. Lungs - Clear to auscultation Abdomen - Soft - non tender Extremities - Distal pulses weak to absent.  2 - 3+ pitting edema bilaterally Skin - Warm and dry  Mental Status: Alert but somewhat slow to respond.  Speech difficulties with aphasia.  Able to follow 3 step commands without difficulty. Cranial Nerves: II: Discs not visualized; Visual fields grossly normal, pupils equal, round, reactive to light and accommodation III,IV, VI: ptosis not present, extra-ocular motions intact bilaterally V,VII: Mild right facial weakness VIII: hearing normal bilaterally IX,X: gag reflex present XI: bilateral  shoulder shrug XII: midline tongue extension Motor: Right : Upper extremity   5/5    Left:     Upper extremity   5/5  Lower extremity   5/5     Lower extremity   5/5 Tone and bulk:normal tone throughout; no atrophy noted Sensory: Pinprick and light touch intact throughout, bilaterally Deep Tendon Reflexes: 2+ and symmetric throughout Plantars: Right: downgoing   Left: downgoing Cerebellar: normal finger-to-nose, normal rapid alternating movements and normal heel-to-shin test Gait: normal gait and station CV: pulses palpable throughout    Laboratory Studies:  Basic Metabolic Panel:  Recent Labs Lab 12/02/14 1036  NA 139  K 3.9  CL 107  CO2 24  GLUCOSE 177*  BUN 27*  CREATININE 3.16*  CALCIUM 8.7*    Liver Function Tests: No results for input(s): AST, ALT, ALKPHOS, BILITOT, PROT, ALBUMIN in the last 168 hours. No results for input(s): LIPASE, AMYLASE in the last 168 hours. No results for input(s): AMMONIA in the last 168 hours.  CBC:  Recent Labs Lab 12/02/14 1036  WBC 9.9  NEUTROABS 8.3*  HGB 11.7*  HCT 36.2*  MCV 87.4  PLT 354    Cardiac Enzymes:  Recent Labs Lab 12/02/14 1036  TROPONINI <0.03    BNP: Invalid input(s): POCBNP  CBG:  Recent Labs Lab 12/02/14 1409  GLUCAP 136*    Microbiology: Results for orders placed or performed during the hospital encounter of 01/13/14  MRSA PCR Screening     Status: None   Collection Time: 01/14/14 11:18 AM  Result Value Ref Range Status   MRSA by PCR NEGATIVE NEGATIVE Final    Comment:        The GeneXpert MRSA Assay (FDA approved for NASAL specimens only), is one component of a comprehensive MRSA colonization surveillance program. It is not intended to diagnose MRSA infection nor to guide or monitor treatment for MRSA infections.    Coagulation Studies: No results for input(s): LABPROT, INR in the last 72 hours.  Urinalysis: No results for input(s): COLORURINE, LABSPEC, PHURINE, GLUCOSEU,  HGBUR, BILIRUBINUR, KETONESUR, PROTEINUR, UROBILINOGEN, NITRITE, LEUKOCYTESUR in the last 168 hours.  Invalid input(s): APPERANCEUR  Lipid Panel:    Component Value Date/Time   CHOL 238* 01/14/2014 0548   TRIG 143 01/14/2014 0548   HDL 64 01/14/2014 0548   CHOLHDL 3.7 01/14/2014 0548   VLDL 29 01/14/2014 0548   LDLCALC 145* 01/14/2014 0548    HgbA1C:  Lab Results  Component Value Date   HGBA1C 9.9* 01/14/2014    Urine Drug Screen:     Component Value Date/Time   LABOPIA NONE DETECTED 01/13/2014 2036   COCAINSCRNUR NONE DETECTED 01/13/2014 2036   LABBENZ NONE DETECTED 01/13/2014 2036   AMPHETMU NONE DETECTED 01/13/2014 2036   THCU NONE DETECTED 01/13/2014 2036   LABBARB NONE DETECTED 01/13/2014 2036    Alcohol Level: No results for input(s): ETH in the last 168 hours.  Other results: EKG: Sinus rhythm rate 74 bpm  Imaging: No results found.   MRI - pending  Assessment: 64 y.o. male with history of a previous stroke, hypertension, chronic kidney disease, diabetes mellitus, and obstructive sleep apnea who had the onset of aphasia last evening. Deficits persisted into today. The patient was brought to the emergency department for further evaluation. His wife thinks his speech is somewhat improved. An MRI is pending.   Stroke Risk Factors - diabetes mellitus, hypertension and Obstructive sleep apnea, Obesity with history of a previous stroke  Plan: 1. HgbA1c, fasting lipid panel 2. MRI, MRA  of the brain without contrast 3. PT consult, OT consult, Speech consult 4. Echocardiogram 5. Carotid dopplers 6. Prophylactic therapy-Antiplatelet med: Plavix - dose 75 mg daily 7. NPO until RN stroke swallow screen 8. Telemetry monitoring 9. Frequent neuro checks   Lowry Ram [facility] Pager [phone] 12/02/2014, 2:48 PM  I have seen and evaluated the patient. I have reviewed the above note and made appropriate changes.   MRI reviewed in the  does show left caudate stroke. I suspect this is due to his multiple small vessel risk factors.  I would favor admission for secondary risk factor modification.  Roland Rack, MD [facility] [phone]  If 7pm- 7am, please page neurology on call as listed in [place].

## 2014-12-02 NOTE — ED Provider Notes (Signed)
CSN: [id number]     Arrival date & time 12/02/14  1006 History   First MD Initiated Contact with Patient 12/02/14 1011     Chief Complaint  Patient presents with  . Aphasia      HPI  Patient presents for evaluation via EMS from his home. Wife and daughter accompany him here. Wife, more so than the patient, state that last night he was having trouble getting words out. She states that when she called him from work he was having difficulty. However, upon arrival home he seemed "fine".  He states that this morning he felt "funny". Wife was concerned that his sugar was low initially they tell me that they did not check her sugar and he did not eat or drink anything. However, later they state that he had "a peppermint or 2" and then felt better.  On arrival he feels he is at his baseline. He is speaking fluently. Per his wife he seems "normal again".  History of diabetes. He has not eaten today is the peppermint. History of stroke with aphasia a few years ago.  Past Medical History  Diagnosis Date  . Hypertension   . Diabetes mellitus without complication   . Sleep apnea     uses cpap, setting of 5  . Chronic kidney disease     ssees dr Florene Glen nephrology every 6-9 months  . Multiple wounds     on lower legs - sees [name] Pain at the [facility]   Past Surgical History  Procedure Laterality Date  . Colonscopy  6-7 yrs ago  . Colonoscopy N/A 04/05/2013    Procedure: COLONOSCOPY;  Surgeon: Juanita Craver, MD;  Location: [facility];  Service: Endoscopy;  Laterality: N/A;   Family History  Problem Relation Age of Onset  . Diabetes Mother   . Hypertension Mother   . Diabetes Father   . Hypertension Father   . Diabetes Sister   . Diabetes Brother    History  Substance Use Topics  . Smoking status: Former Smoker -- 0.25 packs/day for 2 years    Types: Cigarettes    Quit date: 06/17/1970  . Smokeless tobacco: Never Used  . Alcohol Use: No    Review of Systems  Constitutional:  Negative for fever, chills, diaphoresis, appetite change and fatigue.  HENT: Negative for mouth sores, sore throat and trouble swallowing.   Eyes: Negative for visual disturbance.  Respiratory: Negative for cough, chest tightness, shortness of breath and wheezing.   Cardiovascular: Negative for chest pain.  Gastrointestinal: Negative for nausea, vomiting, abdominal pain, diarrhea and abdominal distention.  Endocrine: Negative for polydipsia, polyphagia and polyuria.  Genitourinary: Negative for dysuria, frequency and hematuria.  Musculoskeletal: Negative for gait problem.  Skin: Negative for color change, pallor and rash.  Neurological: Positive for speech difficulty. Negative for dizziness, syncope, light-headedness and headaches.  Hematological: Does not bruise/bleed easily.  Psychiatric/Behavioral: Negative for behavioral problems and confusion.      Allergies  Review of patient's allergies indicates no known allergies.  Home Medications   Prior to Admission medications   Medication Sig Start Date End Date Taking? Authorizing Provider  Albiglutide (TANZEUM) 30 MG PEN Inject 30 mg into the skin once a week.   Yes Historical Provider, MD  amLODipine (NORVASC) 10 MG tablet Take 10 tablets by mouth daily. 02/24/14  Yes Historical Provider, MD  aspirin 325 MG tablet Take 1 tablet (325 mg total) by mouth daily. 01/18/14  Yes Geradine Girt, DO  atorvastatin (LIPITOR)  80 MG tablet Take 1 tablet (80 mg total) by mouth daily at 6 PM. 01/18/14  Yes Geradine Girt, DO  Azilsartan Medoxomil (EDARBI) 40 MG TABS Take 1 tablet by mouth daily.   Yes Historical Provider, MD  cabergoline (DOSTINEX) 0.5 MG tablet Take 0.5 tablets (0.25 mg total) by mouth 2 (two) times a week. 07/12/13  Yes Jessica U Vann, DO  escitalopram (LEXAPRO) 20 MG tablet Take 40 mg by mouth daily.   Yes Historical Provider, MD  furosemide (LASIX) 80 MG tablet Take 80 mg by mouth daily.    Yes Historical Provider, MD  insulin  glargine (LANTUS) 100 UNIT/ML injection Inject 0.3 mLs (30 Units total) into the skin daily. 07/12/13  Yes Jessica U Vann, DO  isosorbide-hydrALAZINE (BIDIL) 20-37.5 MG per tablet Take 2 tablets by mouth 3 (three) times daily.   Yes Historical Provider, MD  labetalol (NORMODYNE) 200 MG tablet Take 200 mg by mouth 3 (three) times daily. 07/12/13  Yes Geradine Girt, DO  losartan (COZAAR) 50 MG tablet Take 1 tablet (50 mg total) by mouth daily. Patient not taking: Reported on 12/02/2014 08/06/14   Roselee Culver, MD   BP 168/84 mmHg  Pulse 68  Temp(Src) 97.9 F (36.6 C) (Oral)  Resp 16  Ht 5' 9.5" (1.765 m)  SpO2 97% Physical Exam  Constitutional: He is oriented to person, place, and time. He appears well-developed and well-nourished. No distress.  HENT:  Head: Normocephalic.  Eyes: Conjunctivae are normal. Pupils are equal, round, and reactive to light. No scleral icterus.  Neck: Normal range of motion. Neck supple. No thyromegaly present.  Cardiovascular: Normal rate and regular rhythm.  Exam reveals no gallop and no friction rub.   No murmur heard. Pulmonary/Chest: Effort normal and breath sounds normal. No respiratory distress. He has no wheezes. He has no rales.  Abdominal: Soft. Bowel sounds are normal. He exhibits no distension. There is no tenderness. There is no rebound.  Musculoskeletal: Normal range of motion.  Neurological: He is alert and oriented to person, place, and time.  Fluent speech. No cranial nerve deficits. Normal vision. No strength or motor deficits.  Skin: Skin is warm and dry. No rash noted.  Psychiatric: He has a normal mood and affect. His behavior is normal.    ED Course  Procedures (including critical care time) Labs Review Labs Reviewed  CBC WITH DIFFERENTIAL/PLATELET - Abnormal; Notable for the following:    RBC 4.14 (*)    Hemoglobin 11.7 (*)    HCT 36.2 (*)    RDW 18.0 (*)    Neutrophils Relative % 84 (*)    Neutro Abs 8.3 (*)    Lymphocytes  Relative 10 (*)    All other components within normal limits  BASIC METABOLIC PANEL - Abnormal; Notable for the following:    Glucose, Bld 177 (*)    BUN 27 (*)    Creatinine, Ser 3.16 (*)    Calcium 8.7 (*)    GFR calc non Af Amer 19 (*)    GFR calc Af Amer 23 (*)    All other components within normal limits  CBG MONITORING, ED - Abnormal; Notable for the following:    Glucose-Capillary 136 (*)    All other components within normal limits  TROPONIN I    Imaging Review [name]  12/02/2014   CLINICAL DATA:  Aphasia  EXAM: MRI HEAD WITHOUT CONTRAST  TECHNIQUE: Multiplanar, multiecho pulse sequences of the brain and surrounding structures  were obtained without intravenous contrast.  COMPARISON:  01/13/2014 brain MRI  FINDINGS: Calvarium and upper cervical spine: No focal marrow signal abnormality.  Orbits: No significant findings.  Sinuses and Mastoids: Clear. Mastoid and middle ears are clear.  Brain: Restricted diffusion in the left upper putamen in the corona radiata consistent with acute perforator infarct. This is anterior to the acute infarct seen in 2015. Additionally, there has been a remote hemorrhagic infarct in the central left thalamus which has occurred since prior. Chronic vessel disease which is extensive for age, with confluent T2 and FLAIR hyperintensity around the lateral ventricles and an additional lacunar infarct in the right corona radiata. Chronic ischemic change is present throughout the pons. 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 No evidence of major vessel occlusion, acute hemorrhage, or obstructive hydrocephalus.  IMPRESSION: 1. Acute, nonhemorrhagic perforator infarct affecting the left upper putamen and corona radiata. 2. 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 A lacunar infarct in the left thalamus is chronic but has occurred since most recent imaging 01/13/2014.   Electronically Signed   By: Monte Fantasia M.D.   On:  12/02/2014 16:15     EKG Interpretation None      MDM   Final diagnoses:  Cerebral infarction due to unspecified mechanism    Patient asymptomatic at time of original exam, however still with a pause at each question, but ultimately, accurate questions.. Several hours have passed. Awaiting MRI. Apparently only one table is available due to his size. Patient requesting something to eat. I asked nurse to complete swallow study. He has difficulty with swallowing. On reexam he has redeveloped some increasing difficulty with his speech at approximately 1330. I paged neurology for consultation.  16:24:  Patient seen by neurology who felt that patient's last time no normal was very likely last p.m. in speaking with patient and family. Formal consultation pending. MRI shows acute left hemispheric CVA. Patient is followed by [facility] urgent care. Family medicine resident service is full. Will admit to unassigned medicine.    Tanna Furry, MD 12/02/14 [phone]

## 2014-12-02 NOTE — ED Notes (Signed)
Attempted report 

## 2014-12-02 NOTE — ED Notes (Signed)
Pt more confused at this time. Pt is unable to answer his age, or month. Pt also experiencing some expressive aphasia. Pt failed stroke swallow evaluation. MD notified.

## 2014-12-02 NOTE — ED Notes (Signed)
Pt presents via [facility] from home.  Last night wife seemed to think pt was having trouble speaking, went to bed, woke up this morning and felt "funny", thought his sugar was low, ate a peppermint and feeling resolved.  Pt has hx of CVA 2 years ago, per wife pt is at baseline.  On assessment, pt still having difficulty getting his words out.  Pt also reports diarrhea x 1 this AM.  No other neuro deficits noted.  Denies pain/N/V.  EKG unremarkable, NSR.  BP-126/80 CBG-183.  Pt a x 4, NAD.  Denies pain.

## 2014-12-02 NOTE — H&P (Signed)
[facility] History and Physical  [employer] VQQ:[id number] DOB: Nov 12, 1950 DOA: 12/02/2014  Referring physician: Dr. Tanna Furry PCP: Roselee Culver, MD    Chief Complaint:  Aphasia since one day  HPI:  64 year old morbidly obese male with history of CVA (admitted in July 2015 for acute right-sided infarct),.hypertension, sleep apnea on nightly CPAP, pituitary prolactinoma, chronic kidney disease stage III (see [name] , baseline creatinine of 2.5), venous stasis ulcer, uncontrolled type 2 diabetes mellitus (not taking any medications for the past 4 months,? Due to cost) who was brought to the ED by his wife for acute onset of aphasia since last evening. Patient was at home watching TV and when wife called him from work he had significant word finding difficulty and was representing words. He was also increasingly confused. This morning he again had similar symptoms and increased confusion. He was also unable to cough properly. Patient is aware that he is in the hospital [facility]) but cannot extend why he is here. He is oriented to person, but gets confused with date. Patient denies any headache, blurred vision, weakness or numbness, chest pain, palpitations, shortness of breath, fevers, chills, nausea, vomiting, abdominal pain, bowel or urinary symptoms. At baseline he ambulate with the help of a cane. Wife reports that patient takes his medications regularly as prescribed. He has not been taking his insulin or diabetic medication for almost last 4 months as they were expensive and thinks he does not need them anymore. Patient has not been able to establish a care with a primary doctor for past several months as well.  Course in the ED  Code stroke was called but no TPA given as onset of symptoms not well determined. Patient's vitals were stable. Blood will done showed a normal WBC, hemoglobin of 11.7, normal creases. Chemistry showed BUN of 27 and creatinine of 3.16.  Initial troponin was negative. EKG unremarkable. MRI of the brain without contrast done which showed an acute nonhemorrhagic perforator infarct affecting left upper putamen and corona radiata. Another lacunar infarct in the left thalamus seen which is new since last MRI from 01/13/2014.  Review of Systems: (Limited as unable to provide detailed history) Constitutional: Denies fever, chills, diaphoresis, appetite change and fatigue.  HEENT: Denies photophobia, eye pain, redness, hearing loss, ear pain, congestion, sore throat, rhinorrhea, sneezing, mouth sores, trouble swallowing, neck pain, neck stiffness and tinnitus.   Respiratory: Denies SOB, DOE, cough, chest tightness,  and wheezing.   Cardiovascular: Denies chest pain, palpitations and leg swelling.  Gastrointestinal: Denies nausea, vomiting, abdominal pain, diarrhea, constipation, blood in stool and abdominal distention.  Genitourinary: Denies dysuria,hematuria, flank pain and difficulty urinating.  Endocrine: Denies: hot or cold intolerance, polyuria, polydipsia. Musculoskeletal: Denies myalgias, back pain, joint pain or swelling Skin: Denies pallor, rash and wound.  Neurological: Denies dizziness, seizures, syncope, weakness, light-headedness, numbness and headaches.  Hematological: Denies adenopathy. Psychiatric/Behavioral: Increased confusion     Past Medical History  Diagnosis Date  . Hypertension   . Diabetes mellitus without complication   . Sleep apnea     uses cpap, setting of 5  . Chronic kidney disease     ssees dr Florene Glen nephrology every 6-9 months  . Multiple wounds     on lower legs - sees [name] Pain at the [place]   Past Surgical History  Procedure Laterality Date  . Colonscopy  6-7 yrs ago  . Colonoscopy N/A 04/05/2013    Procedure: COLONOSCOPY;  Surgeon: Juanita Craver, MD;  Location: [facility];  Service: Endoscopy;  Laterality: N/A;   Social History:  reports that he quit smoking about 44 years ago.  His smoking use included Cigarettes. He has a .5 pack-year smoking history. He has never used smokeless tobacco. He reports that he does not drink alcohol or use illicit drugs.  No Known Allergies  Family History  Problem Relation Age of Onset  . Diabetes Mother   . Hypertension Mother   . Diabetes Father   . Hypertension Father   . Diabetes Sister   . Diabetes Brother     Prior to Admission medications   Medication Sig Start Date End Date Taking? Authorizing Provider  Albiglutide (TANZEUM) 30 MG PEN Inject 30 mg into the skin once a week.   Yes Historical Provider, MD  amLODipine (NORVASC) 10 MG tablet Take 10 tablets by mouth daily. 02/24/14  Yes Historical Provider, MD  aspirin 325 MG tablet Take 1 tablet (325 mg total) by mouth daily. 01/18/14  Yes Geradine Girt, DO  atorvastatin (LIPITOR) 80 MG tablet Take 1 tablet (80 mg total) by mouth daily at 6 PM. 01/18/14  Yes Geradine Girt, DO  Azilsartan Medoxomil (EDARBI) 40 MG TABS Take 1 tablet by mouth daily.   Yes Historical Provider, MD  cabergoline (DOSTINEX) 0.5 MG tablet Take 0.5 tablets (0.25 mg total) by mouth 2 (two) times a week. 07/12/13  Yes Jessica U Vann, DO  escitalopram (LEXAPRO) 20 MG tablet Take 40 mg by mouth daily.   Yes Historical Provider, MD  furosemide (LASIX) 80 MG tablet Take 80 mg by mouth daily.    Yes Historical Provider, MD  insulin glargine (LANTUS) 100 UNIT/ML injection Inject 0.3 mLs (30 Units total) into the skin daily. 07/12/13  Yes Jessica U Vann, DO  isosorbide-hydrALAZINE (BIDIL) 20-37.5 MG per tablet Take 2 tablets by mouth 3 (three) times daily.   Yes Historical Provider, MD  labetalol (NORMODYNE) 200 MG tablet Take 200 mg by mouth 3 (three) times daily. 07/12/13  Yes Geradine Girt, DO  losartan (COZAAR) 50 MG tablet Take 1 tablet (50 mg total) by mouth daily. Patient not taking: Reported on 12/02/2014 08/06/14   Roselee Culver, MD     Physical Exam:  Filed Vitals:   12/02/14 1445 12/02/14 1500  12/02/14 1629 12/02/14 1630  BP: 144/85 153/73 170/99 168/84  Pulse: 71 67 66 68  Temp:      TempSrc:      Resp:   16   Height:      SpO2: 95% 94% 98% 97%    Constitutional: Vital signs reviewed.  Elderly obese male lying in bed in no acute distress HEENT: no pallor, no icterus, moist oral mucosa, no cervical lymphadenopathy, supple neck Cardiovascular: RRR, S1 normal, S2 normal, no MRG Chest: CTAB, no wheezes, rales, or rhonchi Abdominal: Soft. Non-tender, non-distended, bowel sounds are normal, Ext: warm, no edema Neurological: A&O x1-2 ( disoriented to time and partially to place), is word finding difficulty, pain was 2-12 intact, normal motor tone and reflex, sensations intact. Cerebellar function and gait not checked.  Labs on Admission:  Basic Metabolic Panel:  Recent Labs Lab 12/02/14 1036  NA 139  K 3.9  CL 107  CO2 24  GLUCOSE 177*  BUN 27*  CREATININE 3.16*  CALCIUM 8.7*   Liver Function Tests: No results for input(s): AST, ALT, ALKPHOS, BILITOT, PROT, ALBUMIN in the last 168 hours. No results for input(s): LIPASE, AMYLASE in the last 168 hours. No  results for input(s): AMMONIA in the last 168 hours. CBC:  Recent Labs Lab 12/02/14 1036  WBC 9.9  NEUTROABS 8.3*  HGB 11.7*  HCT 36.2*  MCV 87.4  PLT 354   Cardiac Enzymes:  Recent Labs Lab 12/02/14 1036  TROPONINI <0.03   BNP: Invalid input(s): POCBNP CBG:  Recent Labs Lab 12/02/14 1409  GLUCAP 136*    Radiological Exams on Admission: [name]  12/02/2014   CLINICAL DATA:  Aphasia  EXAM: MRI HEAD WITHOUT CONTRAST  TECHNIQUE: Multiplanar, multiecho pulse sequences of the brain and surrounding structures were obtained without intravenous contrast.  COMPARISON:  01/13/2014 brain MRI  FINDINGS: Calvarium and upper cervical spine: No focal marrow signal abnormality.  Orbits: No significant findings.  Sinuses and Mastoids: Clear. Mastoid and middle ears are clear.  Brain: Restricted  diffusion in the left upper putamen in the corona radiata consistent with acute perforator infarct. This is anterior to the acute infarct seen in 2015. Additionally, there has been a remote hemorrhagic infarct in the central left thalamus which has occurred since prior. Chronic vessel disease which is extensive for age, with confluent T2 and FLAIR hyperintensity around the lateral ventricles and an additional lacunar infarct in the right corona radiata. Chronic ischemic change is present throughout the pons. 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 No evidence of major vessel occlusion, acute hemorrhage, or obstructive hydrocephalus.  IMPRESSION: 1. Acute, nonhemorrhagic perforator infarct affecting the left upper putamen and corona radiata. 2. 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 A lacunar infarct in the left thalamus is chronic but has occurred since most recent imaging 01/13/2014.   Electronically Signed   By: Monte Fantasia M.D.   On: 12/02/2014 16:15    EKG: Sinus rhythm at 71, T-wave flattening in lateral leads.  Assessment/Plan  Principal Problem:   Acute ischemic stroke Admit to telemetry  neuro checks q4 hr MRI brain showing acute nonhemorrhagic left sided infarct. Check MRA brain, 2-D echo, carotid Doppler, A1c and lipid panel -Failed swallow evaluation in the ED. Will repeat swallow evaluation in the morning. Nothing by mouth. PT, OT evaluation . -shin on full dose aspirin. Switch to Plavix . Continue Lipitor 80 mg daily. -Appreciate neurology recommendations.  Active Problems: Acute on chronic kidney disease stage III Possibly related to diabetic nephropathy. Renal function unavailable July 2015. Will hold Lasix and ARB. Patient gentle IV hydration. Monitor renal function in am. Sees [name] as outpatient.    OSA (obstructive sleep apnea) Continue bedtime CPAP  Uncontrolled type 2 diabetes mellitus, with nephropathy Patient not taking any  medication for the past 4 months. Would place on sliding scale insulin every 4 hours while he is anterior. Check A1c.    Essential hypertension, benign On multiple blood pressure medications. (Lasix, biotin, labetalol,azilsartan). Holding Lasix and ARB due to acute kidney injury. Allow permissive blood pressure.    HLD (hyperlipidemia) Continue statin.  hyperprolactinoma continue cabergoline twice weekly.    Diet: Nothing by mouth  DVT prophylaxis: sq heparin   Code Status: full code Family Communication: discussed with wife at bedside Disposition Plan: Admit to telemetry.  Louellen Molder [facility] Pager [phone]  Total time spent on admission :70 minutes  If 7PM-7AM, please contact night-coverage [email or web address] Password [facility] 12/02/2014, 5:08 PM

## 2014-12-03 ENCOUNTER — Inpatient Hospital Stay (HOSPITAL_COMMUNITY): Payer: 59

## 2014-12-03 DIAGNOSIS — E118 Type 2 diabetes mellitus with unspecified complications: Secondary | ICD-10-CM

## 2014-12-03 DIAGNOSIS — I6350 Cerebral infarction due to unspecified occlusion or stenosis of unspecified cerebral artery: Secondary | ICD-10-CM

## 2014-12-03 DIAGNOSIS — I10 Essential (primary) hypertension: Secondary | ICD-10-CM

## 2014-12-03 DIAGNOSIS — G4733 Obstructive sleep apnea (adult) (pediatric): Secondary | ICD-10-CM

## 2014-12-03 LAB — GLUCOSE, CAPILLARY
Glucose-Capillary: 106 mg/dL — ABNORMAL HIGH (ref 65–99)
Glucose-Capillary: 123 mg/dL — ABNORMAL HIGH (ref 65–99)
Glucose-Capillary: 129 mg/dL — ABNORMAL HIGH (ref 65–99)
Glucose-Capillary: 130 mg/dL — ABNORMAL HIGH (ref 65–99)
Glucose-Capillary: 142 mg/dL — ABNORMAL HIGH (ref 65–99)
Glucose-Capillary: 221 mg/dL — ABNORMAL HIGH (ref 65–99)

## 2014-12-03 LAB — BASIC METABOLIC PANEL
Anion gap: 9 (ref 5–15)
BUN: 22 mg/dL — AB (ref 6–20)
CO2: 26 mmol/L (ref 22–32)
CREATININE: 2.86 mg/dL — AB (ref 0.61–1.24)
Calcium: 9 mg/dL (ref 8.9–10.3)
Chloride: 105 mmol/L (ref 101–111)
GFR calc Af Amer: 25 mL/min — ABNORMAL LOW (ref >60)
GFR, EST NON AFRICAN AMERICAN: 22 mL/min — AB (ref >60)
GLUCOSE: 123 mg/dL — AB (ref 65–99)
POTASSIUM: 3.5 mmol/L (ref 3.5–5.1)
Sodium: 140 mmol/L (ref 135–145)

## 2014-12-03 LAB — MRSA PCR SCREENING: MRSA by PCR: POSITIVE — AB

## 2014-12-03 LAB — LIPID PANEL
Cholesterol: 243 mg/dL — ABNORMAL HIGH (ref 0–200)
HDL: 51 mg/dL (ref >40)
LDL CALC: 169 mg/dL — AB (ref 0–99)
Total CHOL/HDL Ratio: 4.8 RATIO
Triglycerides: 113 mg/dL (ref <150)
VLDL: 23 mg/dL (ref 0–40)

## 2014-12-03 LAB — VITAMIN B12: VITAMIN B 12: 421 pg/mL (ref 180–914)

## 2014-12-03 LAB — TSH: TSH: 3.182 u[IU]/mL (ref 0.350–4.500)

## 2014-12-03 MED ORDER — MUPIROCIN 2 % EX OINT
1.0000 "application " | TOPICAL_OINTMENT | Freq: Two times a day (BID) | CUTANEOUS | Status: DC
  Administered 2014-12-03 – 2014-12-06 (×8): 1 via NASAL
  Filled 2014-12-03: qty 22

## 2014-12-03 MED ORDER — AMLODIPINE BESYLATE 10 MG PO TABS: 100.0000 mg | ORAL_TABLET | Freq: Every day | ORAL | Status: DC

## 2014-12-03 MED ORDER — CHLORHEXIDINE GLUCONATE CLOTH 2 % EX PADS
6.0000 | MEDICATED_PAD | Freq: Every day | CUTANEOUS | Status: DC
  Administered 2014-12-03 – 2014-12-06 (×4): 6 via TOPICAL

## 2014-12-03 MED ORDER — INSULIN ASPART 100 UNIT/ML ~~LOC~~ SOLN: 0.0000 [IU] | Freq: Every day | SUBCUTANEOUS | Status: DC

## 2014-12-03 MED ORDER — INSULIN ASPART 100 UNIT/ML ~~LOC~~ SOLN
0.0000 [IU] | Freq: Three times a day (TID) | SUBCUTANEOUS | Status: DC
  Administered 2014-12-03: 5 [IU] via SUBCUTANEOUS
  Administered 2014-12-04 – 2014-12-06 (×6): 2 [IU] via SUBCUTANEOUS

## 2014-12-03 NOTE — Progress Notes (Signed)
Place patient on CPAP 6cm H20 for the night via medium nasal mask.  Patient is tolerating well at this time.

## 2014-12-03 NOTE — Progress Notes (Signed)
VASCULAR LAB PRELIMINARY  PRELIMINARY  PRELIMINARY  PRELIMINARY  Carotid duplex  completed.    Preliminary report:  Bilateral:  1-39% ICA stenosis.  Vertebral artery flow is antegrade.      Karanvir Balderston, RVT 12/03/2014, 4:20 PM

## 2014-12-03 NOTE — Evaluation (Signed)
Physical Therapy Evaluation Patient Details Name: Blake Brach Sr. MRN: [id number] DOB: 07-14-1950 Today's Date: 12/03/2014   History of Present Illness  Patient is a 64 yo male admitted 12/02/14 with difficulty with speech and confusion.  MRI:  Acute, nonhemorrhagic perforator infarct affecting the left upper putamen and corona radiata.  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  Will benefit from acute PT to maximize functional mobility prior to discharge home with wife.  Patient will need 24 hour assist at discharge.  Recommend HHPT f/u.    Follow Up Recommendations Home health PT;Supervision/Assistance - 24 hour    Equipment Recommendations  None recommended by PT    Recommendations for Other Services       Precautions / Restrictions Precautions Precautions: Fall Restrictions Weight Bearing Restrictions: No      Mobility  Bed Mobility Overal bed mobility: Needs Assistance Bed Mobility: Supine to Sit     Supine to sit: Min assist;HOB elevated     General bed mobility comments: Assist to raise trunk to upright position.  Good sitting balance.  Transfers Overall transfer level: Needs assistance Equipment used: Rolling walker (2 wheeled) Transfers: Sit to/from Stand Sit to Stand: Min assist;+2 safety/equipment         General transfer comment: Verbal cues for hand placement.  Assist to rise to standing and to steady during transfer.  Ambulation/Gait Ambulation/Gait assistance: Min assist;+2 safety/equipment (+1 pushing chair behind patient) Ambulation Distance (Feet): 42 Feet Assistive device: Rolling walker (2 wheeled) Gait Pattern/deviations: Step-through pattern;Decreased stride length;Trunk flexed Gait velocity: Decreased Gait velocity interpretation: Below normal speed for age/gender General Gait Details: Verbal cues for safe use of RW.  Cues to stand  upright during gait.  No dizziness reported with gait.  Stairs            Wheelchair Mobility    Modified Rankin (Stroke Patients Only) Modified Rankin (Stroke Patients Only) Pre-Morbid Rankin Score: Moderate disability Modified Rankin: Moderately severe disability     Balance Overall balance assessment: Needs assistance         Standing balance support: Bilateral upper extremity supported Standing balance-Leahy Scale: Poor                               Pertinent Vitals/Pain Pain Assessment: No/denies pain    Home Living Family/patient expects to be discharged to:: Private residence Living Arrangements: Spouse/significant other Available Help at Discharge: Family;Available 24 hours/day Type of Home: House Home Access: Level entry     Home Layout: One level Home Equipment: Walker - 2 wheels;Cane - single point;Electric scooter (Bed with elevating HOB)      Prior Function Level of Independence: Independent with assistive device(s);Needs assistance   Gait / Transfers Assistance Needed: Uses cane for ambulation, or electric scooter  ADL's / Homemaking Assistance Needed: Assist for ADL's        Hand Dominance   Dominant Hand: Right    Extremity/Trunk Assessment   Upper Extremity Assessment: Defer to OT evaluation           Lower Extremity Assessment: Generalized weakness         Communication   Communication: Expressive difficulties  Cognition Arousal/Alertness: Awake/alert Behavior During Therapy: WFL for tasks assessed/performed (Smiling during session) Overall Cognitive Status: Difficult to assess  General Comments      Exercises        Assessment/Plan    PT Assessment Patient needs continued PT services  PT Diagnosis Difficulty walking;Abnormality of gait;Generalized weakness   PT Problem List Decreased strength;Decreased activity tolerance;Decreased balance;Decreased mobility;Decreased  knowledge of use of DME;Obesity  PT Treatment Interventions DME instruction;Gait training;Functional mobility training;Therapeutic activities;Balance training;Patient/family education   PT Goals (Current goals can be found in the Care Plan section) Acute Rehab PT Goals Patient Stated Goal: None stated PT Goal Formulation: With patient Time For Goal Achievement: 12/10/14 Potential to Achieve Goals: Good    Frequency Min 4X/week   Barriers to discharge        Co-evaluation               End of Session Equipment Utilized During Treatment: Gait belt Activity Tolerance: Patient limited by fatigue Patient left: in chair;with call bell/phone within reach;with chair alarm set Nurse Communication: Mobility status         Time: 7867-6720 PT Time Calculation (min) (ACUTE ONLY): 19 min   Charges:   PT Evaluation $Initial PT Evaluation Tier I: 1 Procedure     PT G CodesDespina Bell Dec 31, 2014, 7:10 PM Blake Bell, Blake Bell Pager [phone]

## 2014-12-03 NOTE — Progress Notes (Signed)
STROKE TEAM PROGRESS NOTE   HISTORY Blake Montilla Sr. is a 64 y.o. male with a history of hypertension, chronic kidney disease, diabetes mellitus, obstructive sleep apnea, and previous stroke, who presented to the emergency department today for further evaluation of speech difficulties. An MRI from July 2015 revealed an acute ischemic infarct involving the right putamen with superior extension into the right corona radiata.  The patient's wife reported that she phoned the patient at approximately 9 PM last night before returning home from work. She noticed that the patient was having significant word finding difficulties. The deficits persisted today and he was brought to the emergency department for further evaluation. An MRI is currently pending. The patient and his wife deny any recent speech difficulties until last night. He has experienced a congested cough recently and his wife felt he was having some difficulty with his swallowing. His speech difficulties have improved since arrival to the emergency department today. The patient takes an aspirin daily and denies any recent missed doses.   Date last known well: Date: 12/01/2014 Time last known well: Unable to determine tPA Given: No: Late presentation and unknown time of onset.   SUBJECTIVE (INTERVAL HISTORY) Overall he feels his condition is better. History somewhat limited.    OBJECTIVE Temp:  [97.6 F (36.4 C)-98.2 F (36.8 C)] 97.6 F (36.4 C) (06/18 0600) Pulse Rate:  [65-90] 87 (06/18 0600) Cardiac Rhythm:  [-] Normal sinus rhythm (06/17 2000) Resp:  [13-20] 18 (06/18 0600) BP: (125-199)/(61-112) 178/102 mmHg (06/18 0600) SpO2:  [92 %-100 %] 95 % (06/18 0600) Weight:  [150.141 kg (331 lb)] 150.141 kg (331 lb) (06/17 1812)   Recent Labs Lab 12/02/14 1409 12/02/14 2004 12/03/14 0006 12/03/14 0407 12/03/14 0817  GLUCAP 136* 130* 106* 129* 123*    Recent Labs Lab 12/02/14 1036 12/03/14 0545  NA 139 140  K 3.9  3.5  CL 107 105  CO2 24 26  GLUCOSE 177* 123*  BUN 27* 22*  CREATININE 3.16* 2.86*  CALCIUM 8.7* 9.0   No results for input(s): AST, ALT, ALKPHOS, BILITOT, PROT, ALBUMIN in the last 168 hours.  Recent Labs Lab 12/02/14 1036  WBC 9.9  NEUTROABS 8.3*  HGB 11.7*  HCT 36.2*  MCV 87.4  PLT 354    Recent Labs Lab 12/02/14 1036  TROPONINI <0.03   No results for input(s): LABPROT, INR in the last 72 hours. No results for input(s): COLORURINE, LABSPEC, [place], GLUCOSEU, HGBUR, BILIRUBINUR, KETONESUR, PROTEINUR, UROBILINOGEN, NITRITE, LEUKOCYTESUR in the last 72 hours.  Invalid input(s): APPERANCEUR     Component Value Date/Time   CHOL 243* 12/03/2014 0545   TRIG 113 12/03/2014 0545   HDL 51 12/03/2014 0545   CHOLHDL 4.8 12/03/2014 0545   VLDL 23 12/03/2014 0545   LDLCALC 169* 12/03/2014 0545   Lab Results  Component Value Date   HGBA1C 9.9* 01/14/2014      Component Value Date/Time   LABOPIA NONE DETECTED 01/13/2014 2036   COCAINSCRNUR NONE DETECTED 01/13/2014 2036   LABBENZ NONE DETECTED 01/13/2014 2036   AMPHETMU NONE DETECTED 01/13/2014 2036   THCU NONE DETECTED 01/13/2014 2036   LABBARB NONE DETECTED 01/13/2014 2036    No results for input(s): ETH in the last 168 hours.  [name] 12/02/2014    1. Acute, nonhemorrhagic perforator infarct affecting the left upper putamen and corona radiata.  2. 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 A lacunar infarct in the left thalamus is chronic but has occurred since most recent  imaging 01/13/2014.      [name] 12/03/2014    Mild to moderate stenosis proximal right M1 segment. Otherwise no significant intracranial stenosis.       PHYSICAL EXAM  GENERAL: Obese. No distress.   HEENT: Large neck  ABDOMEN: soft  EXTREMITIES: No edema   BACK: normal  SKIN: Normal by inspection.    MENTAL STATUS: Alert. Speech - moderate dysathria, language - some difficulty with comprehension;  naming good got 5/5. Judgment and insight somewhat limited.   CRANIAL NERVES: Pupils are equal, round and reactive to light and accomodation; extra ocular movements are full, there is no significant nystagmus; visual fields are full; upper and lower facial muscles are normal in strength and symmetric, there flattening of the nasolabial fold-R; tongue is midline; uvula is midline; shoulder elevation is normal.  MOTOR: Normal tone, bulk and strength; no pronator drift.  COORDINATION: Left finger to nose is normal, right finger to nose is normal, No rest tremor; no intention tremor; no postural tremor; no bradykinesia   SENSATION: Normal to light touch and temperature.          ASSESSMENT/PLAN [name] is a 64 y.o. male with history of hypertension, chronic kidney disease, diabetes mellitus, obstructive sleep apnea, and previous stroke presenting with speech difficulties. He did not receive IV t-PA due to late presentation and resolving deficits.  Stroke:  Dominant acute, nonhemorrhagic perforator infarct secondary to small vessel disease.  Resultant  dysarhria  MRI  - acute, nonhemorrhagic perforator infarct affecting the left upper putamen and corona radiata.   MRA - Mild to moderate stenosis proximal right M1 segment.  Carotid Doppler - pending  2D Echo - pending  LDL 169  HgbA1c pending  Subcutaneous heparin for VTE prophylaxis    aspirin 325 mg orally every day prior to admission, now on clopidogrel 75 mg orally every day  Patient counseled to be compliant with his antithrombotic medications  Ongoing aggressive stroke risk factor management  Therapy recommendations: Pending  Disposition: Pending   Hypertension  Home meds:  Norvasc 10 mg daily, Cozaar 50 mg daily, and labetalol 200 mg 3 times daily Permissive hypertension <220/120 for 24-48 hours and then gradually normalize within 5-7 days Patient counseled to be compliant with his blood  pressure medications    Hyperlipidemia  Home meds:  Lipitor 80 mg daily resumed in hospital  LDL 169 goal < 70  Confirm compliance with Lipitor  Continue statin at discharge  Diabetes  HgbA1c goal < 7.0  Controlled  Other Stroke Risk Factors  Advanced age  Cigarette smoker, quit smoking 44 years ago.  Obesity, Body mass index is 48.2 kg/(m^2).   Hx stroke/TIA  Obstructive sleep apnea   Other Active Problems  Mild anemia  Other Pertinent History    Hospital day # 1  Dr Merlene Laughter - The patient was seen and examined by me; notes, chart and tests reviewed and discussed with midlevel provider, other providers, patient, and family.       To contact Stroke Continuity provider, please refer to [email or web address] After hours, contact General Neurology

## 2014-12-03 NOTE — Progress Notes (Signed)
PROGRESS NOTE  Blake Ina Sr. HYW:[id number] DOB: [birth year] DOA: 12/02/2014 PCP: Roselee Culver, MD  Assessment/Plan: Acute ischemic stroke MRI brain showing acute nonhemorrhagic left sided infarct.  -MRA brain -2-D echo -carotid Doppler -A1c and lipid panel: LDL 169- on lipitor 80 at home -PT, OT evaluation . -was on full dose aspirin. Switch to Plavix  -Appreciate neurology recommendations.   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  -baseline around 2.1-2.3   OSA (obstructive sleep apnea) Continue bedtime CPAP  Uncontrolled type 2 diabetes mellitus, with nephropathy Patient not taking any medication for the past 4 months. Would place on sliding scale insulin every 4 hours   Check A1c.   Essential hypertension, benign On multiple blood pressure medications. (Lasix, biotin, labetalol,azilsartan). Holding Lasix and ARB due to acute kidney injury. Allow permissive blood pressure.   HLD (hyperlipidemia) Continue statin.  hyperprolactinoma continue cabergoline twice weekly.  Code Status: full Family Communication: wife at bedside Disposition Plan:    Consultants:  neuro  Procedures:     HPI/Subjective: Having trouble with words per wife  Objective: Filed Vitals:   12/03/14 0600  BP: 178/102  Pulse: 87  Temp: 97.6 F (36.4 C)  Resp: 18    Intake/Output Summary (Last 24 hours) at 12/03/14 0752 Last data filed at 12/02/14 2039  Gross per 24 hour  Intake      0 ml  Output    550 ml  Net   -550 ml   Filed Weights   12/02/14 1812  Weight: 150.141 kg (331 lb)    Exam:   General:  Pleasant/cooperative  Cardiovascular: rrr  Respiratory: no wheezing  Abdomen: +Bs, soft  Data Reviewed: Basic Metabolic Panel:  Recent Labs Lab 12/02/14 1036 12/03/14 0545  NA 139 140  K 3.9 3.5  CL 107 105  CO2 24 26  GLUCOSE 177* 123*  BUN 27* 22*  CREATININE 3.16* 2.86*  CALCIUM 8.7* 9.0    Liver Function Tests: No results for input(s): AST, ALT, ALKPHOS, BILITOT, PROT, ALBUMIN in the last 168 hours. No results for input(s): LIPASE, AMYLASE in the last 168 hours. No results for input(s): AMMONIA in the last 168 hours. CBC:  Recent Labs Lab 12/02/14 1036  WBC 9.9  NEUTROABS 8.3*  HGB 11.7*  HCT 36.2*  MCV 87.4  PLT 354   Cardiac Enzymes:  Recent Labs Lab 12/02/14 1036  TROPONINI <0.03   BNP (last 3 results) No results for input(s): BNP in the last 8760 hours.  ProBNP (last 3 results) No results for input(s): PROBNP in the last 8760 hours.  CBG:  Recent Labs Lab 12/02/14 1409 12/02/14 2004 12/03/14 0006 12/03/14 0407  GLUCAP 136* 130* 106* 129*    Recent Results (from the past 240 hour(s))  MRSA PCR Screening     Status: Abnormal   Collection Time: 12/02/14  9:41 PM  Result Value Ref Range Status   MRSA by PCR POSITIVE (A) NEGATIVE Final    Comment:        The GeneXpert MRSA Assay (FDA approved for NASAL specimens only), is one component of a comprehensive MRSA colonization surveillance program. It is not intended to diagnose MRSA infection nor to guide or monitor treatment for MRSA infections. RESULT CALLED TO, READ BACK BY AND VERIFIED WITH: A [facility] [phone] [facility]      Studies: [name]  12/02/2014   CLINICAL DATA:  Aphasia  EXAM: MRI HEAD WITHOUT CONTRAST  TECHNIQUE: Multiplanar, multiecho  pulse sequences of the brain and surrounding structures were obtained without intravenous contrast.  COMPARISON:  01/13/2014 brain MRI  FINDINGS: Calvarium and upper cervical spine: No focal marrow signal abnormality.  Orbits: No significant findings.  Sinuses and Mastoids: Clear. Mastoid and middle ears are clear.  Brain: Restricted diffusion in the left upper putamen in the corona radiata consistent with acute perforator infarct. This is anterior to the acute infarct seen in 2015. Additionally, there has been a remote  hemorrhagic infarct in the central left thalamus which has occurred since prior. Chronic vessel disease which is extensive for age, with confluent T2 and FLAIR hyperintensity around the lateral ventricles and an additional lacunar infarct in the right corona radiata. Chronic ischemic change is present throughout the pons. 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 No evidence of major vessel occlusion, acute hemorrhage, or obstructive hydrocephalus.  IMPRESSION: 1. Acute, nonhemorrhagic perforator infarct affecting the left upper putamen and corona radiata. 2. 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 A lacunar infarct in the left thalamus is chronic but has occurred since most recent imaging 01/13/2014.   Electronically Signed   By: Monte Fantasia M.D.   On: 12/02/2014 16:15    Scheduled Meds: . atorvastatin  80 mg Oral q1800  . [START ON 12/05/2014] cabergoline  0.25 mg Oral Once per day on Mon Thu  . Chlorhexidine Gluconate Cloth  6 each Topical Q0600  . clopidogrel  75 mg Oral Q breakfast  . escitalopram  40 mg Oral Daily  . heparin  5,000 Units Subcutaneous 3 times per day  . insulin aspart  0-15 Units Subcutaneous 6 times per day  . isosorbide-hydrALAZINE  2 tablet Oral TID  . labetalol  200 mg Oral TID  . mupirocin ointment  1 application Nasal BID   Continuous Infusions: . sodium chloride 75 mL/hr at 12/03/14 0522   Antibiotics Given (last 72 hours)    None      Principal Problem:   Acute ischemic stroke Active Problems:   OSA (obstructive sleep apnea)   Morbid obesity   CVA (cerebral infarction)   Type 2 diabetes mellitus with complication   Essential hypertension, benign   HLD (hyperlipidemia)   Acute renal failure superimposed on stage 3 chronic kidney disease   Acute ischemic left MCA stroke    Time spent: 25 min    Walker Sitar  [facility] Pager [phone]. If 7PM-7AM, please contact night-coverage at [email or web address], password  [facility] 12/03/2014, 7:52 AM  LOS: 1 day

## 2014-12-03 NOTE — Progress Notes (Signed)
Wife yelling at Probation officer because patient encouraged to use urinal. Patient has not been evaluated by PT, and weighs >150 kg; tried to explain safety rationale to wife without success. MD notified and agreed patient should wait for PT eval before walked to BR. Incontinence cleaned and bed changed, wife totally silent.

## 2014-12-03 NOTE — Evaluation (Signed)
Clinical/Bedside Swallow Evaluation Patient Details  Name: Blake Lazalde Sr. MRN: [id number] Date of Birth: [birth year]  Today's Date: 12/03/2014 Time: SLP Start Time (ACUTE ONLY): 1255 SLP Stop Time (ACUTE ONLY): 1319 SLP Time Calculation (min) (ACUTE ONLY): 24 min  Past Medical History:  Past Medical History  Diagnosis Date  . Hypertension   . Multiple wounds     on lower legs - sees [name] Pain at the [facility]  . Hypercholesterolemia   . OSA on CPAP     uses cpap, setting of 5  . Prolactin secreting pituitary adenoma     [name] 12/02/2014  . Type II diabetes mellitus     uncontrolled/notes 12/02/2014  . Chronic kidney disease (CKD), stage III (moderate)     sees dr Florene Glen nephrology every 6-9 months  . CVA (cerebral vascular accident) 12/2013    admitted in July 2015 for acute right-sided infarct/notes 12/02/2014  . Stroke 12/02/2014    expressive aphasia   Past Surgical History:  Past Surgical History  Procedure Laterality Date  . Colonoscopy  6-7 yrs ago  . Colonoscopy N/A 04/05/2013    Procedure: COLONOSCOPY;  Surgeon: Juanita Craver, MD;  Location: [facility] ENDOSCOPY;  Service: Endoscopy;  Laterality: N/A;   HPI:  64 year old morbidly obese male with history of CVA (admitted in July 2015 for acute right-sided infarct),.hypertension, sleep apnea on nightly CPAP, pituitary prolactinoma, chronic kidney disease stage III (see [name] , baseline creatinine of 2.5), venous stasis ulcer, uncontrolled type 2 diabetes mellitus (not taking any medications for the past 4 months,? Due to cost) who was brought to the ED on 12/02/14 by his wife for acute onset of aphasia since last evening. Patient was at home watching TV and when wife called him from work he had significant word finding difficulty and was representing words. Pt denies any dysphagia; MRI head   Assessment / Plan / Recommendation Clinical Impression   Pt exhibited no overt s/s of aspiration during BSE with any consistency  including thin via cup, puree and solids with some impulsivity noted as pt took larger sips of liquids; but when cued, he decreased volume of thin liquids.  He does not use a straw at home, so this was not assessed during BSE. Pt consumed multiple bites of puree and solid consistency, as well as approximately 8 oz of liquid during BSE without any s/s of aspiration noted.  Pt with decreased orientation and expressive aphasia present throughout evaluation.  SLE pending; MRI head 12/03/14 indicated acute nonhemorrhagic infarct affecting left upper putamen and corona radiata; lacunar infarct in left thalamus is chronic but has occurred since most recent imaging on 01/13/14.    Aspiration Risk  Mild    Diet Recommendation Other (Comment) (Regular (carb mod/heart healthy))   Medication Administration: Other (Comment) Compensations: Slow rate;Small sips/bites    Other  Recommendations Oral Care Recommendations: Oral care BID   Follow Up Recommendations    Inpatient Rehab vs HH ST   Frequency and Duration min 2x/week  1 week   Pertinent Vitals/Pain Elevated BP    SLP Swallow Goals  See POC  Swallow Study Prior Functional Status   Home; semi-independent    General Date of Onset: 12/02/14 Other Pertinent Information: 64 year old morbidly obese male with history of CVA (admitted in July 2015 for acute right-sided infarct),.hypertension, sleep apnea on nightly CPAP, pituitary prolactinoma, chronic kidney disease stage III (see [name] , baseline creatinine of 2.5), venous stasis ulcer, uncontrolled type 2 diabetes mellitus (not taking any  medications for the past 4 months,? Due to cost) who was brought to the ED by his wife for acute onset of aphasia since last evening. Patient was at home watching TV and when wife called him from work he had significant word finding difficulty and was representing words. Type of Study: Bedside swallow evaluation Diet Prior to this Study: NPO Temperature Spikes  Noted: No Respiratory Status: Room air History of Recent Intubation: No Behavior/Cognition: Alert;Cooperative;Pleasant mood;Impulsive Oral Cavity - Dentition: Adequate natural dentition/normal for age Self-Feeding Abilities: Able to feed self Patient Positioning: Upright in chair/Tumbleform Baseline Vocal Quality: Normal Volitional Cough: Strong Volitional Swallow: Unable to elicit    Oral/Motor/Sensory Function Overall Oral Motor/Sensory Function: Impaired Labial ROM: Within Functional Limits Labial Symmetry: Within Functional Limits Labial Strength: Within Functional Limits Labial Sensation: Within Functional Limits Lingual ROM: Reduced right;Reduced left Lingual Symmetry: Within Functional Limits Lingual Strength: Reduced Lingual Sensation: Within Functional Limits Facial ROM: Within Functional Limits Facial Symmetry: Within Functional Limits Facial Strength: Within Functional Limits Facial Sensation: Within Functional Limits Velum: Within Functional Limits Mandible: Within Functional Limits   Ice Chips Ice chips: Within functional limits Presentation: Spoon   Thin Liquid Thin Liquid: Within functional limits Presentation: Cup    Nectar Thick Nectar Thick Liquid: Not tested   Honey Thick Honey Thick Liquid: Not tested   Puree Puree: Within functional limits Presentation: Self Fed   Solid       Solid: Within functional limits Presentation: Self Fed       ADAMS,PAT, M.S., CCC-SLP 12/03/2014,1:24 PM

## 2014-12-04 ENCOUNTER — Inpatient Hospital Stay (HOSPITAL_COMMUNITY): Payer: 59

## 2014-12-04 DIAGNOSIS — E785 Hyperlipidemia, unspecified: Secondary | ICD-10-CM

## 2014-12-04 DIAGNOSIS — I639 Cerebral infarction, unspecified: Secondary | ICD-10-CM

## 2014-12-04 DIAGNOSIS — N179 Acute kidney failure, unspecified: Secondary | ICD-10-CM

## 2014-12-04 DIAGNOSIS — N183 Chronic kidney disease, stage 3 (moderate): Secondary | ICD-10-CM

## 2014-12-04 LAB — GLUCOSE, CAPILLARY
GLUCOSE-CAPILLARY: 131 mg/dL — AB (ref 65–99)
GLUCOSE-CAPILLARY: 116 mg/dL — AB (ref 65–99)
Glucose-Capillary: 128 mg/dL — ABNORMAL HIGH (ref 65–99)
Glucose-Capillary: 145 mg/dL — ABNORMAL HIGH (ref 65–99)

## 2014-12-04 MED ORDER — AMLODIPINE BESYLATE 10 MG PO TABS
10.0000 mg | ORAL_TABLET | Freq: Every day | ORAL | Status: DC
  Administered 2014-12-04 – 2014-12-06 (×3): 10 mg via ORAL
  Filled 2014-12-04 (×3): qty 1

## 2014-12-04 NOTE — Progress Notes (Signed)
STROKE TEAM PROGRESS NOTE   HISTORY Blake Buchberger Sr. is a 64 y.o. male with a history of hypertension, chronic kidney disease, diabetes mellitus, obstructive sleep apnea, and previous stroke, who presented to the emergency department today for further evaluation of speech difficulties. An MRI from July 2015 revealed an acute ischemic infarct involving the right putamen with superior extension into the right corona radiata.  The patient's wife reported that she phoned the patient at approximately 9 PM last night before returning home from work. She noticed that the patient was having significant word finding difficulties. The deficits persisted today and he was brought to the emergency department for further evaluation. The patient and his wife deny any recent speech difficulties until last night. He has experienced a congested cough recently and his wife felt he was having some difficulty with his swallowing. His speech difficulties have improved since arrival to the emergency department today. The patient takes an aspirin daily and denies any recent missed doses.   Date last known well: Date: 12/01/2014 Time last known well: Unable to determine tPA Given: No: Late presentation and unknown time of onset.   SUBJECTIVE (INTERVAL HISTORY) Overall he feels his condition is better. History somewhat limited.    OBJECTIVE Temp:  [97.8 F (36.6 C)-98.6 F (37 C)] 98.1 F (36.7 C) (06/19 1049) Pulse Rate:  [67-84] 77 (06/19 1049) Cardiac Rhythm:  [-] Normal sinus rhythm (06/19 1100) Resp:  [18-20] 20 (06/19 1049) BP: (101-178)/(57-104) 121/67 mmHg (06/19 1049) SpO2:  [95 %-100 %] 100 % (06/19 1049)   Recent Labs Lab 12/03/14 1112 12/03/14 1638 12/03/14 2058 12/04/14 0716 12/04/14 1208  GLUCAP 130* 221* 142* 128* 131*    Recent Labs Lab 12/02/14 1036 12/03/14 0545  NA 139 140  K 3.9 3.5  CL 107 105  CO2 24 26  GLUCOSE 177* 123*  BUN 27* 22*  CREATININE 3.16* 2.86*   CALCIUM 8.7* 9.0   No results for input(s): AST, ALT, ALKPHOS, BILITOT, PROT, ALBUMIN in the last 168 hours.  Recent Labs Lab 12/02/14 1036  WBC 9.9  NEUTROABS 8.3*  HGB 11.7*  HCT 36.2*  MCV 87.4  PLT 354    Recent Labs Lab 12/02/14 1036  TROPONINI <0.03   No results for input(s): LABPROT, INR in the last 72 hours. No results for input(s): COLORURINE, LABSPEC, [place], GLUCOSEU, HGBUR, BILIRUBINUR, KETONESUR, PROTEINUR, UROBILINOGEN, NITRITE, LEUKOCYTESUR in the last 72 hours.  Invalid input(s): APPERANCEUR     Component Value Date/Time   CHOL 243* 12/03/2014 0545   TRIG 113 12/03/2014 0545   HDL 51 12/03/2014 0545   CHOLHDL 4.8 12/03/2014 0545   VLDL 23 12/03/2014 0545   LDLCALC 169* 12/03/2014 0545   Lab Results  Component Value Date   HGBA1C 9.9* 01/14/2014      Component Value Date/Time   LABOPIA NONE DETECTED 01/13/2014 2036   COCAINSCRNUR NONE DETECTED 01/13/2014 2036   LABBENZ NONE DETECTED 01/13/2014 2036   AMPHETMU NONE DETECTED 01/13/2014 2036   THCU NONE DETECTED 01/13/2014 2036   LABBARB NONE DETECTED 01/13/2014 2036    No results for input(s): ETH in the last 168 hours.  [name] 12/02/2014    1. Acute, nonhemorrhagic perforator infarct affecting the left upper putamen and corona radiata.  2. 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 A lacunar infarct in the left thalamus is chronic but has occurred since most recent imaging 01/13/2014.      [name] 12/03/2014    Mild  to moderate stenosis proximal right M1 segment. Otherwise no significant intracranial stenosis.       PHYSICAL EXAM  GENERAL: Obese. No distress.   HEENT: Large neck  ABDOMEN: soft  EXTREMITIES: No edema   BACK: normal  SKIN: Normal by inspection.    MENTAL STATUS: Alert. Speech - mild dysathria, language - some difficulty with comprehension; naming good got 5/5. Judgment and insight somewhat limited.   CRANIAL NERVES: Pupils are  equal, round and reactive to light and accomodation; extra ocular movements are full, there is no significant nystagmus; visual fields are full; upper and lower facial muscles are normal in strength and symmetric, there flattening of the nasolabial fold-R; tongue is midline; uvula is midline; shoulder elevation is normal.  MOTOR: Normal tone, bulk and strength; no pronator drift.  COORDINATION: Left finger to nose is normal, right finger to nose is normal, No rest tremor; no intention tremor; no postural tremor; no bradykinesia   SENSATION: Normal to light touch and temperature.          ASSESSMENT/PLAN Mr. Blake Lovern Sr. is a 64 y.o. male with history of hypertension, chronic kidney disease, diabetes mellitus, obstructive sleep apnea, and previous stroke presenting with speech difficulties. He did not receive IV t-PA due to late presentation and resolving deficits.  Stroke:  Dominant acute, nonhemorrhagic perforator infarct secondary to small vessel disease.  Resultant  dysarhria  MRI  - acute, nonhemorrhagic perforator infarct affecting the left upper putamen and corona radiata.   MRA - Mild to moderate stenosis proximal right M1 segment.  Carotid Doppler - pending  2D Echo - pending  LDL 169  HgbA1c pending  Additional labs pending.   Subcutaneous heparin for VTE prophylaxis Diet heart healthy/carb modified Room service appropriate?: Yes; Fluid consistency:: Thin  aspirin 325 mg orally every day prior to admission, now on clopidogrel 75 mg orally every day  Patient counseled to be compliant with his antithrombotic medications  Ongoing aggressive stroke risk factor management  Therapy recommendations: Home health physical therapy recommended  Disposition: Pending   Hypertension  Home meds:  Norvasc 10 mg daily, Cozaar 50 mg daily, and labetalol 200 mg 3 times daily Permissive hypertension <220/120 for 24-48 hours and then gradually normalize within 5-7  days Patient counseled to be compliant with his blood pressure medications    Hyperlipidemia  Home meds:  Lipitor 80 mg daily resumed in hospital  LDL 169 goal < 70  Confirm compliance with Lipitor  Continue statin at discharge  Diabetes  HgbA1c goal < 7.0  Controlled  Other Stroke Risk Factors  Advanced age  Cigarette smoker, quit smoking 44 years ago.  Obesity, Body mass index is 48.2 kg/(m^2).   Hx stroke/TIA  Obstructive sleep apnea   Other Active Problems  Mild anemia  Other Pertinent History    Hospital day # 2  Dr Merlene Laughter - The patient was seen and examined by me; notes, chart and tests reviewed and discussed with midlevel provider, other providers, patient, and family.       To contact Stroke Continuity provider, please refer to [email or web address] After hours, contact General Neurology

## 2014-12-04 NOTE — Progress Notes (Signed)
  Echocardiogram 2D Echocardiogram has been performed.  Donata Clay 12/04/2014, 2:04 PM

## 2014-12-04 NOTE — Progress Notes (Signed)
PROGRESS NOTE  Blake Ina Sr. QIH:[id number] DOB: 1951-03-27 DOA: 12/02/2014 PCP: Roselee Culver, MD  Assessment/Plan: Acute ischemic stroke MRI brain showing acute nonhemorrhagic left sided infarct.  -MRA brain:Mild to moderate stenosis proximal right M1 segment. Otherwise no significant intracranial stenosis.  -2-D echo -carotid Doppler: Bilateral: 1-39% ICA stenosis. Vertebral artery flow is antegrade -A1c and lipid panel: LDL 169- on lipitor 80 at home -PT, OT evaluation- 24 hour supervision at home--- wife works, may need SNF Plavix  -Appreciate neurology recommendations.   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  -baseline around 2.1-2.3   OSA (obstructive sleep apnea) Continue bedtime CPAP  Uncontrolled type 2 diabetes mellitus, with nephropathy Patient not taking any medication for the past 4 months. Would place on sliding scale insulin every 4 hours   Check A1c.   Essential hypertension, benign On multiple blood pressure medications. (Lasix, biotin, labetalol,azilsartan). Holding Lasix and ARB due to acute kidney injury. Allow permissive blood pressure.   HLD (hyperlipidemia) Continue statin.  hyperprolactinoma continue cabergoline twice weekly.  Code Status: full Family Communication: wife at bedside 6/18 Disposition Plan:    Consultants:  neuro  Procedures:     HPI/Subjective: Sitting in chair, doing well, no SOB, no CP  Objective: Filed Vitals:   12/04/14 0700  BP: 178/98  Pulse: 67  Temp: 98.1 F (36.7 C)  Resp: 20    Intake/Output Summary (Last 24 hours) at 12/04/14 0847 Last data filed at 12/03/14 1000  Gross per 24 hour  Intake      0 ml  Output    200 ml  Net   -200 ml   Filed Weights   12/02/14 1812  Weight: 150.141 kg (331 lb)    Exam:   General:  Pleasant/cooperative  Cardiovascular: rrr  Respiratory: no wheezing  Abdomen: +Bs, soft  Data  Reviewed: Basic Metabolic Panel:  Recent Labs Lab 12/02/14 1036 12/03/14 0545  NA 139 140  K 3.9 3.5  CL 107 105  CO2 24 26  GLUCOSE 177* 123*  BUN 27* 22*  CREATININE 3.16* 2.86*  CALCIUM 8.7* 9.0   Liver Function Tests: No results for input(s): AST, ALT, ALKPHOS, BILITOT, PROT, ALBUMIN in the last 168 hours. No results for input(s): LIPASE, AMYLASE in the last 168 hours. No results for input(s): AMMONIA in the last 168 hours. CBC:  Recent Labs Lab 12/02/14 1036  WBC 9.9  NEUTROABS 8.3*  HGB 11.7*  HCT 36.2*  MCV 87.4  PLT 354   Cardiac Enzymes:  Recent Labs Lab 12/02/14 1036  TROPONINI <0.03   BNP (last 3 results) No results for input(s): BNP in the last 8760 hours.  ProBNP (last 3 results) No results for input(s): PROBNP in the last 8760 hours.  CBG:  Recent Labs Lab 12/03/14 0817 12/03/14 1112 12/03/14 1638 12/03/14 2058 12/04/14 0716  GLUCAP 123* 130* 221* 142* 128*    Recent Results (from the past 240 hour(s))  MRSA PCR Screening     Status: Abnormal   Collection Time: 12/02/14  9:41 PM  Result Value Ref Range Status   MRSA by PCR POSITIVE (A) NEGATIVE Final    Comment:        The GeneXpert MRSA Assay (FDA approved for NASAL specimens only), is one component of a comprehensive MRSA colonization surveillance program. It is not intended to diagnose MRSA infection nor to guide or monitor treatment for MRSA infections. RESULT CALLED TO, READ BACK BY AND VERIFIED WITH: A THOMPSON,RN  [place]      Studies: [name]  12/02/2014   CLINICAL DATA:  Aphasia  EXAM: MRI HEAD WITHOUT CONTRAST  TECHNIQUE: Multiplanar, multiecho pulse sequences of the brain and surrounding structures were obtained without intravenous contrast.  COMPARISON:  01/13/2014 brain MRI  FINDINGS: Calvarium and upper cervical spine: No focal marrow signal abnormality.  Orbits: No significant findings.  Sinuses and Mastoids: Clear. Mastoid and middle  ears are clear.  Brain: Restricted diffusion in the left upper putamen in the corona radiata consistent with acute perforator infarct. This is anterior to the acute infarct seen in 2015. Additionally, there has been a remote hemorrhagic infarct in the central left thalamus which has occurred since prior. Chronic vessel disease which is extensive for age, with confluent T2 and FLAIR hyperintensity around the lateral ventricles and an additional lacunar infarct in the right corona radiata. Chronic ischemic change is present throughout the pons. 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 No evidence of major vessel occlusion, acute hemorrhage, or obstructive hydrocephalus.  IMPRESSION: 1. Acute, nonhemorrhagic perforator infarct affecting the left upper putamen and corona radiata. 2. 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 A lacunar infarct in the left thalamus is chronic but has occurred since most recent imaging 01/13/2014.   Electronically Signed   By: Monte Fantasia M.D.   On: 12/02/2014 16:15   [name]  12/03/2014   CLINICAL DATA:  Stroke  EXAM: MRA HEAD WITHOUT CONTRAST  TECHNIQUE: Angiographic images of the Circle of Willis were obtained using MRA technique without intravenous contrast.  COMPARISON:  MRI head 12/02/2014  FINDINGS: Right vertebral dominant and widely patent to the basilar. Right PICA patent. Left vertebral artery ends in PICA and does not contribute to the basilar. The basilar is tortuous and indents the left side of the pons. No basilar stenosis. Superior cerebellar artery patent bilaterally. Fetal origin of the right posterior cerebral artery with hypoplastic right P1 segment. Posterior cerebral arteries patent bilaterally without significant stenosis  Cavernous carotid widely patent bilaterally without stenosis. Anterior cerebral arteries patent bilaterally. Mild to moderate stenosis proximal right M1 segment. Right middle cerebral artery branches  widely patent. Left middle cerebral artery widely patent.  Negative for cerebral aneurysm.  IMPRESSION: Mild to moderate stenosis proximal right M1 segment. Otherwise no significant intracranial stenosis.   Electronically Signed   By: Franchot Gallo M.D.   On: 12/03/2014 08:46    Scheduled Meds: . amLODipine  100 mg Oral Daily  . atorvastatin  80 mg Oral q1800  . [START ON 12/05/2014] cabergoline  0.25 mg Oral Once per day on Mon Thu  . Chlorhexidine Gluconate Cloth  6 each Topical Q0600  . clopidogrel  75 mg Oral Q breakfast  . escitalopram  40 mg Oral Daily  . heparin  5,000 Units Subcutaneous 3 times per day  . insulin aspart  0-15 Units Subcutaneous TID WC  . insulin aspart  0-5 Units Subcutaneous QHS  . isosorbide-hydrALAZINE  2 tablet Oral TID  . labetalol  200 mg Oral TID  . mupirocin ointment  1 application Nasal BID   Continuous Infusions:   Antibiotics Given (last 72 hours)    None      Principal Problem:   Acute ischemic stroke Active Problems:   OSA (obstructive sleep apnea)   Morbid obesity   CVA (cerebral infarction)   Type 2 diabetes mellitus with complication   Essential hypertension, benign   HLD (hyperlipidemia)   Acute renal  failure superimposed on stage 3 chronic kidney disease   Acute ischemic left MCA stroke    Time spent: 25 min    [facility], JESSICA  [facility] Pager [phone]. If 7PM-7AM, please contact night-coverage at [email or web address], password [facility] 12/04/2014, 8:47 AM  LOS: 2 days

## 2014-12-04 NOTE — Evaluation (Signed)
Occupational Therapy Evaluation Patient Details Name: Xachary Hambly Sr. MRN: [id number] DOB: Aug 22, 1950 Today's Date: 12/04/2014    History of Present Illness Patient is a 64 yo male admitted 12/02/14 with difficulty with speech and confusion.  MRI:  Acute, nonhemorrhagic perforator infarct affecting the left upper putamen and corona radiata.  PMH:  CVA  01/13/14 with expressive aphasia, Obesity, HTN, OSA on CPAP, DM, CKD   Clinical Impression   Pt admitted with above. Pt independent with ADLs, PTA (per pt and son report). Feel pt will benefit from acute OT to increase independence and safety prior to d/c. Feel pt will fine to d/c home with 24/7 supervision/assist. If family unable to provide, may have to consider SNF.    Follow Up Recommendations  Home health OT;Supervision/Assistance - 24 hour    Equipment Recommendations  None recommended by OT    Recommendations for Other Services       Precautions / Restrictions Precautions Precautions: Fall Restrictions Weight Bearing Restrictions: No      Mobility Bed Mobility               General bed mobility comments: not assessed  Transfers Overall transfer level: Needs assistance Equipment used: Rolling walker (2 wheeled) Transfers: Sit to/from Stand Sit to Stand: Supervision         General transfer comment: cue for hand placement; pt pulled up on walker    Balance  Min guard for ambulation with RW. No LOB in session.                                           ADL Overall ADL's : Needs assistance/impaired     Grooming: Wash/dry face;Set up;Sitting               Lower Body Dressing: Min guard;Sit to/from stand   Toilet Transfer: Min guard;Ambulation;RW (chair)           Functional mobility during ADLs: Min guard;Rolling walker General ADL Comments: Spoke with son for reasoning of recommending 24/7 supervision at home.      Vision Pt reports vision is now back to  baseline Vision Assessment?: Yes Visual Fields: No apparent deficits   Perception     Praxis      Pertinent Vitals/Pain Pain Assessment: No/denies pain     Hand Dominance Right   Extremity/Trunk Assessment Upper Extremity Assessment Upper Extremity Assessment: RUE deficits/detail RUE Deficits / Details: seemed slightly weaker with shoulder flexion than LUE; unsure if residual from previous CVA RUE Coordination: decreased fine motor   Lower Extremity Assessment Lower Extremity Assessment: Defer to PT evaluation       Communication Communication Communication: Expressive difficulties   Cognition Arousal/Alertness: Awake/alert Behavior During Therapy: WFL for tasks assessed/performed Overall Cognitive Status: unsure of baseline cognition Area of Impairment: Orientation;Safety/judgement;Problem solving Orientation Level: Disoriented to;Time;Place       Safety/Judgement: Decreased awareness of safety   Problem Solving: Slow processing General Comments: Pt unable to state that he would call 911 if there was a fire in his house.    General Comments       Exercises       Shoulder Instructions      Home Living Family/patient expects to be discharged to:: Private residence Living Arrangements: Spouse/significant other Available Help at Discharge: Family Type of Home: House Home Access: Level entry     Home Layout: One level  Bathroom Shower/Tub: Teacher, early years/pre: Standard     Home Equipment: Environmental consultant - 2 wheels;Cane - single point;Electric scooter;Bedside commode;Tub bench (bed with elevating HOB)          Prior Functioning/Environment Level of Independence: Independent with assistive device(s)  Gait / Transfers Assistance Needed: Uses cane for ambulation, or electric scooter ADL's / Homemaking Assistance Needed: pt/son report independent with ADLs        OT Diagnosis: Generalized weakness;Altered mental status   OT Problem List:  Decreased strength;Decreased knowledge of precautions;Decreased knowledge of use of DME or AE;Decreased cognition;Decreased coordination;Decreased safety awareness   OT Treatment/Interventions: Self-care/ADL training;Therapeutic exercise;DME and/or AE instruction;Therapeutic activities;Cognitive remediation/compensation;Patient/family education;Balance training    OT Goals(Current goals can be found in the care plan section) Acute Rehab OT Goals Patient Stated Goal: not stated OT Goal Formulation: With patient Time For Goal Achievement: 12/11/14 Potential to Achieve Goals: Good ADL Goals Pt Will Perform Lower Body Bathing: with supervision (including gathering supplies) Pt Will Perform Lower Body Dressing: with supervision (including gathering supplies) Pt Will Transfer to Toilet: ambulating;with supervision  OT Frequency: Min 2X/week   Barriers to D/C:            Co-evaluation              End of Session Equipment Utilized During Treatment: Gait belt;Rolling walker Nurse Communication: Mobility status;Other (comment) (d/c recommendation; cognition)  Activity Tolerance: Patient tolerated treatment well Patient left: in chair;with call bell/phone within reach;with chair alarm set;with family/visitor present   Time: 3383-2919 OT Time Calculation (min): 17 min Charges:  OT General Charges $OT Visit: 1 Procedure OT Evaluation $Initial OT Evaluation Tier I: 1 Procedure G-CodesBenito Mccreedy OTR/L [id number] 12/04/2014, 1:12 PM

## 2014-12-05 LAB — BASIC METABOLIC PANEL
Anion gap: 9 (ref 5–15)
BUN: 26 mg/dL — AB (ref 6–20)
CHLORIDE: 104 mmol/L (ref 101–111)
CO2: 25 mmol/L (ref 22–32)
Calcium: 8.5 mg/dL — ABNORMAL LOW (ref 8.9–10.3)
Creatinine, Ser: 3.39 mg/dL — ABNORMAL HIGH (ref 0.61–1.24)
GFR calc Af Amer: 21 mL/min — ABNORMAL LOW (ref >60)
GFR calc non Af Amer: 18 mL/min — ABNORMAL LOW (ref >60)
GLUCOSE: 131 mg/dL — AB (ref 65–99)
POTASSIUM: 3.4 mmol/L — AB (ref 3.5–5.1)
Sodium: 138 mmol/L (ref 135–145)

## 2014-12-05 LAB — CBC
HEMATOCRIT: 35 % — AB (ref 39.0–52.0)
HEMOGLOBIN: 11.3 g/dL — AB (ref 13.0–17.0)
MCH: 27.8 pg (ref 26.0–34.0)
MCHC: 32.3 g/dL (ref 30.0–36.0)
MCV: 86.2 fL (ref 78.0–100.0)
Platelets: 343 10*3/uL (ref 150–400)
RBC: 4.06 MIL/uL — AB (ref 4.22–5.81)
RDW: 18 % — ABNORMAL HIGH (ref 11.5–15.5)
WBC: 7.5 10*3/uL (ref 4.0–10.5)

## 2014-12-05 LAB — GLUCOSE, CAPILLARY
GLUCOSE-CAPILLARY: 144 mg/dL — AB (ref 65–99)
Glucose-Capillary: 136 mg/dL — ABNORMAL HIGH (ref 65–99)
Glucose-Capillary: 148 mg/dL — ABNORMAL HIGH (ref 65–99)
Glucose-Capillary: 119 mg/dL — ABNORMAL HIGH (ref 65–99)

## 2014-12-05 MED ORDER — POTASSIUM CHLORIDE CRYS ER 20 MEQ PO TBCR
40.0000 meq | EXTENDED_RELEASE_TABLET | Freq: Once | ORAL | Status: AC
  Administered 2014-12-05: 40 meq via ORAL
  Filled 2014-12-05: qty 2

## 2014-12-05 MED ORDER — FUROSEMIDE 80 MG PO TABS
80.0000 mg | ORAL_TABLET | Freq: Every day | ORAL | Status: DC
  Administered 2014-12-05 – 2014-12-06 (×2): 80 mg via ORAL
  Filled 2014-12-05 (×2): qty 1

## 2014-12-05 NOTE — Evaluation (Signed)
Speech Language Pathology Evaluation Patient Details Name: Blake Pisarski Sr. MRN: [id number] DOB: 1950-10-27 Today's Date: 12/05/2014 Time: 2130-8657 SLP Time Calculation (min) (ACUTE ONLY): 36 min  Problem List:  Patient Active Problem List   Diagnosis Date Noted  . Acute ischemic stroke 12/02/2014  . Acute renal failure superimposed on stage 3 chronic kidney disease 12/02/2014  . Acute ischemic left MCA stroke 12/02/2014  . Stroke 03/24/2014  . Type 2 diabetes mellitus with complication 84/69/6295  . Essential hypertension, benign 03/24/2014  . HLD (hyperlipidemia) 03/24/2014  . CVA (cerebral infarction) 01/13/2014  . AKI (acute kidney injury) 07/03/2013  . Hypernatremia 07/03/2013  . Morbid obesity 07/03/2013  . Venous stasis ulcers 07/03/2013  . DM (diabetes mellitus) type 2, uncontrolled, with ketoacidosis 07/03/2013  . 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 Sellar or suprasellar mass 06/22/2013  . CKD (chronic kidney disease) stage 3, GFR 30-59 ml/min 06/22/2013  . Acute respiratory failure 06/22/2013  . Seizure 06/21/2013  . Altered mental status 06/21/2013  . DKA (diabetic ketoacidoses) 06/21/2013  . Hypercarbia 06/21/2013  . OSA (obstructive sleep apnea) 05/25/2013   Past Medical History:  Past Medical History  Diagnosis Date  . Hypertension   . Multiple wounds     on lower legs - sees [name] Pain at the [facility]  . Hypercholesterolemia   . OSA on CPAP     uses cpap, setting of 5  . Prolactin secreting pituitary adenoma     [name] 12/02/2014  . Type II diabetes mellitus     uncontrolled/notes 12/02/2014  . Chronic kidney disease (CKD), stage III (moderate)     sees dr Florene Glen nephrology every 6-9 months  . CVA (cerebral vascular accident) 12/2013    admitted in July 2015 for acute right-sided infarct/notes 12/02/2014  . Stroke 12/02/2014    expressive aphasia   Past Surgical History:  Past Surgical History  Procedure Laterality Date  . Colonoscopy  6-7  yrs ago  . Colonoscopy N/A 04/05/2013    Procedure: COLONOSCOPY;  Surgeon: Juanita Craver, MD;  Location: [facility];  Service: Endoscopy;  Laterality: N/A;   HPI:  64 year old morbidly obese male with history of CVA (admitted in July 2015 for acute right-sided infarct),.hypertension, sleep apnea on nightly CPAP, pituitary prolactinoma, chronic kidney disease stage III (see [name] , baseline creatinine of 2.5), venous stasis ulcer, uncontrolled type 2 diabetes mellitus (not taking any medications for the past 4 months,? Due to cost) who was brought to the ED by his wife for acute onset of aphasia since last evening. Patient was at home watching TV and when wife called him from work he had significant word finding difficulty and was representing words.   Assessment / Plan / Recommendation Clinical Impression  Pt presents with a mild to moderate expressive aphasia and mild receptive deficits. Pt able to understand most conversation level speech, but struggles with complex commands. Pt also able to express basic needs and participate in conversation, but demonstrates long pauses for word finding and circumlocution. Reading and writing are also moderately impaired. Recomend pt f/u with home health SLP to maximize functional communication as pt is stimulable to cues and shows good potential for recovery.     SLP Assessment  Patient needs continued Speech Lanaguage Pathology Services    Follow Up Recommendations  Outpatient SLP;Home health SLP    Frequency and Duration min 2x/week  2 weeks   Pertinent Vitals/Pain     SLP Goals  Potential to Achieve Goals (ACUTE ONLY): Good  SLP  Evaluation Prior Functioning  Cognitive/Linguistic Baseline: Baseline deficits (pt reprots being seen by SLP previously)  Lives With: Spouse Available Help at Discharge: Family Vocation: Retired   Associate Professor  Overall Cognitive Status: Impaired/Different from baseline Arousal/Alertness: Awake/alert Orientation  Level: Oriented to person;Oriented to situation;Oriented to time Attention: Focused;Sustained;Selective;Alternating Focused Attention: Appears intact Sustained Attention: Appears intact Selective Attention: Appears intact Alternating Attention: Appears intact Problem Solving: Impaired Problem Solving Impairment: Functional complex;Verbal complex Safety/Judgment: Impaired    Comprehension  Auditory Comprehension Overall Auditory Comprehension: Impaired Yes/No Questions: Within Functional Limits Commands: Impaired Multistep Basic Commands: 75-100% accurate Complex Commands: 25-49% accurate Conversation: Simple EffectiveTechniques: Repetition Reading Comprehension Reading Status: Impaired Word level: Within functional limits Sentence Level: Impaired Paragraph Level: Impaired Effective Techniques: Verbal cueing    Expression Expression Primary Mode of Expression: Verbal Verbal Expression Overall Verbal Expression: Impaired Initiation: No impairment Automatic Speech: Name;Social Response Level of Generative/Spontaneous Verbalization: Conversation Repetition: No impairment Naming: Impairment Responsive: 76-100% accurate Confrontation: Impaired Convergent: 75-100% accurate Divergent: Not tested Verbal Errors: Aware of errors Pragmatics: No impairment Effective Techniques: Semantic cues;Phonemic cues;Open ended questions Written Expression Dominant Hand: Right Written Expression: Exceptions to [facility] Dictation Ability: Word Self Formulation Ability: Letter   Oral / Motor Oral Motor/Sensory Function Overall Oral Motor/Sensory Function: Impaired Labial ROM: Within Functional Limits Labial Symmetry: Within Functional Limits Labial Strength: Within Functional Limits Labial Sensation: Within Functional Limits Lingual ROM: Reduced right;Reduced left Lingual Symmetry: Within Functional Limits Lingual Strength: Reduced Lingual Sensation: Within Functional Limits Facial ROM:  Within Functional Limits Facial Symmetry: Within Functional Limits Facial Strength: Within Functional Limits Facial Sensation: Within Functional Limits Velum: Within Functional Limits Mandible: Within Functional Limits Motor Speech Overall Motor Speech: Appears within functional limits for tasks assessed   GO     Eliza Grissinger, Katherene Ponto 12/05/2014, 3:03 PM

## 2014-12-05 NOTE — Clinical Social Work Note (Addendum)
CSW Consult Acknowledged:   CSW received a consult for SNF placement. CSW spoke with the pt's wife Leda Gauze.  Leda Gauze reported needing time to think about SNF. Leda Gauze informed the CSW that she is working today and she will call the CSW at noon, after she talk to her son.   Addendum: CSW attempted to call Leda Gauze to follow up with her about SNF placement. Leda Gauze was unavailable. CSW left a message for [place]. CSW attempted to call pt's son Junior at [phone] however CSW received a message indicating the caller can not receive calls at this time. CSW informed MD.   Greta Doom, MSW, [place]

## 2014-12-05 NOTE — Progress Notes (Signed)
STROKE TEAM PROGRESS NOTE   HISTORY Blake Massman Sr. is a 64 y.o. male with a history of hypertension, chronic kidney disease, diabetes mellitus, obstructive sleep apnea, and previous stroke, who presented to the emergency department today for further evaluation of speech difficulties. An MRI from July 2015 revealed an acute ischemic infarct involving the right putamen with superior extension into the right corona radiata.  The patient's wife reported that she phoned the patient at approximately 9 PM last night before returning home from work. She noticed that the patient was having significant word finding difficulties. The deficits persisted today and he was brought to the emergency department for further evaluation. The patient and his wife deny any recent speech difficulties until last night. He has experienced a congested cough recently and his wife felt he was having some difficulty with his swallowing. His speech difficulties have improved since arrival to the emergency department today. The patient takes an aspirin daily and denies any recent missed doses.   Date last known well: Date: 12/01/2014 Time last known well: Unable to determine tPA Given: No: Late presentation and unknown time of onset.   SUBJECTIVE (INTERVAL HISTORY) Overall he feels his condition is better. No new complaints OBJECTIVE Temp:  [97.5 F (36.4 C)-98.8 F (37.1 C)] 98.7 F (37.1 C) (06/20 1454) Pulse Rate:  [64-86] 77 (06/20 1454) Cardiac Rhythm:  [-] Normal sinus rhythm (06/20 0800) Resp:  [18-22] 20 (06/20 1454) BP: (100-178)/(71-97) 162/85 mmHg (06/20 1454) SpO2:  [97 %-100 %] 100 % (06/20 1454)   Recent Labs Lab 12/04/14 1208 12/04/14 1635 12/04/14 2104 12/05/14 0718 12/05/14 1130  GLUCAP 131* 116* 145* 136* 148*    Recent Labs Lab 12/02/14 1036 12/03/14 0545 12/05/14 0609  NA 139 140 138  K 3.9 3.5 3.4*  CL 107 105 104  CO2 24 26 25   GLUCOSE 177* 123* 131*  BUN 27* 22* 26*   CREATININE 3.16* 2.86* 3.39*  CALCIUM 8.7* 9.0 8.5*   No results for input(s): AST, ALT, ALKPHOS, BILITOT, PROT, ALBUMIN in the last 168 hours.  Recent Labs Lab 12/02/14 1036 12/05/14 0609  WBC 9.9 7.5  NEUTROABS 8.3*  --   HGB 11.7* 11.3*  HCT 36.2* 35.0*  MCV 87.4 86.2  PLT 354 343    Recent Labs Lab 12/02/14 1036  TROPONINI <0.03   No results for input(s): LABPROT, INR in the last 72 hours. No results for input(s): COLORURINE, LABSPEC, [place], GLUCOSEU, HGBUR, BILIRUBINUR, KETONESUR, PROTEINUR, UROBILINOGEN, NITRITE, LEUKOCYTESUR in the last 72 hours.  Invalid input(s): APPERANCEUR     Component Value Date/Time   CHOL 243* 12/03/2014 0545   TRIG 113 12/03/2014 0545   HDL 51 12/03/2014 0545   CHOLHDL 4.8 12/03/2014 0545   VLDL 23 12/03/2014 0545   LDLCALC 169* 12/03/2014 0545   Lab Results  Component Value Date   HGBA1C 9.9* 01/14/2014      Component Value Date/Time   LABOPIA NONE DETECTED 01/13/2014 2036   COCAINSCRNUR NONE DETECTED 01/13/2014 2036   LABBENZ NONE DETECTED 01/13/2014 2036   AMPHETMU NONE DETECTED 01/13/2014 2036   THCU NONE DETECTED 01/13/2014 2036   LABBARB NONE DETECTED 01/13/2014 2036    No results for input(s): ETH in the last 168 hours.  [name] 12/02/2014    1. Acute, nonhemorrhagic perforator infarct affecting the left upper putamen and corona radiata.  2. 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 A lacunar infarct in the left thalamus is chronic but has occurred since most recent  imaging 01/13/2014.      Mr Blake Bell Head/brain Wo Cm 12/03/2014    Mild to moderate stenosis proximal right M1 segment. Otherwise no significant intracranial stenosis.       PHYSICAL EXAM  GENERAL: Obese middle-aged African-American male. No distress.   HEENT: Large neck  ABDOMEN: soft  EXTREMITIES: No edema   BACK: normal  SKIN: Normal by inspection.    MENTAL STATUS: Alert. Speech - mild dysathria, language - some difficulty  with comprehension; naming good got 5/5. Judgment and insight somewhat limited.   CRANIAL NERVES: Pupils are equal, round and reactive to light and accomodation; extra ocular movements are full, there is no significant nystagmus; visual fields are full; upper and lower facial muscles are normal in strength and symmetric, there flattening of the nasolabial fold-R; tongue is midline; uvula is midline; shoulder elevation is normal.  MOTOR: Normal tone, bulk and strength; no pronator drift. Diminished fine finger movements on the right. Minimum weakness of right grip only.  COORDINATION: Left finger to nose is normal, right finger to nose is normal, No rest tremor; no intention tremor; no postural tremor; no bradykinesia   SENSATION: Normal to light touch and temperature.   GAIT : deferred          ASSESSMENT/PLAN Mr. Blake Steinberger Sr. is a 64 y.o. male with history of hypertension, chronic kidney disease, diabetes mellitus, obstructive sleep apnea, and previous stroke presenting with speech difficulties. He did not receive IV t-PA due to late presentation and resolving deficits.  Stroke:  Dominant acute, nonhemorrhagic perforator infarct secondary to small vessel disease.  Resultant  dysarthria  MRI  - acute, nonhemorrhagic perforator infarct affecting the left upper putamen and corona radiata.   MRA - Mild to moderate stenosis proximal right M1 segment. Carotid Doppler - Bilateral: 1-39% ICA stenosis. Vertebral artery flow is antegrade.     2D Echo -Left ventricle: The cavity size was normal. There was moderate concentric hypertrophy. Systolic function was normal. The estimated ejection fraction was in the range of 60% to 65%. - Left atrium: The atrium was mildly dilated.  LDL 169  HgbA1c pending  Additional labs pending.   Subcutaneous heparin for VTE prophylaxis Diet heart healthy/carb modified Room service appropriate?: Yes; Fluid consistency::  Thin  aspirin 325 mg orally every day prior to admission, now on clopidogrel 75 mg orally every day  Patient counseled to be compliant with his antithrombotic medications  Ongoing aggressive stroke risk factor management  Therapy recommendations: Home health physical therapy recommended Disposition: Home  Hypertension  Home meds:  Norvasc 10 mg daily, Cozaar 50 mg daily, and labetalol 200 mg 3 times daily Permissive hypertension <220/120 for 24-48 hours and then gradually normalize within 5-7 days Patient counseled to be compliant with his blood pressure medications    Hyperlipidemia  Home meds:  Lipitor 80 mg daily resumed in hospital  LDL 169 goal < 70  Confirm compliance with Lipitor  Continue statin at discharge  Diabetes  HgbA1c goal < 7.0  Controlled  Other Stroke Risk Factors  Advanced age  Cigarette smoker, quit smoking 44 years ago.  Obesity, Body mass index is 48.2 kg/(m^2).   Hx stroke/TIA  Obstructive sleep apnea   Other Active Problems  Mild anemia  Other Pertinent History    Hospital day # 3 Continue Plavix for stroke prevention and strict control of lipids with LDL goal below 70 mg percent and the compliant with Lipitor as well as diabetes with hemoglobin A1c goal below 6.5%.  Patient was found also consult to diet, exercise and lose weight. Follow-up as an outpatient in the stroke clinic in 2 months. Stroke team will sign off. Kindly call for questions. Antony Contras, MD    To contact Stroke Continuity provider, please refer to [email or web address] After hours, contact General Neurology

## 2014-12-05 NOTE — Progress Notes (Signed)
Utilization Review Completed.Blake Bell T6/20/2016  

## 2014-12-05 NOTE — Progress Notes (Signed)
Physical Therapy Treatment Patient Details Name: Blake Bell. MRN: [id number] DOB: March 20, 1951 Today's Date: 12/05/2014    History of Present Illness Patient is a 64 yo male admitted 12/02/14 with difficulty with speech and confusion.  MRI:  Acute, nonhemorrhagic perforator infarct affecting the left upper putamen and corona radiata.  PMH:  CVA  01/13/14 with expressive aphasia, Obesity, HTN, OSA on CPAP, DM, CKD    PT Comments    Pt making progress with mobility, but continue to feel pt will need 24hr support at home.  Noted SW having difficulty contacting family to discuss family's ability to provide 24hr care vs need for SNF.  If pt unable to receive 24hr care at home, feel SNF is safest D/C option for pt.    Follow Up Recommendations  Home health PT;Supervision/Assistance - 24 hour     Equipment Recommendations  None recommended by PT    Recommendations for Other Services       Precautions / Restrictions Precautions Precautions: Fall Restrictions Weight Bearing Restrictions: No    Mobility  Bed Mobility               General bed mobility comments: pt sitting in recliner.  Transfers Overall transfer level: Needs assistance Equipment used: Rolling walker (2 wheeled) Transfers: Sit to/from Stand Sit to Stand: Min guard         General transfer comment: Cues for UE use and controlling descent to sitting.  Repeated coming to stand x5 for strengthening and repetition of safe technique.    Ambulation/Gait Ambulation/Gait assistance: Min assist Ambulation Distance (Feet): 65 Feet Assistive device: Rolling walker (2 wheeled) Gait Pattern/deviations: Step-through pattern;Decreased stride length;Trunk flexed;Narrow base of support     General Gait Details: cues for positioning within RW, upright posture.  pt leans heavily on RW.     Stairs            Wheelchair Mobility    Modified Rankin (Stroke Patients Only) Modified Rankin (Stroke Patients  Only) Pre-Morbid Rankin Score: Moderate disability Modified Rankin: Moderately severe disability     Balance Overall balance assessment: Needs assistance         Standing balance support: Bilateral upper extremity supported;During functional activity Standing balance-Leahy Scale: Poor                      Cognition Arousal/Alertness: Awake/alert Behavior During Therapy: WFL for tasks assessed/performed Overall Cognitive Status: Impaired/Different from baseline Area of Impairment: Orientation;Safety/judgement;Awareness;Problem solving Orientation Level: Disoriented to;Time       Safety/Judgement: Decreased awareness of deficits;Decreased awareness of safety Awareness: Emergent Problem Solving: Slow processing;Difficulty sequencing;Requires verbal cues;Requires tactile cues      Exercises      General Comments        Pertinent Vitals/Pain Pain Assessment: No/denies pain    Home Living     Available Help at Discharge: Family                Prior Function            PT Goals (current goals can now be found in the care plan section) Acute Rehab PT Goals Patient Stated Goal: not stated PT Goal Formulation: With patient Time For Goal Achievement: 12/10/14 Potential to Achieve Goals: Good Progress towards PT goals: Progressing toward goals    Frequency  Min 4X/week    PT Plan Current plan remains appropriate    Co-evaluation             End of  Session Equipment Utilized During Treatment: Gait belt Activity Tolerance: Patient limited by fatigue Patient left: in chair;with call bell/phone within reach;with chair alarm set     Time: 2707-8675 PT Time Calculation (min) (ACUTE ONLY): 25 min  Charges:  $Gait Training: 23-37 mins                    G CodesCatarina Hartshorn, [place] 12/05/2014, 3:25 PM

## 2014-12-05 NOTE — Progress Notes (Signed)
PROGRESS NOTE  Blake Ina Sr. ZOX:[id number] DOB: May 04, 1951 DOA: 12/02/2014 PCP: Roselee Culver, MD  Assessment/Plan: Acute ischemic stroke MRI brain showing acute nonhemorrhagic left sided infarct.  -MRA brain:Mild to moderate stenosis proximal right M1 segment. Otherwise no significant intracranial stenosis.  -2-D echo: Very limited study due to ppor sound wave tgransmission. LV appears normal. RV is dilated but function appears low normal. -carotid Doppler: Bilateral: 1-39% ICA stenosis. Vertebral artery flow is antegrade -A1c and lipid panel: LDL 169- on lipitor 80 at home -PT, OT evaluation- 24 hour supervision at home--- wife works, may need SNF Plavix  -Appreciate neurology recommendations.   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  -baseline around 2.1-2.3 -restart lasix appears to be volume overloaded   OSA (obstructive sleep apnea) Continue bedtime CPAP  Uncontrolled type 2 diabetes mellitus, with nephropathy SSI  Check A1c.   Essential hypertension, benign On multiple blood pressure medications. (Lasix, biotin, labetalol,azilsartan). Holding ARB due to acute kidney injury   HLD (hyperlipidemia) Continue statin.  hyperprolactinoma continue cabergoline twice weekly.  Code Status: full Family Communication: wife at bedside 6/18 Disposition Plan:    Consultants:  neuro  Procedures:     HPI/Subjective: Sitting in chair, has some word finding difficulties  Objective: Filed Vitals:   12/05/14 0923  BP: 158/92  Pulse: 72  Temp: 98 F (36.7 C)  Resp: 20    Intake/Output Summary (Last 24 hours) at 12/05/14 1044 Last data filed at 12/04/14 1837  Gross per 24 hour  Intake    240 ml  Output      0 ml  Net    240 ml   Filed Weights   12/02/14 1812  Weight: 150.141 kg (331 lb)    Exam:   General:  Pleasant/cooperative  Cardiovascular: rrr  Respiratory: no wheezing  Abdomen:  +Bs, soft  +edema LE  Data Reviewed: Basic Metabolic Panel:  Recent Labs Lab 12/02/14 1036 12/03/14 0545 12/05/14 0609  NA 139 140 138  K 3.9 3.5 3.4*  CL 107 105 104  CO2 24 26 25   GLUCOSE 177* 123* 131*  BUN 27* 22* 26*  CREATININE 3.16* 2.86* 3.39*  CALCIUM 8.7* 9.0 8.5*   Liver Function Tests: No results for input(s): AST, ALT, ALKPHOS, BILITOT, PROT, ALBUMIN in the last 168 hours. No results for input(s): LIPASE, AMYLASE in the last 168 hours. No results for input(s): AMMONIA in the last 168 hours. CBC:  Recent Labs Lab 12/02/14 1036 12/05/14 0609  WBC 9.9 7.5  NEUTROABS 8.3*  --   HGB 11.7* 11.3*  HCT 36.2* 35.0*  MCV 87.4 86.2  PLT 354 343   Cardiac Enzymes:  Recent Labs Lab 12/02/14 1036  TROPONINI <0.03   BNP (last 3 results) No results for input(s): BNP in the last 8760 hours.  ProBNP (last 3 results) No results for input(s): PROBNP in the last 8760 hours.  CBG:  Recent Labs Lab 12/04/14 0716 12/04/14 1208 12/04/14 1635 12/04/14 2104 12/05/14 0718  GLUCAP 128* 131* 116* 145* 136*    Recent Results (from the past 240 hour(s))  MRSA PCR Screening     Status: Abnormal   Collection Time: 12/02/14  9:41 PM  Result Value Ref Range Status   MRSA by PCR POSITIVE (A) NEGATIVE Final    Comment:        The GeneXpert MRSA Assay (FDA approved for NASAL specimens only), is one component of a comprehensive MRSA colonization surveillance program. It is not  intended to diagnose MRSA infection nor to guide or monitor treatment for MRSA infections. RESULT CALLED TO, READ BACK BY AND VERIFIED WITH: A THOMPSON,RN [phone] WILDERK      Studies: No results found.  Scheduled Meds: . amLODipine  10 mg Oral Daily  . atorvastatin  80 mg Oral q1800  . cabergoline  0.25 mg Oral Once per day on Mon Thu  . Chlorhexidine Gluconate Cloth  6 each Topical Q0600  . clopidogrel  75 mg Oral Q breakfast  . escitalopram  40 mg Oral Daily  . furosemide   80 mg Oral Daily  . heparin  5,000 Units Subcutaneous 3 times per day  . insulin aspart  0-15 Units Subcutaneous TID WC  . insulin aspart  0-5 Units Subcutaneous QHS  . isosorbide-hydrALAZINE  2 tablet Oral TID  . labetalol  200 mg Oral TID  . mupirocin ointment  1 application Nasal BID   Continuous Infusions:   Antibiotics Given (last 72 hours)    None      Principal Problem:   Acute ischemic stroke Active Problems:   OSA (obstructive sleep apnea)   Morbid obesity   CVA (cerebral infarction)   Type 2 diabetes mellitus with complication   Essential hypertension, benign   HLD (hyperlipidemia)   Acute renal failure superimposed on stage 3 chronic kidney disease   Acute ischemic left MCA stroke    Time spent: 25 min    Blake Bell  [facility] Pager [phone]. If 7PM-7AM, please contact night-coverage at [email or web address], password [facility] 12/05/2014, 10:44 AM  LOS: 3 days

## 2014-12-06 LAB — GLUCOSE, CAPILLARY
GLUCOSE-CAPILLARY: 123 mg/dL — AB (ref 65–99)
Glucose-Capillary: 146 mg/dL — ABNORMAL HIGH (ref 65–99)
Glucose-Capillary: 116 mg/dL — ABNORMAL HIGH (ref 65–99)

## 2014-12-06 LAB — BASIC METABOLIC PANEL
ANION GAP: 7 (ref 5–15)
BUN: 32 mg/dL — ABNORMAL HIGH (ref 6–20)
CHLORIDE: 107 mmol/L (ref 101–111)
CO2: 24 mmol/L (ref 22–32)
Calcium: 8.3 mg/dL — ABNORMAL LOW (ref 8.9–10.3)
Creatinine, Ser: 3.46 mg/dL — ABNORMAL HIGH (ref 0.61–1.24)
GFR calc non Af Amer: 17 mL/min — ABNORMAL LOW (ref >60)
GFR, EST AFRICAN AMERICAN: 20 mL/min — AB (ref >60)
Glucose, Bld: 126 mg/dL — ABNORMAL HIGH (ref 65–99)
Potassium: 3.9 mmol/L (ref 3.5–5.1)
Sodium: 138 mmol/L (ref 135–145)

## 2014-12-06 LAB — HEMOGLOBIN A1C
Hgb A1c MFr Bld: 7.1 % — ABNORMAL HIGH (ref 4.8–5.6)
MEAN PLASMA GLUCOSE: 157 mg/dL

## 2014-12-06 LAB — CBC
HEMATOCRIT: 33.6 % — AB (ref 39.0–52.0)
HEMOGLOBIN: 11.1 g/dL — AB (ref 13.0–17.0)
MCH: 28.7 pg (ref 26.0–34.0)
MCHC: 33 g/dL (ref 30.0–36.0)
MCV: 86.8 fL (ref 78.0–100.0)
Platelets: 341 10*3/uL (ref 150–400)
RBC: 3.87 MIL/uL — ABNORMAL LOW (ref 4.22–5.81)
RDW: 18.1 % — ABNORMAL HIGH (ref 11.5–15.5)
WBC: 7.3 10*3/uL (ref 4.0–10.5)

## 2014-12-06 LAB — MAGNESIUM: MAGNESIUM: 2 mg/dL (ref 1.7–2.4)

## 2014-12-06 MED ORDER — CLOPIDOGREL BISULFATE 75 MG PO TABS: 75.0000 mg | ORAL_TABLET | Freq: Every day | ORAL | Status: DC

## 2014-12-06 MED ORDER — AMLODIPINE BESYLATE 10 MG PO TABS: 10.0000 mg | ORAL_TABLET | Freq: Every day | ORAL | Status: DC

## 2014-12-06 NOTE — Discharge Summary (Signed)
Expand All Collapse All   Physician Discharge Summary  Blake Bell. JKK:[id number] DOB: 13-Oct-1950 DOA: 12/02/2014  PCP: Roselee Culver, MD  Admit date: 12/02/2014 Discharge date: 12/06/2014  Time spent: 35 minutes  Recommendations for Outpatient Follow-up:  1. BMp 1 week- nephrology 2. To SNF 3. Need further BP titration   Discharge Diagnoses:  Principal Problem:  Acute ischemic stroke Active Problems:  OSA (obstructive sleep apnea)  Morbid obesity  CVA (cerebral infarction)  Type 2 diabetes mellitus with complication  Essential hypertension, benign  HLD (hyperlipidemia)  Acute renal failure superimposed on stage 3 chronic kidney disease  Acute ischemic left MCA stroke   Discharge Condition: improved  Diet recommendation: cardiac/diabetic  Filed Weights   12/02/14 1812  Weight: 150.141 kg (331 lb)    History of present illness:  64 year old morbidly obese male with history of CVA (admitted in July 2015 for acute right-sided infarct),.hypertension, sleep apnea on nightly CPAP, pituitary prolactinoma, chronic kidney disease stage III (see [name] , baseline creatinine of 2.5), venous stasis ulcer, uncontrolled type 2 diabetes mellitus (not taking any medications for the past 4 months,? Due to cost) who was brought to the ED by his wife for acute onset of aphasia since last evening. Patient was at home watching TV and when wife called him from work he had significant word finding difficulty and was representing words. He was also increasingly confused. This morning he again had similar symptoms and increased confusion. He was also unable to cough properly. Patient is aware that he is in the hospital [facility]) but cannot extend why he is here. He is oriented to person, but gets confused with date. Patient denies any headache, blurred vision, weakness or numbness, chest pain, palpitations, shortness of breath, fevers, chills, nausea, vomiting,  abdominal pain, bowel or urinary symptoms. At baseline he ambulate with the help of a cane. Wife reports that patient takes his medications regularly as prescribed. He has not been taking his insulin or diabetic medication for almost last 4 months as they were expensive and thinks he does not need them anymore. Patient has not been able to establish a care with a primary doctor for past several months as well.  Hospital Course:  Acute ischemic stroke MRI brain showing acute nonhemorrhagic left sided infarct.  -MRA brain:Mild to moderate stenosis proximal right M1 segment. Otherwise no significant intracranial stenosis.  -2-D echo: Very limited study due to ppor sound wave transmission. LV appears normal. RV is dilated but function appears low normal. -carotid Doppler: Bilateral: 1-39% ICA stenosis. Vertebral artery flow is antegrade -A1c and lipid panel: LDL 169- on lipitor 80 at home -PT, OT evaluation- 24 hour supervision at home--- wife works but will provide 24 hour care- max out home health Plavix  -Appreciate neurology recommendations.   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   Essential hypertension, benign On multiple blood pressure medications. (Lasix, biotin, labetalol,azilsartan). Holding ARB due to acute kidney injury   HLD (hyperlipidemia) Continue statin.  hyperprolactinoma continue cabergoline twice weekly.  Procedures:  echo  Consultations: Neuro   Discharge Exam: Filed Vitals:   12/06/14 1005  BP: 172/80  Pulse: 66  Temp: 97.9 F (36.6 C)  Resp: 20    General:  A+Ox3, NAD Cardiovascular:rrr Respiratory: clear  Discharge  Instructions   Discharge Instructions    Diet - low sodium heart healthy  Complete by: As directed      Diet Carb Modified  Complete by: As directed      Discharge instructions  Complete by: As directed    BMP 1 week re CR Will need outpatient nephrology follow up (patient says he has seen [name] in the past)     Increase activity slowly  Complete by: As directed           Current Discharge Medication List    START taking these medications   Details  clopidogrel (PLAVIX) 75 MG tablet Take 1 tablet (75 mg total) by mouth daily with breakfast. Qty: 30 tablet, Refills: 0      CONTINUE these medications which have CHANGED   Details  amLODipine (NORVASC) 10 MG tablet Take 1 tablet (10 mg total) by mouth daily. Qty: 30 tablet, Refills: 0      CONTINUE these medications which have NOT CHANGED   Details  Albiglutide (TANZEUM) 30 MG PEN Inject 30 mg into the skin once a week.    atorvastatin (LIPITOR) 80 MG tablet Take 1 tablet (80 mg total) by mouth daily at 6 PM. Qty: 30 tablet, Refills: 0    Azilsartan Medoxomil (EDARBI) 40 MG TABS Take 1 tablet by mouth daily.    cabergoline (DOSTINEX) 0.5 MG tablet Take 0.5 tablets (0.25 mg total) by mouth 2 (two) times a week. Qty: 10 tablet, Refills: 0    escitalopram (LEXAPRO) 20 MG tablet Take 40 mg by mouth daily.    furosemide (LASIX) 80 MG tablet Take 80 mg by mouth daily.     insulin glargine (LANTUS) 100 UNIT/ML injection Inject 0.3 mLs (30 Units total) into the skin daily. Qty: 10 mL, Refills: 11    isosorbide-hydrALAZINE (BIDIL) 20-37.5 MG per tablet Take 2 tablets by mouth 3 (three) times daily.    labetalol (NORMODYNE) 200 MG tablet Take 200 mg by mouth 3 (three) times daily.      STOP taking these medications     aspirin 325 MG tablet      losartan (COZAAR) 50 MG tablet        No Known Allergies Follow-up  Information    Follow up with SETHI,PRAMOD, MD In 2 months.   Specialties: Neurology, Radiology   Why: office will call you for follow up appointment, stroke clinic   Contact information:   [place] [address] [phone]       Follow up with Roselee Culver, MD In 1 week.   Specialty: Family Medicine   Contact information:   [place] [address] [phone]         The results of significant diagnostics from this hospitalization (including imaging, microbiology, ancillary and laboratory) are listed below for reference.    Significant Diagnostic Studies:  Imaging Results    [name]  12/02/2014 CLINICAL DATA: Aphasia EXAM: MRI HEAD WITHOUT CONTRAST TECHNIQUE: Multiplanar, multiecho pulse sequences of the brain and surrounding structures were obtained without intravenous contrast. COMPARISON: 01/13/2014 brain MRI FINDINGS: Calvarium and upper cervical spine: No focal marrow signal abnormality. Orbits: No significant findings. Sinuses and Mastoids: Clear. Mastoid and middle ears are clear. Brain: Restricted diffusion in the left upper putamen in the corona radiata consistent with acute perforator infarct. This is anterior to the acute infarct seen in 2015. Additionally, there has been a  remote hemorrhagic infarct in the central left thalamus which has occurred since prior. Chronic vessel disease which is extensive for age, with confluent T2 and FLAIR hyperintensity around the lateral ventricles and an additional lacunar infarct in the right corona radiata. Chronic ischemic change is present throughout the pons. 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 No evidence of major vessel occlusion, acute hemorrhage, or obstructive hydrocephalus. IMPRESSION: 1. Acute, nonhemorrhagic perforator infarct affecting the left upper putamen and corona radiata. 2. Chronic small vessel  disease is extensive. A lacunar infarct in the left thalamus is chronic but has occurred since most recent imaging 01/13/2014. Electronically Signed By: Monte Fantasia M.D. On: 12/02/2014 16:15   [name]  12/03/2014 CLINICAL DATA: Stroke EXAM: MRA HEAD WITHOUT CONTRAST TECHNIQUE: Angiographic images of the Circle of Willis were obtained using MRA technique without intravenous contrast. COMPARISON: MRI head 12/02/2014 FINDINGS: Right vertebral dominant and widely patent to the basilar. Right PICA patent. Left vertebral artery ends in PICA and does not contribute to the basilar. The basilar is tortuous and indents the left side of the pons. No basilar stenosis. Superior cerebellar artery patent bilaterally. Fetal origin of the right posterior cerebral artery with hypoplastic right P1 segment. Posterior cerebral arteries patent bilaterally without significant stenosis Cavernous carotid widely patent bilaterally without stenosis. Anterior cerebral arteries patent bilaterally. Mild to moderate stenosis proximal right M1 segment. Right middle cerebral artery branches widely patent. Left middle cerebral artery widely patent. Negative for cerebral aneurysm. IMPRESSION: Mild to moderate stenosis proximal right M1 segment. Otherwise no significant intracranial stenosis. Electronically Signed By: Franchot Gallo M.D. On: 12/03/2014 08:46     Microbiology: Recent Results (from the past 240 hour(s))  MRSA PCR Screening Status: Abnormal   Collection Time: 12/02/14 9:41 PM  Result Value Ref Range Status   MRSA by PCR POSITIVE (A) NEGATIVE Final    Comment:   The GeneXpert MRSA Assay (FDA approved for NASAL specimens only), is one component of a comprehensive MRSA colonization surveillance program. It is not intended to diagnose MRSA infection nor to guide or monitor treatment for MRSA infections. RESULT CALLED TO, READ BACK BY AND VERIFIED  WITH: A [facility] [id number] [facility]      Labs: Basic Metabolic Panel:  Last Labs      Recent Labs Lab 12/02/14 1036 12/03/14 0545 12/05/14 0609 12/06/14 0600  NA 139 140 138 138  K 3.9 3.5 3.4* 3.9  CL 107 105 104 107  CO2 24 26 25 24   GLUCOSE 177* 123* 131* 126*  BUN 27* 22* 26* 32*  CREATININE 3.16* 2.86* 3.39* 3.46*  CALCIUM 8.7* 9.0 8.5* 8.3*  MG --  --  --  2.0     Liver Function Tests:  Last Labs     No results for input(s): AST, ALT, ALKPHOS, BILITOT, PROT, ALBUMIN in the last 168 hours.    Last Labs     No results for input(s): LIPASE, AMYLASE in the last 168 hours.    Last Labs     No results for input(s): AMMONIA in the last 168 hours.   CBC:  Last Labs      Recent Labs Lab 12/02/14 1036 12/05/14 0609 12/06/14 0600  WBC 9.9 7.5 7.3  NEUTROABS 8.3* --  --   HGB 11.7* 11.3* 11.1*  HCT 36.2* 35.0* 33.6*  MCV 87.4 86.2 86.8  PLT 354 343 341     Cardiac Enzymes:  Last Labs      Recent Labs Lab 12/02/14  [place] <0.03     BNP: BNP (last 3 results)  Recent Labs (within last 365 days)    No results for input(s): BNP in the last 8760 hours.    ProBNP (last 3 results)  Recent Labs (within last 365 days)    No results for input(s): PROBNP in the last 8760 hours.    CBG:  Last Labs      Recent Labs Lab 12/05/14 1130 12/05/14 1611 12/05/14 2132 12/06/14 0624 12/06/14 1157  GLUCAP 148* 144* 119* 123* 146*         Signed:  Javarius Tsosie [facility] 12/06/2014, 4:59 PM

## 2014-12-06 NOTE — Clinical Social Work Note (Addendum)
CSW attempted to call Leda Gauze to follow up with her about SNF placement. Leda Gauze was unavailable. CSW left a message for [facility].   Addendum: CSW received a call from [facility]. Leda Gauze declined SNF. Leda Gauze express interest in taking the pt home. Leda Gauze reported that she will pick the pt up at [facility] informed MD and case manager. CSW will sign off.   [place], MSW, [place]

## 2014-12-06 NOTE — Progress Notes (Signed)
CSW was notified by physician that the pt was offered a bed at [facility] today. However, the family declined. This afternoon, the pt's family has changed their mind and would now like to accept the offer.  CSW reached out to [facility], who states that the pt is still welcomed to come to the facility. Tammy has requested discharge summary paper work be faxed. CSW will fax the requested document.  Willette Brace [phone] ED CSW 12/06/2014 5:41 PM

## 2014-12-06 NOTE — Progress Notes (Addendum)
Called by nurse- family has now presented and has changed their minds, they would like SNF.  Called social worker in ER who will contact SNF- appears to be [name]  Patient is medically ready for d/c and d/c was done for home as per family request this AM [name]

## 2014-12-06 NOTE — Care Management Note (Addendum)
Case Management Note  Patient Details  Name: Blake Slimp Sr. MRN: [id number] Date of Birth: 06/04/1951  Subjective/Objective:                    Action/Plan:   Addendum: Received a call from Miranda with [facility] stating that patient is declining HH services due to high copays.  Met with patient to discuss discharge needs. Patient is agreeable to home health and has chosen [facility], which he has used in the past.  Miranda with [facility] was notified and has accepted the referral for discharge home today.  Expected Discharge Date:                  Expected Discharge Plan:  Home w Home Health Services  In-House Referral:     Discharge planning Services  CM Consult  Post Acute Care Choice:    Choice offered to:  Patient  DME Arranged:    DME Agency:     HH Arranged:  RN, PT, OT, Nurse's Aide (CSW) HH Agency:  [facility]  Status of Service:  Completed, signed off  Medicare Important Message Given:  No Date Medicare IM Given:    Medicare IM give by:    Date Additional Medicare IM Given:    Additional Medicare Important Message give by:     If discussed at Long Length of Stay Meetings, dates discussed:    Additional Comments:  ,  C, RN 12/06/2014, 11:08 AM  

## 2014-12-06 NOTE — Progress Notes (Signed)
Physician Discharge Summary  Blake Ina Sr. [id number]:[id number] DOB: 04-14-1951 DOA: 12/02/2014  PCP: Blake Culver, MD  Admit date: 12/02/2014 Discharge date: 12/06/2014  Time spent: 35 minutes  Recommendations for Outpatient Follow-up:  1. BMp 1 week- nephrology 2. To SNF 3. Need further BP titration   Discharge Diagnoses:  Principal Problem:   Acute ischemic stroke Active Problems:   OSA (obstructive sleep apnea)   Morbid obesity   CVA (cerebral infarction)   Type 2 diabetes mellitus with complication   Essential hypertension, benign   HLD (hyperlipidemia)   Acute renal failure superimposed on stage 3 chronic kidney disease   Acute ischemic left MCA stroke   Discharge Condition: improved  Diet recommendation: cardiac/diabetic  Filed Weights   12/02/14 1812  Weight: 150.141 kg (331 lb)    History of present illness:  64 year old morbidly obese male with history of CVA (admitted in July 2015 for acute right-sided infarct),.hypertension, sleep apnea on nightly CPAP, pituitary prolactinoma, chronic kidney disease stage III (see [name] , baseline creatinine of 2.5), venous stasis ulcer, uncontrolled type 2 diabetes mellitus (not taking any medications for the past 4 months,? Due to cost) who was brought to the ED by his wife for acute onset of aphasia since last evening. Patient was at home watching TV and when wife called him from work he had significant word finding difficulty and was representing words. He was also increasingly confused. This morning he again had similar symptoms and increased confusion. He was also unable to cough properly. Patient is aware that he is in the hospital [facility]) but cannot extend why he is here. He is oriented to person, but gets confused with date. Patient denies any headache, blurred vision, weakness or numbness, chest pain, palpitations, shortness of breath, fevers, chills, nausea, vomiting, abdominal pain, bowel or urinary  symptoms. At baseline he ambulate with the help of a cane. Wife reports that patient takes his medications regularly as prescribed. He has not been taking his insulin or diabetic medication for almost last 4 months as they were expensive and thinks he does not need them anymore. Patient has not been able to establish a care with a primary doctor for past several months as well.  Hospital Course:  Acute ischemic stroke MRI brain showing acute nonhemorrhagic left sided infarct.  -MRA brain:Mild to moderate stenosis proximal right M1 segment. Otherwise no significant intracranial stenosis.  -2-D echo: Very limited study due to ppor sound wave transmission. LV appears normal. RV is dilated but function appears low normal. -carotid Doppler: Bilateral: 1-39% ICA stenosis. Vertebral artery flow is antegrade -A1c and lipid panel: LDL 169- on lipitor 80 at home -PT, OT evaluation- 24 hour supervision at home--- wife works but will provide 24 hour care- max out home health Plavix  -Appreciate neurology recommendations.   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   Essential hypertension, benign On multiple blood pressure medications. (Lasix, biotin, labetalol,azilsartan). Holding ARB due to acute kidney injury   HLD (hyperlipidemia) Continue statin.  hyperprolactinoma continue cabergoline twice weekly.  Procedures:  echo  Consultations: Neuro   Discharge Exam: Filed Vitals:   12/06/14 1005  BP: 172/80  Pulse: 66  Temp: 97.9 F (36.6 C)  Resp: 20  General: A+Ox3, NAD Cardiovascular:rrr Respiratory: clear  Discharge Instructions   Discharge Instructions    Diet - low  sodium heart healthy    Complete by:  As directed      Diet Carb Modified    Complete by:  As directed      Discharge instructions    Complete by:  As directed    BMP 1 week re CR Will need outpatient nephrology follow up (patient says he has seen [name] in the past)     Increase activity slowly    Complete by:  As directed           Current Discharge Medication List    START taking these medications   Details  clopidogrel (PLAVIX) 75 MG tablet Take 1 tablet (75 mg total) by mouth daily with breakfast. Qty: 30 tablet, Refills: 0      CONTINUE these medications which have CHANGED   Details  amLODipine (NORVASC) 10 MG tablet Take 1 tablet (10 mg total) by mouth daily. Qty: 30 tablet, Refills: 0      CONTINUE these medications which have NOT CHANGED   Details  Albiglutide (TANZEUM) 30 MG PEN Inject 30 mg into the skin once a week.    atorvastatin (LIPITOR) 80 MG tablet Take 1 tablet (80 mg total) by mouth daily at 6 PM. Qty: 30 tablet, Refills: 0    Azilsartan Medoxomil (EDARBI) 40 MG TABS Take 1 tablet by mouth daily.    cabergoline (DOSTINEX) 0.5 MG tablet Take 0.5 tablets (0.25 mg total) by mouth 2 (two) times a week. Qty: 10 tablet, Refills: 0    escitalopram (LEXAPRO) 20 MG tablet Take 40 mg by mouth daily.    furosemide (LASIX) 80 MG tablet Take 80 mg by mouth daily.     insulin glargine (LANTUS) 100 UNIT/ML injection Inject 0.3 mLs (30 Units total) into the skin daily. Qty: 10 mL, Refills: 11    isosorbide-hydrALAZINE (BIDIL) 20-37.5 MG per tablet Take 2 tablets by mouth 3 (three) times daily.    labetalol (NORMODYNE) 200 MG tablet Take 200 mg by mouth 3 (three) times daily.      STOP taking these medications     aspirin 325 MG tablet      losartan (COZAAR) 50 MG tablet        No Known Allergies Follow-up Information    Follow up with SETHI,PRAMOD, MD In 2 months.   Specialties:  Neurology, Radiology   Why:  office will call you for follow up  appointment, stroke clinic   Contact information:   [place] [address] [phone]       Follow up with Blake Culver, MD In 1 week.   Specialty:  Family Medicine   Contact information:   [place] [address] [phone]        The results of significant diagnostics from this hospitalization (including imaging, microbiology, ancillary and laboratory) are listed below for reference.    Significant Diagnostic Studies: [name]  12/02/2014   CLINICAL DATA:  Aphasia  EXAM: MRI HEAD WITHOUT CONTRAST  TECHNIQUE: Multiplanar, multiecho pulse sequences of the brain and surrounding structures were obtained without intravenous contrast.  COMPARISON:  01/13/2014 brain MRI  FINDINGS: Calvarium and upper cervical spine: No focal marrow signal abnormality.  Orbits: No significant findings.  Sinuses and Mastoids: Clear. Mastoid and middle ears are clear.  Brain: Restricted diffusion in the left upper putamen in the corona radiata  consistent with acute perforator infarct. This is anterior to the acute infarct seen in 2015. Additionally, there has been a remote hemorrhagic infarct in the central left thalamus which has occurred since prior. Chronic vessel disease which is extensive for age, with confluent T2 and FLAIR hyperintensity around the lateral ventricles and an additional lacunar infarct in the right corona radiata. Chronic ischemic change is present throughout the pons. 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 No evidence of major vessel occlusion, acute hemorrhage, or obstructive hydrocephalus.  IMPRESSION: 1. Acute, nonhemorrhagic perforator infarct affecting the left upper putamen and corona radiata. 2. 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 A lacunar infarct in the left thalamus is chronic but has occurred since most recent imaging 01/13/2014.   Electronically Signed   By: Monte Fantasia M.D.   On:  12/02/2014 16:15   [name]  12/03/2014   CLINICAL DATA:  Stroke  EXAM: MRA HEAD WITHOUT CONTRAST  TECHNIQUE: Angiographic images of the Circle of Willis were obtained using MRA technique without intravenous contrast.  COMPARISON:  MRI head 12/02/2014  FINDINGS: Right vertebral dominant and widely patent to the basilar. Right PICA patent. Left vertebral artery ends in PICA and does not contribute to the basilar. The basilar is tortuous and indents the left side of the pons. No basilar stenosis. Superior cerebellar artery patent bilaterally. Fetal origin of the right posterior cerebral artery with hypoplastic right P1 segment. Posterior cerebral arteries patent bilaterally without significant stenosis  Cavernous carotid widely patent bilaterally without stenosis. Anterior cerebral arteries patent bilaterally. Mild to moderate stenosis proximal right M1 segment. Right middle cerebral artery branches widely patent. Left middle cerebral artery widely patent.  Negative for cerebral aneurysm.  IMPRESSION: Mild to moderate stenosis proximal right M1 segment. Otherwise no significant intracranial stenosis.   Electronically Signed   By: Franchot Gallo M.D.   On: 12/03/2014 08:46    Microbiology: Recent Results (from the past 240 hour(s))  MRSA PCR Screening     Status: Abnormal   Collection Time: 12/02/14  9:41 PM  Result Value Ref Range Status   MRSA by PCR POSITIVE (A) NEGATIVE Final    Comment:        The GeneXpert MRSA Assay (FDA approved for NASAL specimens only), is one component of a comprehensive MRSA colonization surveillance program. It is not intended to diagnose MRSA infection nor to guide or monitor treatment for MRSA infections. RESULT CALLED TO, READ BACK BY AND VERIFIED WITH: A THOMPSON,RN [id number] [facility]      Labs: Basic Metabolic Panel:  Recent Labs Lab 12/02/14 1036 12/03/14 0545 12/05/14 0609 12/06/14 0600  NA 139 140 138 138  K 3.9 3.5 3.4* 3.9  CL  107 105 104 107  CO2 24 26 25 24   GLUCOSE 177* 123* 131* 126*  BUN 27* 22* 26* 32*  CREATININE 3.16* 2.86* 3.39* 3.46*  CALCIUM 8.7* 9.0 8.5* 8.3*  MG  --   --   --  2.0   Liver Function Tests: No results for input(s): AST, ALT, ALKPHOS, BILITOT, PROT, ALBUMIN in the last 168 hours. No results for input(s): LIPASE, AMYLASE in the last 168 hours. No results for input(s): AMMONIA in the last 168 hours. CBC:  Recent Labs Lab 12/02/14 1036 12/05/14 0609 12/06/14 0600  WBC 9.9 7.5 7.3  NEUTROABS 8.3*  --   --   HGB 11.7* 11.3* 11.1*  HCT 36.2* 35.0* 33.6*  MCV 87.4 86.2 86.8  PLT 354 343 341  Cardiac Enzymes:  Recent Labs Lab 12/02/14 1036  TROPONINI <0.03   BNP: BNP (last 3 results) No results for input(s): BNP in the last 8760 hours.  ProBNP (last 3 results) No results for input(s): PROBNP in the last 8760 hours.  CBG:  Recent Labs Lab 12/05/14 1130 12/05/14 1611 12/05/14 2132 12/06/14 0624 12/06/14 1157  GLUCAP 148* 144* 119* 123* 146*       Signed:  Sherman Donaldson  [facility] 12/06/2014, 4:59 PM

## 2014-12-06 NOTE — Progress Notes (Signed)
Physician Discharge Summary  Blake Ina Sr. UVO:[id number] DOB: 1951/02/25 DOA: 12/02/2014  PCP: Blake Culver, MD  Admit date: 12/02/2014 Discharge date: 12/06/2014  Time spent: 35 minutes  Recommendations for Outpatient Follow-up:  1. BMp 1 week- nephrology 2. Max out home health- high risk for rehospitalization-- family to provide 24 hour care 3. Need further BP titration  Discharge Diagnoses:  Principal Problem:   Acute ischemic stroke Active Problems:   OSA (obstructive sleep apnea)   Morbid obesity   CVA (cerebral infarction)   Type 2 diabetes mellitus with complication   Essential hypertension, benign   HLD (hyperlipidemia)   Acute renal failure superimposed on stage 3 chronic kidney disease   Acute ischemic left MCA stroke   Discharge Condition: improved  Diet recommendation: cardiac/diabetic  Filed Weights   12/02/14 1812  Weight: 150.141 kg (331 lb)    History of present illness:  64 year old morbidly obese male with history of CVA (admitted in July 2015 for acute right-sided infarct),.hypertension, sleep apnea on nightly CPAP, pituitary prolactinoma, chronic kidney disease stage III (see Blake Bell , baseline creatinine of 2.5), venous stasis ulcer, uncontrolled type 2 diabetes mellitus (not taking any medications for the past 4 months,? Due to cost) who was brought to the ED by his wife for acute onset of aphasia since last evening. Patient was at home watching TV and when wife called him from work he had significant word finding difficulty and was representing words. He was also increasingly confused. This morning he again had similar symptoms and increased confusion. He was also unable to cough properly. Patient is aware that he is in the Bell Blake Bell) but cannot extend why he is here. He is oriented to person, but gets confused with date. Patient denies any headache, blurred vision, weakness or numbness, chest pain, palpitations, shortness of  breath, fevers, chills, nausea, vomiting, abdominal pain, bowel or urinary symptoms. At baseline he ambulate with the help of a cane. Wife reports that patient takes his medications regularly as prescribed. He has not been taking his insulin or diabetic medication for almost last 4 months as they were expensive and thinks he does not need them anymore. Patient has not been able to establish a care with a primary doctor for past several months as well.  Bell Course:  Acute ischemic stroke MRI brain showing acute nonhemorrhagic left sided infarct.  -MRA brain:Mild to moderate stenosis proximal right M1 segment. Otherwise no significant intracranial stenosis.  -2-D echo: Very limited study due to ppor sound wave transmission. LV appears normal. RV is dilated but function appears low normal. -carotid Doppler: Bilateral: 1-39% ICA stenosis. Vertebral artery flow is antegrade -A1c and lipid panel: LDL 169- on lipitor 80 at home -PT, OT evaluation- 24 hour supervision at home--- wife works but will provide 24 hour care- max out home health Plavix  -Appreciate neurology recommendations.   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   Essential hypertension, benign On multiple blood pressure medications. (Lasix, biotin, labetalol,azilsartan). Holding ARB due to acute kidney injury   HLD (hyperlipidemia) Continue statin.  hyperprolactinoma continue cabergoline twice weekly.  Procedures:  echo  Consultations: Neuro   Discharge Exam: Filed Vitals:   12/06/14 1005  BP: 172/80  Pulse:  66  Temp: 97.9 F (36.6 C)  Resp: 20    General: A+Ox3, NAD Cardiovascular:rrr Respiratory:  clear  Discharge Instructions   Discharge Instructions    Diet - low sodium heart healthy    Complete by:  As directed      Diet Carb Modified    Complete by:  As directed      Discharge instructions    Complete by:  As directed   Home health BMP 1 week re CR Will need outpatient nephrology follow up (patient says he has seen Blake Bell in the past)     Increase activity slowly    Complete by:  As directed           Current Discharge Medication List    START taking these medications   Details  clopidogrel (PLAVIX) 75 MG tablet Take 1 tablet (75 mg total) by mouth daily with breakfast. Qty: 30 tablet, Refills: 0      CONTINUE these medications which have CHANGED   Details  amLODipine (NORVASC) 10 MG tablet Take 1 tablet (10 mg total) by mouth daily. Qty: 30 tablet, Refills: 0      CONTINUE these medications which have NOT CHANGED   Details  Albiglutide (TANZEUM) 30 MG PEN Inject 30 mg into the skin once a week.    atorvastatin (LIPITOR) 80 MG tablet Take 1 tablet (80 mg total) by mouth daily at 6 PM. Qty: 30 tablet, Refills: 0    Azilsartan Medoxomil (EDARBI) 40 MG TABS Take 1 tablet by mouth daily.    cabergoline (DOSTINEX) 0.5 MG tablet Take 0.5 tablets (0.25 mg total) by mouth 2 (two) times a week. Qty: 10 tablet, Refills: 0    escitalopram (LEXAPRO) 20 MG tablet Take 40 mg by mouth daily.    furosemide (LASIX) 80 MG tablet Take 80 mg by mouth daily.     insulin glargine (LANTUS) 100 UNIT/ML injection Inject 0.3 mLs (30 Units total) into the skin daily. Qty: 10 mL, Refills: 11    isosorbide-hydrALAZINE (BIDIL) 20-37.5 MG per tablet Take 2 tablets by mouth 3 (three) times daily.    labetalol (NORMODYNE) 200 MG tablet Take 200 mg by mouth 3 (three) times daily.      STOP taking these medications     aspirin 325 MG tablet      losartan (COZAAR) 50 MG tablet        No Known Allergies Follow-up Information    Follow up with Bell,PRAMOD, MD In 2  months.   Specialties:  Neurology, Radiology   Why:  office will call you for follow up appointment, stroke clinic   Contact information:   [place] [address] [phone]       Follow up with Blake Culver, MD In 1 week.   Specialty:  Family Medicine   Contact information:   [place] [address] [phone]        The results of significant diagnostics from this hospitalization (including imaging, microbiology, ancillary and laboratory) are listed below for reference.    Significant Diagnostic Studies: [name]  12/02/2014   CLINICAL DATA:  Aphasia  EXAM: MRI HEAD WITHOUT CONTRAST  TECHNIQUE: Multiplanar, multiecho pulse sequences of the brain and surrounding structures were obtained without intravenous contrast.  COMPARISON:  01/13/2014 brain MRI  FINDINGS: Calvarium and upper cervical spine: No focal marrow signal abnormality.  Orbits: No significant findings.  Sinuses and Mastoids: Clear. Mastoid and middle ears are  clear.  Brain: Restricted diffusion in the left upper putamen in the corona radiata consistent with acute perforator infarct. This is anterior to the acute infarct seen in 2015. Additionally, there has been a remote hemorrhagic infarct in the central left thalamus which has occurred since prior. Chronic vessel disease which is extensive for age, with confluent T2 and FLAIR hyperintensity around the lateral ventricles and an additional lacunar infarct in the right corona radiata. Chronic ischemic change is present throughout the pons. 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 No evidence of major vessel occlusion, acute hemorrhage, or obstructive hydrocephalus.  IMPRESSION: 1. Acute, nonhemorrhagic perforator infarct affecting the left upper putamen and corona radiata. 2. 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 A lacunar infarct in the left thalamus is chronic but has occurred since most  recent imaging 01/13/2014.   Electronically Signed   By: Monte Fantasia M.D.   On: 12/02/2014 16:15   [name]  12/03/2014   CLINICAL DATA:  Stroke  EXAM: MRA HEAD WITHOUT CONTRAST  TECHNIQUE: Angiographic images of the Circle of Willis were obtained using MRA technique without intravenous contrast.  COMPARISON:  MRI head 12/02/2014  FINDINGS: Right vertebral dominant and widely patent to the basilar. Right PICA patent. Left vertebral artery ends in PICA and does not contribute to the basilar. The basilar is tortuous and indents the left side of the pons. No basilar stenosis. Superior cerebellar artery patent bilaterally. Fetal origin of the right posterior cerebral artery with hypoplastic right P1 segment. Posterior cerebral arteries patent bilaterally without significant stenosis  Cavernous carotid widely patent bilaterally without stenosis. Anterior cerebral arteries patent bilaterally. Mild to moderate stenosis proximal right M1 segment. Right middle cerebral artery branches widely patent. Left middle cerebral artery widely patent.  Negative for cerebral aneurysm.  IMPRESSION: Mild to moderate stenosis proximal right M1 segment. Otherwise no significant intracranial stenosis.   Electronically Signed   By: Franchot Gallo M.D.   On: 12/03/2014 08:46    Microbiology: Recent Results (from the past 240 hour(s))  MRSA PCR Screening     Status: Abnormal   Collection Time: 12/02/14  9:41 PM  Result Value Ref Range Status   MRSA by PCR POSITIVE (A) NEGATIVE Final    Comment:        The GeneXpert MRSA Assay (FDA approved for NASAL specimens only), is one component of a comprehensive MRSA colonization surveillance program. It is not intended to diagnose MRSA infection nor to guide or monitor treatment for MRSA infections. RESULT CALLED TO, READ BACK BY AND VERIFIED WITH: A THOMPSON,RN [id number] [place] [facility]      Labs: Basic Metabolic Panel:  Recent Labs Lab 12/02/14 1036  12/03/14 0545 12/05/14 0609 12/06/14 0600  NA 139 140 138 138  K 3.9 3.5 3.4* 3.9  CL 107 105 104 107  CO2 24 26 25 24   GLUCOSE 177* 123* 131* 126*  BUN 27* 22* 26* 32*  CREATININE 3.16* 2.86* 3.39* 3.46*  CALCIUM 8.7* 9.0 8.5* 8.3*  MG  --   --   --  2.0   Liver Function Tests: No results for input(s): AST, ALT, ALKPHOS, BILITOT, PROT, ALBUMIN in the last 168 hours. No results for input(s): LIPASE, AMYLASE in the last 168 hours. No results for input(s): AMMONIA in the last 168 hours. CBC:  Recent Labs Lab 12/02/14 1036 12/05/14 0609 12/06/14 0600  WBC 9.9 7.5 7.3  NEUTROABS 8.3*  --   --   HGB 11.7* 11.3* 11.1*  HCT  36.2* 35.0* 33.6*  MCV 87.4 86.2 86.8  PLT 354 343 341   Cardiac Enzymes:  Recent Labs Lab 12/02/14 1036  TROPONINI <0.03   BNP: BNP (last 3 results) No results for input(s): BNP in the last 8760 hours.  ProBNP (last 3 results) No results for input(s): PROBNP in the last 8760 hours.  CBG:  Recent Labs Lab 12/05/14 1130 12/05/14 1611 12/05/14 2132 12/06/14 0624 12/06/14 1157  GLUCAP 148* 144* 119* 123* 146*       Signed:  Berlie Persky  [facility] 12/06/2014, 12:46 PM

## 2014-12-07 LAB — HIV ANTIBODY (ROUTINE TESTING W REFLEX): HIV Screen 4th Generation wRfx: NONREACTIVE

## 2014-12-07 LAB — RPR: RPR Ser Ql: NONREACTIVE

## 2014-12-12 ENCOUNTER — Encounter: Payer: Self-pay | Admitting: Adult Health

## 2014-12-12 ENCOUNTER — Non-Acute Institutional Stay (SKILLED_NURSING_FACILITY): Payer: 59 | Admitting: Adult Health

## 2014-12-12 DIAGNOSIS — I63512 Cerebral infarction due to unspecified occlusion or stenosis of left middle cerebral artery: Secondary | ICD-10-CM | POA: Diagnosis not present

## 2014-12-12 DIAGNOSIS — E119 Type 2 diabetes mellitus without complications: Secondary | ICD-10-CM | POA: Insufficient documentation

## 2014-12-12 DIAGNOSIS — N183 Chronic kidney disease, stage 3 unspecified: Secondary | ICD-10-CM

## 2014-12-12 DIAGNOSIS — G4733 Obstructive sleep apnea (adult) (pediatric): Secondary | ICD-10-CM

## 2014-12-12 DIAGNOSIS — Z794 Long term (current) use of insulin: Secondary | ICD-10-CM

## 2014-12-12 DIAGNOSIS — I10 Essential (primary) hypertension: Secondary | ICD-10-CM

## 2014-12-12 DIAGNOSIS — E1149 Type 2 diabetes mellitus with other diabetic neurological complication: Secondary | ICD-10-CM

## 2014-12-12 DIAGNOSIS — E785 Hyperlipidemia, unspecified: Secondary | ICD-10-CM | POA: Diagnosis not present

## 2014-12-12 DIAGNOSIS — E1140 Type 2 diabetes mellitus with diabetic neuropathy, unspecified: Secondary | ICD-10-CM

## 2014-12-12 NOTE — Progress Notes (Signed)
Patient ID: Blake Bell., male   DOB: [birth year], 64 y.o.   MRN: [id number]  [name] living [place]     No Known Allergies     Chief Complaint  Patient presents with  . Hospitalization Follow-up    HPI:  He has been hospitalized for an acute ischemic cva. He has a history of diabetes; stage III ckd and osa. He is here for short term rehab with his goal to return back home. He is ambulating with a cane. His speech is hesitant. He is not voicing any concerns or complaints at this time. There are no nursing concerns at this time.    Past Medical History  Diagnosis Date  . Hypertension   . Multiple wounds     on lower legs - sees [name] Pain at the [facility]  . Hypercholesterolemia   . OSA on CPAP     uses cpap, setting of 5  . Prolactin secreting pituitary adenoma     [name] 12/02/2014  . Type II diabetes mellitus     uncontrolled/notes 12/02/2014  . Chronic kidney disease (CKD), stage III (moderate)     sees dr Florene Glen nephrology every 6-9 months  . CVA (cerebral vascular accident) 12/2013    admitted in July 2015 for acute right-sided infarct/notes 12/02/2014  . 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 Colonoscopy  6-7 yrs ago  . Colonoscopy N/A 04/05/2013    Procedure: COLONOSCOPY;  Surgeon: Juanita Craver, MD;  Location: [facility];  Service: Endoscopy;  Laterality: N/A;    VITAL SIGNS BP 130/74 mmHg  Pulse 69  Ht 5\' 9"  (1.753 m)  Wt 335 lb (151.955 kg)  BMI 49.45 kg/m2  SpO2 98%   Outpatient Encounter Prescriptions as of 12/12/2014  Medication Sig  . Albiglutide (TANZEUM) 30 MG PEN Inject 30 mg into the skin once a week.  Marland Kitchen amLODipine (NORVASC) 10 MG tablet Take 1 tablet (10 mg total) by mouth daily.  Marland Kitchen atorvastatin (LIPITOR) 80 MG tablet Take 1 tablet (80 mg total) by mouth daily at 6 PM.  . Azilsartan Medoxomil (EDARBI) 40 MG TABS Take 1 tablet by mouth daily.  . cabergoline (DOSTINEX) 0.5 MG  tablet Take 0.5 tablets (0.25 mg total) by mouth 2 (two) times a week.  . clopidogrel (PLAVIX) 75 MG tablet Take 1 tablet (75 mg total) by mouth daily with breakfast.  . escitalopram (LEXAPRO) 20 MG tablet Take 20 mg by mouth daily.   . furosemide (LASIX) 80 MG tablet Take 80 mg by mouth daily.   . insulin glargine (LANTUS) 100 UNIT/ML injection Inject 0.3 mLs (30 Units total) into the skin daily.  . isosorbide-hydrALAZINE (BIDIL) 20-37.5 MG per tablet Take 2 tablets by mouth 3 (three) times daily.  Marland Kitchen labetalol (NORMODYNE) 200 MG tablet Take 200 mg by mouth 3 (three) times daily.      SIGNIFICANT DIAGNOSTIC EXAMS  12-02-14: mri of brain: 1. Acute, nonhemorrhagic perforator infarct affecting the left upper putamen and corona radiata. 2. 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 A lacunar infarct in the left thalamus is chronic but has occurred since most recent imaging 01/13/2014.  12-03-14: mra of head: Mild to moderate stenosis proximal right M1 segment. Otherwise no significant intracranial stenosis.   12-04-14: 2-d echo:  - Left ventricle: The cavity size was normal. There was moderate concentric hypertrophy. Systolic function was normal. The estimated ejection fraction was in the range of 60% to 65%. -  Left atrium: The atrium was mildly dilated. - Right ventricle: The cavity size was moderately dilated. Wall thickness was normal. - Impressions: Very limited study due to ppor sound wave transmission. LV appears normal. RV is dilated but function appears low normal.    LABS REVIEWED:   12-02-14: wbc 9.9; hgb 11.7; hct 36.2; mcv 87.4; plt 354; glucose 177; bun 27; creat 3.16; k+3.9; na++139 12-03-14: chol 243; ldl 169; trig 113; hdl 51; hgb a1c 7.1; tsh 3.182; vit b12: 421; RPR: nr; HIV: nr 12-06-14: wbc 7.3; hgb 11.1; hct 33.6; mcv 86.8; plt 341; glucose 126; bun 32; creat 3.46; k+3.9; na++138; mag 2.0     Review of Systems  Constitutional: Negative for malaise/fatigue.  Respiratory:  Negative for cough and shortness of breath.   Cardiovascular: Negative for chest pain, palpitations and leg swelling.  Gastrointestinal: Negative for heartburn, abdominal pain and constipation.  Musculoskeletal: Negative for myalgias and joint pain.  Skin: Negative.   Neurological: Negative for headaches.     Physical Exam  Constitutional: He is oriented to person, place, and time. No distress.  Obese   Neck: Neck supple. No JVD present. No thyromegaly present.  Cardiovascular: Normal rate, regular rhythm and intact distal pulses.   Respiratory: Effort normal and breath sounds normal. No respiratory distress.  GI: Soft. Bowel sounds are normal. He exhibits no distension.  Musculoskeletal: He exhibits no edema.  Is able to move all extremities Ambulates with cane Very slight left upper extremity weakness   Lymphadenopathy:    He has no cervical adenopathy.  Neurological: He is alert and oriented to person, place, and time.  Has hesitant speech pattern.   Skin: Skin is warm and dry. He is not diaphoretic.  Psychiatric: He has a normal mood and affect.     ASSESSMENT/ PLAN:  1. Acute ischemic cva: he is neurologically stable at this time; will continue therapy as directed to improve upon his level of independence with his adl's and to improve upon his speech; will continue plavix 75 mg daily and will monitor  2. Hypertension: will continue his norvasc 10 mg daily; bidil 30-37.5 mg  2 tabs three times daily; edarbi 40 mg daily labetolol 200 mg three times daily and will monitor his status.   3. CKD stage III: his renal function is stable at this time; his creat is 3.46 and will recheck bmp  4. Hyperprolactinemia: will continue dostinex 0.5 mg  1/2 tab twice weekly will monitor   5. 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 Edema: will continue lasix 80 mg daily and will monitor   7. Depression: is stable; will continue lexapro 20 mg  daily   8. Dyslipidemia: will continue lipitor 80 mg daily his ldl is 169  9. Diabetes: will continue tanzeum 30 mg weekly; lantus 30 units daily and will monitor   10. OSA: uses cpap nightly will monitor   Will check bmp   Time spent with patient 50 minutes.   Ok Edwards NP [facility]  Contact [phone] Monday through Friday 8am- 5pm  After hours call [phone]

## 2014-12-13 ENCOUNTER — Non-Acute Institutional Stay (SKILLED_NURSING_FACILITY): Payer: 59 | Admitting: Internal Medicine

## 2014-12-13 ENCOUNTER — Encounter: Payer: Self-pay | Admitting: Internal Medicine

## 2014-12-13 DIAGNOSIS — N183 Chronic kidney disease, stage 3 unspecified: Secondary | ICD-10-CM

## 2014-12-13 DIAGNOSIS — E785 Hyperlipidemia, unspecified: Secondary | ICD-10-CM | POA: Diagnosis not present

## 2014-12-13 DIAGNOSIS — I10 Essential (primary) hypertension: Secondary | ICD-10-CM | POA: Diagnosis not present

## 2014-12-13 DIAGNOSIS — I69851 Hemiplegia and hemiparesis following other cerebrovascular disease affecting right dominant side: Secondary | ICD-10-CM

## 2014-12-13 DIAGNOSIS — F4489 Other dissociative and conversion disorders: Secondary | ICD-10-CM | POA: Diagnosis not present

## 2014-12-13 DIAGNOSIS — I63512 Cerebral infarction due to unspecified occlusion or stenosis of left middle cerebral artery: Secondary | ICD-10-CM | POA: Diagnosis not present

## 2014-12-13 DIAGNOSIS — G4733 Obstructive sleep apnea (adult) (pediatric): Secondary | ICD-10-CM

## 2014-12-13 DIAGNOSIS — I69351 Hemiplegia and hemiparesis following cerebral infarction affecting right dominant side: Secondary | ICD-10-CM

## 2014-12-13 DIAGNOSIS — E1149 Type 2 diabetes mellitus with other diabetic neurological complication: Secondary | ICD-10-CM

## 2014-12-13 DIAGNOSIS — E1140 Type 2 diabetes mellitus with diabetic neuropathy, unspecified: Secondary | ICD-10-CM | POA: Diagnosis not present

## 2014-12-13 NOTE — Progress Notes (Signed)
Patient ID: Blake Greenman Sr., male   DOB: 11-30-1950, 64 y.o.   MRN: [id number]    HISTORY AND PHYSICAL   DATE: 12/13/14  Location:  [facility] of Service: SNF [phone])   Extended Emergency Contact Information Primary Emergency Contact: [name],[name] Address: [address]          [place], [place] [address] [place] of [place] Phone: [phone] Mobile Phone: [phone] Relation: Spouse Secondary Emergency Contact: [name] of [place] Mobile Phone: [phone] Relation: Son  Advanced Directive information  FULL CODE  Chief Complaint  Patient presents with  . New Admit To SNF    HPI:  64 yo male seen today as a new admission into SNF following hospital stay for acute ischemic CVA involving left MCA, DM2, HTN, OSA on CPAP, acute on CKD stage 3, hyperlipidemia. Hospital records reviewed. MRI revealed left acute MCA nonhemorrhagic stroke. 2D echo showed low nml EF with dilated RV and nml appearing LV. He was placed on plavix  He c/o right sided weakness and intermittent confusion. Wife present today. Unable to obtain further HPI due to expressive aphasia. Hx obtained from chart  DM - taking albiglutide and lantus.  HTN - stable on amlodipine, azilsartan, bidil, lasix, and labetalol.  OSA - on CPAP qhs  Hyperlipidemia - takes statin  CKD - last Cr 3.16  Mood - on lexapro for depression  Elevated Prl - takes cabergoline 3 times per week  Past Medical History  Diagnosis Date  . Hypertension   . Multiple wounds     on lower legs - sees [name] Pain at the [facility]  . Hypercholesterolemia   . OSA on CPAP     uses cpap, setting of 5  . Prolactin secreting pituitary adenoma     [name] 12/02/2014  . Type II diabetes mellitus     uncontrolled/notes 12/02/2014  . Chronic kidney disease (CKD), stage III (moderate)     sees dr Florene Glen nephrology every 6-9 months  . CVA (cerebral vascular accident)  12/2013    admitted in July 2015 for acute right-sided infarct/notes 12/02/2014  . 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 Colonoscopy  6-7 yrs ago  . Colonoscopy N/A 04/05/2013    Procedure: COLONOSCOPY;  Surgeon: Juanita Craver, MD;  Location: [facility];  Service: Endoscopy;  Laterality: N/A;    Patient Care Team: Roselee Culver, MD as PCP - General (Family Medicine)  History   Social History  . Marital Status: Married    Spouse Name: N/A  . Number of Children: 3  . Years of Education: 12th    Occupational History  . part-time/full self emp.    Social History Main Topics  . Smoking status: Former Smoker -- 0.25 packs/day for 2 years    Types: Cigarettes    Quit date: 06/17/1970  . Smokeless tobacco: Never Used  . Alcohol Use: No  . Drug Use: Yes    Special: Marijuana     Comment: H/O marijuana use x 1 many years ago  . Sexual Activity: Yes   Other Topics Concern  . Not on file   Social History Narrative   Patient lives at home with his wife.   Patient right handed   Patient drinks soda's and coffee     reports that he quit smoking about 44 years ago. His smoking use included Cigarettes. He has a .5  pack-year smoking history. He has never used smokeless tobacco. He reports that he uses illicit drugs (Marijuana). He reports that he does not drink alcohol.  Family History  Problem Relation Age of Onset  . Diabetes Mother   . Hypertension Mother   . Diabetes Father   . Hypertension Father   . Diabetes Sister   . Diabetes Brother    Family Status  Relation Status Death Age  . Mother Alive   . Father Deceased     Immunization History  Administered Date(s) Administered  . Pneumococcal Polysaccharide-23 01/16/2014    No Known Allergies  Medications: Patient's Medications  New Prescriptions   No medications on file  Previous Medications   ALBIGLUTIDE (TANZEUM) 30 MG PEN    Inject 30 mg into  the skin once a week.   AMLODIPINE (NORVASC) 10 MG TABLET    Take 1 tablet (10 mg total) by mouth daily.   ATORVASTATIN (LIPITOR) 80 MG TABLET    Take 1 tablet (80 mg total) by mouth daily at 6 PM.   AZILSARTAN MEDOXOMIL (EDARBI) 40 MG TABS    Take 1 tablet by mouth daily.   CABERGOLINE (DOSTINEX) 0.5 MG TABLET    Take 0.5 tablets (0.25 mg total) by mouth 2 (two) times a week.   CLOPIDOGREL (PLAVIX) 75 MG TABLET    Take 1 tablet (75 mg total) by mouth daily with breakfast.   ESCITALOPRAM (LEXAPRO) 20 MG TABLET    Take 20 mg by mouth daily.    FUROSEMIDE (LASIX) 80 MG TABLET    Take 80 mg by mouth daily.    INSULIN GLARGINE (LANTUS) 100 UNIT/ML INJECTION    Inject 0.3 mLs (30 Units total) into the skin daily.   ISOSORBIDE-HYDRALAZINE (BIDIL) 20-37.5 MG PER TABLET    Take 2 tablets by mouth 3 (three) times daily.   LABETALOL (NORMODYNE) 200 MG TABLET    Take 200 mg by mouth 3 (three) times daily.  Modified Medications   No medications on file  Discontinued Medications   No medications on file    Review of Systems  Unable to perform ROS: Other  expressive aphasia  Filed Vitals:   12/13/14 1850  BP: 155/76  Pulse: 65  Temp: 97.5 F (36.4 C)  Weight: 335 lb (151.955 kg)  SpO2: 98%   Body mass index is 49.45 kg/(m^2).  Physical Exam  Constitutional: He appears well-developed and well-nourished.  Sitting on bed in NAD. Wife present  HENT:  Mouth/Throat: Oropharynx is clear and moist.  Eyes: Pupils are equal, round, and reactive to light. No scleral icterus.  Neck: Neck supple. Carotid bruit is not present. No thyromegaly present.  Cardiovascular: Normal rate, regular rhythm and intact distal pulses.  Exam reveals no gallop and no friction rub.   Murmur (1/6 SEM) heard. +1 pitting LE edema b/l. No calf TTP  Pulmonary/Chest: Effort normal. He has no wheezes. He exhibits no tenderness.  Congested BS that clear with cough  Abdominal: Soft. Bowel sounds are normal. He exhibits no  distension, no abdominal bruit, no pulsatile midline mass and no mass. There is no tenderness. There is no rebound and no guarding.  Musculoskeletal: He exhibits edema.  Lymphadenopathy:    He has no cervical adenopathy.  Neurological: He is alert.  (+) expressive aphasia; reduced right grip strength  Skin: Skin is warm and dry. No rash noted.  Psychiatric: He has a normal mood and affect. His behavior is normal.     Labs reviewed: Admission on  12/02/2014, Discharged on 12/06/2014  Component Date Value Ref Range Status  . WBC 12/02/2014 9.9  4.0 - 10.5 K/uL Final  . RBC 12/02/2014 4.14* 4.22 - 5.81 MIL/uL Final  . Hemoglobin 12/02/2014 11.7* 13.0 - 17.0 g/dL Final  . HCT 12/02/2014 36.2* 39.0 - 52.0 % Final  . MCV 12/02/2014 87.4  78.0 - 100.0 fL Final  . MCH 12/02/2014 28.3  26.0 - 34.0 pg Final  . MCHC 12/02/2014 32.3  30.0 - 36.0 g/dL Final  . RDW 12/02/2014 18.0* 11.5 - 15.5 % Final  . Platelets 12/02/2014 354  150 - 400 K/uL Final  . Neutrophils Relative % 12/02/2014 84* 43 - 77 % Final  . Neutro Abs 12/02/2014 8.3* 1.7 - 7.7 K/uL Final  . Lymphocytes Relative 12/02/2014 10* 12 - 46 % Final  . Lymphs Abs 12/02/2014 1.0  0.7 - 4.0 K/uL Final  . Monocytes Relative 12/02/2014 4  3 - 12 % Final  . Monocytes Absolute 12/02/2014 0.4  0.1 - 1.0 K/uL Final  . Eosinophils Relative 12/02/2014 1  0 - 5 % Final  . Eosinophils Absolute 12/02/2014 0.1  0.0 - 0.7 K/uL Final  . Basophils Relative 12/02/2014 1  0 - 1 % Final  . Basophils Absolute 12/02/2014 0.1  0.0 - 0.1 K/uL Final  . Sodium 12/02/2014 139  135 - 145 mmol/L Final  . Potassium 12/02/2014 3.9  3.5 - 5.1 mmol/L Final  . Chloride 12/02/2014 107  101 - 111 mmol/L Final  . CO2 12/02/2014 24  22 - 32 mmol/L Final  . Glucose, Bld 12/02/2014 177* 65 - 99 mg/dL Final  . BUN 12/02/2014 27* 6 - 20 mg/dL Final  . Creatinine, Ser 12/02/2014 3.16* 0.61 - 1.24 mg/dL Final  . Calcium 12/02/2014 8.7* 8.9 - 10.3 mg/dL Final  . GFR calc  non Af Amer 12/02/2014 19* >60 mL/min Final  . GFR calc Af Amer 12/02/2014 23* >60 mL/min Final   Comment: (NOTE) The eGFR has been calculated using the CKD EPI equation. This calculation has not been validated in all clinical situations. eGFR's persistently <60 mL/min signify possible Chronic Kidney Disease.   . Anion gap 12/02/2014 8  5 - 15 Final  . Troponin I 12/02/2014 <0.03  <0.031 ng/mL Final   Comment:        NO INDICATION OF MYOCARDIAL INJURY.   . Glucose-Capillary 12/02/2014 136* 65 - 99 mg/dL Final  . Hgb A1c MFr Bld 12/03/2014 7.1* 4.8 - 5.6 % Final   Comment: (NOTE)         Pre-diabetes: 5.7 - 6.4         Diabetes: >6.4         Glycemic control for adults with diabetes: <7.0   . Mean Plasma Glucose 12/03/2014 157   Final   Comment: (NOTE) A duplicate report has been generated due to demographic updates. Performed At: [facility] [place], [place] [id number] Lindon Romp MD [id number]   . Cholesterol 12/03/2014 243* 0 - 200 mg/dL Final  . Triglycerides 12/03/2014 113  <150 mg/dL Final  . HDL 12/03/2014 51  >40 mg/dL Final  . Total CHOL/HDL Ratio 12/03/2014 4.8   Final  . VLDL 12/03/2014 23  0 - 40 mg/dL Final  . LDL Cholesterol 12/03/2014 169* 0 - 99 mg/dL Final   Comment:        Total Cholesterol/HDL:CHD Risk Coronary Heart Disease Risk Table  Men   Women  1/2 Average Risk   3.4   3.3  Average Risk       5.0   4.4  2 X Average Risk   9.6   7.1  3 X Average Risk  23.4   11.0        Use the calculated Patient Ratio above and the CHD Risk Table to determine the patient's CHD Risk.        ATP III CLASSIFICATION (LDL):  <100     mg/dL   Optimal  100-129  mg/dL   Near or Above                    Optimal  130-159  mg/dL   Borderline  160-189  mg/dL   High  >190     mg/dL   Very High   . Sodium 12/03/2014 140  135 - 145 mmol/L Final  . Potassium 12/03/2014 3.5  3.5 - 5.1 mmol/L Final  . Chloride 12/03/2014  105  101 - 111 mmol/L Final  . CO2 12/03/2014 26  22 - 32 mmol/L Final  . Glucose, Bld 12/03/2014 123* 65 - 99 mg/dL Final  . BUN 12/03/2014 22* 6 - 20 mg/dL Final  . Creatinine, Ser 12/03/2014 2.86* 0.61 - 1.24 mg/dL Final  . Calcium 12/03/2014 9.0  8.9 - 10.3 mg/dL Final  . GFR calc non Af Amer 12/03/2014 22* >60 mL/min Final  . GFR calc Af Amer 12/03/2014 25* >60 mL/min Final   Comment: (NOTE) The eGFR has been calculated using the CKD EPI equation. This calculation has not been validated in all clinical situations. eGFR's persistently <60 mL/min signify possible Chronic Kidney Disease.   . Anion gap 12/03/2014 9  5 - 15 Final  . Glucose-Capillary 12/02/2014 130* 65 - 99 mg/dL Final  . Comment 1 12/02/2014 Notify RN   Final  . Comment 2 12/02/2014 Document in Chart   Final  . MRSA by PCR 12/02/2014 POSITIVE* NEGATIVE Final   Comment:        The GeneXpert MRSA Assay (FDA approved for NASAL specimens only), is one component of a comprehensive MRSA colonization surveillance program. It is not intended to diagnose MRSA infection nor to guide or monitor treatment for MRSA infections. RESULT CALLED TO, READ BACK BY AND VERIFIED WITH: A THOMPSON,RN [id number] [place]   . Glucose-Capillary 12/03/2014 106* 65 - 99 mg/dL Final  . Comment 1 12/03/2014 Notify RN   Final  . Comment 2 12/03/2014 Document in Chart   Final  . Glucose-Capillary 12/03/2014 129* 65 - 99 mg/dL Final  . Comment 1 12/03/2014 Notify RN   Final  . Comment 2 12/03/2014 Document in Chart   Final  . Glucose-Capillary 12/03/2014 123* 65 - 99 mg/dL Final  . Glucose-Capillary 12/03/2014 130* 65 - 99 mg/dL Final  . RPR Ser Ql 12/03/2014 Non Reactive  Non Reactive Final   Comment: (NOTE) Performed At: [facility] [place], [place] [id number] Lindon Romp MD [id number]   . Vitamin B-12 12/03/2014 421  180 - 914 pg/mL Final   Comment: (NOTE) This assay is not validated for testing  neonatal or myeloproliferative syndrome specimens for Vitamin B12 levels.   Marland Kitchen TSH 12/03/2014 3.182  0.350 - 4.500 uIU/mL Final  . HIV Screen 4th Generation wRfx 12/03/2014 Non Reactive  Non Reactive Final   Comment: (NOTE) Performed At: [facility] [place], [place] [id number] Lindon Romp MD [id number]   .  Glucose-Capillary 12/03/2014 221* 65 - 99 mg/dL Final  . Glucose-Capillary 12/03/2014 142* 65 - 99 mg/dL Final  . Comment 1 12/03/2014 Notify RN   Final  . Comment 2 12/03/2014 Document in Chart   Final  . Glucose-Capillary 12/04/2014 128* 65 - 99 mg/dL Final  . Comment 1 12/04/2014 Notify RN   Final  . Comment 2 12/04/2014 Document in Chart   Final  . WBC 12/05/2014 7.5  4.0 - 10.5 K/uL Final  . RBC 12/05/2014 4.06* 4.22 - 5.81 MIL/uL Final  . Hemoglobin 12/05/2014 11.3* 13.0 - 17.0 g/dL Final  . HCT 12/05/2014 35.0* 39.0 - 52.0 % Final  . MCV 12/05/2014 86.2  78.0 - 100.0 fL Final  . MCH 12/05/2014 27.8  26.0 - 34.0 pg Final  . MCHC 12/05/2014 32.3  30.0 - 36.0 g/dL Final  . RDW 12/05/2014 18.0* 11.5 - 15.5 % Final  . Platelets 12/05/2014 343  150 - 400 K/uL Final  . Sodium 12/05/2014 138  135 - 145 mmol/L Final  . Potassium 12/05/2014 3.4* 3.5 - 5.1 mmol/L Final  . Chloride 12/05/2014 104  101 - 111 mmol/L Final  . CO2 12/05/2014 25  22 - 32 mmol/L Final  . Glucose, Bld 12/05/2014 131* 65 - 99 mg/dL Final  . BUN 12/05/2014 26* 6 - 20 mg/dL Final  . Creatinine, Ser 12/05/2014 3.39* 0.61 - 1.24 mg/dL Final  . Calcium 12/05/2014 8.5* 8.9 - 10.3 mg/dL Final  . GFR calc non Af Amer 12/05/2014 18* >60 mL/min Final  . GFR calc Af Amer 12/05/2014 21* >60 mL/min Final   Comment: (NOTE) The eGFR has been calculated using the CKD EPI equation. This calculation has not been validated in all clinical situations. eGFR's persistently <60 mL/min signify possible Chronic Kidney Disease.   . Anion gap 12/05/2014 9  5 - 15 Final  . Glucose-Capillary  12/04/2014 131* 65 - 99 mg/dL Final  . Comment 1 12/04/2014 Document in Chart   Final  . Glucose-Capillary 12/04/2014 116* 65 - 99 mg/dL Final  . Glucose-Capillary 12/04/2014 145* 65 - 99 mg/dL Final  . Comment 1 12/04/2014 Notify RN   Final  . Comment 2 12/04/2014 Document in Chart   Final  . Glucose-Capillary 12/05/2014 136* 65 - 99 mg/dL Final  . Comment 1 12/05/2014 Notify RN   Final  . Comment 2 12/05/2014 Document in Chart   Final  . Glucose-Capillary 12/05/2014 148* 65 - 99 mg/dL Final  . Glucose-Capillary 12/05/2014 144* 65 - 99 mg/dL Final  . Magnesium 12/06/2014 2.0  1.7 - 2.4 mg/dL Final  . WBC 12/06/2014 7.3  4.0 - 10.5 K/uL Final  . RBC 12/06/2014 3.87* 4.22 - 5.81 MIL/uL Final  . Hemoglobin 12/06/2014 11.1* 13.0 - 17.0 g/dL Final  . HCT 12/06/2014 33.6* 39.0 - 52.0 % Final  . MCV 12/06/2014 86.8  78.0 - 100.0 fL Final  . MCH 12/06/2014 28.7  26.0 - 34.0 pg Final  . MCHC 12/06/2014 33.0  30.0 - 36.0 g/dL Final  . RDW 12/06/2014 18.1* 11.5 - 15.5 % Final  . Platelets 12/06/2014 341  150 - 400 K/uL Final  . Sodium 12/06/2014 138  135 - 145 mmol/L Final  . Potassium 12/06/2014 3.9  3.5 - 5.1 mmol/L Final  . Chloride 12/06/2014 107  101 - 111 mmol/L Final  . CO2 12/06/2014 24  22 - 32 mmol/L Final  . Glucose, Bld 12/06/2014 126* 65 - 99 mg/dL Final  . BUN 12/06/2014 32* 6 -  20 mg/dL Final  . Creatinine, Ser 12/06/2014 3.46* 0.61 - 1.24 mg/dL Final  . Calcium 12/06/2014 8.3* 8.9 - 10.3 mg/dL Final  . GFR calc non Af Amer 12/06/2014 17* >60 mL/min Final  . GFR calc Af Amer 12/06/2014 20* >60 mL/min Final   Comment: (NOTE) The eGFR has been calculated using the CKD EPI equation. This calculation has not been validated in all clinical situations. eGFR's persistently <60 mL/min signify possible Chronic Kidney Disease.   . Anion gap 12/06/2014 7  5 - 15 Final  . Glucose-Capillary 12/05/2014 119* 65 - 99 mg/dL Final  . Comment 1 12/05/2014 Notify RN   Final  . Comment 2  12/05/2014 Document in Chart   Final  . Glucose-Capillary 12/06/2014 123* 65 - 99 mg/dL Final  . Comment 1 12/06/2014 Notify RN   Final  . Comment 2 12/06/2014 Document in Chart   Final  . Glucose-Capillary 12/06/2014 146* 65 - 99 mg/dL Final  . Glucose-Capillary 12/06/2014 116* 65 - 99 mg/dL Final    [name]  12/02/2014   CLINICAL DATA:  Aphasia  EXAM: MRI HEAD WITHOUT CONTRAST  TECHNIQUE: Multiplanar, multiecho pulse sequences of the brain and surrounding structures were obtained without intravenous contrast.  COMPARISON:  01/13/2014 brain MRI  FINDINGS: Calvarium and upper cervical spine: No focal marrow signal abnormality.  Orbits: No significant findings.  Sinuses and Mastoids: Clear. Mastoid and middle ears are clear.  Brain: Restricted diffusion in the left upper putamen in the corona radiata consistent with acute perforator infarct. This is anterior to the acute infarct seen in 2015. Additionally, there has been a remote hemorrhagic infarct in the central left thalamus which has occurred since prior. Chronic vessel disease which is extensive for age, with confluent T2 and FLAIR hyperintensity around the lateral ventricles and an additional lacunar infarct in the right corona radiata. Chronic ischemic change is present throughout the pons. 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 No evidence of major vessel occlusion, acute hemorrhage, or obstructive hydrocephalus.  IMPRESSION: 1. Acute, nonhemorrhagic perforator infarct affecting the left upper putamen and corona radiata. 2. 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 A lacunar infarct in the left thalamus is chronic but has occurred since most recent imaging 01/13/2014.   Electronically Signed   By: Monte Fantasia M.D.   On: 12/02/2014 16:15   [name]  12/03/2014   CLINICAL DATA:  Stroke  EXAM: MRA HEAD WITHOUT CONTRAST  TECHNIQUE: Angiographic images of the Circle of Willis were obtained  using MRA technique without intravenous contrast.  COMPARISON:  MRI head 12/02/2014  FINDINGS: Right vertebral dominant and widely patent to the basilar. Right PICA patent. Left vertebral artery ends in PICA and does not contribute to the basilar. The basilar is tortuous and indents the left side of the pons. No basilar stenosis. Superior cerebellar artery patent bilaterally. Fetal origin of the right posterior cerebral artery with hypoplastic right P1 segment. Posterior cerebral arteries patent bilaterally without significant stenosis  Cavernous carotid widely patent bilaterally without stenosis. Anterior cerebral arteries patent bilaterally. Mild to moderate stenosis proximal right M1 segment. Right middle cerebral artery branches widely patent. Left middle cerebral artery widely patent.  Negative for cerebral aneurysm.  IMPRESSION: Mild to moderate stenosis proximal right M1 segment. Otherwise no significant intracranial stenosis.   Electronically Signed   By: Franchot Gallo M.D.   On: 12/03/2014 08:46     Assessment/Plan   ICD-9-CM ICD-10-CM   1. Confusion state -  ongoing 298.9 F44.89   2. Acute ischemic left MCA stroke - stable 434.91 I63.512   3. Essential hypertension, benign - stable 401.1 I10   4. Type II diabetes mellitus with neurological manifestations 250.60 E11.40   5. CKD (chronic kidney disease) stage 3, GFR 30-59 ml/min 585.3 N18.3   6. Hemiparesis affecting right side as late effect of cerebrovascular accident 438.20 I69.851   7. Morbid obesity 278.01 E66.01   8. OSA (obstructive sleep apnea) 327.23 G47.33   9. HLD (hyperlipidemia) 272.4 E78.5     --check BMP, CBC w diff and A1c in 1 week  --nephrology eval for CKD  --cont current meds as ordered  --PT/OT/ST as ordered  --CPAP qHS as indicated  --CBGs qAC and qHS  --GOAL: short term rehab and d/c home when medically appropriate. Communicated with pt and nursing.  --will follow  Dasha Kawabata S. Perlie Gold  [facility] [address] [place], [place] [address] [phone] Cell (Monday-Friday 8 AM - 5 PM) [phone] After 5 PM and follow prompts

## 2014-12-23 ENCOUNTER — Non-Acute Institutional Stay (SKILLED_NURSING_FACILITY): Payer: 59 | Admitting: Adult Health

## 2014-12-23 DIAGNOSIS — E1140 Type 2 diabetes mellitus with diabetic neuropathy, unspecified: Secondary | ICD-10-CM

## 2014-12-23 DIAGNOSIS — I63512 Cerebral infarction due to unspecified occlusion or stenosis of left middle cerebral artery: Secondary | ICD-10-CM | POA: Diagnosis not present

## 2014-12-23 DIAGNOSIS — E1149 Type 2 diabetes mellitus with other diabetic neurological complication: Secondary | ICD-10-CM

## 2014-12-23 DIAGNOSIS — G4733 Obstructive sleep apnea (adult) (pediatric): Secondary | ICD-10-CM | POA: Diagnosis not present

## 2014-12-23 DIAGNOSIS — N184 Chronic kidney disease, stage 4 (severe): Secondary | ICD-10-CM

## 2014-12-27 ENCOUNTER — Telehealth: Payer: Self-pay | Admitting: *Deleted

## 2014-12-27 ENCOUNTER — Ambulatory Visit (INDEPENDENT_AMBULATORY_CARE_PROVIDER_SITE_OTHER): Payer: 59 | Admitting: Family Medicine

## 2014-12-27 VITALS — BP 128/84 | HR 89 | Temp 97.4°F | Resp 22 | Ht 68.0 in | Wt 344.4 lb

## 2014-12-27 DIAGNOSIS — E1122 Type 2 diabetes mellitus with diabetic chronic kidney disease: Secondary | ICD-10-CM

## 2014-12-27 DIAGNOSIS — N189 Chronic kidney disease, unspecified: Secondary | ICD-10-CM | POA: Diagnosis not present

## 2014-12-27 DIAGNOSIS — E1365 Other specified diabetes mellitus with hyperglycemia: Secondary | ICD-10-CM | POA: Diagnosis not present

## 2014-12-27 DIAGNOSIS — R55 Syncope and collapse: Secondary | ICD-10-CM | POA: Diagnosis not present

## 2014-12-27 DIAGNOSIS — N183 Chronic kidney disease, stage 3 (moderate): Secondary | ICD-10-CM

## 2014-12-27 DIAGNOSIS — I69320 Aphasia following cerebral infarction: Secondary | ICD-10-CM | POA: Diagnosis not present

## 2014-12-27 DIAGNOSIS — Z79899 Other long term (current) drug therapy: Secondary | ICD-10-CM

## 2014-12-27 DIAGNOSIS — D631 Anemia in chronic kidney disease: Secondary | ICD-10-CM | POA: Diagnosis not present

## 2014-12-27 DIAGNOSIS — I69920 Aphasia following unspecified cerebrovascular disease: Secondary | ICD-10-CM | POA: Diagnosis not present

## 2014-12-27 DIAGNOSIS — R112 Nausea with vomiting, unspecified: Secondary | ICD-10-CM

## 2014-12-27 DIAGNOSIS — R1084 Generalized abdominal pain: Secondary | ICD-10-CM | POA: Diagnosis not present

## 2014-12-27 DIAGNOSIS — I129 Hypertensive chronic kidney disease with stage 1 through stage 4 chronic kidney disease, or unspecified chronic kidney disease: Secondary | ICD-10-CM | POA: Diagnosis not present

## 2014-12-27 DIAGNOSIS — IMO0002 Reserved for concepts with insufficient information to code with codable children: Secondary | ICD-10-CM

## 2014-12-27 DIAGNOSIS — E1165 Type 2 diabetes mellitus with hyperglycemia: Secondary | ICD-10-CM

## 2014-12-27 DIAGNOSIS — F329 Major depressive disorder, single episode, unspecified: Secondary | ICD-10-CM | POA: Diagnosis not present

## 2014-12-27 DIAGNOSIS — E1322 Other specified diabetes mellitus with diabetic chronic kidney disease: Secondary | ICD-10-CM

## 2014-12-27 DIAGNOSIS — N184 Chronic kidney disease, stage 4 (severe): Secondary | ICD-10-CM | POA: Diagnosis not present

## 2014-12-27 LAB — POCT CBC
Granulocyte percent: 87.9 %G — AB (ref 37–80)
HCT, POC: 39.3 % — AB (ref 43.5–53.7)
Hemoglobin: 12.6 g/dL — AB (ref 14.1–18.1)
Lymph, poc: 0.6 (ref 0.6–3.4)
MCH, POC: 28 pg (ref 27–31.2)
MCHC: 32.2 g/dL (ref 31.8–35.4)
MCV: 87.1 fL (ref 80–97)
MID (CBC): 0.3 (ref 0–0.9)
MPV: 6.3 fL (ref 0–99.8)
POC Granulocyte: 6.6 (ref 2–6.9)
POC LYMPH %: 7.5 % — AB (ref 10–50)
POC MID %: 4.6 %M (ref 0–12)
Platelet Count, POC: 447 10*3/uL — AB (ref 142–424)
RBC: 4.51 M/uL — AB (ref 4.69–6.13)
RDW, POC: 18.3 %
WBC: 7.5 10*3/uL (ref 4.6–10.2)

## 2014-12-27 LAB — COMPREHENSIVE METABOLIC PANEL
ALT: 16 U/L (ref 0–53)
AST: 17 U/L (ref 0–37)
Albumin: 3.7 g/dL (ref 3.5–5.2)
Alkaline Phosphatase: 73 U/L (ref 39–117)
BILIRUBIN TOTAL: 0.5 mg/dL (ref 0.2–1.2)
BUN: 36 mg/dL — ABNORMAL HIGH (ref 6–23)
CHLORIDE: 108 meq/L (ref 96–112)
CO2: 23 mEq/L (ref 19–32)
Calcium: 9.3 mg/dL (ref 8.4–10.5)
Creat: 3.05 mg/dL — ABNORMAL HIGH (ref 0.50–1.35)
Glucose, Bld: 116 mg/dL — ABNORMAL HIGH (ref 70–99)
POTASSIUM: 3.9 meq/L (ref 3.5–5.3)
Sodium: 143 mEq/L (ref 135–145)
TOTAL PROTEIN: 7.2 g/dL (ref 6.0–8.3)

## 2014-12-27 LAB — LIPASE: Lipase: 18 U/L (ref 0–75)

## 2014-12-27 LAB — GLUCOSE, POCT (MANUAL RESULT ENTRY): POC GLUCOSE: 133 mg/dL — AB (ref 70–99)

## 2014-12-27 MED ORDER — ONDANSETRON 4 MG PO TBDP: 4.0000 mg | ORAL_TABLET | Freq: Three times a day (TID) | ORAL | Status: DC | PRN

## 2014-12-27 MED ORDER — FUROSEMIDE 80 MG PO TABS: 80.0000 mg | ORAL_TABLET | Freq: Every day | ORAL | Status: DC

## 2014-12-27 MED ORDER — CLOPIDOGREL BISULFATE 75 MG PO TABS: 75.0000 mg | ORAL_TABLET | Freq: Every day | ORAL | Status: DC

## 2014-12-27 MED ORDER — SYRINGE (DISPOSABLE) 1 ML MISC: Status: DC

## 2014-12-27 MED ORDER — AMLODIPINE BESYLATE 10 MG PO TABS: 10.0000 mg | ORAL_TABLET | Freq: Every day | ORAL | Status: DC

## 2014-12-27 MED ORDER — ISOSORB DINITRATE-HYDRALAZINE 20-37.5 MG PO TABS: 2.0000 | ORAL_TABLET | Freq: Three times a day (TID) | ORAL | Status: DC

## 2014-12-27 MED ORDER — INSULIN GLARGINE 100 UNIT/ML ~~LOC~~ SOLN: 30.0000 [IU] | Freq: Every day | SUBCUTANEOUS | Status: DC

## 2014-12-27 MED ORDER — PROMETHAZINE HCL 25 MG/ML IJ SOLN
25.0000 mg | Freq: Once | INTRAMUSCULAR | Status: AC
  Administered 2014-12-27: 25 mg via INTRAMUSCULAR

## 2014-12-27 MED ORDER — LABETALOL HCL 200 MG PO TABS: 200.0000 mg | ORAL_TABLET | Freq: Three times a day (TID) | ORAL | Status: DC

## 2014-12-27 MED ORDER — ALBIGLUTIDE 30 MG ~~LOC~~ PEN: 30.0000 mg | PEN_INJECTOR | SUBCUTANEOUS | Status: DC

## 2014-12-27 MED ORDER — CABERGOLINE 0.5 MG PO TABS: 0.2500 mg | ORAL_TABLET | ORAL | Status: DC

## 2014-12-27 MED ORDER — ATORVASTATIN CALCIUM 80 MG PO TABS: 80.0000 mg | ORAL_TABLET | Freq: Every day | ORAL | Status: DC

## 2014-12-27 MED ORDER — ESCITALOPRAM OXALATE 20 MG PO TABS: 20.0000 mg | ORAL_TABLET | Freq: Every day | ORAL | Status: DC

## 2014-12-27 NOTE — Patient Instructions (Addendum)
We are always HAPPY to see you - no matter what - I am willing to be your primary care doctor BUT I would HIGHLY recommend that you see an internist/geritrician as your PCP - I am happy to work together with them - that way if you get sick or need anything urgently, you are always welcome to come to our walk-in clinic but having a physician who specializes in complicated cases like yours will be well worth your time and energy.  Please call [facility] - Dr. Mariea Clonts and Dr. Bubba Camp are accepting new patients and they are wonderful.  They are in the same chart system and so we can easily work with them.   [facility] [address] [place], [place] [address] [phone]

## 2014-12-27 NOTE — Progress Notes (Addendum)
Subjective:  This chart was scribed for Blake Cheadle, MD by [name], medical scribe at [facility].The patient was seen in exam room 05 and the patient's care was started at 11:33 AM.   Patient ID: Blake Ina Sr., male    DOB: 05-08-1951, 64 y.o.   MRN: [id number] Chief Complaint  Patient presents with  . Follow-up    Pt has just came out of nursing home [facility])   HPI HPI Comments: Blake Arps Sr. is a 64 y.o. male who presents to [facility] complaining of seen in clinic 5 months to establish care for HTN, diabetes (insulin dependent), OSA, and a history of CVA. Followed by cardiology, [name]. He has a history chronic kidney disease stage III, baseline creatinine was 2.5 followed by nephrologist [name]. He has a history of seizures and pituitary prolactinoma.   Last month he was hospitalized for four days after an acute stroke with aphasia and confusion. Pt was discharged needing 24 hour supervision, but he already used up his allowable home health so his wife has been constantly watching him. Started on Plavix and followed by neurology. His ARB was held during his hospitalization due to kidney injury, he was on Lipitor 80 mg but his LDL was 170. [facility] [place] [facility].  Today, his wife is concerned about the number of medications he is taking. He does need insulin needles refilled. His wife is concerned about increased urinary frequency. He was nauseous yesterday, he believes this is due the medications he is taking. Pt did vomit in the exam room today. He denies dizziness, lightheadedness, and back pain.  Past Medical History  Diagnosis Date  . Hypertension   . Multiple wounds     on lower legs - sees [name] Pain at the [place]  . Hypercholesterolemia   . OSA on CPAP     uses cpap, setting of 5  . Prolactin secreting pituitary adenoma     [name] 12/02/2014  . Type II  diabetes mellitus     uncontrolled/notes 12/02/2014  . Chronic kidney disease (CKD), stage III (moderate)     sees dr Florene Glen nephrology every 6-9 months  . CVA (cerebral vascular accident) 12/2013    admitted in July 2015 for acute right-sided infarct/notes 12/02/2014  . 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 amLODipine (NORVASC) 10 MG tablet Take 1 tablet (10 mg total) by mouth daily. 30 tablet 0  . Azilsartan Medoxomil (EDARBI) 40 MG TABS Take 1 tablet by mouth daily.    . cabergoline (DOSTINEX) 0.5 MG tablet Take 0.5 tablets (0.25 mg total) by mouth 2 (two) times a week. 10 tablet 0  . escitalopram (LEXAPRO) 20 MG tablet Take 20 mg by mouth daily.     . furosemide (LASIX) 80 MG tablet Take 80 mg by mouth daily.     . insulin glargine (LANTUS) 100 UNIT/ML injection Inject 0.3 mLs (30 Units total) into the skin daily. 10 mL 11  . isosorbide-hydrALAZINE (BIDIL) 20-37.5 MG per tablet Take 2 tablets by mouth 3 (three) times daily.    Marland Kitchen labetalol (NORMODYNE) 200 MG tablet Take 200 mg by mouth 3 (three) times daily.    . Albiglutide (TANZEUM) 30 MG PEN Inject 30 mg into the skin once a week.    Marland Kitchen atorvastatin (LIPITOR) 80 MG  tablet Take 1 tablet (80 mg total) by mouth daily at 6 PM. (Patient not taking: Reported on 12/27/2014) 30 tablet 0  . clopidogrel (PLAVIX) 75 MG tablet Take 1 tablet (75 mg total) by mouth daily with breakfast. 30 tablet 0   No current facility-administered medications on file prior to visit.   No Known Allergies  Review of Systems  Gastrointestinal: Positive for nausea and vomiting.  Genitourinary: Positive for frequency.  Musculoskeletal: Negative for back pain.  Neurological: Negative for dizziness and light-headedness.      Objective:  BP 170/92 mmHg  Pulse 89  Temp(Src) 97.4 F (36.3 C) (Oral)  Resp 22  Ht 5\' 8"  (1.727 m)  Wt 344 lb 6.4 oz (156.219 kg)  BMI 52.38  kg/m2  SpO2 98% Physical Exam  Constitutional: He is oriented to person, place, and time. He appears well-developed and well-nourished. No distress.  HENT:  Head: Normocephalic and atraumatic.  Eyes: Pupils are equal, round, and reactive to light.  Neck: Normal range of motion.  Cardiovascular: Normal rate and regular rhythm.   Pulmonary/Chest: Effort normal. No respiratory distress.  Musculoskeletal: Normal range of motion.  Neurological: He is alert and oriented to person, place, and time.  Skin: Skin is warm and dry.  Psychiatric: He has a normal mood and affect. His behavior is normal.  Nursing note and vitals reviewed.  During exam, pt suddenly got very nauseated and had large amount of projectile emesis of bile and oatmeal across room    Repeat BP Rt arm large cuff manual 128/86; Lt arm 126/84 EKG:  NSR, no change from baseline. Assessment & Plan:  Restart  1. Non-intractable vomiting with nausea, vomiting of unspecified type - rx'ed prn zofran -  Wife suspects this is due to large number of new meds he is taking every morning.   2. Generalized abdominal pain - pt unable to collect UA in office - will bring back and drop off if sxs worsen.  3. Polypharmacy -  I reviewed entire current med list wife brought with her in comparison to that at [facility] d/c and from SNF d/c [name]).  Re-wrote meds into word document for pt - on sev tid meds so distributed all once daily medicines between the tid sched.  4. 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  5. Anemia in chronic kidney disease   6. Pre-syncope - after promethazine IM in office   Pt's wife is full-time caregiver as well as working full time so that they can maintain health ins.  His adult children are able to help a little.  Wife has a very low health literacy and is VERY overwhelmed.  Wife very concerned about telling their insurance who pcp  is and get permission - randomly chose dr. [name] here who cannot file w/ pt's insurance so wife very upset.  Pt REALLY needs a geriatrician or at very list an internist so advised that they should establish with [facility] which pt and wife agree to.    Had home PT today pt was in office sev hrs later than they planned - PT was upset that pt did not cancel in a sufficient amount of time so I talked w/ PT who agreed to resched initial visit for the next few d. His insurance has told him that they can send out a nurse occ to help monitor CKD.  Orders Placed This  Encounter  Procedures  . Lipase  . Comprehensive metabolic panel    Order Specific Question:  Has the patient fasted?    Answer:  No  . Ambulatory referral to Endocrinology    Referral Priority:  Routine    Referral Type:  Consultation    Referral Reason:  Specialty Services Required    Number of Visits Requested:  1  . POCT CBC  . POCT UA - Microscopic Only  . POCT urinalysis dipstick  . POCT glucose (manual entry)  . EKG 12-Lead    Meds ordered this encounter  Medications  . aspirin 325 MG tablet    Sig: Take 325 mg by mouth daily.  . promethazine (PHENERGAN) injection 25 mg    Sig:   . ondansetron (ZOFRAN ODT) 4 MG disintegrating tablet    Sig: Take 1 tablet (4 mg total) by mouth every 8 (eight) hours as needed for nausea or vomiting.    Dispense:  20 tablet    Refill:  0   Over 40 min spent in face-to-face evaluation of and consultation with patient and coordination of care.  Over 50% of this time was spent counseling this patient.  I personally performed the services described in this documentation, which was scribed in my presence. The recorded information has been reviewed and considered, and addended by me as needed.  Blake Cheadle, MD MPH  Results for orders placed or performed in visit on 12/27/14  Lipase  Result Value Ref Range   Lipase 18 0 - 75 U/L  Comprehensive metabolic panel  Result Value Ref  Range   Sodium 143 135 - 145 mEq/L   Potassium 3.9 3.5 - 5.3 mEq/L   Chloride 108 96 - 112 mEq/L   CO2 23 19 - 32 mEq/L   Glucose, Bld 116 (H) 70 - 99 mg/dL   BUN 36 (H) 6 - 23 mg/dL   Creat 3.05 (H) 0.50 - 1.35 mg/dL   Total Bilirubin 0.5 0.2 - 1.2 mg/dL   Alkaline Phosphatase 73 39 - 117 U/L   AST 17 0 - 37 U/L   ALT 16 0 - 53 U/L   Total Protein 7.2 6.0 - 8.3 g/dL   Albumin 3.7 3.5 - 5.2 g/dL   Calcium 9.3 8.4 - 10.5 mg/dL  POCT CBC  Result Value Ref Range   WBC 7.5 4.6 - 10.2 K/uL   Lymph, poc 0.6 0.6 - 3.4   POC LYMPH PERCENT 7.5 (A) 10 - 50 %L   MID (cbc) 0.3 0 - 0.9   POC MID % 4.6 0 - 12 %M   POC Granulocyte 6.6 2 - 6.9   Granulocyte percent 87.9 (A) 37 - 80 %G   RBC 4.51 (A) 4.69 - 6.13 M/uL   Hemoglobin 12.6 (A) 14.1 - 18.1 g/dL   HCT, POC 39.3 (A) 43.5 - 53.7 %   MCV 87.1 80 - 97 fL   MCH, POC 28.0 27 - 31.2 pg   MCHC 32.2 31.8 - 35.4 g/dL   RDW, POC 18.3 %   Platelet Count, POC 447 (A) 142 - 424 K/uL   MPV 6.3 0 - 99.8 fL  POCT glucose (manual entry)  Result Value Ref Range   POC Glucose 133 (A) 70 - 99 mg/dl

## 2014-12-28 ENCOUNTER — Encounter: Payer: Self-pay | Admitting: Family Medicine

## 2014-12-28 DIAGNOSIS — E1165 Type 2 diabetes mellitus with hyperglycemia: Secondary | ICD-10-CM

## 2014-12-28 DIAGNOSIS — IMO0002 Reserved for concepts with insufficient information to code with codable children: Secondary | ICD-10-CM | POA: Insufficient documentation

## 2014-12-28 DIAGNOSIS — N184 Chronic kidney disease, stage 4 (severe): Secondary | ICD-10-CM | POA: Insufficient documentation

## 2014-12-28 DIAGNOSIS — E1122 Type 2 diabetes mellitus with diabetic chronic kidney disease: Secondary | ICD-10-CM

## 2015-01-02 ENCOUNTER — Telehealth: Payer: Self-pay

## 2015-01-02 MED ORDER — ALBIGLUTIDE 30 MG ~~LOC~~ PEN: 30.0000 mg | PEN_INJECTOR | SUBCUTANEOUS | Status: DC

## 2015-01-02 NOTE — Telephone Encounter (Signed)
Pt is requesting a refill of TANZEUM 30 MG AND NEEDLES. Please send [facility] to [facility] [address]. She also wants to let Dr. Brigitte Pulse know that [facility] does NOT have this rx in stock. For future reference.

## 2015-01-02 NOTE — Telephone Encounter (Signed)
Dr. Brigitte Pulse, I have never heard of this medication. Did you want to refill?

## 2015-01-02 NOTE — Telephone Encounter (Addendum)
Pt's wife - working full-time and his caregiver - is VERY stressed out about the numerous amount of care pt needs.  He needs to call [facility] to get appointment to est a PCP there and he is supposed to be seeing Dr. Chalmers Cater to manage his DM but no-showed his last appt.  I do not rx this DM med usually as I do not have any experience with it.  I did provide one refill at his visit last week to give pt time to get established with a doctor (either [facility] or [facility]) to manage this but will need to be seen for any additional refills on this medication.  It appears that the tanzeum comes in a pen so does not need syringes - maybe they need pen needles? (which is fine to call in if needed). Also for future reference, please let wife know that if a pharmacy does not have the med they want, they can have the pharmacy transfer it to another without having to contact us.  I went ahead and [facility] her.

## 2015-01-03 NOTE — Telephone Encounter (Signed)
Left message for pt to call back  °

## 2015-01-04 NOTE — Telephone Encounter (Signed)
Spoke with pt, he seemed confused so I told him to have his wife call me back.

## 2015-01-05 ENCOUNTER — Ambulatory Visit: Payer: 59 | Admitting: Urgent Care

## 2015-01-05 ENCOUNTER — Other Ambulatory Visit: Payer: Self-pay | Admitting: Family Medicine

## 2015-01-06 ENCOUNTER — Telehealth: Payer: Self-pay

## 2015-01-06 NOTE — Telephone Encounter (Signed)
A speech therapist for [facility] called to request an order for speech therapy for the patient for one time a week for one week and two times a week for two weeks.  The patient typically sees Dr. [name], but the patient has [facility], so the speech therapist wanted to know if another provider here could complete the order on his behalf.  Contact number for the speech therapist, Tye Maryland: [phone]

## 2015-01-10 NOTE — Telephone Encounter (Signed)
error 

## 2015-01-11 ENCOUNTER — Telehealth: Payer: Self-pay

## 2015-01-11 NOTE — Telephone Encounter (Signed)
I already placed a referral at his last (and only) visit with me on 7/13.  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 Eulas Post there to [place] 03/01/15 and I have happily agreed to function as PCP in the interim to try to minimize barriers and restrictions to needed care.  I am always happy to see pt in the urgent care for urgent issues if he is unable to get into [facility] for these going forward (e.g. Nights/weekends).

## 2015-01-11 NOTE — Telephone Encounter (Signed)
[facility] called. This is a pt of their's, but he hasn't been seen there since getting [facility]. The provider listed is Dr. Brigitte Pulse. Can you do a referral to [facility] please? Pt has an appt with them 02/17/15

## 2015-01-12 NOTE — Telephone Encounter (Signed)
Crystal can you check on this?

## 2015-01-13 NOTE — Telephone Encounter (Signed)
Patient's wife called to see is anyone has called her back.

## 2015-01-13 NOTE — Telephone Encounter (Signed)
Spoke with pt, advised message from Dr. Brigitte Pulse. Wife understood.

## 2015-02-06 ENCOUNTER — Ambulatory Visit (INDEPENDENT_AMBULATORY_CARE_PROVIDER_SITE_OTHER): Payer: 59 | Admitting: Emergency Medicine

## 2015-02-06 VITALS — BP 160/90 | HR 70 | Temp 98.0°F | Resp 18 | Ht 68.0 in | Wt 353.4 lb

## 2015-02-06 DIAGNOSIS — E1149 Type 2 diabetes mellitus with other diabetic neurological complication: Secondary | ICD-10-CM

## 2015-02-06 DIAGNOSIS — E1140 Type 2 diabetes mellitus with diabetic neuropathy, unspecified: Secondary | ICD-10-CM | POA: Diagnosis not present

## 2015-02-06 DIAGNOSIS — E1365 Other specified diabetes mellitus with hyperglycemia: Secondary | ICD-10-CM

## 2015-02-06 DIAGNOSIS — E1322 Other specified diabetes mellitus with diabetic chronic kidney disease: Secondary | ICD-10-CM

## 2015-02-06 DIAGNOSIS — N184 Chronic kidney disease, stage 4 (severe): Secondary | ICD-10-CM | POA: Diagnosis not present

## 2015-02-06 DIAGNOSIS — I6350 Cerebral infarction due to unspecified occlusion or stenosis of unspecified cerebral artery: Secondary | ICD-10-CM | POA: Diagnosis not present

## 2015-02-06 DIAGNOSIS — E1165 Type 2 diabetes mellitus with hyperglycemia: Secondary | ICD-10-CM

## 2015-02-06 DIAGNOSIS — I639 Cerebral infarction, unspecified: Secondary | ICD-10-CM

## 2015-02-06 DIAGNOSIS — E1122 Type 2 diabetes mellitus with diabetic chronic kidney disease: Secondary | ICD-10-CM

## 2015-02-06 DIAGNOSIS — IMO0002 Reserved for concepts with insufficient information to code with codable children: Secondary | ICD-10-CM

## 2015-02-06 LAB — POCT GLYCOSYLATED HEMOGLOBIN (HGB A1C): Hemoglobin A1C: 6.2

## 2015-02-06 MED ORDER — FUROSEMIDE 80 MG PO TABS: 80.0000 mg | ORAL_TABLET | Freq: Every day | ORAL | Status: DC

## 2015-02-06 MED ORDER — CABERGOLINE 0.5 MG PO TABS: 0.2500 mg | ORAL_TABLET | ORAL | Status: DC

## 2015-02-06 MED ORDER — AZILSARTAN MEDOXOMIL 40 MG PO TABS: 1.0000 | ORAL_TABLET | Freq: Every day | ORAL | Status: DC

## 2015-02-06 MED ORDER — CLOPIDOGREL BISULFATE 75 MG PO TABS: 75.0000 mg | ORAL_TABLET | Freq: Every day | ORAL | Status: DC

## 2015-02-06 NOTE — Progress Notes (Addendum)
Subjective:  Patient ID: Blake Ina Sr., male    DOB: 05/06/1951  Age: 64 y.o. MRN: [id number]  CC: Follow-up and Medication Refill   HPI Blake Pierre Sr. presents  for refill of his medications. He is recently had a stroke and is doing reasonably well without he has no new symptoms develop referable. His diabetes is under reasonable control. He's tolerating his medications well and has no adverse effect his wife is somewhat confused about the different medicines he is on but he is taking as directed.  History Blake Bell has a past medical history of Hypertension; Multiple wounds; Hypercholesterolemia; OSA on CPAP; Prolactin secreting pituitary adenoma; Type II diabetes mellitus; Chronic kidney disease (CKD), stage III (moderate); CVA (cerebral vascular accident) (12/2013); and Stroke (12/02/2014).   He has past surgical history that includes Colonoscopy (6-7 yrs ago) and Colonoscopy (N/A, 04/05/2013).   His  family history includes Diabetes in his brother, father, mother, and sister; Hypertension in his father and mother.  He   reports that he quit smoking about 44 years ago. His smoking use included Cigarettes. He has a .5 pack-year smoking history. He has never used smokeless tobacco. He reports that he uses illicit drugs (Marijuana). He reports that he does not drink alcohol.  Outpatient Prescriptions Prior to Visit  Medication Sig Dispense Refill  . Albiglutide (TANZEUM) 30 MG PEN Inject 30 mg into the skin once a week. 1 each 2  . amLODipine (NORVASC) 10 MG tablet Take 1 tablet (10 mg total) by mouth daily. 30 tablet 0  . aspirin 325 MG tablet Take 325 mg by mouth daily.    Marland Kitchen atorvastatin (LIPITOR) 80 MG tablet Take 1 tablet (80 mg total) by mouth daily at 6 PM. 30 tablet 0  . B-D ULTRAFINE III SHORT PEN 31G X 8 MM MISC USE WITH SOLOSTAR AND NOVOLOG 100 each 1  . escitalopram (LEXAPRO) 20 MG tablet Take 1 tablet (20 mg total) by mouth daily. 30 tablet 0  . insulin  glargine (LANTUS) 100 UNIT/ML injection Inject 0.3 mLs (30 Units total) into the skin daily. 10 mL 0  . isosorbide-hydrALAZINE (BIDIL) 20-37.5 MG per tablet Take 2 tablets by mouth 3 (three) times daily. 180 tablet 0  . labetalol (NORMODYNE) 200 MG tablet Take 1 tablet (200 mg total) by mouth 3 (three) times daily. 90 tablet 0  . Syringe, Disposable, 1 ML MISC Insulin syringes as needed to go with medications - lantus and tanzeum 200 each 11  . Azilsartan Medoxomil (EDARBI) 40 MG TABS Take 1 tablet by mouth daily.    . cabergoline (DOSTINEX) 0.5 MG tablet Take 0.5 tablets (0.25 mg total) by mouth 2 (two) times a week. 4 tablet 0  . clopidogrel (PLAVIX) 75 MG tablet Take 1 tablet (75 mg total) by mouth daily with breakfast. 30 tablet 0  . furosemide (LASIX) 80 MG tablet Take 1 tablet (80 mg total) by mouth daily. 30 tablet 0  . ondansetron (ZOFRAN ODT) 4 MG disintegrating tablet Take 1 tablet (4 mg total) by mouth every 8 (eight) hours as needed for nausea or vomiting. 20 tablet 0   No facility-administered medications prior to visit.    Social History   Social History  . Marital Status: Married    Spouse Name: N/A  . Number of Children: 3  . Years of Education: 12th    Occupational History  . part-time/full self emp.    Social History Main Topics  . Smoking status: Former Smoker -- 0.25  packs/day for 2 years    Types: Cigarettes    Quit date: 06/17/1970  . Smokeless tobacco: Never Used  . Alcohol Use: No  . Drug Use: Yes    Special: Marijuana     Comment: H/O marijuana use x 1 many years ago  . Sexual Activity: Yes   Other Topics Concern  . None   Social History Narrative   Patient lives at home with his wife.   Patient right handed   Patient drinks soda's and coffee     Review of Systems  Constitutional: Negative for fever, chills and appetite change.  HENT: Negative for congestion, ear pain, postnasal drip, sinus pressure and sore throat.   Eyes: Negative for pain  and redness.  Respiratory: Negative for cough, shortness of breath and wheezing.   Cardiovascular: Negative for leg swelling.  Gastrointestinal: Negative for nausea, vomiting, abdominal pain, diarrhea, constipation and blood in stool.  Endocrine: Negative for polyuria.  Genitourinary: Negative for dysuria, urgency, frequency and flank pain.  Musculoskeletal: Negative for gait problem.  Skin: Negative for rash.  Neurological: Negative for weakness and headaches.  Psychiatric/Behavioral: Negative for confusion and decreased concentration. The patient is not nervous/anxious.     Objective:  BP 160/90 mmHg  Pulse 70  Temp(Src) 98 F (36.7 C) (Oral)  Resp 18  Ht 5\' 8"  (1.727 m)  Wt 353 lb 6 oz (160.29 kg)  BMI 53.74 kg/m2  SpO2 98%  Physical Exam  Constitutional: He is oriented to person, place, and time. He appears well-developed and well-nourished. No distress.  HENT:  Head: Normocephalic and atraumatic.  Right Ear: External ear normal.  Left Ear: External ear normal.  Nose: Nose normal.  Eyes: Conjunctivae and EOM are normal. Pupils are equal, round, and reactive to light. No scleral icterus.  Neck: Normal range of motion. Neck supple. No tracheal deviation present.  Cardiovascular: Normal rate, regular rhythm and normal heart sounds.   Pulmonary/Chest: Effort normal. No respiratory distress. He has no wheezes. He has no rales.  Abdominal: He exhibits no mass. There is no tenderness. There is no rebound and no guarding.  Musculoskeletal: He exhibits no edema.  Lymphadenopathy:    He has no cervical adenopathy.  Neurological: He is alert and oriented to person, place, and time. Coordination normal.  Skin: Skin is warm and dry. No rash noted.  Psychiatric: He has a normal mood and affect. His behavior is normal.      Assessment & Plan:   Blake Bell was seen today for follow-up and medication refill.  Diagnoses and all orders for this visit:  Type II diabetes mellitus with  neurological manifestations -     POCT glycosylated hemoglobin (Hb A1C) -     Ambulatory referral to Geriatrics  Uncontrolled diabetes with stage 4 chronic kidney disease GFR 15-29 -     POCT glycosylated hemoglobin (Hb A1C) -     Ambulatory referral to Geriatrics  Acute ischemic stroke -     POCT glycosylated hemoglobin (Hb A1C) -     Ambulatory referral to Geriatrics  Other orders -     cabergoline (DOSTINEX) 0.5 MG tablet; Take 0.5 tablets (0.25 mg total) by mouth 2 (two) times a week. -     clopidogrel (PLAVIX) 75 MG tablet; Take 1 tablet (75 mg total) by mouth daily with breakfast. -     furosemide (LASIX) 80 MG tablet; Take 1 tablet (80 mg total) by mouth daily. -     Azilsartan Medoxomil (EDARBI) 40 MG  TABS; Take 1 tablet by mouth daily.   will see him back in 3 months  I have discontinued Blake Bell's ondansetron. I am also having him maintain his aspirin, atorvastatin, amLODipine, escitalopram, insulin glargine, isosorbide-hydrALAZINE, labetalol, Syringe (Disposable), Albiglutide, B-D ULTRAFINE III SHORT PEN, spironolactone, cabergoline, clopidogrel, furosemide, and Azilsartan Medoxomil.  Meds ordered this encounter  Medications  . spironolactone (ALDACTONE) 25 MG tablet    Sig: Take 25 mg by mouth daily.    Refill:  6  . cabergoline (DOSTINEX) 0.5 MG tablet    Sig: Take 0.5 tablets (0.25 mg total) by mouth 2 (two) times a week.    Dispense:  4 tablet    Refill:  5  . clopidogrel (PLAVIX) 75 MG tablet    Sig: Take 1 tablet (75 mg total) by mouth daily with breakfast.    Dispense:  30 tablet    Refill:  5  . furosemide (LASIX) 80 MG tablet    Sig: Take 1 tablet (80 mg total) by mouth daily.    Dispense:  30 tablet    Refill:  5  . Azilsartan Medoxomil (EDARBI) 40 MG TABS    Sig: Take 1 tablet by mouth daily.    Dispense:  30 tablet    Refill:  5   I want him to continue to continue taking his usual medications and refills were given. He was referred to  gynecology  Appropriate red flag conditions were discussed with the patient as well as actions that should be taken.  Patient expressed his understanding.  Follow-up: Return in about 3 months (around 05/09/2015).  Roselee Culver, MD   Results for orders placed or performed in visit on 02/06/15  POCT glycosylated hemoglobin (Hb A1C)  Result Value Ref Range   Hemoglobin A1C 6.2

## 2015-02-06 NOTE — Patient Instructions (Signed)

## 2015-02-21 ENCOUNTER — Telehealth: Payer: Self-pay

## 2015-02-21 NOTE — Telephone Encounter (Signed)
Dr [name], [place] faxed notice that Earnest Rosier has been [facility] by manufacturer. Do you want to send in an alternative?

## 2015-02-23 ENCOUNTER — Other Ambulatory Visit: Payer: Self-pay | Admitting: Nephrology

## 2015-02-23 DIAGNOSIS — D352 Benign neoplasm of pituitary gland: Secondary | ICD-10-CM

## 2015-02-23 DIAGNOSIS — E669 Obesity, unspecified: Secondary | ICD-10-CM

## 2015-02-23 DIAGNOSIS — N184 Chronic kidney disease, stage 4 (severe): Secondary | ICD-10-CM

## 2015-02-23 DIAGNOSIS — I639 Cerebral infarction, unspecified: Secondary | ICD-10-CM

## 2015-03-01 ENCOUNTER — Ambulatory Visit: Payer: 59 | Admitting: Internal Medicine

## 2015-03-01 NOTE — Telephone Encounter (Signed)
Called pharm to check on this and was told that they were able to find med at another location so nothing else needed.

## 2015-03-06 ENCOUNTER — Ambulatory Visit
Admission: RE | Admit: 2015-03-06 | Discharge: 2015-03-06 | Disposition: A | Discharge status: Home / Self Care | Payer: 59 | Source: Ambulatory Visit | Attending: Nephrology | Admitting: Nephrology

## 2015-03-06 DIAGNOSIS — N184 Chronic kidney disease, stage 4 (severe): Secondary | ICD-10-CM

## 2015-03-06 DIAGNOSIS — I639 Cerebral infarction, unspecified: Secondary | ICD-10-CM

## 2015-03-06 DIAGNOSIS — D352 Benign neoplasm of pituitary gland: Secondary | ICD-10-CM

## 2015-03-06 DIAGNOSIS — E669 Obesity, unspecified: Secondary | ICD-10-CM

## 2015-03-21 ENCOUNTER — Other Ambulatory Visit: Payer: Self-pay | Admitting: Family Medicine

## 2015-04-16 NOTE — Progress Notes (Signed)
Patient ID: Blake Holloway Sr., male   DOB: Feb 10, 1951, 64 y.o.   MRN: [id number]    Facility: [facility]      No Known Allergies  Chief Complaint  Patient presents with  . Discharge Note    HPI:  He is being discharged to home with home health for st and rn for cognition and diabetes management. He will not need dme. He will need his prescriptions to be written and will need a follow up with his pcp. He had been hospitalized for a cva. He was admitted to this facility for short term rehab.    Past Medical History  Diagnosis Date  . Hypertension   . Multiple wounds     on lower legs - sees [name] Pain at the [facility]  . Hypercholesterolemia   . OSA on CPAP     uses cpap, setting of 5  . Prolactin secreting pituitary adenoma     [name] 12/02/2014  . Type II diabetes mellitus     uncontrolled/notes 12/02/2014  . Chronic kidney disease (CKD), stage III (moderate)     sees dr Florene Glen nephrology every 6-9 months  . CVA (cerebral vascular accident) 12/2013    admitted in July 2015 for acute right-sided infarct/notes 12/02/2014  . 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 Colonoscopy  6-7 yrs ago  . Colonoscopy N/A 04/05/2013    Procedure: COLONOSCOPY;  Surgeon: Juanita Craver, MD;  Location: [facility];  Service: Endoscopy;  Laterality: N/A;    VITAL SIGNS BP 140/80 mmHg  Pulse 75  Ht 5\' 9"  (1.753 m)  Wt 348 lb (157.852 kg)  BMI 51.37 kg/m2  Patient's Medications  New Prescriptions   No medications on file  Previous Medications   . Albiglutide (TANZEUM) 30 MG PEN Inject 30 mg into the skin once a week.  Marland Kitchen amLODipine (NORVASC) 10 MG tablet Take 1 tablet (10 mg total) by mouth daily.  Marland Kitchen atorvastatin (LIPITOR) 80 MG tablet Take 1 tablet (80 mg total) by mouth daily at 6 PM.  . Azilsartan Medoxomil (EDARBI) 40 MG TABS Take 1 tablet by mouth daily.  . cabergoline (DOSTINEX) 0.5 MG tablet Take 0.5  tablets (0.25 mg total) by mouth 2 (two) times a week.  . clopidogrel (PLAVIX) 75 MG tablet Take 1 tablet (75 mg total) by mouth daily with breakfast.  . escitalopram (LEXAPRO) 20 MG tablet Take 20 mg by mouth daily.   . furosemide (LASIX) 80 MG tablet Take 80 mg by mouth daily.   . insulin glargine (LANTUS) 100 UNIT/ML injection Inject 0.3 mLs (30 Units total) into the skin daily.  . isosorbide-hydrALAZINE (BIDIL) 20-37.5 MG per tablet Take 2 tablets by mouth 3 (three) times daily.  Marland Kitchen labetalol (NORMODYNE) 200 MG tablet Take 200 mg by mouth 3 (three) times daily.     Modified Medications   No medications on file  Discontinued Medications   No medications on file     SIGNIFICANT DIAGNOSTIC EXAMS   12-02-14: mri of brain: 1. Acute, nonhemorrhagic perforator infarct affecting the left upper putamen and corona radiata. 2. 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 A lacunar infarct in the left thalamus is chronic but has occurred since most recent imaging 01/13/2014.  12-03-14: mra of head: Mild to moderate stenosis proximal right M1 segment. Otherwise no significant intracranial stenosis.   12-04-14: 2-d echo:  - Left ventricle: The cavity size was normal. There was moderate  concentric hypertrophy. Systolic function was normal. The estimated ejection fraction was in the range of 60% to 65%. - Left atrium: The atrium was mildly dilated. - Right ventricle: The cavity size was moderately dilated. Wall thickness was normal. - Impressions: Very limited study due to ppor sound wave transmission. LV appears normal. RV is dilated but function appears low normal.    LABS REVIEWED:   12-02-14: wbc 9.9; hgb 11.7; hct 36.2; mcv 87.4; plt 354; glucose 177; bun 27; creat 3.16; k+3.9; na++139 12-03-14: chol 243; ldl 169; trig 113; hdl 51; hgb a1c 7.1; tsh 3.182; vit b12: 421; RPR: nr; HIV: nr 12-06-14: wbc 7.3; hgb 11.1; hct 33.6; mcv 86.8; plt 341; glucose 126; bun 32; creat 3.46; k+3.9; na++138; mag  2.0  12-12-14: wbc 6.7 hgb 12.0; hct 36.9; mcv 87.0; plt 400; glucose 76; bun 30; creat 2.77; k+ 4.1; na++143; hgb a1c 7.0      Review of Systems Constitutional: Negative for malaise/fatigue.  Respiratory: Negative for cough and shortness of breath.   Cardiovascular: Negative for chest pain, palpitations and leg swelling.  Gastrointestinal: Negative for heartburn, abdominal pain and constipation.  Musculoskeletal: Negative for myalgias and joint pain.  Skin: Negative.   Neurological: Negative for headaches.      Physical Exam Constitutional: He is oriented to person, place, and time. No distress.  Obese   Neck: Neck supple. No JVD present. No thyromegaly present.  Cardiovascular: Normal rate, regular rhythm and intact distal pulses.   Respiratory: Effort normal and breath sounds normal. No respiratory distress.  GI: Soft. Bowel sounds are normal. He exhibits no distension.  Musculoskeletal: He exhibits no edema.  Is able to move all extremities Ambulates with cane Very slight left upper extremity weakness   Lymphadenopathy:    He has no cervical adenopathy.  Neurological: He is alert and oriented to person, place, and time.  Has hesitant speech pattern.   Skin: Skin is warm and dry. He is not diaphoretic.  Psychiatric: He has a normal mood and affect.      ASSESSMENT/ PLAN:  Will discharge to home with home health for st and rn to evaluate and treat as indicated for cognition and diabetes management. He will not need dme. His prescriptions have been written for a 30 day supply of his medications. He has a follow up with Dr. Candee Furbish on 01-05-15 at 2 pm.     Time spent with patient   40 minutes >50% time spent counseling; reviewing medical record; tests; labs; and developing future plan of care     Ok Edwards NP [facility]  Contact [phone] Monday through Friday 8am- 5pm  After hours call [phone]

## 2015-05-04 ENCOUNTER — Other Ambulatory Visit: Payer: Self-pay

## 2015-05-04 MED ORDER — CABERGOLINE 0.5 MG PO TABS: 0.2500 mg | ORAL_TABLET | ORAL | Status: DC

## 2015-05-29 ENCOUNTER — Inpatient Hospital Stay (HOSPITAL_COMMUNITY)
Admission: EM | Admit: 2015-05-29 | Discharge: 2015-06-04 | DRG: 291 | Disposition: A | Discharge status: Home / Self Care | Payer: 59 | Source: Ambulatory Visit | Attending: Internal Medicine | Admitting: Internal Medicine

## 2015-05-29 ENCOUNTER — Ambulatory Visit (INDEPENDENT_AMBULATORY_CARE_PROVIDER_SITE_OTHER): Payer: 59 | Admitting: Internal Medicine

## 2015-05-29 ENCOUNTER — Ambulatory Visit (INDEPENDENT_AMBULATORY_CARE_PROVIDER_SITE_OTHER): Payer: 59

## 2015-05-29 ENCOUNTER — Encounter (HOSPITAL_COMMUNITY): Payer: Self-pay | Admitting: Emergency Medicine

## 2015-05-29 VITALS — BP 154/100 | HR 72 | Temp 97.9°F | Resp 17 | Wt 380.0 lb

## 2015-05-29 DIAGNOSIS — E662 Morbid (severe) obesity with alveolar hypoventilation: Secondary | ICD-10-CM | POA: Diagnosis present

## 2015-05-29 DIAGNOSIS — D509 Iron deficiency anemia, unspecified: Secondary | ICD-10-CM | POA: Diagnosis present

## 2015-05-29 DIAGNOSIS — F1290 Cannabis use, unspecified, uncomplicated: Secondary | ICD-10-CM | POA: Diagnosis present

## 2015-05-29 DIAGNOSIS — N184 Chronic kidney disease, stage 4 (severe): Secondary | ICD-10-CM | POA: Diagnosis present

## 2015-05-29 DIAGNOSIS — I480 Paroxysmal atrial fibrillation: Secondary | ICD-10-CM | POA: Diagnosis present

## 2015-05-29 DIAGNOSIS — L97811 Non-pressure chronic ulcer of other part of right lower leg limited to breakdown of skin: Secondary | ICD-10-CM | POA: Diagnosis present

## 2015-05-29 DIAGNOSIS — G9389 Other specified disorders of brain: Secondary | ICD-10-CM | POA: Diagnosis present

## 2015-05-29 DIAGNOSIS — E1122 Type 2 diabetes mellitus with diabetic chronic kidney disease: Secondary | ICD-10-CM | POA: Diagnosis present

## 2015-05-29 DIAGNOSIS — E785 Hyperlipidemia, unspecified: Secondary | ICD-10-CM | POA: Diagnosis not present

## 2015-05-29 DIAGNOSIS — E1149 Type 2 diabetes mellitus with other diabetic neurological complication: Secondary | ICD-10-CM

## 2015-05-29 DIAGNOSIS — Z7982 Long term (current) use of aspirin: Secondary | ICD-10-CM | POA: Diagnosis not present

## 2015-05-29 DIAGNOSIS — I5023 Acute on chronic systolic (congestive) heart failure: Secondary | ICD-10-CM | POA: Insufficient documentation

## 2015-05-29 DIAGNOSIS — R0602 Shortness of breath: Secondary | ICD-10-CM

## 2015-05-29 DIAGNOSIS — Z87891 Personal history of nicotine dependence: Secondary | ICD-10-CM | POA: Diagnosis not present

## 2015-05-29 DIAGNOSIS — N179 Acute kidney failure, unspecified: Secondary | ICD-10-CM | POA: Diagnosis not present

## 2015-05-29 DIAGNOSIS — G4733 Obstructive sleep apnea (adult) (pediatric): Secondary | ICD-10-CM | POA: Diagnosis not present

## 2015-05-29 DIAGNOSIS — J441 Chronic obstructive pulmonary disease with (acute) exacerbation: Secondary | ICD-10-CM | POA: Diagnosis present

## 2015-05-29 DIAGNOSIS — E119 Type 2 diabetes mellitus without complications: Secondary | ICD-10-CM | POA: Diagnosis present

## 2015-05-29 DIAGNOSIS — Z8673 Personal history of transient ischemic attack (TIA), and cerebral infarction without residual deficits: Secondary | ICD-10-CM | POA: Diagnosis not present

## 2015-05-29 DIAGNOSIS — I10 Essential (primary) hypertension: Secondary | ICD-10-CM | POA: Diagnosis not present

## 2015-05-29 DIAGNOSIS — I4892 Unspecified atrial flutter: Secondary | ICD-10-CM | POA: Diagnosis present

## 2015-05-29 DIAGNOSIS — J9601 Acute respiratory failure with hypoxia: Secondary | ICD-10-CM | POA: Diagnosis present

## 2015-05-29 DIAGNOSIS — E1165 Type 2 diabetes mellitus with hyperglycemia: Secondary | ICD-10-CM | POA: Diagnosis present

## 2015-05-29 DIAGNOSIS — I130 Hypertensive heart and chronic kidney disease with heart failure and stage 1 through stage 4 chronic kidney disease, or unspecified chronic kidney disease: Secondary | ICD-10-CM | POA: Diagnosis present

## 2015-05-29 DIAGNOSIS — E876 Hypokalemia: Secondary | ICD-10-CM | POA: Diagnosis present

## 2015-05-29 DIAGNOSIS — I509 Heart failure, unspecified: Secondary | ICD-10-CM

## 2015-05-29 DIAGNOSIS — IMO0002 Reserved for concepts with insufficient information to code with codable children: Secondary | ICD-10-CM | POA: Diagnosis present

## 2015-05-29 DIAGNOSIS — F40240 Claustrophobia: Secondary | ICD-10-CM | POA: Diagnosis present

## 2015-05-29 DIAGNOSIS — R220 Localized swelling, mass and lump, head: Secondary | ICD-10-CM

## 2015-05-29 DIAGNOSIS — Z794 Long term (current) use of insulin: Secondary | ICD-10-CM

## 2015-05-29 DIAGNOSIS — R05 Cough: Secondary | ICD-10-CM

## 2015-05-29 DIAGNOSIS — Z6841 Body Mass Index (BMI) 40.0 and over, adult: Secondary | ICD-10-CM

## 2015-05-29 DIAGNOSIS — I2781 Cor pulmonale (chronic): Secondary | ICD-10-CM | POA: Diagnosis present

## 2015-05-29 DIAGNOSIS — I5033 Acute on chronic diastolic (congestive) heart failure: Secondary | ICD-10-CM | POA: Diagnosis present

## 2015-05-29 DIAGNOSIS — I5020 Unspecified systolic (congestive) heart failure: Secondary | ICD-10-CM

## 2015-05-29 DIAGNOSIS — R062 Wheezing: Secondary | ICD-10-CM

## 2015-05-29 DIAGNOSIS — R059 Cough, unspecified: Secondary | ICD-10-CM

## 2015-05-29 DIAGNOSIS — D352 Benign neoplasm of pituitary gland: Secondary | ICD-10-CM | POA: Diagnosis present

## 2015-05-29 HISTORY — DX: Unspecified systolic (congestive) heart failure: I50.20

## 2015-05-29 LAB — CBC WITH DIFFERENTIAL/PLATELET
BASOS ABS: 0.1 10*3/uL (ref 0.0–0.1)
BASOS PCT: 1 % (ref 0–1)
Eosinophils Absolute: 0.1 10*3/uL (ref 0.0–0.7)
Eosinophils Relative: 2 % (ref 0–5)
HEMATOCRIT: 30 % — AB (ref 39.0–52.0)
HEMOGLOBIN: 9.7 g/dL — AB (ref 13.0–17.0)
LYMPHS PCT: 17 % (ref 12–46)
Lymphs Abs: 1.2 10*3/uL (ref 0.7–4.0)
MCH: 28 pg (ref 26.0–34.0)
MCHC: 32.3 g/dL (ref 30.0–36.0)
MCV: 86.5 fL (ref 78.0–100.0)
MPV: 9.1 fL (ref 8.6–12.4)
Monocytes Absolute: 0.6 10*3/uL (ref 0.1–1.0)
Monocytes Relative: 8 % (ref 3–12)
NEUTROS ABS: 5.3 10*3/uL (ref 1.7–7.7)
NEUTROS PCT: 72 % (ref 43–77)
Platelets: 364 10*3/uL (ref 150–400)
RBC: 3.47 MIL/uL — AB (ref 4.22–5.81)
RDW: 17.8 % — ABNORMAL HIGH (ref 11.5–15.5)
WBC: 7.3 10*3/uL (ref 4.0–10.5)

## 2015-05-29 LAB — RAPID URINE DRUG SCREEN, HOSP PERFORMED
AMPHETAMINES: NOT DETECTED
BARBITURATES: NOT DETECTED
BENZODIAZEPINES: NOT DETECTED
Cocaine: NOT DETECTED
Opiates: NOT DETECTED
Tetrahydrocannabinol: NOT DETECTED

## 2015-05-29 LAB — CBC
HEMATOCRIT: 27.7 % — AB (ref 39.0–52.0)
HEMOGLOBIN: 8.9 g/dL — AB (ref 13.0–17.0)
MCH: 28.2 pg (ref 26.0–34.0)
MCHC: 32.1 g/dL (ref 30.0–36.0)
MCV: 87.7 fL (ref 78.0–100.0)
PLATELETS: 339 10*3/uL (ref 150–400)
RBC: 3.16 MIL/uL — AB (ref 4.22–5.81)
RDW: 17.9 % — ABNORMAL HIGH (ref 11.5–15.5)
WBC: 6.6 10*3/uL (ref 4.0–10.5)

## 2015-05-29 LAB — URINE MICROSCOPIC-ADD ON

## 2015-05-29 LAB — BASIC METABOLIC PANEL
ANION GAP: 8 (ref 5–15)
BUN: 27 mg/dL — ABNORMAL HIGH (ref 6–20)
CALCIUM: 7.9 mg/dL — AB (ref 8.9–10.3)
CO2: 23 mmol/L (ref 22–32)
Chloride: 109 mmol/L (ref 101–111)
Creatinine, Ser: 3.68 mg/dL — ABNORMAL HIGH (ref 0.61–1.24)
GFR, EST AFRICAN AMERICAN: 19 mL/min — AB (ref >60)
GFR, EST NON AFRICAN AMERICAN: 16 mL/min — AB (ref >60)
GLUCOSE: 107 mg/dL — AB (ref 65–99)
POTASSIUM: 3.1 mmol/L — AB (ref 3.5–5.1)
Sodium: 140 mmol/L (ref 135–145)

## 2015-05-29 LAB — URINALYSIS, ROUTINE W REFLEX MICROSCOPIC
Bilirubin Urine: NEGATIVE
GLUCOSE, UA: NEGATIVE mg/dL
Ketones, ur: NEGATIVE mg/dL
Leukocytes, UA: NEGATIVE
Nitrite: NEGATIVE
PH: 6 (ref 5.0–8.0)
Protein, ur: >300 mg/dL — AB
SPECIFIC GRAVITY, URINE: 1.017 (ref 1.005–1.030)

## 2015-05-29 LAB — PROTIME-INR
INR: 1.17 (ref 0.00–1.49)
Prothrombin Time: 15.1 seconds (ref 11.6–15.2)

## 2015-05-29 LAB — CREATININE, URINE, RANDOM: Creatinine, Urine: 103.13 mg/dL

## 2015-05-29 LAB — I-STAT TROPONIN, ED: Troponin i, poc: 0.06 ng/mL (ref 0.00–0.08)

## 2015-05-29 LAB — BRAIN NATRIURETIC PEPTIDE: B Natriuretic Peptide: 536.5 pg/mL — ABNORMAL HIGH (ref 0.0–100.0)

## 2015-05-29 LAB — POCT GLYCOSYLATED HEMOGLOBIN (HGB A1C): HEMOGLOBIN A1C: 6.1

## 2015-05-29 LAB — TROPONIN I: Troponin I: 0.06 ng/mL — ABNORMAL HIGH (ref <0.031)

## 2015-05-29 LAB — APTT: aPTT: 34 seconds (ref 24–37)

## 2015-05-29 LAB — GLUCOSE, POCT (MANUAL RESULT ENTRY): POC GLUCOSE: 128 mg/dL — AB (ref 70–99)

## 2015-05-29 MED ORDER — IRBESARTAN 300 MG PO TABS
300.0000 mg | ORAL_TABLET | Freq: Every day | ORAL | Status: DC
  Filled 2015-05-29: qty 1

## 2015-05-29 MED ORDER — SPIRONOLACTONE 25 MG PO TABS
25.0000 mg | ORAL_TABLET | Freq: Every day | ORAL | Status: DC
  Administered 2015-05-30: 25 mg via ORAL
  Filled 2015-05-29: qty 1

## 2015-05-29 MED ORDER — ASPIRIN 325 MG PO TABS
325.0000 mg | ORAL_TABLET | Freq: Every day | ORAL | Status: DC
  Administered 2015-05-30 – 2015-06-04 (×6): 325 mg via ORAL
  Filled 2015-05-29 (×7): qty 1

## 2015-05-29 MED ORDER — IPRATROPIUM BROMIDE 0.02 % IN SOLN
0.5000 mg | Freq: Once | RESPIRATORY_TRACT | Status: AC
  Administered 2015-05-29: 0.5 mg via RESPIRATORY_TRACT

## 2015-05-29 MED ORDER — FUROSEMIDE 10 MG/ML IJ SOLN
80.0000 mg | Freq: Every day | INTRAMUSCULAR | Status: DC
  Administered 2015-05-30: 80 mg via INTRAVENOUS
  Filled 2015-05-29 (×2): qty 8

## 2015-05-29 MED ORDER — HYDRALAZINE HCL 20 MG/ML IJ SOLN
5.0000 mg | INTRAMUSCULAR | Status: DC | PRN
  Administered 2015-05-31: 5 mg via INTRAVENOUS
  Filled 2015-05-29: qty 1

## 2015-05-29 MED ORDER — SODIUM CHLORIDE 0.9 % IV SOLN: 250.0000 mL | INTRAVENOUS | Status: DC | PRN

## 2015-05-29 MED ORDER — ISOSORB DINITRATE-HYDRALAZINE 20-37.5 MG PO TABS
2.0000 | ORAL_TABLET | Freq: Three times a day (TID) | ORAL | Status: DC
  Administered 2015-05-29 – 2015-06-04 (×18): 2 via ORAL
  Filled 2015-05-29 (×19): qty 2

## 2015-05-29 MED ORDER — SODIUM CHLORIDE 0.9 % IJ SOLN: 3.0000 mL | INTRAMUSCULAR | Status: DC | PRN

## 2015-05-29 MED ORDER — CABERGOLINE 0.5 MG PO TABS: 0.2500 mg | ORAL_TABLET | ORAL | Status: DC

## 2015-05-29 MED ORDER — LABETALOL HCL 200 MG PO TABS
200.0000 mg | ORAL_TABLET | Freq: Three times a day (TID) | ORAL | Status: DC
Start: 2015-05-29 — End: 2015-06-04
  Administered 2015-05-29 – 2015-06-04 (×18): 200 mg via ORAL
  Filled 2015-05-29 (×20): qty 1

## 2015-05-29 MED ORDER — ALBUTEROL SULFATE (2.5 MG/3ML) 0.083% IN NEBU
2.5000 mg | INHALATION_SOLUTION | Freq: Once | RESPIRATORY_TRACT | Status: AC
  Administered 2015-05-29: 2.5 mg via RESPIRATORY_TRACT

## 2015-05-29 MED ORDER — ACETAMINOPHEN 325 MG PO TABS
650.0000 mg | ORAL_TABLET | ORAL | Status: DC | PRN
  Administered 2015-06-04: 650 mg via ORAL
  Filled 2015-05-29: qty 2

## 2015-05-29 MED ORDER — SODIUM CHLORIDE 0.9 % IJ SOLN
3.0000 mL | Freq: Two times a day (BID) | INTRAMUSCULAR | Status: DC
  Administered 2015-05-29 – 2015-06-04 (×12): 3 mL via INTRAVENOUS

## 2015-05-29 MED ORDER — ATORVASTATIN CALCIUM 80 MG PO TABS
80.0000 mg | ORAL_TABLET | Freq: Every day | ORAL | Status: DC
  Administered 2015-05-30 – 2015-06-04 (×6): 80 mg via ORAL
  Filled 2015-05-29 (×6): qty 1

## 2015-05-29 MED ORDER — ONDANSETRON HCL 4 MG/2ML IJ SOLN
4.0000 mg | Freq: Four times a day (QID) | INTRAMUSCULAR | Status: DC | PRN
  Administered 2015-06-02: 4 mg via INTRAVENOUS
  Filled 2015-05-29: qty 2

## 2015-05-29 MED ORDER — FUROSEMIDE 10 MG/ML IJ SOLN
40.0000 mg | Freq: Once | INTRAMUSCULAR | Status: AC
  Administered 2015-05-29: 40 mg via INTRAVENOUS
  Filled 2015-05-29: qty 4

## 2015-05-29 MED ORDER — HEPARIN SODIUM (PORCINE) 5000 UNIT/ML IJ SOLN
5000.0000 [IU] | Freq: Three times a day (TID) | INTRAMUSCULAR | Status: DC
  Administered 2015-05-29 – 2015-06-04 (×18): 5000 [IU] via SUBCUTANEOUS
  Filled 2015-05-29 (×18): qty 1

## 2015-05-29 MED ORDER — INSULIN ASPART 100 UNIT/ML ~~LOC~~ SOLN
0.0000 [IU] | Freq: Three times a day (TID) | SUBCUTANEOUS | Status: DC
  Administered 2015-05-30: 1 [IU] via SUBCUTANEOUS
  Administered 2015-05-31: 2 [IU] via SUBCUTANEOUS
  Administered 2015-05-31 – 2015-06-04 (×2): 1 [IU] via SUBCUTANEOUS

## 2015-05-29 MED ORDER — AMLODIPINE BESYLATE 10 MG PO TABS
10.0000 mg | ORAL_TABLET | Freq: Every day | ORAL | Status: DC
  Administered 2015-05-29 – 2015-06-04 (×7): 10 mg via ORAL
  Filled 2015-05-29 (×8): qty 1

## 2015-05-29 MED ORDER — POTASSIUM CHLORIDE 20 MEQ/15ML (10%) PO SOLN
40.0000 meq | Freq: Once | ORAL | Status: AC
  Administered 2015-05-29: 40 meq via ORAL
  Filled 2015-05-29: qty 30

## 2015-05-29 MED ORDER — INSULIN GLARGINE 100 UNIT/ML ~~LOC~~ SOLN
25.0000 [IU] | Freq: Every day | SUBCUTANEOUS | Status: DC
  Administered 2015-05-30 – 2015-06-04 (×6): 25 [IU] via SUBCUTANEOUS
  Filled 2015-05-29 (×6): qty 0.25

## 2015-05-29 NOTE — ED Notes (Signed)
EKG completed and given to EDP.  

## 2015-05-29 NOTE — ED Notes (Signed)
Attempted report to [facility]

## 2015-05-29 NOTE — ED Notes (Signed)
Onset one week ago shortness of breath worsening overtime especially with exertion. Weight gain 27 pounds from August until now.

## 2015-05-29 NOTE — H&P (Signed)
[facility] History and Physical  [employer] Sr. UJ:[id number] DOB: [birth year] DOA: 05/29/2015  Referring physician: ED physician PCP: [name], MD  Specialists:   Chief Complaint: Shortness of breath and worsening leg edema  HPI: Blake Ina Sr. is a 64 y.o. male with PMH of hypertension, hyperlipidemia, diabetes mellitus, OSA on CPAP, prolactin secreting pituitary edema, stroke, systolic congestive heart failure (EF 40-45%), seizure, depression, CKD-IV, who presents with shortness of breath and worsening leg edema.  Patient reports that he has been having shortness of breath in the past several days, which has been progressively getting worse. He has mild dry cough, but no chest pain. Patient states that his leg edema has worsened too. He has gained 27 Lbs since August. He has orthopnea. has Patient does not have fever, chills. He denies abdominal pain, diarrhea, symptoms of UTI or new unilateral weakness. He states that he is taking water pills at home, but cannot tell the dose of Lasix and spironolactone.  In ED, patient was found to have elevated BNP 536.5, negative troponin, WBC 6.6, temperature normal, mildly bradycardia, but has inserted a 0.1, worsening renal function. Chest x-ray showed mild pulmonary edema and a trace amount of bilateral pleural effusion, but no infiltration.   Where does patient live?   At home   Can patient participate in ADLs? Some   Review of Systems:   General: no fevers, chills. Has gained 27 pounds since 01/2015,  has poor appetite, has fatigue HEENT: no blurry vision, hearing changes or sore throat Pulm: has dyspnea, coughing, no wheezing CV: no chest pain, palpitations Abd: no nausea, vomiting, abdominal pain, diarrhea, constipation GU: no dysuria, burning on urination, increased urinary frequency, hematuria  Ext: has leg edema Neuro: no unilateral weakness, numbness, or tingling, no vision change or hearing loss Skin: no rash MSK:  No muscle spasm, no deformity, no limitation of range of movement in spin Heme: No easy bruising.  Travel history: No recent long distant travel.  Allergy: No Known Allergies  Past Medical History  Diagnosis Date  . Hypertension   . Multiple wounds     on lower legs - sees [name] Pain at the [facility]  . Hypercholesterolemia   . OSA on CPAP     uses cpap, setting of 5  . Prolactin secreting pituitary adenoma ([place])     [name] 12/02/2014  . Type II diabetes mellitus ([place])     uncontrolled/notes 12/02/2014  . Chronic kidney disease (CKD), stage III (moderate)     sees dr Florene Glen nephrology every 6-9 months  . CVA (cerebral vascular accident) [facility]) 12/2013    admitted in July 2015 for acute right-sided infarct/notes 12/02/2014  . Stroke ([place]) 12/02/2014    expressive aphasia  . Systolic CHF [facility])     Past Surgical History  Procedure Laterality Date  . Colonoscopy  6-7 yrs ago  . Colonoscopy N/A 04/05/2013    Procedure: COLONOSCOPY;  Surgeon: Juanita Craver, MD;  Location: [facility];  Service: Endoscopy;  Laterality: N/A;    Social History:  reports that he quit smoking about 44 years ago. His smoking use included Cigarettes. He has a .5 pack-year smoking history. He has never used smokeless tobacco. He reports that he uses illicit drugs (Marijuana). He reports that he does not drink alcohol.  Family History:  Family History  Problem Relation Age of Onset  . Diabetes Mother   . Hypertension Mother   . Diabetes Father   . Hypertension Father   . Diabetes Sister   .  Diabetes Brother   . Asthma Son      Prior to Admission medications   Medication Sig Start Date End Date Taking? Authorizing Provider  Albiglutide (TANZEUM) 30 MG PEN Inject 30 mg into the skin once a week. 01/02/15  Yes Shawnee Knapp, MD  amLODipine (NORVASC) 10 MG tablet Take 1 tablet (10 mg total) by mouth daily. 12/27/14  Yes Shawnee Knapp, MD  aspirin 325 MG tablet Take 325 mg by mouth daily.   Yes Historical  Provider, MD  atorvastatin (LIPITOR) 80 MG tablet Take 1 tablet (80 mg total) by mouth daily at 6 PM. 12/27/14  Yes Shawnee Knapp, MD  Azilsartan Medoxomil (EDARBI) 40 MG TABS Take 1 tablet by mouth daily. 02/06/15  Yes Roselee Culver, MD  cabergoline (DOSTINEX) 0.5 MG tablet Take 0.5 tablets (0.25 mg total) by mouth 2 (two) times a week. 05/04/15  Yes Shawnee Knapp, MD  furosemide (LASIX) 80 MG tablet Take 1 tablet (80 mg total) by mouth daily. 02/06/15  Yes Roselee Culver, MD  insulin glargine (LANTUS) 100 UNIT/ML injection Inject 0.3 mLs (30 Units total) into the skin daily. 12/27/14  Yes Shawnee Knapp, MD  isosorbide-hydrALAZINE (BIDIL) 20-37.5 MG per tablet Take 2 tablets by mouth 3 (three) times daily. 12/27/14  Yes Shawnee Knapp, MD  labetalol (NORMODYNE) 200 MG tablet TAKE 1 TABLET 3 TIMES A DAY. 03/24/15  Yes Posey Boyer, MD  spironolactone (ALDACTONE) 25 MG tablet Take 25 mg by mouth daily. 11/18/14  Yes Historical Provider, MD  B-D ULTRAFINE III SHORT PEN 31G X 8 MM MISC USE WITH SOLOSTAR AND NOVOLOG 01/05/15   Shawnee Knapp, MD  clopidogrel (PLAVIX) 75 MG tablet Take 1 tablet (75 mg total) by mouth daily with breakfast. Patient not taking: Reported on 05/29/2015 02/06/15   Roselee Culver, MD  escitalopram (LEXAPRO) 20 MG tablet Take 1 tablet (20 mg total) by mouth daily. Patient not taking: Reported on 05/29/2015 12/27/14   Shawnee Knapp, MD  Syringe, Disposable, 1 ML MISC Insulin syringes as needed to go with medications - lantus and tanzeum 12/27/14   Shawnee Knapp, MD    Physical Exam: Filed Vitals:   05/29/15 1940 05/29/15 2000 05/29/15 2030 05/29/15 2100  BP:  163/90 145/79 154/89  Pulse: 56 53 47 55  Temp:      TempSrc:      Resp: 17 17 18 19   SpO2: 100% 100% 100% 100%   General: Mildly distressed HEENT:       Eyes: PERRL, EOMI, no scleral icterus.       ENT: No discharge from the ears and nose, no pharynx injection, no tonsillar enlargement.        Neck: Difficult to assess JVD due to  morbid obesity, no bruit, no mass felt. Heme: No neck lymph node enlargement. Cardiac: S1/S2, RRR, No murmurs, No gallops or rubs. Pulm: has fine crackles bilaterally, No wheezing, rhonchi or rubs. Abd: Soft, nondistended, nontender, no rebound pain, no organomegaly, BS present. Ext: 2+ pitting leg edema bilaterally. 2+DP/PT pulse bilaterally. Musculoskeletal: No joint deformities, No joint redness or warmth, no limitation of ROM in spin. Skin: No rashes.  Neuro: Alert, oriented X3, cranial nerves II-XII grossly intact, muscle strength 5/5 in all extremities, sensation to light touch intact.  Psych: Patient is not psychotic, no suicidal or hemocidal ideation.  Labs on Admission:  Basic Metabolic Panel:  Recent Labs Lab 05/29/15 1840  NA 140  K  3.1*  CL 109  CO2 23  GLUCOSE 107*  BUN 27*  CREATININE 3.68*  CALCIUM 7.9*   Liver Function Tests: No results for input(s): AST, ALT, ALKPHOS, BILITOT, PROT, ALBUMIN in the last 168 hours. No results for input(s): LIPASE, AMYLASE in the last 168 hours. No results for input(s): AMMONIA in the last 168 hours. CBC:  Recent Labs Lab 05/29/15 1639 05/29/15 1840  WBC 7.3 6.6  NEUTROABS 5.3  --   HGB 9.7* 8.9*  HCT 30.0* 27.7*  MCV 86.5 87.7  PLT 364 339   Cardiac Enzymes: No results for input(s): CKTOTAL, CKMB, CKMBINDEX, TROPONINI in the last 168 hours.  BNP (last 3 results)  Recent Labs  05/29/15 1840  BNP 536.5*    ProBNP (last 3 results) No results for input(s): PROBNP in the last 8760 hours.  CBG: No results for input(s): GLUCAP in the last 168 hours.  Radiological Exams on Admission: Dg Chest 2 View  05/29/2015  CLINICAL DATA:  64 year old male with cough, wheezing and lower extremity edema EXAM: CHEST  2 VIEW COMPARISON:  Prior chest x-ray 01/13/2014 FINDINGS: Stable cardiomegaly with left heart enlargement. Mild pulmonary vascular congestion without overt edema. No focal airspace consolidation. Perhaps trace  bilateral effusions. No acute osseous abnormality. IMPRESSION: Cardiomegaly with mild pulmonary vascular congestion but no overt pulmonary edema. Trace bilateral pleural effusions. Electronically Signed   By: Jacqulynn Cadet M.D.   On: 05/29/2015 17:57    EKG: Independently reviewed. QTC 472. Machine read as "new flutter", but looks artificial effect to me. No flutter on tele monitoring.   Assessment/Plan Principal Problem:   Acute on chronic systolic (congestive) heart failure (HCC) Active Problems:   OSA (obstructive sleep apnea)   Sellar or suprasellar mass   Acute renal failure superimposed on stage 4 chronic kidney disease (HCC)   Morbid obesity (HCC)   CVA (cerebral infarction)   Stroke (HCC)   Essential hypertension, benign   HLD (hyperlipidemia)   Type II diabetes mellitus with neurological manifestations (HCC)   CHF exacerbation (HCC)   Acute on chronic systolic (congestive) heart failure ([place]): Patient's shortness of breath, elevated BNP, worsening leg edema and orthopnea, are consistent with CHF exacerbation. No chest pain, unlikely to have pulmonary embolism. No infiltration on chest x-ray. Pt was started BiPAP in ED with some improvement. 2-D Echo on 01/14/14 showed a year for 40-45%. The repeated 2-D echo on 12/04/14 was a very limited study due to poor sound wave tgransmission.  -will admit to SDU -Lasix 40 mg was given in ED, will give another dose of 40 mg lasix x 1, then 80 mg daily by IV from tomorrow morning -trop x 3 -2d echo -Risk factor stratification: FLP, TSH -will continue home labetalol, ASA -Irbesartan (see below under AoCKD-IV) -continue BiDil -Daily weights -strict I/O's -Low salt diet -UDS  OSA: on CPAP at home -Currently on BiPAP prn  Prolactin secreting pituitary adenoma: last prolactin level was >1000 on 06/29/13. -will continue home Cabergoline  -check prolactin level  AoCKD-IV Baseline Cre is 3.0 to 3.5, his Cre is 3.68, slightly up from  baseline. Likely due to cardiorenal syndrome. Pt is on ARB, Edarbi (Azilsartan) at home. I will continue ARB now, but switch to irbesartan in hospital. I think cardiorenal syndrome is most likely the etiology for worsening renal function. Will follow up renal Fx closely. If renal fx gets worse, will need to d/c irbesartan. - Check FeUrea - Follow up renal function by BMP - treat CHF exacerbation  as above  CVA (cerebral infarction): stable. No acute new issues. He has both ASA and plavix on his med list, but he is not taking plavix currently at home. -ASA and lipitor  Essential hypertension, benign; -Irbesartan, amlodipine, labetalol -On IV Lasix -continue BiDil  HLD: Last LDL was 169 on 11/23/14 -Continue home medications: Lipitor -Check FLP  DM-II: Last A1c 6.1, well controled. Patient is taking Lantus and Tanzeum at home -will decrease Lantus dose from 30 to 25 units daily   -SSI  Hypokalemia: K= 3.1 on admission. - Repleted - Check Mg level  Questionable A flutter:  Machine read as "new flutter", but looks like artificial effect to me. No flutter on tele monitoring. -will repeat EKG in AM   DVT ppx: SQ Heparin  Code Status: Full code Family Communication: None at bed side. Disposition Plan: Admit to inpatient   Date of Service 05/29/2015    Ivor Costa [facility] Pager [phone]  If 7PM-7AM, please contact night-coverage [email or web address] Password [facility] 05/29/2015, 9:27 PM

## 2015-05-29 NOTE — Progress Notes (Signed)
Patient ID: Blake Childree Sr., male    DOB: 01-Sep-1950, 64 y.o.   MRN: [id number]  PCP: [name], MD  Subjective:   Chief Complaint  Patient presents with  . Shortness of Breath    x2 days   . Wheezing    HPI Presents for evaluation of shortness of breath. He was brought back to the clinical area and I was asked to see him urgently. He is accompanied by his son.  Has been congested, with a cold, for the past several days. Today his wife became concerned that he was wheezing and asked their oldest son to come by. The son noticed that he appeared short of breath and brought him in. The son also noted that his legs look more swollen than usual.  Has been needing to prop himself up on more pillows than normal. No chest pain. No headache.  He is coughing, and his son recognizes the cough from when he would have asthma exacerbations himself as a child.    Review of Systems  Constitutional: Positive for fatigue.  HENT: Positive for congestion.   Eyes: Negative for redness and visual disturbance.  Respiratory: Positive for cough, shortness of breath and wheezing. Negative for apnea, choking, chest tightness and stridor.   Cardiovascular: Positive for leg swelling. Negative for chest pain and palpitations.  Gastrointestinal: Negative for nausea, vomiting, diarrhea and constipation.  Endocrine: Negative for polydipsia, polyphagia and polyuria.  Genitourinary: Negative for dysuria, urgency, frequency and hematuria.  Musculoskeletal: Negative for neck pain.  Skin: Negative for rash.  Neurological: Positive for dizziness (mildly, when getting out of the vechicle to come here). Negative for headaches.  Hematological: Negative for adenopathy.       Patient Active Problem List   Diagnosis Date Noted  . Uncontrolled diabetes with stage 4 chronic kidney disease GFR 15-29 ([place]) 12/28/2014  . Type II diabetes mellitus with neurological manifestations ([place]) 12/12/2014  . Acute  ischemic stroke ([place]) 12/02/2014  . Acute renal failure superimposed on stage 3 chronic kidney disease ([place]) 12/02/2014  . Acute ischemic left MCA stroke ([place]) 12/02/2014  . Stroke ([place]) 03/24/2014  . Type 2 diabetes mellitus with complication ([place]) [id number]  . Essential hypertension, benign 03/24/2014  . HLD (hyperlipidemia) 03/24/2014  . CVA (cerebral infarction) 01/13/2014  . AKI (acute kidney injury) ([place]) 07/03/2013  . Hypernatremia 07/03/2013  . Morbid obesity ([place]) 07/03/2013  . Venous stasis ulcers ([place]) 07/03/2013  . DM (diabetes mellitus) type 2, uncontrolled, with ketoacidosis ([place]) 07/03/2013  . 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 Sellar or suprasellar mass 06/22/2013  . CKD (chronic kidney disease) stage 4, GFR 15-29 ml/min ([facility]) 06/22/2013  . Acute respiratory failure ([place]) 06/22/2013  . Seizure ([place]) 06/21/2013  . Altered mental status 06/21/2013  . DKA (diabetic ketoacidoses) ([place]) 06/21/2013  . Hypercarbia 06/21/2013  . OSA (obstructive sleep apnea) 05/25/2013     Prior to Admission medications   Medication Sig Start Date End Date Taking? Authorizing Provider  Albiglutide (TANZEUM) 30 MG PEN Inject 30 mg into the skin once a week. 01/02/15  Yes Shawnee Knapp, MD  amLODipine (NORVASC) 10 MG tablet Take 1 tablet (10 mg total) by mouth daily. 12/27/14  Yes Shawnee Knapp, MD  aspirin 325 MG tablet Take 325 mg by mouth daily.   Yes Historical Provider, MD  atorvastatin (LIPITOR) 80 MG tablet Take 1 tablet (80 mg total) by mouth daily at 6 PM. 12/27/14  Yes Shawnee Knapp, MD  Azilsartan Medoxomil (EDARBI) 40 MG  TABS Take 1 tablet by mouth daily. 02/06/15  Yes Roselee Culver, MD  B-D ULTRAFINE III SHORT PEN 31G X 8 MM MISC USE WITH SOLOSTAR AND NOVOLOG 01/05/15  Yes Shawnee Knapp, MD  cabergoline (DOSTINEX) 0.5 MG tablet Take 0.5 tablets (0.25 mg total) by mouth 2 (two) times a week. 05/04/15  Yes Shawnee Knapp, MD  furosemide (LASIX) 80 MG tablet Take 1 tablet (80 mg total) by mouth  daily. 02/06/15  Yes Roselee Culver, MD  insulin glargine (LANTUS) 100 UNIT/ML injection Inject 0.3 mLs (30 Units total) into the skin daily. 12/27/14  Yes Shawnee Knapp, MD  isosorbide-hydrALAZINE (BIDIL) 20-37.5 MG per tablet Take 2 tablets by mouth 3 (three) times daily. 12/27/14  Yes Shawnee Knapp, MD  labetalol (NORMODYNE) 200 MG tablet TAKE 1 TABLET 3 TIMES A DAY. 03/24/15  Yes Posey Boyer, MD  spironolactone (ALDACTONE) 25 MG tablet Take 25 mg by mouth daily. 11/18/14  Yes Historical Provider, MD  Syringe, Disposable, 1 ML MISC Insulin syringes as needed to go with medications - lantus and tanzeum 12/27/14  Yes Shawnee Knapp, MD  clopidogrel (PLAVIX) 75 MG tablet Take 1 tablet (75 mg total) by mouth daily with breakfast. Patient not taking: Reported on 05/29/2015 02/06/15   Roselee Culver, MD  escitalopram (LEXAPRO) 20 MG tablet Take 1 tablet (20 mg total) by mouth daily. Patient not taking: Reported on 05/29/2015 12/27/14   Shawnee Knapp, MD     No Known Allergies     Objective:  Physical Exam  Constitutional: He is oriented to person, place, and time. He appears well-developed and well-nourished. He is active. He appears distressed.  BP 154/100 mmHg  Pulse 72  Temp(Src) 97.9 F (36.6 C) (Oral)  Resp 17  SpO2 97% Weight is up 27 lbs since last visit here 01/2015.   HENT:  Head: Normocephalic and atraumatic.  Eyes: Conjunctivae and lids are normal. No scleral icterus.  Neck: Normal range of motion and phonation normal. Neck supple. No thyromegaly present.  Cardiovascular: Normal rate, regular rhythm and normal heart sounds.   Pulmonary/Chest: No accessory muscle usage. Tachypnea noted. He is in respiratory distress. He has wheezes (initially, resolved with nebulized albuterol + atrovent). He has rales.  Dyspneic on exertion (walking to the bathroom, to the scales)  Lymphadenopathy:    He has no cervical adenopathy.  Neurological: He is alert and oriented to person, place, and time.    Skin: Skin is warm and dry. No rash noted.  Psychiatric: He has a normal mood and affect. His speech is normal and behavior is normal.      Results for orders placed or performed in visit on 05/29/15  POCT glucose (manual entry)  Result Value Ref Range   POC Glucose 128 (A) 70 - 99 mg/dl  POCT glycosylated hemoglobin (Hb A1C)  Result Value Ref Range   Hemoglobin A1C 6.1    CXR: [facility] reading (PRIMARY) by  Dr. Laney Pastor. Cardiomegaly with florid interstitial markings bilaterally.       Assessment & Plan:   1. Shortness of breath 2. Wheezing 4. Acute congestive heart failure, unspecified congestive heart failure type ([place]) CXR reveals cardiomegaly and florid interstitial markings bibasilarly. This appears to be his first episode. No cardiology evaluation other than ECHO during hospitalization for evaluation of CVA summer 2016. Complicated by CKD. Transport to ED by EMS for additional evaluation. Charge nurse made aware. - albuterol (PROVENTIL) (2.5 MG/3ML) 0.083% nebulizer solution 2.5  mg; Take 3 mLs (2.5 mg total) by nebulization once. - ipratropium (ATROVENT) nebulizer solution 0.5 mg; Take 2.5 mLs (0.5 mg total) by nebulization once. - DG Chest 2 View; Future - CBC with Differential/Platelet   3. Type II diabetes mellitus with neurological manifestations ([place]) Well controlled. - POCT glucose (manual entry) - POCT glycosylated hemoglobin (Hb A1C)     Fara Chute, PA-C Physician Assistant-Certified [facility] I have participated in the care of this patient with the Advanced Practice Provider and agree with Diagnosis and Plan as documented. Exam post neb was more consistent with acute CHF. Robert P. Laney Pastor, M.D.

## 2015-05-29 NOTE — ED Provider Notes (Signed)
CSN: [id number]     Arrival date & time 05/29/15  1751 History   First MD Initiated Contact with Patient 05/29/15 1759     Chief Complaint  Patient presents with  . Shortness of Breath     (Consider location/radiation/quality/duration/timing/severity/associated sxs/prior Treatment) Patient is a 64 y.o. male presenting with shortness of breath. The history is provided by the patient.  Shortness of Breath Severity:  Severe Onset quality:  Gradual Duration:  1 week Timing:  Constant Progression:  Worsening Chronicity:  New Context: activity   Context: not URI   Relieved by:  Rest and sitting up Worsened by:  Exertion (Lying down) Ineffective treatments:  None tried Associated symptoms: cough and wheezing   Associated symptoms: no abdominal pain, no chest pain, no fever, no sore throat, no sputum production and no vomiting   Risk factors: obesity   Risk factors: no tobacco use   Risk factors comment:  Morbid obesity, obstructive sleep apnea, stroke, diabetes, chronic kidney disease   Past Medical History  Diagnosis Date  . Hypertension   . Multiple wounds     on lower legs - sees [name] Pain at the [facility]  . Hypercholesterolemia   . OSA on CPAP     uses cpap, setting of 5  . 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 Type II diabetes mellitus ([place])     uncontrolled/notes 12/02/2014  . Chronic kidney disease (CKD), stage III (moderate)     sees dr Florene Glen nephrology every 6-9 months  . CVA (cerebral vascular accident) [facility]) 12/2013    admitted in July 2015 for acute right-sided infarct/notes 12/02/2014  . Stroke ([place]) 12/02/2014    expressive aphasia   Past Surgical History  Procedure Laterality Date  . Colonoscopy  6-7 yrs ago  . Colonoscopy N/A 04/05/2013    Procedure: COLONOSCOPY;  Surgeon: Juanita Craver, MD;  Location: [facility] ENDOSCOPY;  Service: Endoscopy;  Laterality: N/A;   Family History  Problem Relation Age of Onset  . Diabetes Mother    . Hypertension Mother   . Diabetes Father   . Hypertension Father   . Diabetes Sister   . Diabetes Brother   . Asthma Son    Social History  Substance Use Topics  . Smoking status: Former Smoker -- 0.25 packs/day for 2 years    Types: Cigarettes    Quit date: 06/17/1970  . Smokeless tobacco: Never Used  . Alcohol Use: No    Review of Systems  Constitutional: Positive for unexpected weight change. Negative for fever.       27 pound weight gain since August  HENT: Negative for sore throat.   Respiratory: Positive for cough, shortness of breath and wheezing. Negative for sputum production.   Cardiovascular: Negative for chest pain.  Gastrointestinal: Negative for vomiting and abdominal pain.  All other systems reviewed and are negative.     Allergies  Review of patient's allergies indicates no known allergies.  Home Medications   Prior to Admission medications   Medication Sig Start Date End Date Taking? Authorizing Provider  Albiglutide (TANZEUM) 30 MG PEN Inject 30 mg into the skin once a week. 01/02/15   Shawnee Knapp, MD  amLODipine (NORVASC) 10 MG tablet Take 1 tablet (10 mg total) by mouth daily. 12/27/14   Shawnee Knapp, MD  aspirin 325 MG tablet Take 325 mg by mouth daily.    Historical Provider, MD  atorvastatin (LIPITOR) 80 MG tablet Take 1 tablet (  80 mg total) by mouth daily at 6 PM. 12/27/14   Shawnee Knapp, MD  Azilsartan Medoxomil (EDARBI) 40 MG TABS Take 1 tablet by mouth daily. 02/06/15   Roselee Culver, MD  B-D ULTRAFINE III SHORT PEN 31G X 8 MM MISC USE WITH SOLOSTAR AND NOVOLOG 01/05/15   Shawnee Knapp, MD  cabergoline (DOSTINEX) 0.5 MG tablet Take 0.5 tablets (0.25 mg total) by mouth 2 (two) times a week. 05/04/15   Shawnee Knapp, MD  clopidogrel (PLAVIX) 75 MG tablet Take 1 tablet (75 mg total) by mouth daily with breakfast. Patient not taking: Reported on 05/29/2015 02/06/15   Roselee Culver, MD  escitalopram (LEXAPRO) 20 MG tablet Take 1 tablet (20 mg total) by  mouth daily. Patient not taking: Reported on 05/29/2015 12/27/14   Shawnee Knapp, MD  furosemide (LASIX) 80 MG tablet Take 1 tablet (80 mg total) by mouth daily. 02/06/15   Roselee Culver, MD  insulin glargine (LANTUS) 100 UNIT/ML injection Inject 0.3 mLs (30 Units total) into the skin daily. 12/27/14   Shawnee Knapp, MD  isosorbide-hydrALAZINE (BIDIL) 20-37.5 MG per tablet Take 2 tablets by mouth 3 (three) times daily. 12/27/14   Shawnee Knapp, MD  labetalol (NORMODYNE) 200 MG tablet TAKE 1 TABLET 3 TIMES A DAY. 03/24/15   Posey Boyer, MD  spironolactone (ALDACTONE) 25 MG tablet Take 25 mg by mouth daily. 11/18/14   Historical Provider, MD  Syringe, Disposable, 1 ML MISC Insulin syringes as needed to go with medications - lantus and tanzeum 12/27/14   Shawnee Knapp, MD   Pulse 70  Temp(Src) 98.5 F (36.9 C) (Oral)  Resp 16  SpO2 98% Physical Exam  Constitutional: He is oriented to person, place, and time. He appears well-developed and well-nourished.  Morbidly obese  HENT:  Head: Normocephalic and atraumatic.  Mouth/Throat: Oropharynx is clear and moist.  Eyes: Conjunctivae and EOM are normal. Pupils are equal, round, and reactive to light.  Neck: Normal range of motion. Neck supple. JVD present.  Cardiovascular: Normal rate, regular rhythm and intact distal pulses.   No murmur heard. Pulmonary/Chest: Effort normal. Tachypnea noted. No respiratory distress. He has wheezes. He has no rales.  Able to speak in short 2-3 word sentences  Abdominal: Soft. He exhibits distension. There is no tenderness. There is no rebound and no guarding.  Musculoskeletal: Normal range of motion. He exhibits edema. He exhibits no tenderness.  3+ edema in bilateral lower extremities  Neurological: He is alert and oriented to person, place, and time.  Skin: Skin is warm and dry. No rash noted. No erythema.  Psychiatric: He has a normal mood and affect. His behavior is normal.  Nursing note and vitals reviewed.   ED  Course  Procedures (including critical care time) Labs Review Labs Reviewed  MRSA PCR SCREENING - Abnormal; Notable for the following:    MRSA by PCR POSITIVE (*)    All other components within normal limits  BASIC METABOLIC PANEL - Abnormal; Notable for the following:    Potassium 3.1 (*)    Glucose, Bld 107 (*)    BUN 27 (*)    Creatinine, Ser 3.68 (*)    Calcium 7.9 (*)    GFR calc non Af Amer 16 (*)    GFR calc Af Amer 19 (*)    All other components within normal limits  CBC - Abnormal; Notable for the following:    RBC 3.16 (*)  Hemoglobin 8.9 (*)    HCT 27.7 (*)    RDW 17.9 (*)    All other components within normal limits  BRAIN NATRIURETIC PEPTIDE - Abnormal; Notable for the following:    B Natriuretic Peptide 536.5 (*)    All other components within normal limits  URINALYSIS, ROUTINE W REFLEX MICROSCOPIC (NOT AT [facility]) - Abnormal; Notable for the following:    Hgb urine dipstick TRACE (*)    Protein, ur >300 (*)    All other components within normal limits  BASIC METABOLIC PANEL - Abnormal; Notable for the following:    Potassium 3.1 (*)    Glucose, Bld 101 (*)    BUN 27 (*)    Creatinine, Ser 3.86 (*)    Calcium 8.0 (*)    GFR calc non Af Amer 15 (*)    GFR calc Af Amer 18 (*)    All other components within normal limits  TROPONIN I - Abnormal; Notable for the following:    Troponin I 0.06 (*)    All other components within normal limits  TROPONIN I - Abnormal; Notable for the following:    Troponin I 0.05 (*)    All other components within normal limits  TROPONIN I - Abnormal; Notable for the following:    Troponin I 0.04 (*)    All other components within normal limits  URINE MICROSCOPIC-ADD ON - Abnormal; Notable for the following:    Squamous Epithelial / LPF 0-5 (*)    Bacteria, UA MANY (*)    Casts HYALINE CASTS (*)    All other components within normal limits  GLUCOSE, CAPILLARY - Abnormal; Notable for the following:    Glucose-Capillary 122 (*)     All other components within normal limits  MAGNESIUM  CREATININE, URINE, RANDOM  URINE RAPID DRUG SCREEN, HOSP PERFORMED  LIPID PANEL  APTT  PROTIME-INR  TSH  GLUCOSE, CAPILLARY  GLUCOSE, CAPILLARY  UREA NITROGEN, URINE  PROLACTIN  BASIC METABOLIC PANEL  MAGNESIUM  I-STAT TROPOININ, ED    Imaging Review Dg Chest 2 View  05/29/2015  CLINICAL DATA:  64 year old male with cough, wheezing and lower extremity edema EXAM: CHEST  2 VIEW COMPARISON:  Prior chest x-ray 01/13/2014 FINDINGS: Stable cardiomegaly with left heart enlargement. Mild pulmonary vascular congestion without overt edema. No focal airspace consolidation. Perhaps trace bilateral effusions. No acute osseous abnormality. IMPRESSION: Cardiomegaly with mild pulmonary vascular congestion but no overt pulmonary edema. Trace bilateral pleural effusions. Electronically Signed   By: Jacqulynn Cadet M.D.   On: 05/29/2015 17:57   I have personally reviewed and evaluated these images and lab results as part of my medical decision-making.   EKG Interpretation   Date/Time:  Monday May 29 2015 18:04:44 EST Ventricular Rate:  69 PR Interval:    QRS Duration: 106 QT Interval:  441 QTC Calculation: 472 R Axis:   -28 Text Interpretation:  new Atrial flutter Borderline left axis deviation  Confirmed by Maryan Rued  MD, Loree Fee ([id number]) on 05/29/2015 6:11:48 PM      MDM   Final diagnoses:  Cough    Patient is a 64 year old male with a history of type 2 diabetes, chronic kidney disease, hypertension and stroke who presents today with one week of worsening shortness of breath. Exertional dyspnea, orthopnea and a weight gain of approximately 27 pounds in the last 4 months. Patient was seen at urgent care and sent here for further care. He had an x-ray done that showed cardiomegaly with mild pulmonary vascular  congestion and trace bilateral pleural effusions. Patient is audibly wheezing and tachypnea on exam with evidence of  fluid overload.  He denies any URI symptoms or concern for infectious etiology as the cause of his wheezing.  Patient has not smoked in years and has no history of COPD or asthma. He does not use inhalers at home.  Patient was given albuterol in route without any improvement in his wheezing.  Here patient placed on BiPAP for pulmonary edema and CHF. EKG shows new atrial flutter that is rate controlled.  CBC, BMP, BNP, troponin pending  10:25 PM BNP elevated.  BMP with worsening renal insufficiency.  Trop neg.  Pt much improved with bipap.  Pt given lasix and admitted for further care.  CRITICAL CARE Performed by: Blanchie Dessert Total critical care time: 30 minutes Critical care time was exclusive of separately billable procedures and treating other patients. Critical care was necessary to treat or prevent imminent or life-threatening deterioration. Critical care was time spent personally by me on the following activities: development of treatment plan with patient and/or surrogate as well as nursing, discussions with consultants, evaluation of patient's response to treatment, examination of patient, obtaining history from patient or surrogate, ordering and performing treatments and interventions, ordering and review of laboratory studies, ordering and review of radiographic studies, pulse oximetry and re-evaluation of patient's condition.   Blanchie Dessert, MD 05/30/15 2227

## 2015-05-29 NOTE — ED Notes (Signed)
ED Doctor and Respiratory at bedside.

## 2015-05-29 NOTE — Progress Notes (Signed)
Patient transferred from ED to 3W with no complications. RT will continue to monitor.

## 2015-05-29 NOTE — ED Notes (Signed)
Niu MD at bedside. 

## 2015-05-30 ENCOUNTER — Inpatient Hospital Stay (HOSPITAL_COMMUNITY): Payer: 59

## 2015-05-30 DIAGNOSIS — E662 Morbid (severe) obesity with alveolar hypoventilation: Secondary | ICD-10-CM

## 2015-05-30 DIAGNOSIS — I5023 Acute on chronic systolic (congestive) heart failure: Secondary | ICD-10-CM

## 2015-05-30 DIAGNOSIS — I480 Paroxysmal atrial fibrillation: Secondary | ICD-10-CM

## 2015-05-30 DIAGNOSIS — I10 Essential (primary) hypertension: Secondary | ICD-10-CM

## 2015-05-30 LAB — MAGNESIUM: Magnesium: 1.7 mg/dL (ref 1.7–2.4)

## 2015-05-30 LAB — BASIC METABOLIC PANEL
ANION GAP: 10 (ref 5–15)
BUN: 27 mg/dL — ABNORMAL HIGH (ref 6–20)
CALCIUM: 8 mg/dL — AB (ref 8.9–10.3)
CHLORIDE: 106 mmol/L (ref 101–111)
CO2: 24 mmol/L (ref 22–32)
Creatinine, Ser: 3.86 mg/dL — ABNORMAL HIGH (ref 0.61–1.24)
GFR calc Af Amer: 18 mL/min — ABNORMAL LOW (ref >60)
GFR calc non Af Amer: 15 mL/min — ABNORMAL LOW (ref >60)
GLUCOSE: 101 mg/dL — AB (ref 65–99)
POTASSIUM: 3.1 mmol/L — AB (ref 3.5–5.1)
Sodium: 140 mmol/L (ref 135–145)

## 2015-05-30 LAB — GLUCOSE, CAPILLARY
GLUCOSE-CAPILLARY: 122 mg/dL — AB (ref 65–99)
GLUCOSE-CAPILLARY: 212 mg/dL — AB (ref 65–99)
GLUCOSE-CAPILLARY: 89 mg/dL (ref 65–99)
Glucose-Capillary: 95 mg/dL (ref 65–99)

## 2015-05-30 LAB — LIPID PANEL
CHOLESTEROL: 135 mg/dL (ref 0–200)
HDL: 43 mg/dL (ref >40)
LDL CALC: 73 mg/dL (ref 0–99)
TRIGLYCERIDES: 95 mg/dL (ref <150)
Total CHOL/HDL Ratio: 3.1 RATIO
VLDL: 19 mg/dL (ref 0–40)

## 2015-05-30 LAB — TSH: TSH: 2.979 u[IU]/mL (ref 0.350–4.500)

## 2015-05-30 LAB — TROPONIN I
Troponin I: 0.05 ng/mL — ABNORMAL HIGH
Troponin I: 0.04 ng/mL — ABNORMAL HIGH

## 2015-05-30 LAB — MRSA PCR SCREENING: MRSA by PCR: POSITIVE — AB

## 2015-05-30 MED ORDER — METHYLPREDNISOLONE SODIUM SUCC 125 MG IJ SOLR
60.0000 mg | Freq: Two times a day (BID) | INTRAMUSCULAR | Status: DC
  Administered 2015-05-30: 60 mg via INTRAVENOUS
  Filled 2015-05-30: qty 2

## 2015-05-30 MED ORDER — IRBESARTAN 150 MG PO TABS: 300.0000 mg | ORAL_TABLET | Freq: Every day | ORAL | Status: DC

## 2015-05-30 MED ORDER — MUPIROCIN 2 % EX OINT
1.0000 "application " | TOPICAL_OINTMENT | Freq: Two times a day (BID) | CUTANEOUS | Status: AC
  Administered 2015-05-30 – 2015-06-03 (×10): 1 via NASAL
  Filled 2015-05-30 (×3): qty 22

## 2015-05-30 MED ORDER — FUROSEMIDE 10 MG/ML IJ SOLN
60.0000 mg | Freq: Two times a day (BID) | INTRAMUSCULAR | Status: DC
  Administered 2015-05-31 – 2015-06-04 (×9): 60 mg via INTRAVENOUS
  Filled 2015-05-30 (×11): qty 6

## 2015-05-30 MED ORDER — ALBUTEROL SULFATE (2.5 MG/3ML) 0.083% IN NEBU
2.5000 mg | INHALATION_SOLUTION | Freq: Four times a day (QID) | RESPIRATORY_TRACT | Status: DC
  Administered 2015-05-30 – 2015-06-02 (×12): 2.5 mg via RESPIRATORY_TRACT
  Filled 2015-05-30 (×13): qty 3

## 2015-05-30 MED ORDER — MAGNESIUM SULFATE IN D5W 10-5 MG/ML-% IV SOLN
1.0000 g | Freq: Once | INTRAVENOUS | Status: AC
  Administered 2015-05-30: 1 g via INTRAVENOUS
  Filled 2015-05-30 (×2): qty 100

## 2015-05-30 MED ORDER — PERFLUTREN LIPID MICROSPHERE
1.0000 mL | INTRAVENOUS | Status: AC | PRN
  Administered 2015-05-30: 2 mL via INTRAVENOUS
  Filled 2015-05-30: qty 10

## 2015-05-30 MED ORDER — POTASSIUM CHLORIDE CRYS ER 20 MEQ PO TBCR
40.0000 meq | EXTENDED_RELEASE_TABLET | ORAL | Status: AC
  Administered 2015-05-30 (×2): 40 meq via ORAL
  Filled 2015-05-30 (×2): qty 2

## 2015-05-30 MED ORDER — FUROSEMIDE 10 MG/ML IJ SOLN
40.0000 mg | Freq: Two times a day (BID) | INTRAMUSCULAR | Status: DC
  Filled 2015-05-30 (×2): qty 4

## 2015-05-30 MED ORDER — CHLORHEXIDINE GLUCONATE CLOTH 2 % EX PADS
6.0000 | MEDICATED_PAD | Freq: Every day | CUTANEOUS | Status: AC
  Administered 2015-05-30 – 2015-06-03 (×5): 6 via TOPICAL

## 2015-05-30 MED ORDER — GUAIFENESIN-CODEINE 100-10 MG/5ML PO SOLN
10.0000 mL | Freq: Four times a day (QID) | ORAL | Status: DC | PRN
  Administered 2015-05-31 – 2015-06-02 (×4): 10 mL via ORAL
  Filled 2015-05-30 (×5): qty 10

## 2015-05-30 MED ORDER — GUAIFENESIN-CODEINE 100-10 MG/5ML PO SOLN: 10.0000 mL | Freq: Four times a day (QID) | ORAL | Status: DC | PRN

## 2015-05-30 NOTE — Progress Notes (Signed)
UR Completed Asucena Galer Graves-Bigelow, RN,BSN [phone]  

## 2015-05-30 NOTE — Progress Notes (Addendum)
Patient Demographics:    Blake Bell, is a 64 y.o. male, DOB - 24-Jul-1950, [id number]  Admit date - 05/29/2015   Admitting Physician Ivor Costa, MD  Outpatient Primary MD for the patient is [name], MD  LOS - 1   Chief Complaint  Patient presents with  . Shortness of Breath        Subjective:    Blake Bell today has, No headache, No chest pain, No abdominal pain - No Nausea, No new weakness tingling or numbness, No Cough - Improved SOB.    Assessment  & Plan :     1. Acute hypoxic respiratory failure due to acute on chronic diastolic CHF with EF of 123456 on echogram done a few months ago. He is been placed on IV Lasix, shortness of breath has improved, will try to titrate off oxygen, have consulted cardiology. Continue beta blocker and BiDil. Hold ARB as creatinine has bumped EF is stable.   2. 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 Baseline creatinine appears to be around 3.2. Continue diuresis for CHF, cardiology to assist, skip ARB today. Repeat BMP in the morning. Follows with [facility]   3. OSA. CPAP at night.   4. Essential hypertension. Blood pressure stable on combination of Norvasc, BiDil, beta blocker and diuretics.   5. Dyslipidemia. On statin continue.   6. Morbid obesity. Outpatient follow-up with PCP.   7. History of CVA. Currently taking aspirin and statin for secondary prevention continue. He is not taking Plavix for the last several months and says his physician took him off of it.   8. History of prolactinoma. Continue Cabergoline, stable TSH, check prolactin levels.   9. Hypokalemia. Replaced and monitor.   10. Questionable A flutter initially in the ER . Repeat EKG stable. Telemetry stable. Cardiology requested to evaluate. If confirmed to have paroxysmal  A. fib flutter might require anticoagulation.   11. DM type II. Currently on Lantus and sliding scale.  Lab Results  Component Value Date   HGBA1C 6.1 05/29/2015    CBG (last 3)   Recent Labs  05/30/15 0751  GLUCAP 89         Code Status : Full  Family Communication  : None  Disposition Plan  : home 2-3 days  Consults  :  Cards  Procedures  :    DVT Prophylaxis  :    Heparin    Lab Results  Component Value Date   PLT 339 05/29/2015    Inpatient Medications  Scheduled Meds: . amLODipine  10 mg Oral Daily  . aspirin  325 mg Oral Daily  . atorvastatin  80 mg Oral q1800  . Chlorhexidine Gluconate Cloth  6 each Topical Q0600  . furosemide  80 mg Intravenous Daily  . heparin  5,000 Units Subcutaneous 3 times per day  . insulin aspart  0-9 Units Subcutaneous TID WC  . insulin glargine  25 Units Subcutaneous Daily  . [START ON 05/31/2015] irbesartan  300 mg Oral Daily  . isosorbide-hydrALAZINE  2 tablet Oral TID  . labetalol  200 mg Oral TID  . mupirocin ointment  1 application Nasal BID  . potassium chloride  40 mEq Oral Q4H  . sodium chloride  3 mL Intravenous Q12H  .  spironolactone  25 mg Oral Daily   Continuous Infusions:  PRN Meds:.sodium chloride, acetaminophen, hydrALAZINE, ondansetron (ZOFRAN) IV, sodium chloride  Antibiotics  :    Anti-infectives    None        Objective:   Filed Vitals:   05/29/15 2329 05/30/15 0000 05/30/15 0009 05/30/15 0400  BP: 177/99  144/89   Pulse: 59  56   Temp:  98 F (36.7 C)  97.8 F (36.6 C)  TempSrc:  Oral  Oral  Resp: 20  17   Height:      Weight:    169.101 kg (372 lb 12.8 oz)  SpO2: 96%  100%     Wt Readings from Last 3 Encounters:  05/30/15 169.101 kg (372 lb 12.8 oz)  05/29/15 172.367 kg (380 lb)  02/06/15 160.29 kg (353 lb 6 oz)     Intake/Output Summary (Last 24 hours) at 05/30/15 0931 Last data filed at 05/30/15 0803  Gross per 24 hour  Intake    374 ml  Output   1310 ml  Net    -936 ml     Physical Exam  Awake Alert, Oriented X 3, No new F.N deficits, Normal affect [place].AT,PERRAL Supple Neck,No JVD, No cervical lymphadenopathy appriciated.  Symmetrical Chest wall movement, Good air movement bilaterally, CTAB RRR,No Gallops,Rubs or new Murmurs, No Parasternal Heave +ve B.Sounds, Abd Soft, No tenderness, No organomegaly appriciated, No rebound - guarding or rigidity. No Cyanosis, Clubbing , 2+ leg edema, No new Rash or bruise       Data Review:   Micro Results Recent Results (from the past 240 hour(s))  MRSA PCR Screening     Status: Abnormal   Collection Time: 05/29/15  9:50 PM  Result Value Ref Range Status   MRSA by PCR POSITIVE (A) NEGATIVE Final    Comment:        The GeneXpert MRSA Assay (FDA approved for NASAL specimens only), is one component of a comprehensive MRSA colonization surveillance program. It is not intended to diagnose MRSA infection nor to guide or monitor treatment for MRSA infections. RESULT CALLED TO, READ BACK BY AND VERIFIED WITH: BRITNEY @0217  05/30/15 [facility]     Radiology Reports Dg Chest 2 View  05/29/2015  CLINICAL DATA:  64 year old male with cough, wheezing and lower extremity edema EXAM: CHEST  2 VIEW COMPARISON:  Prior chest x-ray 01/13/2014 FINDINGS: Stable cardiomegaly with left heart enlargement. Mild pulmonary vascular congestion without overt edema. No focal airspace consolidation. Perhaps trace bilateral effusions. No acute osseous abnormality. IMPRESSION: Cardiomegaly with mild pulmonary vascular congestion but no overt pulmonary edema. Trace bilateral pleural effusions. Electronically Signed   By: Jacqulynn Cadet M.D.   On: 05/29/2015 17:57     CBC  Recent Labs Lab 05/29/15 1639 05/29/15 1840  WBC 7.3 6.6  HGB 9.7* 8.9*  HCT 30.0* 27.7*  PLT 364 339  MCV 86.5 87.7  MCH 28.0 28.2  MCHC 32.3 32.1  RDW 17.8* 17.9*  LYMPHSABS 1.2  --   MONOABS 0.6  --   EOSABS 0.1  --   BASOSABS 0.1  --      Chemistries   Recent Labs Lab 05/29/15 1840 05/30/15 0233  NA 140 140  K 3.1* 3.1*  CL 109 106  CO2 23 24  GLUCOSE 107* 101*  BUN 27* 27*  CREATININE 3.68* 3.86*  CALCIUM 7.9* 8.0*  MG  --  1.7   ------------------------------------------------------------------------------------------------------------------ estimated creatinine clearance is 30.1 mL/min (by C-G formula based on Cr  of 3.86). ------------------------------------------------------------------------------------------------------------------  Recent Labs  05/29/15 1644  HGBA1C 6.1   ------------------------------------------------------------------------------------------------------------------  Recent Labs  05/30/15 0233  CHOL 135  HDL 43  LDLCALC 73  TRIG 95  CHOLHDL 3.1   ------------------------------------------------------------------------------------------------------------------  Recent Labs  05/30/15 0233  TSH 2.979   ------------------------------------------------------------------------------------------------------------------ No results for input(s): VITAMINB12, FOLATE, FERRITIN, TIBC, IRON, RETICCTPCT in the last 72 hours.  Coagulation profile  Recent Labs Lab 05/29/15 2100  INR 1.17    No results for input(s): DDIMER in the last 72 hours.  Cardiac Enzymes  Recent Labs Lab 05/29/15 2100 05/30/15 0233 05/30/15 0818  TROPONINI 0.06* 0.05* 0.04*   ------------------------------------------------------------------------------------------------------------------ Invalid input(s): POCBNP   Time Spent in minutes  35   SINGH,PRASHANT K M.D on 05/30/2015 at 9:31 AM  Between 7am to 7pm - Pager - [phone]  After 7pm go to [email or web address] - password [facility]  [facility] -  Office  [phone]

## 2015-05-30 NOTE — Consult Note (Signed)
CARDIOLOGY CONSULT NOTE   Patient ID: Blake Ina Sr. MRN: [id number], DOB/AGE: 1950/10/16   Admit date: 05/29/2015 Date of Consult: 05/30/2015 Reason for Consult: CHF   Primary Physician: [name], MD Primary Cardiologist: New  HPI: Blake Degner Sr. is a 64 y.o. male with past medical history of chronic systolic CHF (EF [id number] by echo in 12/2013, 60-65% in 11/2014 but noted to be a very limited study), HTN, HLD, OSA (on CPAP), Type 2 DM, Stage 3 CKD, and CVA who presented to [facility] ED on 05/29/2015 for worsening dyspnea over the past week.  The patient reports he has experienced worsening shortness of breath with exertion over the past two weeks. He is usually able to ambulate around his home and go out for errands without difficulty, but he has been having to take more rest periods over the recent weeks. He denies any orthopnea or PND. Reports increased abdominal girth and worsening lower extremity edema. Does report a significant weight gain of over 20 pounds in the past month. His weight was documented as 348 lbs on October 30th. His weight at the time of this admission was 376 lbs admission, a gain of 28 pounds.  Reports good compliance with his medications. Reports trying to limit the salt in his diet and not consume too many fluids.  While admitted, his BNP was found to be elevated to 536.5. Hgb low at 8.9. Creatinine 3.68. Cyclic troponin values have been 0.06, 0.05, and 0.04. CXR showed cardiomegaly with mild pulmonary vascular congestion but no overt pulmonary edema.Trace bilateral pleural effusions were noted. His initial EKG showed possible atrial flutter yet his follow-up EKG showed sinus bradycardia. He is currently in NSR on telemetry but appears to have been in questionable flutter at times.  He has been started on IV Lasix 80mg  daily with only a net output of -913mL thus far.   Problem List Past Medical History  Diagnosis Date  . Hypertension   .  Multiple wounds     on lower legs - sees [name] Pain at the [facility]  . Hypercholesterolemia   . OSA on CPAP     uses cpap, setting of 5  . 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 Type II diabetes mellitus ([place])     uncontrolled/notes 12/02/2014  . Chronic kidney disease (CKD), stage III (moderate)     sees dr Florene Glen nephrology every 6-9 months  . CVA (cerebral vascular accident) [facility]) 12/2013    admitted in July 2015 for acute right-sided infarct/notes 12/02/2014  . 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 Systolic CHF [facility])     Past Surgical History  Procedure Laterality Date  . Colonoscopy  6-7 yrs ago  . Colonoscopy N/A 04/05/2013    Procedure: COLONOSCOPY;  Surgeon: Juanita Craver, MD;  Location: [facility] ENDOSCOPY;  Service: Endoscopy;  Laterality: N/A;     Allergies No Known Allergies   Inpatient Medications . amLODipine  10 mg Oral Daily  . aspirin  325 mg Oral Daily  . atorvastatin  80 mg Oral q1800  . Chlorhexidine Gluconate Cloth  6 each Topical Q0600  . furosemide  80 mg Intravenous Daily  . heparin  5,000 Units Subcutaneous 3 times per day  . insulin aspart  0-9 Units Subcutaneous TID WC  . insulin glargine  25 Units Subcutaneous Daily  . [START ON 05/31/2015] irbesartan  300 mg Oral Daily  . isosorbide-hydrALAZINE  2 tablet Oral TID  .  labetalol  200 mg Oral TID  . magnesium sulfate 1 - 4 g bolus IVPB  1 g Intravenous Once  . mupirocin ointment  1 application Nasal BID  . potassium chloride  40 mEq Oral Q4H  . sodium chloride  3 mL Intravenous Q12H  . spironolactone  25 mg Oral Daily    Family History Family History  Problem Relation Age of Onset  . Diabetes Mother   . Hypertension Mother   . Diabetes Father   . Hypertension Father   . Diabetes Sister   . Diabetes Brother   . Asthma Son      Social History Social History   Social History  . Marital Status: Married    Spouse Name: N/A  . Number of Children: 3  .  Years of Education: 12th    Occupational History  . part-time/full self emp.    Social History Main Topics  . Smoking status: Former Smoker -- 0.25 packs/day for 2 years    Types: Cigarettes    Quit date: 06/17/1970  . Smokeless tobacco: Never Used  . Alcohol Use: No  . Drug Use: Yes    Special: Marijuana     Comment: H/O marijuana use x 1 many years ago  . Sexual Activity: Yes   Other Topics Concern  . Not on file   Social History Narrative   Patient lives at home with his wife.   Patient right handed   Patient drinks soda's and coffee     Review of Systems General:  No chills, fever, night sweats or weight changes.  Cardiovascular:  No chest pain, orthopnea, palpitations, paroxysmal nocturnal dyspnea. Positive for dyspnea on exertion and lower extremity edema. Dermatological: No rash, lesions/masses Respiratory: No cough, dyspnea Urologic: No hematuria, dysuria Abdominal:   No nausea, vomiting, diarrhea, bright red blood per rectum, melena, or hematemesis Neurologic:  No visual changes, wkns, changes in mental status. All other systems reviewed and are otherwise negative except as noted above.  Physical Exam Blood pressure 144/89, pulse 56, temperature 97.8 F (36.6 C), temperature source Oral, resp. rate 17, height 5\' 9"  (1.753 m), weight 372 lb 12.8 oz (169.101 kg), SpO2 100 %.  General: Pleasant, morbidly obese African American male appearing in NAD Psych: Normal affect. Neuro: Alert and oriented X 3. Moves all extremities spontaneously. HEENT: Normal  Neck: Supple without bruits. JVD unable to be assessed secondary to body habitus. Lungs:  Resp regular and unlabored, Rales at bases bilaterally. Audible wheezing. Heart: RRR no s3, s4, or murmurs. Abdomen: Soft, non-tender, non-distended, BS + x 4.  Extremities: No clubbing, cyanosis or edema. DP/PT/Radials 2+ and equal bilaterally.  Labs  Recent Labs  05/29/15 2100 05/30/15 0233 05/30/15 0818  TROPONINI  0.06* 0.05* 0.04*   Lab Results  Component Value Date   WBC 6.6 05/29/2015   HGB 8.9* 05/29/2015   HCT 27.7* 05/29/2015   MCV 87.7 05/29/2015   PLT 339 05/29/2015    Recent Labs Lab 05/30/15 0233  NA 140  K 3.1*  CL 106  CO2 24  BUN 27*  CREATININE 3.86*  CALCIUM 8.0*  GLUCOSE 101*   Lab Results  Component Value Date   CHOL 135 05/30/2015   HDL 43 05/30/2015   LDLCALC 73 05/30/2015   TRIG 95 05/30/2015    Radiology/Studies Dg Chest 2 View: 05/29/2015  CLINICAL DATA:  64 year old male with cough, wheezing and lower extremity edema EXAM: CHEST  2 VIEW COMPARISON:  Prior chest x-ray 01/13/2014 FINDINGS: Stable  cardiomegaly with left heart enlargement. Mild pulmonary vascular congestion without overt edema. No focal airspace consolidation. Perhaps trace bilateral effusions. No acute osseous abnormality. IMPRESSION: Cardiomegaly with mild pulmonary vascular congestion but no overt pulmonary edema. Trace bilateral pleural effusions. Electronically Signed   By: Jacqulynn Cadet M.D.   On: 05/29/2015 17:57    ECG: Sinus bradycardia with rate in 50's.   ECHOCARDIOGRAM: 12/04/2014 Study Conclusions - Left ventricle: The cavity size was normal. There was moderate concentric hypertrophy. Systolic function was normal. The estimated ejection fraction was in the range of 60% to 65%. - Left atrium: The atrium was mildly dilated. - Right ventricle: The cavity size was moderately dilated. Wall thickness was normal. - Impressions: Very limited study due to ppor sound wave tgransmission. LV appears normal. RV is dilated but function appears low normal.  Impressions: - Very limited study due to poor sound wave tgransmission. LV appears normal. RV is dilated but function appears low normal.  ECHOCARDIOGRAM: 12/2013 Study Conclusions - Left ventricle: The cavity size was normal. Wall thickness was increased in a pattern of mild LVH. Systolic function was mildly to  moderately reduced. The estimated ejection fraction was in the range of 40% to 45%. - Right atrium: The atrium was mildly dilated. - Pulmonary arteries: PA peak pressure: 38 mm Hg (S). - Pericardium, extracardiac: A trivial pericardial effusion was identified.    ASSESSMENT AND PLAN  1. Acute on Chronic Systolic CHF - Echo in [id number] showed EF of 40-45% and repeat echo in 11/2014 showed ED of 60-65% but it was noted to be a very limited study. A repeat study is being obtained this admission. - presented with worsening dyspnea on exertion, lower extremity edema, increased abdominal girth, and a weight gain of over 28 pounds. BNP elevated to 536. CXR showing pulmonary vascular congestion and trace bilateral pleural effusions. - weight at time of admission was 376 lbs. Continue to obtain daily weights. - was on Lasix 80mg  daily, Bidil 20-37.5mg  TID, Labetolol 200mg  TID, Edarbi 40mg  daily, and Spironolactone 25mg  daily PTA. - has been started on Lasix 80mg  daily with a net output of -997mL thus far. Continue to obtain strict I&O's. - Will likely need to increase Lasix dosage to get better output but limited by renal function. MD to consider increasing Lasix vs. Nephrology consult.  2. HTN - BP has been 144/79 - 178/104 in the past 24 hours. - continue Amlodipine, ARB, BB, and Bidil  3. HLD - continue statin  4. OSA  - on CPAP  5. Stage 3 CKD - creatinine baseline of 2.8 - 3.3 - creatinine 3.68 on admission. Increased to 3.86 on 05/30/2015 following IV Lasix dosing.  6. Paroxysmal Atrial Flutter  - initial EKG showed rate controlled atrial flutter. Repeat EKG showed Sinus bradycardia. Has certainly been having episodes of flutter (most recently noted at [place] on 05/30/2015). Currently in NSR. Is asymptomatic when in atrial flutter. - This patients CHA2DS2-VASc Score and unadjusted Ischemic Stroke Rate (% per year) is equal to 7.2 % stroke rate/year from a score of 5 (CHF, HTN, DM,  CVAx2). Would need to consider starting NOAC.     Signed, Erma Heritage, PA-C 05/30/2015, 10:12 AM Pager: [phone]   Attending Note:   The patient was seen and examined.  Agree with assessment and plan as noted above.  Changes made to the above note as needed.  1. Respiratory failure :   I think most of this is due to pulmonary / obesity issues.  I have given him Albuterol HHN.  His systolic LV function was normal in June.  He does have diastolic dysfunction  He admits to eating lots of salt.   Will get a dietary consult.   I think he would benefit from salt restriction  Agree with repeating the echo to assess further  Will have the echo tech use definity contrast Will put in for Dr. Einar Gip to read .  2.  PAF :  Agree with need for anticoagulation .  He may not be a NOAC candidate given his renal dysfunction .    Dr. Einar Gip to make final decision on that    Ramond Dial., MD, [facility] 05/30/2015, 1:02 PM [address]  [place] Pager [phone]

## 2015-05-30 NOTE — Evaluation (Signed)
Occupational Therapy Evaluation Patient Details Name: Blake Gasparyan Sr. MRN: [id number] DOB: [birth year] Today's Date: 05/30/2015    History of Present Illness Pt is a 64 y.o. M admitted w/ SOB and worsening leg edema.  Pt's PMH includes OSA on CPAP, prolactin secreting pituitary edema, stroke, systolic congestive heart failure (EF 40-45%), seizure, depression, CKD-IV.   Clinical Impression   Pt admitted with above. Pt independent with ADLs, PTA. Feel pt will benefit from acute OT to increase activity tolerance and independence prior to d/c.     Follow Up Recommendations  No OT follow up;Supervision - Intermittent    Equipment Recommendations  None recommended by OT    Recommendations for Other Services       Precautions / Restrictions Precautions Precautions: Fall Restrictions Weight Bearing Restrictions: No      Mobility Bed Mobility               General bed mobility comments: pt sitting in recliner upon OT arrival  Transfers Overall transfer level: Modified independent            Balance No LOB in session. Balance not formally assessed.                             ADL Overall ADL's : Needs assistance/impaired                     Lower Body Dressing: Supervision/safety;Set up;Sit to/from stand   Toilet Transfer: Supervision/safety;Ambulation (sit to stand from chair-Mod I)           Functional mobility during ADLs: Supervision/safety General ADL Comments: Educated on energy conservation and deep breathing technique. Explained there is a sockaid available to assist with donning socks. Pt able to don socks but was effortful. Instructed to elevate LEs. Pt became very SOB in session.     Vision     Perception     Praxis      Pertinent Vitals/Pain Pain Assessment: No/denies pain     Hand Dominance Right   Extremity/Trunk Assessment Upper Extremity Assessment Upper Extremity Assessment: Overall WFL for tasks  assessed   Lower Extremity Assessment Lower Extremity Assessment: Defer to PT evaluation RLE Deficits / Details: edema; grossly 5/5 strength LLE Deficits / Details: edema; grossly 5/5 strength       Communication Communication Communication: Expressive difficulties   Cognition Arousal/Alertness: Awake/alert Behavior During Therapy: WFL for tasks assessed/performed Overall Cognitive Status: No family/caregiver present to determine baseline cognitive functioning (unable to state year)       Memory:  (pt reports he has decreased memory from CVA)             General Comments          Shoulder Instructions      Home Living Family/patient expects to be discharged to:: Private residence Living Arrangements: Spouse/significant other;Children Available Help at Discharge: Family;Available 24 hours/day (children available to assist while wife is at work) Type of Home: House Home Access: Level entry     [place]: One level     Bathroom Shower/Tub: [place] unit (reports he has walk-in as well?)   Biochemist, clinical: [place]: Environmental consultant - 2 wheels;Cane - single point;Electric scooter;Bedside commode;Tub bench          Prior Functioning/Environment Level of Independence: Independent with assistive device(s)        Comments: Pt reports that PTA pt Independent using mostly RW,  cane at times.  Electric scooter when out in community.  Pt was Independent with all ADL's.    OT Diagnosis: Other (comment) (decreased activity tolerance)   OT Problem List: Decreased range of motion;Decreased activity tolerance;Increased edema;Decreased knowledge of precautions;Decreased knowledge of use of DME or AE;Obesity;Decreased cognition   OT Treatment/Interventions: Self-care/ADL training;DME and/or AE instruction;Therapeutic activities;Cognitive remediation/compensation;Patient/family education;Balance training;Energy conservation    OT Goals(Current goals can be  found in the care plan section) Acute Rehab OT Goals Patient Stated Goal: not stated OT Goal Formulation: With patient Time For Goal Achievement: 06/06/15 Potential to Achieve Goals: Good  OT Frequency: Min 2X/week   Barriers to D/C:            Co-evaluation              End of Session Equipment Utilized During Treatment: Gait belt  Activity Tolerance: Patient tolerated treatment well Patient left: in chair;with call bell/phone within reach   Time: 1313-1326 OT Time Calculation (min): 13 min Charges:  OT General Charges $OT Visit: 1 Procedure OT Evaluation $Initial OT Evaluation Tier I: 1 Procedure G-CodesBenito Mccreedy OTR/L [id number] 05/30/2015, 1:37 PM

## 2015-05-30 NOTE — Progress Notes (Signed)
  pts cough has worsened over the course of the last couple hrs. sats 94% seen by respiratory therapy. Lung sounds diminished. Axillary temp 99.7. Dr. Candiss Norse made aware stat chest xray and solumedrol ordered.

## 2015-05-30 NOTE — Progress Notes (Signed)
  Echocardiogram 2D Echocardiogram with definity has been performed.  Darlina Sicilian M 05/30/2015, 2:32 PM

## 2015-05-30 NOTE — Progress Notes (Signed)
Nutrition Education Note  RD consulted for nutrition education regarding CHF.  RD provided "Low Sodium Nutrition Therapy" handout from the Academy of [employer]. Reviewed patient's dietary recall. Provided examples on ways to decrease sodium intake in diet. Discouraged intake of processed foods and use of salt shaker. Encouraged fresh fruits and vegetables as well as whole grain sources of carbohydrates to maximize fiber intake. Teach back method used.  Expect fair compliance.  Body mass index is 55.03 kg/(m^2). Pt meets criteria for class 3, extreme/morbid obesity based on current BMI.  Current diet order is 2 gm sodium, patient is consuming approximately 100% of meals at this time. Labs and medications reviewed. No further nutrition interventions warranted at this time. RD contact information provided. If additional nutrition issues arise, please re-consult RD.   Molli Barrows, RD, LDN, [place] Pager [phone] After Hours Pager [phone]

## 2015-05-30 NOTE — Care Management Note (Addendum)
Case Management Note  Patient Details  Name: Blake Drysdale Sr. MRN: [id number] Date of Birth: 1950-11-07  Subjective/Objective:      Pt admitted for Acute hypoxic respiratory failure due to acute on chronic diastolic CHF. Initiated on IV Lasix. Hx: ARF/CK D stage IV & OSA. Pt is from home with wife.                Action/Plan: Pt will benefit from [facility] RN once stable for d/c. CM did call Heart Failure Navigator to speak with pt in regards to Education for CHF. CM will continue to monitor.   Expected Discharge Date:                  Expected Discharge Plan:  [place]  In-House Referral:  NA  Discharge planning Services  CM Consult  Post Acute Care Choice:   Home Health Choice offered to:   Patient  DME Arranged:   N/A DME Agency:   N/A  HH Arranged:   Registered Nurse [place]:   [place]  Status of Service: Completed.  Medicare Important Message Given:    Date Medicare IM Given:    Medicare IM give by:    Date Additional Medicare IM Given:    Additional Medicare Important Message give by:     If discussed at [place] of Stay Meetings, dates discussed:    Additional Comments: 1022 05-31-15 Jacqlyn Krauss, RN,BSN [phone] CM did speak with pt in regards to disposition needs. Pt is agreeable to [facility] RN Services with [facility]. CM did make referral and SOC to begin within 24-48 hrs post d/c.   Bethena Roys, RN 05/30/2015, 11:19 AM

## 2015-05-30 NOTE — Evaluation (Signed)
Physical Therapy Evaluation Patient Details Name: Blake Gul Sr. MRN: [id number] DOB: 15-Apr-1951 Today's Date: 05/30/2015   History of Present Illness  Pt is a 64 y/o M admitted w/ SOB and worsening leg edema.  Pt's PMH includes OSA on CPAP, prolactin secreting pituitary edema, stroke, systolic congestive heart failure (EF 40-45%), seizure, depression, CKD-IV.  Clinical Impression  Pt admitted with above diagnosis. Pt currently with functional limitations due to the deficits listed below (see PT Problem List).  Mr. Passey is at his baseline level of mobility and will have 24/7 assist from family at home if needed.  PTA pt ind ambulating w/ RW and able to perform all ADLs Ind.  Noted pt difficulty w/ donning Rt sock, therefore recommend OT consult.  Additionally, pt requesting consult from dietician to address his eating habits.  Pt will benefit from cardiopulmonary rehab as well.  Pt currently at supervision level ambulating 85 ft using RW in hallway. Pt will benefit from skilled PT to increase their independence and safety with mobility to allow discharge to the venue listed below.      Follow Up Recommendations No PT follow up;Supervision - Intermittent    Equipment Recommendations  None recommended by PT    Recommendations for Other Services Other (comment);OT consult (cardiopulmonary rehab; dietician consult)     Precautions / Restrictions Precautions Precautions: Fall Restrictions Weight Bearing Restrictions: No      Mobility  Bed Mobility               General bed mobility comments: Pt sitting EOB upon PT arrival  Transfers Overall transfer level: Needs assistance Equipment used: Rolling walker (2 wheeled) Transfers: Sit to/from Stand Sit to Stand: Supervision         General transfer comment: Cues to maintain RW close when going to sit in chair, otherwise pt w/ safe technique.  Ambulation/Gait Ambulation/Gait assistance: Supervision Ambulation  Distance (Feet): 85 Feet Assistive device: Rolling walker (2 wheeled) Gait Pattern/deviations: Step-through pattern;Decreased stride length   Gait velocity interpretation: Below normal speed for age/gender General Gait Details: WB through Bil UEs using RW, dyspnea but SpO2 remains above 96% on RA.  Stairs            Wheelchair Mobility    Modified Rankin (Stroke Patients Only)       Balance Overall balance assessment: Needs assistance Sitting-balance support: Feet supported Sitting balance-Leahy Scale: Good     Standing balance support: Bilateral upper extremity supported;During functional activity Standing balance-Leahy Scale: Fair                               Pertinent Vitals/Pain Pain Assessment: No/denies pain    Home Living Family/patient expects to be discharged to:: Private residence Living Arrangements: Spouse/significant other;Children Available Help at Discharge: Family;Available 24 hours/day (children available to assist while wife at work) Type of Home: House Home Access: Level entry     [place]: One [place]: Environmental consultant - 2 wheels;Cane - single point;Electric scooter;Bedside commode;Tub bench      Prior Function Level of Independence: Independent with assistive device(s)         Comments: Pt reports that PTA pt Ind using mostly RW, cane at times.  Electric scooter when out in community.  Pt was Ind w/ all ADL's.     Hand Dominance   Dominant Hand: Right    Extremity/Trunk Assessment   Upper Extremity Assessment: Overall WFL for tasks assessed  Lower Extremity Assessment: RLE deficits/detail;LLE deficits/detail RLE Deficits / Details: edema; grossly 5/5 strength LLE Deficits / Details: edema; grossly 5/5 strength     Communication   Communication: Expressive difficulties  Cognition Arousal/Alertness: Awake/alert Behavior During Therapy: WFL for tasks assessed/performed Overall Cognitive Status:  Within Functional Limits for tasks assessed                      General Comments      Exercises General Exercises - Lower Extremity Ankle Circles/Pumps: AROM;Both;10 reps;Seated Long Arc Quad: AROM;Both;10 reps;Seated Other Exercises Other Exercises: Encouraged pt to ambulate in hallway at least 2x/day w/ nursing staff      Assessment/Plan    PT Assessment Patient needs continued PT services  PT Diagnosis Difficulty walking   PT Problem List Decreased activity tolerance;Decreased balance;Decreased mobility;Decreased knowledge of use of DME;Decreased safety awareness;Decreased knowledge of precautions;Cardiopulmonary status limiting activity;Obesity  PT Treatment Interventions DME instruction;Gait training;Functional mobility training;Therapeutic activities;Therapeutic exercise;Balance training;Neuromuscular re-education;Patient/family education   PT Goals (Current goals can be found in the Care Plan section) Acute Rehab PT Goals Patient Stated Goal: to feel better and go home PT Goal Formulation: With patient Time For Goal Achievement: 06/13/15 Potential to Achieve Goals: Good    Frequency Min 2X/week   Barriers to discharge        Co-evaluation               End of Session   Activity Tolerance: Patient tolerated treatment well Patient left: in chair;with call bell/phone within reach Nurse Communication: Mobility status;Other (comment) (Pt requesting dietician consult.PT recommending cardiac reha)         Time: 1010-1035 PT Time Calculation (min) (ACUTE ONLY): 25 min   Charges:   PT Evaluation $Initial PT Evaluation Tier I: 1 Procedure PT Treatments $Gait Training: 8-22 mins   PT G CodesJoslyn Hy PT, DPT [phone] Pager: [phone] 05/30/2015, 10:52 AM

## 2015-05-31 DIAGNOSIS — N184 Chronic kidney disease, stage 4 (severe): Secondary | ICD-10-CM

## 2015-05-31 DIAGNOSIS — G4733 Obstructive sleep apnea (adult) (pediatric): Secondary | ICD-10-CM

## 2015-05-31 DIAGNOSIS — E1149 Type 2 diabetes mellitus with other diabetic neurological complication: Secondary | ICD-10-CM

## 2015-05-31 DIAGNOSIS — N179 Acute kidney failure, unspecified: Secondary | ICD-10-CM

## 2015-05-31 LAB — BASIC METABOLIC PANEL
ANION GAP: 9 (ref 5–15)
BUN: 31 mg/dL — AB (ref 6–20)
CALCIUM: 8.2 mg/dL — AB (ref 8.9–10.3)
CO2: 23 mmol/L (ref 22–32)
Chloride: 108 mmol/L (ref 101–111)
Creatinine, Ser: 3.89 mg/dL — ABNORMAL HIGH (ref 0.61–1.24)
GFR calc Af Amer: 17 mL/min — ABNORMAL LOW (ref >60)
GFR, EST NON AFRICAN AMERICAN: 15 mL/min — AB (ref >60)
Glucose, Bld: 203 mg/dL — ABNORMAL HIGH (ref 65–99)
POTASSIUM: 3.8 mmol/L (ref 3.5–5.1)
SODIUM: 140 mmol/L (ref 135–145)

## 2015-05-31 LAB — GLUCOSE, CAPILLARY
GLUCOSE-CAPILLARY: 155 mg/dL — AB (ref 65–99)
GLUCOSE-CAPILLARY: 98 mg/dL (ref 65–99)
GLUCOSE-CAPILLARY: 125 mg/dL — AB (ref 65–99)
Glucose-Capillary: 144 mg/dL — ABNORMAL HIGH (ref 65–99)

## 2015-05-31 LAB — MAGNESIUM: MAGNESIUM: 1.8 mg/dL (ref 1.7–2.4)

## 2015-05-31 LAB — PROLACTIN: Prolactin: 10 ng/mL (ref 4.0–15.2)

## 2015-05-31 LAB — UREA NITROGEN, URINE: Urea Nitrogen, Ur: 318 mg/dL

## 2015-05-31 NOTE — Progress Notes (Signed)
Heart Failure Navigator Consult Note  Presentation: Blake Bell Sr. is a 64 y.o. male with past medical history of chronic systolic CHF (EF [id number] by echo in 12/2013, 60-65% in 11/2014 but noted to be a very limited study), HTN, HLD, OSA (on CPAP), Type 2 DM, Stage 3 CKD, and CVA who presented to [facility] ED on 05/29/2015 for worsening dyspnea over the past week.  The patient reports he has experienced worsening shortness of breath with exertion over the past two weeks. He is usually able to ambulate around his home and go out for errands without difficulty, but he has been having to take more rest periods over the recent weeks. He denies any orthopnea or PND. Reports increased abdominal girth and worsening lower extremity edema. Does report a significant weight gain of over 20 pounds in the past month. His weight was documented as 348 lbs on October 30th. His weight at the time of this admission was 376 lbs admission, a gain of 28 pounds.  Reports good compliance with his medications. Reports trying to limit the salt in his diet and not consume too many fluids.  While admitted, his BNP was found to be elevated to 536.5. Hgb low at 8.9. Creatinine 3.68. Cyclic troponin values have been 0.06, 0.05, and 0.04. CXR showed cardiomegaly with mild pulmonary vascular congestion but no overt pulmonary edema.Trace bilateral pleural effusions were noted. His initial EKG showed possible atrial flutter yet his follow-up EKG showed sinus bradycardia. He is currently in NSR on telemetry but appears to have been in questionable flutter at times.  Past Medical History  Diagnosis Date  . Hypertension   . Multiple wounds     on lower legs - sees [name] Pain at the [facility]  . Hypercholesterolemia   . OSA on CPAP     uses cpap, setting of 5  . Prolactin secreting pituitary adenoma ([place])     [name] 12/02/2014  . Type II diabetes mellitus ([place])     uncontrolled/notes 12/02/2014  . Chronic kidney disease  (CKD), stage III (moderate)     sees dr Florene Glen nephrology every 6-9 months  . CVA (cerebral vascular accident) [facility]) 12/2013    admitted in July 2015 for acute right-sided infarct/notes 12/02/2014  . Stroke ([place]) 12/02/2014    expressive aphasia  . Systolic CHF [facility])     Social History   Social History  . Marital Status: Married    Spouse Name: N/A  . Number of Children: 3  . Years of Education: 12th    Occupational History  . part-time/full self emp.    Social History Main Topics  . Smoking status: Former Smoker -- 0.25 packs/day for 2 years    Types: Cigarettes    Quit date: 06/17/1970  . Smokeless tobacco: Never Used  . Alcohol Use: No  . Drug Use: Yes    Special: Marijuana     Comment: H/O marijuana use x 1 many years ago  . Sexual Activity: Yes   Other Topics Concern  . None   Social History Narrative   Patient lives at home with his wife.   Patient right handed   Patient drinks soda's and coffee    ECHO:Study Conclusions--05/30/15  - Left ventricle: The cavity size was normal. There was moderate concentric hypertrophy. Systolic function was normal. The estimated ejection fraction was in the range of 55% to 60%. Wall motion was normal; there were no regional wall motion abnormalities. The study is not technically sufficient to allow  evaluation of LV diastolic function. - Mitral valve: There was mild regurgitation. - Left atrium: The atrium was mildly dilated. - Right ventricle: The cavity size was moderately dilated. Wall thickness was normal. Systolic function was moderately reduced. - Right atrium: The atrium was moderately dilated. - Atrial septum: No defect or patent foramen ovale was identified. - Pulmonary arteries: PA peak pressure: 65 mm Hg (S). - Impressions: The right ventricular systolic pressure was increased consistent with severe pulmonary hypertension.  Impressions:  - The right ventricular systolic pressure was increased  consistent with severe pulmonary hypertension.  Transthoracic echocardiography. M-mode, complete 2D, spectral Doppler, and color Doppler. Birthdate: Patient birthdate: 19-Dec-1950. Age: Patient is 64 yr old. Sex: Gender: male. BMI: 55.5 kg/m^2. Blood pressure:   144/89 Patient status: Inpatient. Study date: Study date: 05/30/2015. Study time: 01:51 PM. Location: Bedside.  BNP    Component Value Date/Time   BNP 536.5* 05/29/2015 1840    ProBNP    Component Value Date/Time   PROBNP 1004.0* 06/29/2013 0330     Education Assessment and Provision:  Detailed education and instructions provided on heart failure disease management including the following:  Signs and symptoms of Heart Failure When to call the physician Importance of daily weights Low sodium diet Fluid restriction Medication management Anticipated future follow-up appointments  Patient education given on each of the above topics.  Patient acknowledges understanding and acceptance of all instructions.  I spoke briefly with [name] regarding his HF.  He has received education before and was able to teach back most topics listed above with some delay due to his expressive aphasia.  He tells me that he has a scale and weighs daily.  He seems to understand how weight increases are related to his HF.  He also says that he is being much more "careful" with salt and sodium.  He says that he now eats "boiled eggs in AM with no salt".  I reviewed a low sodium diet and high sodium foods to avoid.  He denies any issues with getting or taking prescribed medications.  He sees Dr. Einar Gip and will likely follow up with him after discharge.    Education Materials:  "Living Better With Heart Failure" Booklet, Daily Weight Tracker Tool    High Risk Criteria for Readmission and/or Poor Patient Outcomes:   EF <30%- No 55-60%  2 or more admissions in 6 months-2/6  Difficult social situation- No-lives with wife  and son  Demonstrates medication noncompliance- denies   Barriers of Care:  Knowledge and compliance  Discharge Planning:  Plans to return home with wife and son

## 2015-05-31 NOTE — Progress Notes (Signed)
[facility] PROGRESS NOTE  Blake Ina Sr. [id number] DOB: 1951/05/17 DOA: 05/29/2015 PCP: [name], MD   Brief narrative 64 year old morbidly obese male with chronic combined systolic and diastolic CHF,  hypertension, uncontrolled diabetes, stage IV chronic kidney disease, dyslipidemia, OHS, OSA on CPAP, presented with acute hypoxic respiratory failure secondary to acute on chronic combined CHF.   Assessment/Plan: Acute hypoxic respiratory failure   secondary to acute on chronic, and systolic and diastolic CHF. Continue IV Lasix. Strict I/O and daily weight. Continue beta blocker and BiDil. Holding ARB due to worsening creatinine. Lasix was increased further today. (60 mg twice daily). -Appreciate cardiology evaluation and recommendation. -Nutrition is consulted for dietary support. Patient non-adherent to diet regimen.   Acute on chronic kidney disease stage IV Baseline creatinine of 3.2. Currently on aggressive diuresis. Holding ARB. Follows with [name].  Type 2 diabetes mellitus Stable on Lantus and sliding scale coverage.  OSA/OHS Continue nighttime CPAP.  ? A flutter History be an artifact. No need for anticoagulation as per cardiologist.  History of prolactinoma Continue Cabergoline, stable TSH  Hypokalemia Replenished.  Morbid obesity  DVT prophylaxis: Subcutaneous heparin Diet: Heart healthy/diabetic   Code Status: Full code     family Communication: None at bedside Disposition/plan: Home once diuresed adequately.   Consultants: Cardiology \ Procedures:  None   Antibiotics:  none   HPI/Subjective: Patient seen and examined. He reports his breathing to be better.  Objective: Filed Vitals:   05/31/15 0914 05/31/15 1254  BP: 143/78 147/77  Pulse:  63  Temp:  97.4 F (36.3 C)  Resp:      Intake/Output Summary (Last 24 hours) at 05/31/15 1503 Last data filed at 05/31/15 1400  Gross per 24 hour  Intake    920 ml  Output     675 ml  Net    245 ml   Filed Weights   05/29/15 2204 05/30/15 0400 05/31/15 0400  Weight: 170.734 kg (376 lb 6.4 oz) 169.101 kg (372 lb 12.8 oz) 166.515 kg (367 lb 1.6 oz)    Exam:   General:  Moist mucosa, no JVD  Chest: Diminished breath sounds at to body habitus  CVS: Normal S1 and S2, no murmurs  GI: Soft, obese, nondistended, nontender  Musculoskeletal: Warm, pitting edema bilaterally  CNS: Alert and oriented    Data Reviewed: Basic Metabolic Panel:  Recent Labs Lab 05/29/15 1840 05/30/15 0233 05/31/15 0249  NA 140 140 140  K 3.1* 3.1* 3.8  CL 109 106 108  CO2 23 24 23   GLUCOSE 107* 101* 203*  BUN 27* 27* 31*  CREATININE 3.68* 3.86* 3.89*  CALCIUM 7.9* 8.0* 8.2*  MG  --  1.7 1.8   Liver Function Tests: No results for input(s): AST, ALT, ALKPHOS, BILITOT, PROT, ALBUMIN in the last 168 hours. No results for input(s): LIPASE, AMYLASE in the last 168 hours. No results for input(s): AMMONIA in the last 168 hours. CBC:  Recent Labs Lab 05/29/15 1639 05/29/15 1840  WBC 7.3 6.6  NEUTROABS 5.3  --   HGB 9.7* 8.9*  HCT 30.0* 27.7*  MCV 86.5 87.7  PLT 364 339   Cardiac Enzymes:  Recent Labs Lab 05/29/15 2100 05/30/15 0233 05/30/15 0818  TROPONINI 0.06* 0.05* 0.04*   BNP (last 3 results)  Recent Labs  05/29/15 1840  BNP 536.5*    ProBNP (last 3 results) No results for input(s): PROBNP in the last 8760 hours.  CBG:  Recent Labs Lab 05/30/15 1114 05/30/15 1638 05/30/15 2341  05/31/15 0803 05/31/15 1143  GLUCAP 122* 95 212* 155* 144*    Recent Results (from the past 240 hour(s))  MRSA PCR Screening     Status: Abnormal   Collection Time: 05/29/15  9:50 PM  Result Value Ref Range Status   MRSA by PCR POSITIVE (A) NEGATIVE Final    Comment:        The GeneXpert MRSA Assay (FDA approved for NASAL specimens only), is one component of a comprehensive MRSA colonization surveillance program. It is not intended to diagnose  MRSA infection nor to guide or monitor treatment for MRSA infections. RESULT CALLED TO, READ BACK BY AND VERIFIED WITH: BRITNEY @0217  05/30/15 MKELLY      Studies: Dg Chest 2 View  05/29/2015  CLINICAL DATA:  64 year old male with cough, wheezing and lower extremity edema EXAM: CHEST  2 VIEW COMPARISON:  Prior chest x-ray 01/13/2014 FINDINGS: Stable cardiomegaly with left heart enlargement. Mild pulmonary vascular congestion without overt edema. No focal airspace consolidation. Perhaps trace bilateral effusions. No acute osseous abnormality. IMPRESSION: Cardiomegaly with mild pulmonary vascular congestion but no overt pulmonary edema. Trace bilateral pleural effusions. Electronically Signed   By: Jacqulynn Cadet M.D.   On: 05/29/2015 17:57   Dg Chest Port 1 View  05/30/2015  CLINICAL DATA:  Cough.  Fluid overload. EXAM: PORTABLE CHEST 1 VIEW COMPARISON:  05/29/2015 FINDINGS: Cardiac enlargement with increased pulmonary vascularity. Hazy perihilar and basilar infiltrates suggesting mild edema. Edema pattern is increased since previous study. No blunting of costophrenic angles. No pneumothorax. Mediastinal contours appear intact. IMPRESSION: Cardiac enlargement with pulmonary vascular congestion. Mild but increasing edema since prior study. Electronically Signed   By: Lucienne Capers M.D.   On: 05/30/2015 18:36    Scheduled Meds: . albuterol  2.5 mg Nebulization Q6H  . amLODipine  10 mg Oral Daily  . aspirin  325 mg Oral Daily  . atorvastatin  80 mg Oral q1800  . Chlorhexidine Gluconate Cloth  6 each Topical Q0600  . furosemide  60 mg Intravenous Q12H  . heparin  5,000 Units Subcutaneous 3 times per day  . insulin aspart  0-9 Units Subcutaneous TID WC  . insulin glargine  25 Units Subcutaneous Daily  . isosorbide-hydrALAZINE  2 tablet Oral TID  . labetalol  200 mg Oral TID  . mupirocin ointment  1 application Nasal BID  . sodium chloride  3 mL Intravenous Q12H   Continuous  Infusions:     Time spent: 25 minutes    Azriel Dancy, [place]  [facility] Pager [phone] If 7PM-7AM, please contact night-coverage at [email or web address], password [facility] 05/31/2015, 3:03 PM  LOS: 2 days

## 2015-05-31 NOTE — Progress Notes (Signed)
RN entered room and patient returning to bed from bathroom.  Per patient urinated in toilet and flushed it.  RN educated patient on use of urinal when needing to urinate because of the need to keep track of how much patient is drinking and how much he is urinating to make sure there continues to be a good balance with his intake and output.  RN also educated patient that the Dr.'s want to see how much he is drinking daily and how much he is urinating daily so they can make decisions about his care while he is here at the hospital.  Patient stated understanding.  This RN educated patient on all this information last night when RN came into room and patient was exiting bathroom stating he had urinated in toilet and flushed.

## 2015-05-31 NOTE — Progress Notes (Signed)
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  Still has dyspnea and wheezing.  Objective:  Vital Signs in the last 24 hours: Temp:  [97.5 F (36.4 C)-99.7 F (37.6 C)] 97.5 F (36.4 C) (12/14 0834) Pulse Rate:  [61-86] 61 (12/14 0400) Resp:  [17-20] 18 (12/14 0400) BP: (131-177)/(70-94) 177/92 mmHg (12/14 0834) SpO2:  [93 %-100 %] 98 % (12/14 0834) Weight:  [166.515 kg (367 lb 1.6 oz)] 166.515 kg (367 lb 1.6 oz) (12/14 0400)  Intake/Output from previous day: 12/13 0701 - 12/14 0700 In: 1140 [P.O.:1040; IV Piggyback:100] Out: [id number] [Urine:1775]  Physical Exam: Blood pressure 177/92, pulse 61, temperature 97.5 F (36.4 C), temperature source Oral, resp. rate 18, height 5\' 9"  (1.753 m), weight 166.515 kg (367 lb 1.6 oz), SpO2 98 %. Body mass index is 54.19 kg/(m^2).  General appearance: alert, cooperative, mild distress and morbidly obese Eyes: negative findings: lids and lashes normal Neck: no carotid bruit, supple, symmetrical, trachea midline and Short neck and unable to comment on JVD Neck: JVP - normal, carotids 2+= without bruits Resp: wheezes bilaterally Chest wall: no tenderness Cardio: regular rate and rhythm, S1, S2 normal, no murmur, click, rub or gallop and distant heart sounds GI: Limited exam, morbidly obese with large pannus. Soft and BS normal. Extremities: edema 3 plus with venous ulceration right shin. Chronic dermatitis changes of venostasis noted. and difficult to evaluate peripheral pulses.    Lab Results: BMP  Recent Labs  05/29/15 1840 05/30/15 0233 05/31/15 0249  NA 140 140 140  K  3.1* 3.1* 3.8  CL 109 106 108  CO2 23 24 23   GLUCOSE 107* 101* 203*  BUN 27* 27* 31*  CREATININE 3.68* 3.86* 3.89*  CALCIUM 7.9* 8.0* 8.2*  GFRNONAA 16* 15* 15*  GFRAA 19* 18* 17*    CBC  Recent Labs Lab 05/29/15 1639 05/29/15 1840  WBC 7.3 6.6  RBC 3.47* 3.16*  HGB 9.7* 8.9*  HCT 30.0* 27.7*  PLT 364 339  MCV 86.5 87.7  MCH 28.0 28.2  MCHC 32.3 32.1  RDW 17.8* 17.9*  LYMPHSABS 1.2  --   MONOABS 0.6  --   EOSABS 0.1  --   BASOSABS 0.1  --     HEMOGLOBIN A1C Lab Results  Component Value Date   HGBA1C 6.1 05/29/2015   MPG 157 12/03/2014    Cardiac Panel (last 3 results)  Recent Labs  05/29/15 2100 05/30/15 0233 05/30/15 0818  TROPONINI 0.06* 0.05* 0.04*    Recent Labs  12/03/14 1635 05/30/15 0233  TSH 3.182 2.979    CHOLESTEROL  Recent Labs  12/03/14 0545 05/30/15 0233  CHOL 243* 135    Hepatic Function Panel  Recent Labs  12/27/14 1247  PROT 7.2  ALBUMIN 3.7  AST 17  ALT 16  ALKPHOS 73  BILITOT 0.5   Cr. Cl is 55mL excluding weight and if weight is taken into consideration, it is 45. Will qualify for Eliquis 5 mg BID or Savaysa 30 mg daily.  EKG 05/29/2015:s Sinus bradycardia at rate of 54 bpm, baseline artifact, PAC. Left axis deviation, left anterior fascicular block.  Scheduled Meds: . albuterol  2.5 mg Nebulization Q6H  . amLODipine  10 mg Oral  Daily  . aspirin  325 mg Oral Daily  . atorvastatin  80 mg Oral q1800  . Chlorhexidine Gluconate Cloth  6 each Topical Q0600  . furosemide  60 mg Intravenous Q12H  . heparin  5,000 Units Subcutaneous 3 times per day  . insulin aspart  0-9 Units Subcutaneous TID WC  . insulin glargine  25 Units Subcutaneous Daily  . isosorbide-hydrALAZINE  2 tablet Oral TID  . labetalol  200 mg Oral TID  . mupirocin ointment  1 application Nasal BID  . sodium chloride  3 mL Intravenous Q12H   Continuous Infusions:  PRN Meds:.sodium chloride, acetaminophen, guaiFENesin-codeine, hydrALAZINE,  ondansetron (ZOFRAN) IV, sodium chloride   Assessment/Plan:  1.  Acute on chronic systolic and diastolic right ventricular heart failure, acute on chronic cor pulmonale due to exacerbation in COPD and obesity hypoventilation. 2.  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 3.  Hypertension 4.  Obstructive sleep apnea and obesity hypoventilation, has history of claustrophobia, does not use CPAP regularly. 5.  Morbid obesity 6.  Hyperlipidemia. 7. History of stroke. 01/13/2014 01/13/2014: CT scan is positive for subacute left lentiform and right caudate nucleus infarct. Carotid duplex normal. No residual deficits  Recommendation: Patient is on appropriate medical therapy, continue IV diuresis.  His main issues are bronchospasm related to heart failure and also underlying COPD, morbid obesity and hypoventilation.  Long-term prognosis is extremely guarded.  I have discussed this with the patient.  No other specific recommendations for now, after review of the EKG, I do not see any evidence of atrial flutter.  The atrial flutter that was previously noted is clearly due to baseline artifact.  Hence does not need anticoagulation.  However he does have risk factors for development of atrial fibrillation, if indeed atrial flutter which appears to be unlikely, or atrial fibrillation were to occur, would recommend Eliquis 5 mg by mouth twice a day or savaysa 30 mg daily.  Adrian Prows, M.D. 05/31/2015, 8:45 AM [facility] Pager: [phone] Office: [phone] If no answer: [phone]

## 2015-06-01 LAB — GLUCOSE, CAPILLARY
GLUCOSE-CAPILLARY: 77 mg/dL (ref 65–99)
Glucose-Capillary: 97 mg/dL (ref 65–99)
Glucose-Capillary: 94 mg/dL (ref 65–99)
Glucose-Capillary: 84 mg/dL (ref 65–99)

## 2015-06-01 LAB — BASIC METABOLIC PANEL
Anion gap: 8 (ref 5–15)
BUN: 31 mg/dL — AB (ref 6–20)
CALCIUM: 8.1 mg/dL — AB (ref 8.9–10.3)
CHLORIDE: 108 mmol/L (ref 101–111)
CO2: 25 mmol/L (ref 22–32)
CREATININE: 3.86 mg/dL — AB (ref 0.61–1.24)
GFR calc Af Amer: 18 mL/min — ABNORMAL LOW (ref >60)
GFR, EST NON AFRICAN AMERICAN: 15 mL/min — AB (ref >60)
GLUCOSE: 85 mg/dL (ref 65–99)
POTASSIUM: 3.2 mmol/L — AB (ref 3.5–5.1)
SODIUM: 141 mmol/L (ref 135–145)

## 2015-06-01 MED ORDER — ALBUTEROL SULFATE (2.5 MG/3ML) 0.083% IN NEBU
INHALATION_SOLUTION | RESPIRATORY_TRACT | Status: AC
  Filled 2015-06-01: qty 3

## 2015-06-01 MED ORDER — POTASSIUM CHLORIDE CRYS ER 20 MEQ PO TBCR
40.0000 meq | EXTENDED_RELEASE_TABLET | Freq: Once | ORAL | Status: AC
  Administered 2015-06-01: 40 meq via ORAL
  Filled 2015-06-01: qty 2

## 2015-06-01 NOTE — Progress Notes (Signed)
RN into check on patient and patient in bathroom.  Patient sitting on the side of the bed after voiding in toilet.  Per patient he wakes up and when he has to go, he has to go quickly and all he can think about is getting into that bathroom and on to the toilet and just forgets about the urinal.

## 2015-06-01 NOTE — Plan of Care (Signed)
Problem: Activity: Goal: Capacity to carry out activities will improve Outcome: Progressing RN has noted patient ambulating to and from the bathroom multiple times this shift.  Shortness of breath noted with this activity but patient is able to complete.  Patient was sleeping with BIPAP on and appeared to be resting comfortably, 02 sats in the upper 90's when q4h focused assessment was due, RN did not awake patient to assess.  RN waited until patient woke up on his own to complete focused assessment.

## 2015-06-01 NOTE — Plan of Care (Signed)
Problem: Cardiac: Goal: Ability to achieve and maintain adequate cardiopulmonary perfusion will improve Outcome: Progressing Echo completed this admission and EF has resulted in echo results.

## 2015-06-01 NOTE — Progress Notes (Signed)
RN continuing to discuss urinal use with patient and reasons why RN wants patient to use urinal over voiding in the toilet while patient is sitting on the side of the bed.  Patient stated that when he is awake during the day it is easier for him to use the urinal because he has more warning of when he needs to urinate.  Per patient when he has been sleeping and wakes up and has to urinate he just has to go and can't worry about the urinal.  Patient stated he was going to sit on the side of the bed for a bit longer and RN asked patient since he was awake now if he felt like he needed to urinate again to please use the urinal.  Patient stated understanding.  Urinal and call light within patient's reach.

## 2015-06-01 NOTE — Progress Notes (Signed)
[facility] PROGRESS NOTE  Blake Ina Sr. [id number] DOB: [birth year] DOA: 05/29/2015 PCP: [name], MD   Brief narrative 64 year old morbidly obese male with chronic combined systolic and diastolic CHF,  hypertension, uncontrolled diabetes, stage IV chronic kidney disease, dyslipidemia, OHS, OSA on CPAP, presented with acute hypoxic respiratory failure secondary to acute on chronic combined CHF.   Assessment/Plan: Acute hypoxic respiratory failure   secondary to acute on chronic  systolic and diastolic CHF. Continue aggressive diuresis with IV Lasix . Strict I/O and daily weight. ( has lost almost 6 kg since admission) Continue beta blocker and BiDil. Holding ARB due to worsening creatinine.  -Appreciate cardiology evaluation and recommendation. -Nutritionist consulted. Patient non-adherent to diet regimen.   Acute on chronic kidney disease stage IV Baseline creatinine of 3.2. Currently on aggressive diuresis. Holding ARB. Follows with [name].  Type 2 diabetes mellitus Stable on Lantus and sliding scale coverage.  OSA/OHS Continue nighttime CPAP.  ? A flutter Appears to be an artifact. No need for anticoagulation as per cardiologist.  History of prolactinoma Continue Cabergoline, stable TSH  Hypokalemia Replenished.  Full-thickness right lower leg wound Appreciate wound care evaluation. Recommend Xeroform gauze to promote healing.  Morbid obesity  DVT prophylaxis: Subcutaneous heparin Diet: Heart healthy/diabetic   Code Status: Full code     family Communication: None at bedside Disposition/plan: Home once diuresed adequately. (Possibly in the next 2-3 days)   Consultants: Cardiology (Dr. Einar Gip) \ Procedures:  None   Antibiotics:  none   HPI/Subjective: Patient seen and examined. Reports his breathing to be better. Was on BiPAP during my evaluation.  Objective: Filed Vitals:   06/01/15 0939 06/01/15 1140  BP: 160/89 131/57   Pulse: 56 54  Temp:    Resp:  16    Intake/Output Summary (Last 24 hours) at 06/01/15 1328 Last data filed at 06/01/15 0941  Gross per 24 hour  Intake    900 ml  Output    750 ml  Net    150 ml   Filed Weights   05/30/15 0400 05/31/15 0400 06/01/15 0609  Weight: 169.101 kg (372 lb 12.8 oz) 166.515 kg (367 lb 1.6 oz) 166.334 kg (366 lb 11.2 oz)    Exam:   General: Morbidly obese male not in distress, on BiPAP  Chest: Diminished breath sounds at to body habitus  CVS: Normal S1 and S2, no murmurs  GI: Soft, obese, nondistended, nontender  Musculoskeletal: Warm, pitting edema bilaterally, dressing over rt tibia with ulceration  CNS: Alert and oriented    Data Reviewed: Basic Metabolic Panel:  Recent Labs Lab 05/29/15 1840 05/30/15 0233 05/31/15 0249 06/01/15 0425  NA 140 140 140 141  K 3.1* 3.1* 3.8 3.2*  CL 109 106 108 108  CO2 23 24 23 25   GLUCOSE 107* 101* 203* 85  BUN 27* 27* 31* 31*  CREATININE 3.68* 3.86* 3.89* 3.86*  CALCIUM 7.9* 8.0* 8.2* 8.1*  MG  --  1.7 1.8  --    Liver Function Tests: No results for input(s): AST, ALT, ALKPHOS, BILITOT, PROT, ALBUMIN in the last 168 hours. No results for input(s): LIPASE, AMYLASE in the last 168 hours. No results for input(s): AMMONIA in the last 168 hours. CBC:  Recent Labs Lab 05/29/15 1639 05/29/15 1840  WBC 7.3 6.6  NEUTROABS 5.3  --   HGB 9.7* 8.9*  HCT 30.0* 27.7*  MCV 86.5 87.7  PLT 364 339   Cardiac Enzymes:  Recent Labs Lab 05/29/15 2100 05/30/15 0233 05/30/15 0818  TROPONINI 0.06* 0.05* 0.04*   BNP (last 3 results)  Recent Labs  05/29/15 1840  BNP 536.5*    ProBNP (last 3 results) No results for input(s): PROBNP in the last 8760 hours.  CBG:  Recent Labs Lab 05/31/15 1143 05/31/15 1627 05/31/15 1930 06/01/15 0733 06/01/15 1140  GLUCAP 144* 98 125* 77 97    Recent Results (from the past 240 hour(s))  MRSA PCR Screening     Status: Abnormal   Collection Time:  05/29/15  9:50 PM  Result Value Ref Range Status   MRSA by PCR POSITIVE (A) NEGATIVE Final    Comment:        The GeneXpert MRSA Assay (FDA approved for NASAL specimens only), is one component of a comprehensive MRSA colonization surveillance program. It is not intended to diagnose MRSA infection nor to guide or monitor treatment for MRSA infections. RESULT CALLED TO, READ BACK BY AND VERIFIED WITH: BRITNEY @0217  05/30/15 MKELLY      Studies: Dg Chest Port 1 View  05/30/2015  CLINICAL DATA:  Cough.  Fluid overload. EXAM: PORTABLE CHEST 1 VIEW COMPARISON:  05/29/2015 FINDINGS: Cardiac enlargement with increased pulmonary vascularity. Hazy perihilar and basilar infiltrates suggesting mild edema. Edema pattern is increased since previous study. No blunting of costophrenic angles. No pneumothorax. Mediastinal contours appear intact. IMPRESSION: Cardiac enlargement with pulmonary vascular congestion. Mild but increasing edema since prior study. Electronically Signed   By: Lucienne Capers M.D.   On: 05/30/2015 18:36    Scheduled Meds: . albuterol  2.5 mg Nebulization Q6H  . amLODipine  10 mg Oral Daily  . aspirin  325 mg Oral Daily  . atorvastatin  80 mg Oral q1800  . Chlorhexidine Gluconate Cloth  6 each Topical Q0600  . furosemide  60 mg Intravenous Q12H  . heparin  5,000 Units Subcutaneous 3 times per day  . insulin aspart  0-9 Units Subcutaneous TID WC  . insulin glargine  25 Units Subcutaneous Daily  . isosorbide-hydrALAZINE  2 tablet Oral TID  . labetalol  200 mg Oral TID  . mupirocin ointment  1 application Nasal BID  . sodium chloride  3 mL Intravenous Q12H   Continuous Infusions:     Time spent: 25 minutes    Candies Palm, [place]  [facility] Pager [phone] If 7PM-7AM, please contact night-coverage at [email or web address], password [facility] 06/01/2015, 1:28 PM  LOS: 3 days

## 2015-06-01 NOTE — Consult Note (Addendum)
WOC wound consult note Reason for Consult: Consult requested for RLE.  Pt has a chronic full thickness wound and is followed by the outpatient wound care center for serial debridements. Wound type: Full thickness stasis ulcers Measurement: Affected area is 3X7cm, with patchy areas of healing full thickness wounds in the middle of this location. Wound bed: 50% red, 50% yellow Drainage (amount, consistency, odor) Small amt yellow drainage, no odor Periwound: Pink scar tissue surrounding wound areas with some white macerated skin related to moisture. Dressing procedure/placement/frequency: Xeroform gauze to promote healing.  Pt can resume follow-up with outpatient wound care center after discharge.  Discussed plan of care and he verbalizes understanding. Please re-consult if further assistance is needed.  Thank-you,  Julien Girt MSN, [place], [place], [place], [place]

## 2015-06-02 LAB — BASIC METABOLIC PANEL
Anion gap: 9 (ref 5–15)
BUN: 31 mg/dL — AB (ref 6–20)
CALCIUM: 8.2 mg/dL — AB (ref 8.9–10.3)
CO2: 26 mmol/L (ref 22–32)
CREATININE: 3.74 mg/dL — AB (ref 0.61–1.24)
Chloride: 106 mmol/L (ref 101–111)
GFR calc Af Amer: 18 mL/min — ABNORMAL LOW (ref >60)
GFR, EST NON AFRICAN AMERICAN: 16 mL/min — AB (ref >60)
Glucose, Bld: 75 mg/dL (ref 65–99)
POTASSIUM: 3.5 mmol/L (ref 3.5–5.1)
SODIUM: 141 mmol/L (ref 135–145)

## 2015-06-02 LAB — GLUCOSE, CAPILLARY
GLUCOSE-CAPILLARY: 81 mg/dL (ref 65–99)
GLUCOSE-CAPILLARY: 96 mg/dL (ref 65–99)
GLUCOSE-CAPILLARY: 100 mg/dL — AB (ref 65–99)
GLUCOSE-CAPILLARY: 124 mg/dL — AB (ref 65–99)

## 2015-06-02 MED ORDER — IPRATROPIUM-ALBUTEROL 0.5-2.5 (3) MG/3ML IN SOLN
3.0000 mL | Freq: Four times a day (QID) | RESPIRATORY_TRACT | Status: DC
  Administered 2015-06-03 – 2015-06-04 (×7): 3 mL via RESPIRATORY_TRACT
  Filled 2015-06-02 (×7): qty 3

## 2015-06-02 MED ORDER — IPRATROPIUM-ALBUTEROL 0.5-2.5 (3) MG/3ML IN SOLN: 3.0000 mL | Freq: Four times a day (QID) | RESPIRATORY_TRACT | Status: DC

## 2015-06-02 MED ORDER — MAGNESIUM SULFATE 2 GM/50ML IV SOLN
2.0000 g | Freq: Once | INTRAVENOUS | Status: AC
  Administered 2015-06-02: 2 g via INTRAVENOUS
  Filled 2015-06-02: qty 50

## 2015-06-02 MED ORDER — POTASSIUM CHLORIDE CRYS ER 20 MEQ PO TBCR
40.0000 meq | EXTENDED_RELEASE_TABLET | Freq: Every day | ORAL | Status: DC
  Administered 2015-06-03 – 2015-06-04 (×2): 40 meq via ORAL
  Filled 2015-06-02 (×2): qty 2

## 2015-06-02 MED ORDER — ALBUTEROL SULFATE (2.5 MG/3ML) 0.083% IN NEBU: 2.5000 mg | INHALATION_SOLUTION | Freq: Four times a day (QID) | RESPIRATORY_TRACT | Status: DC | PRN

## 2015-06-02 MED ORDER — POTASSIUM CHLORIDE CRYS ER 20 MEQ PO TBCR
40.0000 meq | EXTENDED_RELEASE_TABLET | Freq: Once | ORAL | Status: AC
  Administered 2015-06-02: 40 meq via ORAL
  Filled 2015-06-02: qty 2

## 2015-06-02 NOTE — Progress Notes (Signed)
Physical Therapy Treatment Patient Details Name: Blake Hertzog Sr. MRN: [id number] DOB: 30-Aug-1950 Today's Date: 06/02/2015    History of Present Illness Pt is a 64 y.o. M admitted w/ SOB and worsening leg edema.  Pt's PMH includes OSA on CPAP, prolactin secreting pituitary edema, stroke, systolic congestive heart failure (EF 40-45%), seizure, depression, CKD-IV.    PT Comments    Pt was able to walk with PT but very SOB and did not take into hall as his bandages on LE's are not covering wounds well.  Will continue PT and expect HHPT follow up as pt is motivated to have assistance with strengthening.  Also interested in having some assist with reducing LE edema.  Follow Up Recommendations  Supervision/Assistance - 24 hour;Home health PT     Equipment Recommendations  None recommended by PT    Recommendations for Other Services Other (comment) (cardiac rehab)     Precautions / Restrictions Precautions Precautions: Fall Restrictions Weight Bearing Restrictions: No    Mobility  Bed Mobility Overal bed mobility: Needs Assistance Bed Mobility: Supine to Sit;Sit to Supine     Supine to sit: Min assist Sit to supine: Min assist      Transfers Overall transfer level: Modified independent Equipment used: Rolling walker (2 wheeled) Transfers: Sit to/from [facility] Sit to Stand: Supervision Stand pivot transfers: Supervision          Ambulation/Gait Ambulation/Gait assistance: Supervision Ambulation Distance (Feet): 120 Feet (in three trips) Assistive device: Rolling walker (2 wheeled) Gait Pattern/deviations: Step-through pattern;Decreased dorsiflexion - right;Decreased dorsiflexion - left;Shuffle;Wide base of support;Trunk flexed Gait velocity: reduced Gait velocity interpretation: Below normal speed for age/gender General Gait Details: O2 sats were 100% throughout the trials of walking.  Pulse was slightly elevated but acceptable   Stairs             Wheelchair Mobility    Modified Rankin (Stroke Patients Only)       Balance     Sitting balance-Leahy Scale: Good       Standing balance-Leahy Scale: Fair                      Cognition Arousal/Alertness: Awake/alert Behavior During Therapy: WFL for tasks assessed/performed Overall Cognitive Status: No family/caregiver present to determine baseline cognitive functioning                      Exercises General Exercises - Lower Extremity Ankle Circles/Pumps: AROM;Both;10 reps Quad Sets: AROM;Both;10 reps Gluteal Sets: AROM;Both;10 reps Heel Slides: AROM;Both;10 reps Hip ABduction/ADduction: AROM;Both;10 reps Straight Leg Raises: AROM;Both;10 reps Other Exercises Other Exercises: Pt is on contact precautions and used care as his bandages are loose on LE's    General Comments General comments (skin integrity, edema, etc.): Has reduced tolerance for gait given that SOB is part of walking and pulses are elevated with the effort      Pertinent Vitals/Pain Pain Assessment: No/denies pain    Home Living                      Prior Function            PT Goals (current goals can now be found in the care plan section)      Frequency  Min 2X/week    PT Plan      Co-evaluation             End of Session   Activity Tolerance: Patient tolerated  treatment well Patient left: in bed;with call bell/phone within reach;with bed alarm set     Time: [id number] PT Time Calculation (min) (ACUTE ONLY): 35 min  Charges:  $Gait Training: 8-22 mins $Therapeutic Exercise: 8-22 mins                    G Codes:      Ramond Dial 2015-06-21, 4:35 PM   Mee Hives, PT MS Acute Rehab Dept. Number: [facility] [id number] and [place] [phone]

## 2015-06-02 NOTE — Care Management Important Message (Signed)
Important Message  Patient Details  Name: Blake Pfenning Sr. MRN: [id number] Date of Birth: 11/07/1950   Medicare Important Message Given:  Yes    Bethena Roys, RN 06/02/2015, 11:04 AM

## 2015-06-02 NOTE — Progress Notes (Signed)
[facility] PROGRESS NOTE  Blake Ina Sr. [id number] DOB: 1951-03-26 DOA: 05/29/2015 PCP: [name], MD   Brief narrative 64 year old morbidly obese male with chronic combined systolic and diastolic CHF,  hypertension, uncontrolled diabetes, stage IV chronic kidney disease, dyslipidemia, OHS, OSA on CPAP, presented with acute hypoxic respiratory failure secondary to acute on chronic combined CHF.   Assessment/Plan: Acute hypoxic respiratory failure   secondary to acute on chronic  systolic and diastolic CHF. Continue aggressive diuresis with IV Lasix . Strict I/O and daily weight. ( has lost almost 8 kg since admission) Continue beta blocker and BiDil. Holding ARB due to worsening creatinine.  -Appreciate cardiology evaluation and recommendation. -Nutritionist consulted. Patient non-adherent to diet regimen.  -tolerating nighttime BiPAP  Acute on chronic kidney disease stage IV Baseline creatinine of 3.2. Currently on aggressive diuresis. Holding ARB. creatinine slowly improving. Follows with [name].  Type 2 diabetes mellitus Stable on Lantus and sliding scale coverage.  OSA/OHS Continue nighttime CPAP.  ? A flutter Appears to be an artifact. No need for anticoagulation as per cardiologist. Repeat EKG done this morning shows sinus rhythm.   History of prolactinoma Continue Cabergoline, stable TSH  Hypokalemia Replenished.  Full-thickness right lower leg wound Appreciate wound care evaluation. Recommend Xeroform gauze to promote healing.  Morbid obesity  DVT prophylaxis: Subcutaneous heparin Diet: Heart healthy/diabetic   Code Status: Full code     family Communication: None at bedside Disposition/plan: Home once diuresed adequately. (Possibly in the next 48 hours)   Consultants: Cardiology (Dr. Einar Gip)  Procedures:  None   Antibiotics:  none   HPI/Subjective: Patient seen and examined. Reports breathing to be better.    Objective: Filed Vitals:   06/02/15 0800 06/02/15 0957  BP: 137/64 131/49  Pulse: 66 67  Temp: 98.4 F (36.9 C) 98 F (36.7 C)  Resp: 16     Intake/Output Summary (Last 24 hours) at 06/02/15 1123 Last data filed at 06/02/15 0900  Gross per 24 hour  Intake    960 ml  Output   1225 ml  Net   -265 ml   Filed Weights   05/31/15 0400 06/01/15 0609 06/02/15 0500  Weight: 166.515 kg (367 lb 1.6 oz) 166.334 kg (366 lb 11.2 oz) 164.474 kg (362 lb 9.6 oz)    Exam:   General: Morbidly obese male not in distress,  Chest: diffuse rhonchi b/l  CVS: Normal S1 and S2, no murmurs  GI: Soft, obese, nondistended, nontender  Musculoskeletal: Warm, pitting edema bilaterally ( improved), dressing over rt tibia with ulceration  CNS: Alert and oriented    Data Reviewed: Basic Metabolic Panel:  Recent Labs Lab 05/29/15 1840 05/30/15 0233 05/31/15 0249 06/01/15 0425 06/02/15 0420  NA 140 140 140 141 141  K 3.1* 3.1* 3.8 3.2* 3.5  CL 109 106 108 108 106  CO2 23 24 23 25 26   GLUCOSE 107* 101* 203* 85 75  BUN 27* 27* 31* 31* 31*  CREATININE 3.68* 3.86* 3.89* 3.86* 3.74*  CALCIUM 7.9* 8.0* 8.2* 8.1* 8.2*  MG  --  1.7 1.8  --   --    Liver Function Tests: No results for input(s): AST, ALT, ALKPHOS, BILITOT, PROT, ALBUMIN in the last 168 hours. No results for input(s): LIPASE, AMYLASE in the last 168 hours. No results for input(s): AMMONIA in the last 168 hours. CBC:  Recent Labs Lab 05/29/15 1639 05/29/15 1840  WBC 7.3 6.6  NEUTROABS 5.3  --   HGB 9.7* 8.9*  HCT 30.0* 27.7*  MCV 86.5 87.7  PLT 364 339   Cardiac Enzymes:  Recent Labs Lab 05/29/15 2100 05/30/15 0233 05/30/15 0818  TROPONINI 0.06* 0.05* 0.04*   BNP (last 3 results)  Recent Labs  05/29/15 1840  BNP 536.5*    ProBNP (last 3 results) No results for input(s): PROBNP in the last 8760 hours.  CBG:  Recent Labs Lab 06/01/15 1140 06/01/15 1617 06/01/15 2054 06/02/15 0728  06/02/15 1101  GLUCAP 97 94 84 81 96    Recent Results (from the past 240 hour(s))  MRSA PCR Screening     Status: Abnormal   Collection Time: 05/29/15  9:50 PM  Result Value Ref Range Status   MRSA by PCR POSITIVE (A) NEGATIVE Final    Comment:        The GeneXpert MRSA Assay (FDA approved for NASAL specimens only), is one component of a comprehensive MRSA colonization surveillance program. It is not intended to diagnose MRSA infection nor to guide or monitor treatment for MRSA infections. RESULT CALLED TO, READ BACK BY AND VERIFIED WITH: BRITNEY @0217  05/30/15 MKELLY      Studies: No results found.  Scheduled Meds: . albuterol  2.5 mg Nebulization Q6H  . amLODipine  10 mg Oral Daily  . aspirin  325 mg Oral Daily  . atorvastatin  80 mg Oral q1800  . Chlorhexidine Gluconate Cloth  6 each Topical Q0600  . furosemide  60 mg Intravenous Q12H  . heparin  5,000 Units Subcutaneous 3 times per day  . insulin aspart  0-9 Units Subcutaneous TID WC  . insulin glargine  25 Units Subcutaneous Daily  . isosorbide-hydrALAZINE  2 tablet Oral TID  . labetalol  200 mg Oral TID  . mupirocin ointment  1 application Nasal BID  . sodium chloride  3 mL Intravenous Q12H   Continuous Infusions:     Time spent: 25 minutes    [name], [place]  [facility] Pager [phone] If 7PM-7AM, please contact night-coverage at [email or web address], password [facility] 06/02/2015, 11:23 AM  LOS: 4 days

## 2015-06-03 LAB — BASIC METABOLIC PANEL
Anion gap: 9 (ref 5–15)
BUN: 29 mg/dL — AB (ref 6–20)
CALCIUM: 8.4 mg/dL — AB (ref 8.9–10.3)
CHLORIDE: 107 mmol/L (ref 101–111)
CO2: 25 mmol/L (ref 22–32)
CREATININE: 3.51 mg/dL — AB (ref 0.61–1.24)
GFR calc non Af Amer: 17 mL/min — ABNORMAL LOW (ref >60)
GFR, EST AFRICAN AMERICAN: 20 mL/min — AB (ref >60)
Glucose, Bld: 96 mg/dL (ref 65–99)
Potassium: 3.8 mmol/L (ref 3.5–5.1)
SODIUM: 141 mmol/L (ref 135–145)

## 2015-06-03 LAB — GLUCOSE, CAPILLARY
GLUCOSE-CAPILLARY: 115 mg/dL — AB (ref 65–99)
Glucose-Capillary: 85 mg/dL (ref 65–99)
Glucose-Capillary: 105 mg/dL — ABNORMAL HIGH (ref 65–99)

## 2015-06-03 MED ORDER — POTASSIUM CHLORIDE CRYS ER 20 MEQ PO TBCR: 40.0000 meq | EXTENDED_RELEASE_TABLET | Freq: Once | ORAL | Status: DC

## 2015-06-03 NOTE — Progress Notes (Signed)
[facility] PROGRESS NOTE  Blake Ina Sr. [id number] DOB: Dec 03, 1950 DOA: 05/29/2015 PCP: No primary care provider on file.   Brief narrative 64 year old morbidly obese male with chronic combined systolic and diastolic CHF,  hypertension, uncontrolled diabetes, stage IV chronic kidney disease, dyslipidemia, OHS, OSA on CPAP, presented with acute hypoxic respiratory failure secondary to acute on chronic combined CHF.   Assessment/Plan: Acute hypoxic respiratory failure   secondary to acute on chronic  systolic and diastolic CHF. Continue aggressive diuresis with IV Lasix . Strict I/O and daily weight. ( has lost almost 10 kg since admission) Continue beta blocker and BiDil. Holding ARB due to worsening creatinine.  -Appreciate cardiology evaluation and recommendation. -Nutritionist consulted. Patient non-adherent to diet regimen.  -tolerating nighttime BiPAP  Acute on chronic kidney disease stage IV Baseline creatinine of 3.2. Currently on aggressive diuresis. Holding ARB. creatinine slowly improving. Follows with [name].  Type 2 diabetes mellitus Stable on Lantus and sliding scale coverage.  OSA/OHS Continue nighttime CPAP.  ? A flutter Appears to be an artifact. No need for anticoagulation as per cardiologist. Repeat EKG done this morning shows sinus rhythm.   History of prolactinoma Continue Cabergoline, stable TSH  Hypokalemia Replenished.  Full-thickness right lower leg wound Appreciate wound care evaluation. Recommend Xeroform gauze to promote healing.  Morbid obesity  DVT prophylaxis: Subcutaneous heparin Diet: Heart healthy/diabetic   Code Status: Full code     family Communication: None at bedside Disposition/plan: Home possibly on 12/19 if diuresing progressively   Consultants: Cardiology (Dr. Einar Gip)  Procedures:  None   Antibiotics:  none   HPI/Subjective: Patient seen and examined. Denies any specific symptoms. Shortness  of breath continues to be better.  Objective: Filed Vitals:   06/03/15 0836 06/03/15 1139  BP: 157/77 138/73  Pulse: 73 80  Temp: 98.6 F (37 C) 98.1 F (36.7 C)  Resp: 20 20    Intake/Output Summary (Last 24 hours) at 06/03/15 1419 Last data filed at 06/03/15 [id number]  Gross per 24 hour  Intake    720 ml  Output      0 ml  Net    720 ml   Filed Weights   06/01/15 0609 06/02/15 0500 06/03/15 0500  Weight: 166.334 kg (366 lb 11.2 oz) 164.474 kg (362 lb 9.6 oz) 162.796 kg (358 lb 14.4 oz)    Exam:   General: Morbidly obese male not in distress,  Chest: Diminished breath bilaterally due to body habitus.  CVS: Normal S1 and S2, no murmurs  GI: Soft, obese, nondistended, nontender  Musculoskeletal: Warm, pitting edema bilaterally ( improving), dressing over rt tibia with ulceration  CNS: Alert and oriented    Data Reviewed: Basic Metabolic Panel:  Recent Labs Lab 05/30/15 0233 05/31/15 0249 06/01/15 0425 06/02/15 0420 06/03/15 0347  NA 140 140 141 141 141  K 3.1* 3.8 3.2* 3.5 3.8  CL 106 108 108 106 107  CO2 24 23 25 26 25   GLUCOSE 101* 203* 85 75 96  BUN 27* 31* 31* 31* 29*  CREATININE 3.86* 3.89* 3.86* 3.74* 3.51*  CALCIUM 8.0* 8.2* 8.1* 8.2* 8.4*  MG 1.7 1.8  --   --   --    Liver Function Tests: No results for input(s): AST, ALT, ALKPHOS, BILITOT, PROT, ALBUMIN in the last 168 hours. No results for input(s): LIPASE, AMYLASE in the last 168 hours. No results for input(s): AMMONIA in the last 168 hours. CBC:  Recent Labs Lab 05/29/15 1639 05/29/15 1840  WBC 7.3 6.6  NEUTROABS 5.3  --   HGB 9.7* 8.9*  HCT 30.0* 27.7*  MCV 86.5 87.7  PLT 364 339   Cardiac Enzymes:  Recent Labs Lab 05/29/15 2100 05/30/15 0233 05/30/15 0818  TROPONINI 0.06* 0.05* 0.04*   BNP (last 3 results)  Recent Labs  05/29/15 1840  BNP 536.5*    ProBNP (last 3 results) No results for input(s): PROBNP in the last 8760 hours.  CBG:  Recent Labs Lab  06/02/15 1101 06/02/15 1640 06/02/15 2131 06/03/15 0736 06/03/15 1136  GLUCAP 96 100* 124* 85 115*    Recent Results (from the past 240 hour(s))  MRSA PCR Screening     Status: Abnormal   Collection Time: 05/29/15  9:50 PM  Result Value Ref Range Status   MRSA by PCR POSITIVE (A) NEGATIVE Final    Comment:        The GeneXpert MRSA Assay (FDA approved for NASAL specimens only), is one component of a comprehensive MRSA colonization surveillance program. It is not intended to diagnose MRSA infection nor to guide or monitor treatment for MRSA infections. RESULT CALLED TO, READ BACK BY AND VERIFIED WITH: BRITNEY @0217  05/30/15 MKELLY      Studies: No results found.  Scheduled Meds: . amLODipine  10 mg Oral Daily  . aspirin  325 mg Oral Daily  . atorvastatin  80 mg Oral q1800  . furosemide  60 mg Intravenous Q12H  . heparin  5,000 Units Subcutaneous 3 times per day  . insulin aspart  0-9 Units Subcutaneous TID WC  . insulin glargine  25 Units Subcutaneous Daily  . ipratropium-albuterol  3 mL Nebulization Q6H  . isosorbide-hydrALAZINE  2 tablet Oral TID  . labetalol  200 mg Oral TID  . potassium chloride  40 mEq Oral Daily  . sodium chloride  3 mL Intravenous Q12H   Continuous Infusions:     Time spent: 25 minutes    Blake Bell, [place]  [facility] Pager [phone] If 7PM-7AM, please contact night-coverage at [email or web address], password [facility] 06/03/2015, 2:19 PM  LOS: 5 days

## 2015-06-04 DIAGNOSIS — D509 Iron deficiency anemia, unspecified: Secondary | ICD-10-CM | POA: Diagnosis present

## 2015-06-04 DIAGNOSIS — I5033 Acute on chronic diastolic (congestive) heart failure: Principal | ICD-10-CM

## 2015-06-04 DIAGNOSIS — E785 Hyperlipidemia, unspecified: Secondary | ICD-10-CM

## 2015-06-04 LAB — BASIC METABOLIC PANEL
ANION GAP: 8 (ref 5–15)
BUN: 27 mg/dL — AB (ref 6–20)
CALCIUM: 8.4 mg/dL — AB (ref 8.9–10.3)
CO2: 28 mmol/L (ref 22–32)
CREATININE: 3.61 mg/dL — AB (ref 0.61–1.24)
Chloride: 104 mmol/L (ref 101–111)
GFR calc Af Amer: 19 mL/min — ABNORMAL LOW (ref >60)
GFR, EST NON AFRICAN AMERICAN: 16 mL/min — AB (ref >60)
GLUCOSE: 113 mg/dL — AB (ref 65–99)
Potassium: 3.8 mmol/L (ref 3.5–5.1)
Sodium: 140 mmol/L (ref 135–145)

## 2015-06-04 LAB — GLUCOSE, CAPILLARY
Glucose-Capillary: 90 mg/dL (ref 65–99)
Glucose-Capillary: 84 mg/dL (ref 65–99)
Glucose-Capillary: 131 mg/dL — ABNORMAL HIGH (ref 65–99)

## 2015-06-04 LAB — IRON AND TIBC
IRON: 10 ug/dL — AB (ref 45–182)
Saturation Ratios: 5 % — ABNORMAL LOW (ref 17.9–39.5)
TIBC: 197 ug/dL — ABNORMAL LOW (ref 250–450)
UIBC: 187 ug/dL

## 2015-06-04 LAB — CBC
HCT: 26.6 % — ABNORMAL LOW (ref 39.0–52.0)
HEMOGLOBIN: 8.7 g/dL — AB (ref 13.0–17.0)
MCH: 28.3 pg (ref 26.0–34.0)
MCHC: 32.7 g/dL (ref 30.0–36.0)
MCV: 86.6 fL (ref 78.0–100.0)
PLATELETS: 392 10*3/uL (ref 150–400)
RBC: 3.07 MIL/uL — ABNORMAL LOW (ref 4.22–5.81)
RDW: 17.5 % — AB (ref 11.5–15.5)
WBC: 12 10*3/uL — ABNORMAL HIGH (ref 4.0–10.5)

## 2015-06-04 LAB — MAGNESIUM: MAGNESIUM: 1.9 mg/dL (ref 1.7–2.4)

## 2015-06-04 MED ORDER — INSULIN GLARGINE 100 UNIT/ML ~~LOC~~ SOLN: 25.0000 [IU] | Freq: Every day | SUBCUTANEOUS | Status: DC

## 2015-06-04 MED ORDER — INSULIN GLARGINE 100 UNIT/ML ~~LOC~~ SOLN: 20.0000 [IU] | Freq: Every day | SUBCUTANEOUS | Status: DC

## 2015-06-04 MED ORDER — FERROUS SULFATE 325 (65 FE) MG PO TABS: 325.0000 mg | ORAL_TABLET | Freq: Two times a day (BID) | ORAL | Status: DC

## 2015-06-04 MED ORDER — FUROSEMIDE 80 MG PO TABS: 80.0000 mg | ORAL_TABLET | Freq: Two times a day (BID) | ORAL | Status: DC

## 2015-06-04 MED ORDER — POTASSIUM CHLORIDE CRYS ER 20 MEQ PO TBCR: 40.0000 meq | EXTENDED_RELEASE_TABLET | Freq: Every day | ORAL | Status: DC

## 2015-06-04 NOTE — Discharge Instructions (Signed)
Heart Failure °Heart failure is a condition in which the heart has trouble pumping blood. This means your heart does not pump blood efficiently for your body to work well. In some cases of heart failure, fluid may back up into your lungs or you may have swelling (edema) in your lower legs. Heart failure is usually a long-term (chronic) condition. It is important for you to take good care of yourself and follow your health care provider's treatment plan. °CAUSES  °Some health conditions can cause heart failure. Those health conditions include: °· High blood pressure (hypertension). Hypertension causes the heart muscle to work harder than normal. When pressure in the blood vessels is high, the heart needs to pump (contract) with more force in order to circulate blood throughout the body. High blood pressure eventually causes the heart to become stiff and weak. °· Coronary artery disease (CAD). CAD is the buildup of cholesterol and fat (plaque) in the arteries of the heart. The blockage in the arteries deprives the heart muscle of oxygen and blood. This can cause chest pain and may lead to a heart attack. High blood pressure can also contribute to CAD. °· Heart attack (myocardial infarction). A heart attack occurs when one or more arteries in the heart become blocked. The loss of oxygen damages the muscle tissue of the heart. When this happens, part of the heart muscle dies. The injured tissue does not contract as well and weakens the heart's ability to pump blood. °· Abnormal heart valves. When the heart valves do not open and close properly, it can cause heart failure. This makes the heart muscle pump harder to keep the blood flowing. °· Heart muscle disease (cardiomyopathy or myocarditis). Heart muscle disease is damage to the heart muscle from a variety of causes. These can include drug or alcohol abuse, infections, or unknown reasons. These can increase the risk of heart failure. °· Lung disease. Lung disease  makes the heart work harder because the lungs do not work properly. This can cause a strain on the heart, leading it to fail. °· Diabetes. Diabetes increases the risk of heart failure. High blood sugar contributes to high fat (lipid) levels in the blood. Diabetes can also cause slow damage to tiny blood vessels that carry important nutrients to the heart muscle. When the heart does not get enough oxygen and food, it can cause the heart to become weak and stiff. This leads to a heart that does not contract efficiently. °· Other conditions can contribute to heart failure. These include abnormal heart rhythms, thyroid problems, and low blood counts (anemia). °Certain unhealthy behaviors can increase the risk of heart failure, including: °· Being overweight. °· Smoking or chewing tobacco. °· Eating foods high in fat and cholesterol. °· Abusing illicit drugs or alcohol. °· Lacking physical activity. °SYMPTOMS  °Heart failure symptoms may vary and can be hard to detect. Symptoms may include: °· Shortness of breath with activity, such as climbing stairs. °· Persistent cough. °· Swelling of the feet, ankles, legs, or abdomen. °· Unexplained weight gain. °· Difficulty breathing when lying flat (orthopnea). °· Waking from sleep because of the need to sit up and get more air. °· Rapid heartbeat. °· Fatigue and loss of energy. °· Feeling light-headed, dizzy, or close to fainting. °· Loss of appetite. °· Nausea. °· Increased urination during the night (nocturia). °DIAGNOSIS  °A diagnosis of heart failure is based on your history, symptoms, physical examination, and diagnostic tests. Diagnostic tests for heart failure may include: °·   Echocardiography. °· Electrocardiography. °· Chest X-ray. °· Blood tests. °· Exercise stress test. °· Cardiac angiography. °· Radionuclide scans. °TREATMENT  °Treatment is aimed at managing the symptoms of heart failure. Medicines, behavioral changes, or surgical intervention may be necessary to  treat heart failure. °· Medicines to help treat heart failure may include: °¨ Angiotensin-converting enzyme (ACE) inhibitors. This type of medicine blocks the effects of a blood protein called angiotensin-converting enzyme. ACE inhibitors relax (dilate) the blood vessels and help lower blood pressure. °¨ Angiotensin receptor blockers (ARBs). This type of medicine blocks the actions of a blood protein called angiotensin. Angiotensin receptor blockers dilate the blood vessels and help lower blood pressure. °¨ Water pills (diuretics). Diuretics cause the kidneys to remove salt and water from the blood. The extra fluid is removed through urination. This loss of extra fluid lowers the volume of blood the heart pumps. °¨ Beta blockers. These prevent the heart from beating too fast and improve heart muscle strength. °¨ Digitalis. This increases the force of the heartbeat. °· Healthy behavior changes include: °¨ Obtaining and maintaining a healthy weight. °¨ Stopping smoking or chewing tobacco. °¨ Eating heart-healthy foods. °¨ Limiting or avoiding alcohol. °¨ Stopping illicit drug use. °¨ Physical activity as directed by your health care provider. °· Surgical treatment for heart failure may include: °¨ A procedure to open blocked arteries, repair damaged heart valves, or remove damaged heart muscle tissue. °¨ A pacemaker to improve heart muscle function and control certain abnormal heart rhythms. °¨ An internal cardioverter defibrillator to treat certain serious abnormal heart rhythms. °¨ A left ventricular assist device (LVAD) to assist the pumping ability of the heart. °HOME CARE INSTRUCTIONS  °· Take medicines only as directed by your health care provider. Medicines are important in reducing the workload of your heart, slowing the progression of heart failure, and improving your symptoms. °¨ Do not stop taking your medicine unless directed by your health care provider. °¨ Do not skip any dose of medicine. °¨ Refill your  prescriptions before you run out of medicine. Your medicines are needed every day. °· Engage in moderate physical activity if directed by your health care provider. Moderate physical activity can benefit some people. The elderly and people with severe heart failure should consult with a health care provider for physical activity recommendations. °· Eat heart-healthy foods. Food choices should be free of trans fat and low in saturated fat, cholesterol, and salt (sodium). Healthy choices include fresh or frozen fruits and vegetables, fish, lean meats, legumes, fat-free or low-fat dairy products, and whole grain or high fiber foods. Talk to a dietitian to learn more about heart-healthy foods. °· Limit sodium if directed by your health care provider. Sodium restriction may reduce symptoms of heart failure in some people. Talk to a dietitian to learn more about heart-healthy seasonings. °· Use healthy cooking methods. Healthy cooking methods include roasting, grilling, broiling, baking, poaching, steaming, or stir-frying. Talk to a dietitian to learn more about healthy cooking methods. °· Limit fluids if directed by your health care provider. Fluid restriction may reduce symptoms of heart failure in some people. °· Weigh yourself every day. Daily weights are important in the early recognition of excess fluid. You should weigh yourself every morning after you urinate and before you eat breakfast. Wear the same amount of clothing each time you weigh yourself. Record your daily weight. Provide your health care provider with your weight record. °· Monitor and record your blood pressure if directed by your health care   provider.  Check your pulse if directed by your health care provider.  Lose weight if directed by your health care provider. Weight loss may reduce symptoms of heart failure in some people.  Stop smoking or chewing tobacco. Nicotine makes your heart work harder by causing your blood vessels to constrict.  Do not use nicotine gum or patches before talking to your health care provider.  Keep all follow-up visits as directed by your health care provider. This is important.  Limit alcohol intake to no more than 1 drink per day for nonpregnant women and 2 drinks per day for men. One drink equals 12 ounces of beer, 5 ounces of wine, or 1 ounces of hard liquor. Drinking more than that is harmful to your heart. Tell your health care provider if you drink alcohol several times a week. Talk with your health care provider about whether alcohol is safe for you. If your heart has already been damaged by alcohol or you have severe heart failure, drinking alcohol should be stopped completely.  Stop illicit drug use.  Stay up-to-date with immunizations. It is especially important to prevent respiratory infections through current pneumococcal and influenza immunizations.  Manage other health conditions such as hypertension, diabetes, thyroid disease, or abnormal heart rhythms as directed by your health care provider.  Learn to manage stress.  Plan rest periods when fatigued.  Learn strategies to manage high temperatures. If the weather is extremely hot:  Avoid vigorous physical activity.  Use air conditioning or fans or seek a cooler location.  Avoid caffeine and alcohol.  Wear loose-fitting, lightweight, and light-colored clothing.  Learn strategies to manage cold temperatures. If the weather is extremely cold:  Avoid vigorous physical activity.  Layer clothes.  Wear mittens or gloves, a hat, and a scarf when going outside.  Avoid alcohol.  Obtain ongoing education and support as needed.  Participate in or seek rehabilitation as needed to maintain or improve independence and quality of life. SEEK MEDICAL CARE IF:   You have a rapid weight gain.  You have increasing shortness of breath that is unusual for you.  You are unable to participate in your usual physical activities.  You tire  easily.  You cough more than normal, especially with physical activity.  You have any or more swelling in areas such as your hands, feet, ankles, or abdomen.  You are unable to sleep because it is hard to breathe.  You feel like your heart is beating fast (palpitations).  You become dizzy or light-headed upon standing up. SEEK IMMEDIATE MEDICAL CARE IF:   You have difficulty breathing.  There is a change in mental status such as decreased alertness or difficulty with concentration.  You have a pain or discomfort in your chest.  You have an episode of fainting (syncope). MAKE SURE YOU:   Understand these instructions.  Will watch your condition.  Will get help right away if you are not doing well or get worse.   This information is not intended to replace advice given to you by your health care provider. Make sure you discuss any questions you have with your health care provider.   Document Released: 06/03/2005 Document Revised: 10/18/2014 Document Reviewed: 07/03/2012 Elsevier Interactive Patient Education 2016 [place] DASH stands for "Dietary Approaches to Stop Hypertension." The DASH eating plan is a healthy eating plan that has been shown to reduce high blood pressure (hypertension). Additional health benefits may include reducing the risk of type 2 diabetes  mellitus, heart disease, and stroke. The DASH eating plan may also help with weight loss. WHAT DO I NEED TO KNOW ABOUT THE DASH EATING PLAN? For the DASH eating plan, you will follow these general guidelines:  Choose foods with a percent daily value for sodium of less than 5% (as listed on the food label).  Use salt-free seasonings or herbs instead of table salt or sea salt.  Check with your health care provider or pharmacist before using salt substitutes.  Eat lower-sodium products, often labeled as "lower sodium" or "no salt added."  Eat fresh foods.  Eat more vegetables, fruits, and  low-fat dairy products.  Choose whole grains. Look for the word "whole" as the first word in the ingredient list.  Choose fish and skinless chicken or [place] more often than red meat. Limit fish, poultry, and meat to 6 oz (170 g) each day.  Limit sweets, desserts, sugars, and sugary drinks.  Choose heart-healthy fats.  Limit cheese to 1 oz (28 g) per day.  Eat more home-cooked food and less restaurant, buffet, and fast food.  Limit fried foods.  Cook foods using methods other than frying.  Limit canned vegetables. If you do use them, rinse them well to decrease the sodium.  When eating at a restaurant, ask that your food be prepared with less salt, or no salt if possible. WHAT FOODS CAN I EAT? Seek help from a dietitian for individual calorie needs. Grains Whole grain or whole wheat bread. Brown rice. Whole grain or whole wheat pasta. Quinoa, bulgur, and whole grain cereals. Low-sodium cereals. Corn or whole wheat flour tortillas. Whole grain cornbread. Whole grain crackers. Low-sodium crackers. Vegetables Fresh or frozen vegetables (raw, steamed, roasted, or grilled). Low-sodium or reduced-sodium tomato and vegetable juices. Low-sodium or reduced-sodium tomato sauce and paste. Low-sodium or reduced-sodium canned vegetables.  Fruits All fresh, canned (in natural juice), or frozen fruits. Meat and Other Protein Products Ground beef (85% or leaner), grass-fed beef, or beef trimmed of fat. Skinless chicken or [place]. Ground chicken or [place]. Pork trimmed of fat. All fish and seafood. Eggs. Dried beans, peas, or lentils. Unsalted nuts and seeds. Unsalted canned beans. Dairy Low-fat dairy products, such as skim or 1% milk, 2% or reduced-fat cheeses, low-fat ricotta or cottage cheese, or plain low-fat yogurt. Low-sodium or reduced-sodium cheeses. Fats and Oils Tub margarines without trans fats. Light or reduced-fat mayonnaise and salad dressings (reduced sodium). Avocado. Safflower,  olive, or canola oils. Natural peanut or almond butter. Other Unsalted popcorn and pretzels. The items listed above may not be a complete list of recommended foods or beverages. Contact your dietitian for more options. WHAT FOODS ARE NOT RECOMMENDED? Grains White bread. White pasta. White rice. Refined cornbread. Bagels and croissants. Crackers that contain trans fat. Vegetables Creamed or fried vegetables. Vegetables in a cheese sauce. Regular canned vegetables. Regular canned tomato sauce and paste. Regular tomato and vegetable juices. Fruits Dried fruits. Canned fruit in light or heavy syrup. Fruit juice. Meat and Other Protein Products Fatty cuts of meat. Ribs, chicken wings, bacon, sausage, bologna, salami, chitterlings, fatback, hot dogs, bratwurst, and packaged luncheon meats. Salted nuts and seeds. Canned beans with salt. Dairy Whole or 2% milk, cream, half-and-half, and cream cheese. Whole-fat or sweetened yogurt. Full-fat cheeses or blue cheese. Nondairy creamers and whipped toppings. Processed cheese, cheese spreads, or cheese curds. Condiments Onion and garlic salt, seasoned salt, table salt, and sea salt. Canned and packaged gravies. Worcestershire sauce. Tartar sauce. Barbecue sauce. Teriyaki sauce. Soy  sauce, including reduced sodium. Steak sauce. Fish sauce. Oyster sauce. Cocktail sauce. Horseradish. Ketchup and mustard. Meat flavorings and tenderizers. Bouillon cubes. Hot sauce. Tabasco sauce. Marinades. Taco seasonings. Relishes. Fats and Oils Butter, stick margarine, lard, shortening, ghee, and bacon fat. Coconut, palm kernel, or palm oils. Regular salad dressings. Other Pickles and olives. Salted popcorn and pretzels. The items listed above may not be a complete list of foods and beverages to avoid. Contact your dietitian for more information. WHERE CAN I FIND MORE INFORMATION? National Heart, Lung, and Blood Institute: travelstabloid.com     This information is not intended to replace advice given to you by your health care provider. Make sure you discuss any questions you have with your health care provider.   Document Released: 05/23/2011 Document Revised: 06/24/2014 Document Reviewed: 04/07/2013 Elsevier Interactive Patient Education [employer].

## 2015-06-04 NOTE — Plan of Care (Signed)
Problem: Education: Goal: Ability to demonstrate managment of disease process will improve Outcome: Completed/Met Date Met:  06/04/15 Pt has HF book at bedside, Will f/u with pt on any questions or concerns during discharge

## 2015-06-04 NOTE — Progress Notes (Signed)
Pt will be discharged home with Wife, Son to pick up pt. All discharge paperwork complete, and gone over extensively with pt and Son. IV removed. Pt instructed to call [address] [place] with any questions regarding discharge. Pt and family also encouraged to make follow up appointments. Leg dressing changed prior to discharge. All questions answered

## 2015-06-04 NOTE — Discharge Summary (Signed)
Physician Discharge Summary  Blake Bell. [id number] DOB: 19-Jun-1950 DOA: 05/29/2015  PCP: [facility] urgent [facility]  Primary cardiologist: Dr Blake Bell  Admit date: 05/29/2015 Discharge date: 06/04/2015  Time spent: 25 minutes  Recommendations for Outpatient Follow-up:  1. Home with Blake Bell . 2. Follow up with PCP at Blake Bell in 1 week and with cardiologist in 2-3 weeks   Discharge Diagnoses:  Principal Problem:   Acute on chronic diastolic heart failure (HCC)  Active Problems:   OSA (obstructive sleep apnea)   Sellar or suprasellar mass   Acute renal failure superimposed on stage 4 chronic kidney disease (HCC)   Morbid obesity (HCC)   Essential hypertension, benign   HLD (hyperlipidemia)   Type II diabetes mellitus with neurological manifestations ([place])   Uncontrolled diabetes with stage 4 chronic kidney disease GFR 15-29 ([place])   CHF exacerbation (HCC)   H/O: CVA (cerebrovascular accident)   Anemia, iron deficiency   Discharge Condition: fair  Diet recommendation: heart healthy/ diabetic  Weight upon discharge:  161 KG (254 pounds)  [employer]   06/03/15 0500 06/04/15 0405 06/04/15 0908  Weight: 162.796 kg (358 lb 14.4 oz) 134.083 kg (295 lb 9.6 oz) 161 kg (354 lb 15.1 oz)    History of present illness:  64 year old morbidly obese male with chronic combined systolic and diastolic CHF, hypertension, uncontrolled diabetes, stage IV chronic kidney disease, dyslipidemia, OHS, OSA on CPAP, presented with acute hypoxic respiratory failure secondary to acute on chronic combined CHF.  Hospital Course:  Acute hypoxic respiratory failure  secondary to acute on chronic  diastolic CHF.  2-D echo shows normal EF with diastolic dysfunction and increased PA pressure (65 mmHg). Diuresed well with IV Lasix ( has lost almost 11 kg since admission) Continue beta blocker and BiDil. Holding ARB due to worsening creatinine.  -We will discharge him on increased dose of  Lasix (80 mg twice a day). Resume Aldactone. -Appreciate cardiology evaluation and recommendation. -Nutritionist consulted. Patient non-adherent to diet regimen. Counseled on dietary and medication adherence. Provided diet instructions. -tolerating nighttime CPAP. -clinically much improved and can be discharged home with home health Bell. Follow-up as outpatient.  Acute on chronic kidney disease stage IV Baseline creatinine of 3.2. Currently on aggressive diuresis. Holding ARB. creatinine slowly improving. Follows with Dr. Florene Bell. Please monitor renal function as outpatient and is stable may resume ARB.  Type 2 diabetes mellitus  fsg around 80-120. I have reduced his Lantus to 20 units daily.  OSA/OHS Continue nighttime CPAP. Instructed on compliance.  ? A flutter Appears to be an artifact. No need for anticoagulation as per cardiologist. Repeat EKG done this morning shows sinus rhythm.   History of prolactinoma Continue Cabergoline, stable TSH  Hypokalemia Replenished. Added potassium supplement.  Full-thickness right lower leg wound Appreciate wound Bell evaluation. Recommend Xeroform gauze to promote healing.  Morbid obesity Counseled on diet and exercise to lose weight  Diet: Heart healthy/diabetic   Code Status: Full code    family Communication: spoke with son on the phone Disposition/plan: Home with Blake Bell   Consultants: Cardiology (Dr. Einar Bell)  Procedures:  None  Antibiotics:  none  Discharge Exam: Filed Vitals:   06/04/15 0405 06/04/15 0741  BP: 178/93 166/83  Pulse: 70 68  Temp: 98.9 F (37.2 C) 98.2 F (36.8 C)  Resp: 18 18     General: Morbidly obese male not in distress,  HEENT: No pallor, moist oral mucosa, supple neck  Chest: Diminished breath bilaterally due to body habitus.  CVS: Normal S1 and S2, no murmurs  GI: Soft, obese, nondistended, nontender  Musculoskeletal: Warm, pitting edema bilaterally ( improving), dressing over rt  tibia with ulceration  CNS: Alert and oriented  Discharge Instructions    Current Discharge Medication List    START taking these medications   Details  ferrous sulfate 325 (65 FE) MG tablet Take 1 tablet (325 mg total) by mouth 2 (two) times daily with a meal. Qty: 60 tablet, Refills: 0    potassium chloride SA (K-DUR,KLOR-CON) 20 MEQ tablet Take 2 tablets (40 mEq total) by mouth daily. Qty: 30 tablet, Refills: 0      CONTINUE these medications which have CHANGED   Details  furosemide (LASIX) 80 MG tablet Take 1 tablet (80 mg total) by mouth 2 (two) times daily. Qty: 60 tablet, Refills: 0    insulin glargine (LANTUS) 100 UNIT/ML injection Inject 0.2 mLs (20 Units total) into the skin daily. Qty: 10 mL, Refills: 0      CONTINUE these medications which have NOT CHANGED   Details  Albiglutide (TANZEUM) 30 MG PEN Inject 30 mg into the skin once a week. Qty: 1 each, Refills: 2    amLODipine (NORVASC) 10 MG tablet Take 1 tablet (10 mg total) by mouth daily. Qty: 30 tablet, Refills: 0    aspirin 325 MG tablet Take 325 mg by mouth daily.    atorvastatin (LIPITOR) 80 MG tablet Take 1 tablet (80 mg total) by mouth daily at 6 PM. Qty: 30 tablet, Refills: 0    cabergoline (DOSTINEX) 0.5 MG tablet Take 0.5 tablets (0.25 mg total) by mouth 2 (two) times a week. Qty: 12 tablet, Refills: 0    isosorbide-hydrALAZINE (BIDIL) 20-37.5 MG per tablet Take 2 tablets by mouth 3 (three) times daily. Qty: 180 tablet, Refills: 0    labetalol (NORMODYNE) 200 MG tablet TAKE 1 TABLET 3 TIMES A DAY. Qty: 90 tablet, Refills: 4    spironolactone (ALDACTONE) 25 MG tablet Take 25 mg by mouth daily. Refills: 6    B-D ULTRAFINE III SHORT PEN 31G X 8 MM MISC USE WITH SOLOSTAR AND NOVOLOG Qty: 100 each, Refills: 1    clopidogrel (PLAVIX) 75 MG tablet Take 1 tablet (75 mg total) by mouth daily with breakfast. Qty: 30 tablet, Refills: 5    Syringe, Disposable, 1 ML MISC Insulin syringes as needed  to go with medications - lantus and tanzeum Qty: 200 each, Refills: 11      STOP taking these medications     Azilsartan Medoxomil (EDARBI) 40 MG TABS      escitalopram (LEXAPRO) 20 MG tablet        No Known Allergies Follow-up Information    Follow up with [facility].   Why:  Registered Nurse   Contact information:   [address] [place] [address] [phone]       Follow up with Adrian Prows, MD In 2 weeks.   Specialty:  Cardiology   Contact information:   [address] [place] [address] [phone]       Follow up with [facility]. Schedule an appointment as soon as possible for a visit in 1 week.   Contact information:   [place] [phone]       The results of significant diagnostics from this hospitalization (including imaging, microbiology, ancillary and laboratory) are listed below for reference.    Significant Diagnostic Studies: Dg Chest  2 View  05/29/2015  CLINICAL DATA:  64 year old male with cough, wheezing and lower extremity edema EXAM: CHEST  2 VIEW COMPARISON:  Prior chest x-ray 01/13/2014 FINDINGS: Stable cardiomegaly with left heart enlargement. Mild pulmonary vascular congestion without overt edema. No focal airspace consolidation. Perhaps trace bilateral effusions. No acute osseous abnormality. IMPRESSION: Cardiomegaly with mild pulmonary vascular congestion but no overt pulmonary edema. Trace bilateral pleural effusions. Electronically Signed   By: Jacqulynn Cadet M.D.   On: 05/29/2015 17:57   Dg Chest Port 1 View  05/30/2015  CLINICAL DATA:  Cough.  Fluid overload. EXAM: PORTABLE CHEST 1 VIEW COMPARISON:  05/29/2015 FINDINGS: Cardiac enlargement with increased pulmonary vascularity. Hazy perihilar and basilar infiltrates suggesting mild edema. Edema pattern is increased since previous study. No blunting of costophrenic angles. No  pneumothorax. Mediastinal contours appear intact. IMPRESSION: Cardiac enlargement with pulmonary vascular congestion. Mild but increasing edema since prior study. Electronically Signed   By: Lucienne Capers M.D.   On: 05/30/2015 18:36    Microbiology: Recent Results (from the past 240 hour(s))  MRSA PCR Screening     Status: Abnormal   Collection Time: 05/29/15  9:50 PM  Result Value Ref Range Status   MRSA by PCR POSITIVE (A) NEGATIVE Final    Comment:        The GeneXpert MRSA Assay (FDA approved for NASAL specimens only), is one component of a comprehensive MRSA colonization surveillance program. It is not intended to diagnose MRSA infection nor to guide or monitor treatment for MRSA infections. RESULT CALLED TO, READ BACK BY AND VERIFIED WITH: BRITNEY @0217  05/30/15 MKELLY      Labs: Basic Metabolic Panel:  Recent Labs Lab 05/30/15 0233 05/31/15 0249 06/01/15 0425 06/02/15 0420 06/03/15 0347 06/04/15 0430  NA 140 140 141 141 141 140  K 3.1* 3.8 3.2* 3.5 3.8 3.8  CL 106 108 108 106 107 104  CO2 24 23 25 26 25 28   GLUCOSE 101* 203* 85 75 96 113*  BUN 27* 31* 31* 31* 29* 27*  CREATININE 3.86* 3.89* 3.86* 3.74* 3.51* 3.61*  CALCIUM 8.0* 8.2* 8.1* 8.2* 8.4* 8.4*  MG 1.7 1.8  --   --   --  1.9   Liver Function Tests: No results for input(s): AST, ALT, ALKPHOS, BILITOT, PROT, ALBUMIN in the last 168 hours. No results for input(s): LIPASE, AMYLASE in the last 168 hours. No results for input(s): AMMONIA in the last 168 hours. CBC:  Recent Labs Lab 05/29/15 1639 05/29/15 1840 06/04/15 0430  WBC 7.3 6.6 12.0*  NEUTROABS 5.3  --   --   HGB 9.7* 8.9* 8.7*  HCT 30.0* 27.7* 26.6*  MCV 86.5 87.7 86.6  PLT 364 339 392   Cardiac Enzymes:  Recent Labs Lab 05/29/15 2100 05/30/15 0233 05/30/15 0818  TROPONINI 0.06* 0.05* 0.04*   BNP: BNP (last 3 results)  Recent Labs  05/29/15 1840  BNP 536.5*    ProBNP (last 3 results) No results for input(s): PROBNP  in the last 8760 hours.  CBG:  Recent Labs Lab 06/02/15 2131 06/03/15 0736 06/03/15 1136 06/03/15 1648 06/04/15 0739  GLUCAP 124* 85 115* 105* 84       Signed:  Nera Haworth  [facility] 06/04/2015, 9:38 AM

## 2015-06-04 NOTE — Progress Notes (Signed)
Patient complained of mask being tight, patient up on side of bed. Mask readjusted. Patient resting comfortably.

## 2015-06-08 ENCOUNTER — Ambulatory Visit (INDEPENDENT_AMBULATORY_CARE_PROVIDER_SITE_OTHER): Payer: 59 | Admitting: Family Medicine

## 2015-06-08 VITALS — BP 154/90 | HR 83 | Temp 98.1°F | Resp 18 | Ht 68.0 in | Wt 354.0 lb

## 2015-06-08 DIAGNOSIS — I5043 Acute on chronic combined systolic (congestive) and diastolic (congestive) heart failure: Secondary | ICD-10-CM

## 2015-06-08 DIAGNOSIS — E1122 Type 2 diabetes mellitus with diabetic chronic kidney disease: Secondary | ICD-10-CM

## 2015-06-08 DIAGNOSIS — Z794 Long term (current) use of insulin: Secondary | ICD-10-CM | POA: Diagnosis not present

## 2015-06-08 DIAGNOSIS — L97911 Non-pressure chronic ulcer of unspecified part of right lower leg limited to breakdown of skin: Secondary | ICD-10-CM

## 2015-06-08 DIAGNOSIS — N184 Chronic kidney disease, stage 4 (severe): Secondary | ICD-10-CM | POA: Diagnosis not present

## 2015-06-08 LAB — COMPREHENSIVE METABOLIC PANEL
ALK PHOS: 70 U/L (ref 40–115)
ALT: 14 U/L (ref 9–46)
AST: 11 U/L (ref 10–35)
Albumin: 3.2 g/dL — ABNORMAL LOW (ref 3.6–5.1)
BILIRUBIN TOTAL: 0.3 mg/dL (ref 0.2–1.2)
BUN: 37 mg/dL — ABNORMAL HIGH (ref 7–25)
CALCIUM: 8.5 mg/dL — AB (ref 8.6–10.3)
CO2: 25 mmol/L (ref 20–31)
Chloride: 104 mmol/L (ref 98–110)
Creat: 3.96 mg/dL — ABNORMAL HIGH (ref 0.70–1.25)
GLUCOSE: 94 mg/dL (ref 65–99)
POTASSIUM: 4.7 mmol/L (ref 3.5–5.3)
Sodium: 140 mmol/L (ref 135–146)
Total Protein: 6.9 g/dL (ref 6.1–8.1)

## 2015-06-08 LAB — CBC
HEMATOCRIT: 29.8 % — AB (ref 39.0–52.0)
Hemoglobin: 9.6 g/dL — ABNORMAL LOW (ref 13.0–17.0)
MCH: 27.7 pg (ref 26.0–34.0)
MCHC: 32.2 g/dL (ref 30.0–36.0)
MCV: 85.9 fL (ref 78.0–100.0)
MPV: 8.7 fL (ref 8.6–12.4)
PLATELETS: 614 10*3/uL — AB (ref 150–400)
RBC: 3.47 MIL/uL — ABNORMAL LOW (ref 4.22–5.81)
RDW: 17.2 % — AB (ref 11.5–15.5)
WBC: 11.3 10*3/uL — AB (ref 4.0–10.5)

## 2015-06-08 MED ORDER — INSULIN GLARGINE 100 UNIT/ML SOLOSTAR PEN: 20.0000 [IU] | PEN_INJECTOR | Freq: Every day | SUBCUTANEOUS | Status: DC

## 2015-06-08 NOTE — Progress Notes (Addendum)
[facility] [address], [place] [address] [phone]  Date:  06/08/2015   Name:  Blake Paez Sr.   DOB:  1950/06/28   MRN:  [id number]  PCP:  No primary care provider on file.    Chief Complaint: Hospitalization Follow-up and Wound Check   History of Present Illness:  Blake Ina Sr. is a 64 y.o. very pleasant male patient who presents with the following:  Here today for a hospital follow-up.  He was admitted from 12/12 to 12/18- he had acute on chronic CHF (systolic and diastolic) as well as OSA, acute on chronic RF, DM.  He had acute hypoxic respiratory failure and required bipap when admitted.  He was diuresed about 11 kg with IV lasix.  ARB was held due to worsening creatinine.   Nephrologist is [name]. Once renal function is better can go back on ARB.   He also has a full thickness right lower leg wound and wound care recommended xeroform during inpt consult.  They are dressing this daily at home and there is some plan for [facility] nursing that is not clear from history today.  Per his son this wound has been present off an on for about 5 years- they are able to get it healed but then it will come back  He states that he is overall feeling ok today- his breathing is ok.  He feels much better than when he was admitted  He is on 20 units of lantus daily. He was rx vial from the hospital but would prefer the pen that he has used in the past  His son Blake Bell accompanies him and seems to have the best grasp on his recent health issues, follow-up plans, etc.  He asks that we call him with any labs or other updates so he can relay this info accurately to his parents  Lab Results  Component Value Date   HGBA1C 6.1 05/29/2015     Wt Readings from Last 3 Encounters:  06/08/15 354 lb (160.573 kg)  06/04/15 354 lb 15.1 oz (161 kg)  05/29/15 380 lb (172.367 kg)   SpO2 Readings from Last 3 Encounters:  06/08/15 93%  06/04/15 95%  05/29/15 97%      Patient Active Problem List   Diagnosis Date Noted  . Acute on chronic diastolic heart failure ([place]) 06/04/2015  . H/O: CVA (cerebrovascular accident) 06/04/2015  . Anemia, iron deficiency 06/04/2015  . Acute on chronic systolic (congestive) heart failure ([place]) 05/29/2015  . CHF exacerbation ([place]) 05/29/2015  . Uncontrolled diabetes with stage 4 chronic kidney disease GFR 15-29 ([place]) 12/28/2014  . Type II diabetes mellitus with neurological manifestations ([place]) 12/12/2014  . Acute ischemic stroke ([place]) 12/02/2014  . Acute ischemic left MCA stroke ([place]) 12/02/2014  . Stroke ([place]) 03/24/2014  . Type 2 diabetes mellitus with complication ([place]) [id number]  . Essential hypertension, benign 03/24/2014  . HLD (hyperlipidemia) 03/24/2014  . CVA (cerebral infarction) 01/13/2014  . AKI (acute kidney injury) ([place]) 07/03/2013  . Hypernatremia 07/03/2013  . Morbid obesity ([place]) 07/03/2013  . Venous stasis ulcers ([place]) 07/03/2013  . DM (diabetes mellitus) type 2, uncontrolled, with ketoacidosis ([place]) 07/03/2013  . 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 Sellar or suprasellar mass 06/22/2013  . Acute renal failure superimposed on stage 4 chronic kidney disease ([place]) 06/22/2013  . Acute respiratory failure ([place]) 06/22/2013  . Seizure ([place]) 06/21/2013  . Altered mental status 06/21/2013  . DKA (diabetic ketoacidoses) ([place]) 06/21/2013  .  Hypercarbia 06/21/2013  . OSA (obstructive sleep apnea) 05/25/2013    Past Medical History  Diagnosis Date  . Hypertension   . Multiple wounds     on lower legs - sees [name] Pain at the [facility]  . Hypercholesterolemia   . OSA on CPAP     uses cpap, setting of 5  . 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 Type II diabetes mellitus ([place])     uncontrolled/notes 12/02/2014  . Chronic kidney disease (CKD), stage III (moderate)     sees dr Florene Glen nephrology every 6-9 months  . CVA (cerebral vascular accident) [facility]) 12/2013     admitted in July 2015 for acute right-sided infarct/notes 12/02/2014  . Stroke ([place]) 12/02/2014    expressive aphasia  . Systolic CHF [facility])     Past Surgical History  Procedure Laterality Date  . Colonoscopy  6-7 yrs ago  . Colonoscopy N/A 04/05/2013    Procedure: COLONOSCOPY;  Surgeon: Juanita Craver, MD;  Location: [facility] ENDOSCOPY;  Service: Endoscopy;  Laterality: N/A;    Social History  Substance Use Topics  . Smoking status: Former Smoker -- 0.25 packs/day for 2 years    Types: Cigarettes    Quit date: 06/17/1970  . Smokeless tobacco: Never Used  . Alcohol Use: No    Family History  Problem Relation Age of Onset  . Diabetes Mother   . Hypertension Mother   . Diabetes Father   . Hypertension Father   . Diabetes Sister   . Diabetes Brother   . Asthma Son     No Known Allergies  Medication list has been reviewed and updated.  Current Outpatient Prescriptions on File Prior to Visit  Medication Sig Dispense Refill  . Albiglutide (TANZEUM) 30 MG PEN Inject 30 mg into the skin once a week. 1 each 2  . amLODipine (NORVASC) 10 MG tablet Take 1 tablet (10 mg total) by mouth daily. 30 tablet 0  . aspirin 325 MG tablet Take 325 mg by mouth daily.    [place] atorvastatin (LIPITOR) 80 MG tablet Take 1 tablet (80 mg total) by mouth daily at 6 PM. 30 tablet 0  . B-D ULTRAFINE III SHORT PEN 31G X 8 MM MISC USE WITH SOLOSTAR AND NOVOLOG 100 each 1  . cabergoline (DOSTINEX) 0.5 MG tablet Take 0.5 tablets (0.25 mg total) by mouth 2 (two) times a week. 12 tablet 0  . clopidogrel (PLAVIX) 75 MG tablet Take 1 tablet (75 mg total) by mouth daily with breakfast. 30 tablet 5  . ferrous sulfate 325 (65 FE) MG tablet Take 1 tablet (325 mg total) by mouth 2 (two) times daily with a meal. 60 tablet 0  . furosemide (LASIX) 80 MG tablet Take 1 tablet (80 mg total) by mouth 2 (two) times daily. 60 tablet 0  . insulin glargine (LANTUS) 100 UNIT/ML injection Inject 0.2 mLs (20 Units total) into the skin daily.  10 mL 0  . isosorbide-hydrALAZINE (BIDIL) 20-37.5 MG per tablet Take 2 tablets by mouth 3 (three) times daily. 180 tablet 0  . labetalol (NORMODYNE) 200 MG tablet TAKE 1 TABLET 3 TIMES A DAY. 90 tablet 4  . potassium chloride SA (K-DUR,KLOR-CON) 20 MEQ tablet Take 2 tablets (40 mEq total) by mouth daily. 30 tablet 0  . spironolactone (ALDACTONE) 25 MG tablet Take 25 mg by mouth daily.  6  . Syringe, Disposable, 1 ML MISC Insulin syringes as needed to go with medications - lantus  and tanzeum 200 each 11   No current facility-administered medications on file prior to visit.    Review of Systems:  As per HPI- otherwise negative. Swelling of bilateral LE is not new  Physical Examination: Filed Vitals:   06/08/15 0859  BP: 154/90  Pulse: 83  Temp: 98.1 F (36.7 C)  Resp: 18   Filed Vitals:   06/08/15 0859  Height: 5\' 8"  (1.727 m)  Weight: 354 lb (160.573 kg)   Body mass index is 53.84 kg/(m^2). Ideal Body Weight: Weight in (lb) to have BMI = 25: 164.1  GEN: WDWN, NAD, Non-toxic, A & O x 3, obese, appears to feel ok today HEENT: Atraumatic, Normocephalic. Neck supple. No masses, No LAD. Ears and Nose: No external deformity. CV: RRR, No M/G/R. No JVD. No thrill. No extra heart sounds. PULM: CTA B, no wheezes, crackles, rhonchi. No retractions. No resp. distress. No accessory muscle use. EXTR: No c/c.  Pitting, chronic and woody appearing edema of BLE NEURO moves slowly, uses a cane.   PSYCH: Normally interactive. Conversant. Not depressed or anxious appearing.  Calm demeanor.  Right leg:  There is a shallow, weepy ulcer on the lateral lower right leg. It is approx 3x3 inches in size.    Wound care; wound was bathed with wet gauze. Patted dry.  Applied xeroform gauze and dressing   Results for orders placed or performed in visit on 06/08/15  CBC  Result Value Ref Range   WBC 11.3 (H) 4.0 - 10.5 K/uL   RBC 3.47 (L) 4.22 - 5.81 MIL/uL   Hemoglobin 9.6 (L) 13.0 - 17.0 g/dL    HCT 29.8 (L) 39.0 - 52.0 %   MCV 85.9 78.0 - 100.0 fL   MCH 27.7 26.0 - 34.0 pg   MCHC 32.2 30.0 - 36.0 g/dL   RDW 17.2 (H) 11.5 - 15.5 %   Platelets 614 (H) 150 - 400 K/uL   MPV 8.7 8.6 - 12.4 fL  Comprehensive metabolic panel  Result Value Ref Range   Sodium 140 135 - 146 mmol/L   Potassium 4.7 3.5 - 5.3 mmol/L   Chloride 104 98 - 110 mmol/L   CO2 25 20 - 31 mmol/L   Glucose, Bld 94 65 - 99 mg/dL   BUN 37 (H) 7 - 25 mg/dL   Creat 3.96 (H) 0.70 - 1.25 mg/dL   Total Bilirubin 0.3 0.2 - 1.2 mg/dL   Alkaline Phosphatase 70 40 - 115 U/L   AST 11 10 - 35 U/L   ALT 14 9 - 46 U/L   Total Protein 6.9 6.1 - 8.1 g/dL   Albumin 3.2 (L) 3.6 - 5.1 g/dL   Calcium 8.5 (L) 8.6 - 10.3 mg/dL    Assessment and Plan: Chronic ulcer of leg, right, limited to breakdown of skin (HCC)  Controlled type 2 diabetes mellitus with stage 4 chronic kidney disease, with long-term current use of insulin (HCC) - Plan: Comprehensive metabolic panel, Insulin Glargine (LANTUS SOLOSTAR) 100 UNIT/ML Solostar Pen  Acute on chronic combined systolic and diastolic congestive heart failure (HCC) - Plan: CBC  Performed wound care for his chronic leg ulcer.  He has been seen by outpt wound care but it has been about a year.  ?this would be helpful for him.  Will work on this wound at home and at [facility] until after the holidays and then consider [facility] referral if appropriate  They will see me next week for a recheck and wound care.  His  DM has been under good control, he is on 20u of lantus.  rx for the solostar pen as he is used to this method  CRF- after I received his labs I spoke with [name]- he will have pt come in within the next few weeks.  However creat of 4 is not unexpected or acutely alarming in this case.  Also alerted Dr. Irven Shelling office that he was in the hospital so they can arrange follow-up.    Signed  Blake Blinks, MD  [facility] Blake Bell is [phone] Received the rest of his labs as above.  Called Blake Bell and [facility] but did not get to speak to him.  Will try back   Called his son on 12/23 and went over labs Anemia likely of chronic disease- this is improved.  Thrombocytosis- recheck at our visit next week. Renal function is about at baseline as above- they should received follow-up from cardiology and nephrology in the next few weeks

## 2015-06-14 ENCOUNTER — Ambulatory Visit (INDEPENDENT_AMBULATORY_CARE_PROVIDER_SITE_OTHER): Payer: 59 | Admitting: Family Medicine

## 2015-06-14 VITALS — BP 160/92 | HR 73 | Temp 97.8°F | Resp 16 | Ht 68.0 in | Wt 357.0 lb

## 2015-06-14 DIAGNOSIS — D473 Essential (hemorrhagic) thrombocythemia: Secondary | ICD-10-CM

## 2015-06-14 DIAGNOSIS — I83019 Varicose veins of right lower extremity with ulcer of unspecified site: Secondary | ICD-10-CM

## 2015-06-14 DIAGNOSIS — Z794 Long term (current) use of insulin: Secondary | ICD-10-CM

## 2015-06-14 DIAGNOSIS — E118 Type 2 diabetes mellitus with unspecified complications: Secondary | ICD-10-CM

## 2015-06-14 DIAGNOSIS — IMO0001 Reserved for inherently not codable concepts without codable children: Secondary | ICD-10-CM

## 2015-06-14 DIAGNOSIS — D75839 Thrombocytosis, unspecified: Secondary | ICD-10-CM

## 2015-06-14 LAB — CBC
HEMATOCRIT: 28.1 % — AB (ref 39.0–52.0)
HEMOGLOBIN: 9 g/dL — AB (ref 13.0–17.0)
MCH: 27.3 pg (ref 26.0–34.0)
MCHC: 32 g/dL (ref 30.0–36.0)
MCV: 85.2 fL (ref 78.0–100.0)
MPV: 8.4 fL — AB (ref 8.6–12.4)
Platelets: 566 10*3/uL — ABNORMAL HIGH (ref 150–400)
RBC: 3.3 MIL/uL — AB (ref 4.22–5.81)
RDW: 17.9 % — AB (ref 11.5–15.5)
WBC: 7.6 10*3/uL (ref 4.0–10.5)

## 2015-06-14 LAB — MICROALBUMIN, URINE: Microalb, Ur: 216.5 mg/dL

## 2015-06-14 NOTE — Progress Notes (Signed)
[facility] [address], [place] [address] [phone]  Date:  06/14/2015   Name:  Blake Poppleton Sr.   DOB:  02-May-1951   MRN:  [id number]  PCP:  No primary care provider on file.    Chief Complaint: wound care   History of Present Illness:  Blake Shearer Sr. is a 64 y.o. very pleasant male patient who presents with the following:  Here today mostly to follow-up a wound on the right lower extremity- part of my HPI from 12/22 as follows:  Here today for a hospital follow-up. He was admitted from 12/12 to 12/18- he had acute on chronic CHF (systolic and diastolic) as well as OSA, acute on chronic RF, DM. He had acute hypoxic respiratory failure and required bipap when admitted. He was diuresed about 11 kg with IV lasix. ARB was held due to worsening creatinine.  Nephrologist is [name]. Once renal function is better can go back on ARB.   He also has a full thickness right lower leg wound and wound care recommended xeroform during inpt consult. They are dressing this daily at home and there is some plan for [facility] nursing that is not clear from history today. Per his son this wound has been present off an on for about 5 years- they are able to get it healed but then it will come back.  They will see DR. [name] tomorrow for a recheck Home health is treating his wound once a week.    Wt Readings from Last 3 Encounters:  06/14/15 357 lb (161.934 kg)  06/08/15 354 lb (160.573 kg)  06/04/15 354 lb 15.1 oz (161 kg)     Patient Active Problem List   Diagnosis Date Noted  . Acute on chronic diastolic heart failure ([place]) 06/04/2015  . H/O: CVA (cerebrovascular accident) 06/04/2015  . Anemia, iron deficiency 06/04/2015  . Acute on chronic systolic (congestive) heart failure ([place]) 05/29/2015  . CHF exacerbation ([place]) 05/29/2015  . Uncontrolled diabetes with stage 4 chronic kidney disease GFR 15-29 ([place]) 12/28/2014  . Type II diabetes mellitus with  neurological manifestations ([place]) 12/12/2014  . Acute ischemic stroke ([place]) 12/02/2014  . Acute ischemic left MCA stroke ([place]) 12/02/2014  . Stroke ([place]) 03/24/2014  . Type 2 diabetes mellitus with complication ([place]) [id number]  . Essential hypertension, benign 03/24/2014  . HLD (hyperlipidemia) 03/24/2014  . CVA (cerebral infarction) 01/13/2014  . AKI (acute kidney injury) ([place]) 07/03/2013  . Hypernatremia 07/03/2013  . Morbid obesity ([place]) 07/03/2013  . Venous stasis ulcers ([place]) 07/03/2013  . DM (diabetes mellitus) type 2, uncontrolled, with ketoacidosis ([place]) 07/03/2013  . 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 Sellar or suprasellar mass 06/22/2013  . Acute renal failure superimposed on stage 4 chronic kidney disease ([place]) 06/22/2013  . Acute respiratory failure ([place]) 06/22/2013  . Seizure ([place]) 06/21/2013  . Altered mental status 06/21/2013  . DKA (diabetic ketoacidoses) ([place]) 06/21/2013  . Hypercarbia 06/21/2013  . OSA (obstructive sleep apnea) 05/25/2013    Past Medical History  Diagnosis Date  . Hypertension   . Multiple wounds     on lower legs - sees [name] Pain at the [facility]  . Hypercholesterolemia   . OSA on CPAP     uses cpap, setting of 5  . 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 Type II diabetes mellitus ([place])     uncontrolled/notes 12/02/2014  . Chronic kidney disease (CKD), stage III (moderate)  sees dr Florene Glen nephrology every 6-9 months  . CVA (cerebral vascular accident) [facility]) 12/2013    admitted in July 2015 for acute right-sided infarct/notes 12/02/2014  . Stroke ([place]) 12/02/2014    expressive aphasia  . Systolic CHF [facility])     Past Surgical History  Procedure Laterality Date  . Colonoscopy  6-7 yrs ago  . Colonoscopy N/A 04/05/2013    Procedure: COLONOSCOPY;  Surgeon: Juanita Craver, MD;  Location: [facility];  Service: Endoscopy;  Laterality: N/A;    Social History  Substance Use Topics  . Smoking status: Former  Smoker -- 0.25 packs/day for 2 years    Types: Cigarettes    Quit date: 06/17/1970  . Smokeless tobacco: Never Used  . Alcohol Use: No    Family History  Problem Relation Age of Onset  . Diabetes Mother   . Hypertension Mother   . Diabetes Father   . Hypertension Father   . Diabetes Sister   . Diabetes Brother   . Asthma Son     No Known Allergies  Medication list has been reviewed and updated.  Current Outpatient Prescriptions on File Prior to Visit  Medication Sig Dispense Refill  . Albiglutide (TANZEUM) 30 MG PEN Inject 30 mg into the skin once a week. 1 each 2  . amLODipine (NORVASC) 10 MG tablet Take 1 tablet (10 mg total) by mouth daily. 30 tablet 0  . aspirin 325 MG tablet Take 325 mg by mouth daily.    Marland Kitchen atorvastatin (LIPITOR) 80 MG tablet Take 1 tablet (80 mg total) by mouth daily at 6 PM. 30 tablet 0  . B-D ULTRAFINE III SHORT PEN 31G X 8 MM MISC USE WITH SOLOSTAR AND NOVOLOG 100 each 1  . cabergoline (DOSTINEX) 0.5 MG tablet Take 0.5 tablets (0.25 mg total) by mouth 2 (two) times a week. 12 tablet 0  . clopidogrel (PLAVIX) 75 MG tablet Take 1 tablet (75 mg total) by mouth daily with breakfast. 30 tablet 5  . ferrous sulfate 325 (65 FE) MG tablet Take 1 tablet (325 mg total) by mouth 2 (two) times daily with a meal. 60 tablet 0  . furosemide (LASIX) 80 MG tablet Take 1 tablet (80 mg total) by mouth 2 (two) times daily. 60 tablet 0  . Insulin Glargine (LANTUS SOLOSTAR) 100 UNIT/ML Solostar Pen Inject 20 Units into the skin daily. 5 pen 6  . isosorbide-hydrALAZINE (BIDIL) 20-37.5 MG per tablet Take 2 tablets by mouth 3 (three) times daily. 180 tablet 0  . labetalol (NORMODYNE) 200 MG tablet TAKE 1 TABLET 3 TIMES A DAY. 90 tablet 4  . potassium chloride SA (K-DUR,KLOR-CON) 20 MEQ tablet Take 2 tablets (40 mEq total) by mouth daily. 30 tablet 0  . spironolactone (ALDACTONE) 25 MG tablet Take 25 mg by mouth daily.  6  . Syringe, Disposable, 1 ML MISC Insulin syringes as  needed to go with medications - lantus and tanzeum 200 each 11   No current facility-administered medications on file prior to visit.    Review of Systems:  As per HPI- otherwise negative.   Physical Examination: Filed Vitals:   06/14/15 1153 06/14/15 1154  BP: 167/84 169/84  Pulse: 76 73  Temp: 97.8 F (36.6 C)   Resp: 16    Filed Vitals:   06/14/15 1153  Height: 5\' 8"  (1.727 m)  Weight: 357 lb (161.934 kg)   Body mass index is 54.29 kg/(m^2). Ideal Body Weight: Weight in (lb) to have BMI = 25: 164.1  GEN: WDWN, NAD, Non-toxic, A & O x 3, obese, looks well, here with his son today HEENT: Atraumatic, Normocephalic. Neck supple. No masses, No LAD. Ears and Nose: No external deformity. CV: RRR, No M/G/R. No JVD. No thrill. No extra heart sounds. PULM: CTA B, no wheezes, crackles, rhonchi. No retractions. No resp. distress. No accessory muscle use. EXTR: No c/c/e NEURO Normal gait.  PSYCH: Normally interactive. Conversant. Not depressed or anxious appearing.  Calm demeanor.  Large stasis ulcer on the lateral right lower leg.  Removed dressing, washed with water and patted with peroxide, replaced xeroform gauze and dressed wound  Assessment and Plan: Venous stasis ulcer, right ([place]) - Plan: AMB referral to wound care center  Thrombocytosis ([place]) - Plan: CBC  Controlled type 2 diabetes mellitus with complication, with long-term current use of insulin (HCC) - Plan: Microalbumin, urine  Treated his wound today- referal to wound care. We will continue to see this would along with home health while referral is pending He will see Dr. Einar Gip tomorrow for his cardiac issues.   Check microalbumin and CBC today   Signed Lamar Blinks, MD

## 2015-06-15 ENCOUNTER — Encounter: Payer: Self-pay | Admitting: Family Medicine

## 2015-06-21 ENCOUNTER — Ambulatory Visit (INDEPENDENT_AMBULATORY_CARE_PROVIDER_SITE_OTHER): Payer: BLUE CROSS/BLUE SHIELD | Admitting: Family Medicine

## 2015-06-21 ENCOUNTER — Encounter: Payer: Self-pay | Admitting: Family Medicine

## 2015-06-21 VITALS — BP 140/80 | HR 76 | Temp 97.9°F | Resp 16 | Ht 67.25 in | Wt 348.4 lb

## 2015-06-21 DIAGNOSIS — R600 Localized edema: Secondary | ICD-10-CM

## 2015-06-21 DIAGNOSIS — Z5189 Encounter for other specified aftercare: Secondary | ICD-10-CM | POA: Diagnosis not present

## 2015-06-21 NOTE — Progress Notes (Signed)
[facility] [address], [place] [address] [phone]  Date:  06/21/2015   Name:  Blake Hladky Sr.   DOB:  29-Jan-1951   MRN:  [id number]  PCP:  No primary care provider on file.    Chief Complaint: Follow-up and wound care right leg   History of Present Illness:  Blake Siplin Sr. is a 65 y.o. very pleasant male patient who presents with the following:  Here today to check wound on his right leg. He will see Dr. Einar Gip next week and saw his nephrologist just recently He is feeling well, breathing well, no concerns, denies any pain.  They are checking his wound at home somewhat eratically.  However they think it is doing well  Wt Readings from Last 3 Encounters:  06/21/15 348 lb 6.4 oz (158.033 kg)  06/14/15 357 lb (161.934 kg)  06/08/15 354 lb (160.573 kg)     Patient Active Problem List   Diagnosis Date Noted  . Acute on chronic diastolic heart failure ([place]) 06/04/2015  . H/O: CVA (cerebrovascular accident) 06/04/2015  . Anemia, iron deficiency 06/04/2015  . Acute on chronic systolic (congestive) heart failure ([place]) 05/29/2015  . CHF exacerbation ([place]) 05/29/2015  . Uncontrolled diabetes with stage 4 chronic kidney disease GFR 15-29 ([place]) 12/28/2014  . Type II diabetes mellitus with neurological manifestations ([place]) 12/12/2014  . Acute ischemic stroke ([place]) 12/02/2014  . Acute ischemic left MCA stroke ([place]) 12/02/2014  . Stroke ([place]) 03/24/2014  . Type 2 diabetes mellitus with complication ([place]) [id number]  . Essential hypertension, benign 03/24/2014  . HLD (hyperlipidemia) 03/24/2014  . CVA (cerebral infarction) 01/13/2014  . AKI (acute kidney injury) ([place]) 07/03/2013  . Hypernatremia 07/03/2013  . Morbid obesity ([place]) 07/03/2013  . Venous stasis ulcers ([place]) 07/03/2013  . DM (diabetes mellitus) type 2, uncontrolled, with ketoacidosis ([place]) 07/03/2013  . 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 Sellar or suprasellar mass 06/22/2013  .  Acute renal failure superimposed on stage 4 chronic kidney disease ([place]) 06/22/2013  . Acute respiratory failure ([place]) 06/22/2013  . Seizure ([place]) 06/21/2013  . Altered mental status 06/21/2013  . DKA (diabetic ketoacidoses) ([place]) 06/21/2013  . Hypercarbia 06/21/2013  . OSA (obstructive sleep apnea) 05/25/2013    Past Medical History  Diagnosis Date  . Hypertension   . Multiple wounds     on lower legs - sees [name] Pain at the [facility]  . Hypercholesterolemia   . OSA on CPAP     uses cpap, setting of 5  . 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 Type II diabetes mellitus ([place])     uncontrolled/notes 12/02/2014  . Chronic kidney disease (CKD), stage III (moderate)     sees dr Florene Glen nephrology every 6-9 months  . CVA (cerebral vascular accident) [facility]) 12/2013    admitted in July 2015 for acute right-sided infarct/notes 12/02/2014  . Stroke ([place]) 12/02/2014    expressive aphasia  . Systolic CHF [facility])     Past Surgical History  Procedure Laterality Date  . Colonoscopy  6-7 yrs ago  . Colonoscopy N/A 04/05/2013    Procedure: COLONOSCOPY;  Surgeon: Juanita Craver, MD;  [facility];  Service: Endoscopy;  Laterality: N/A;    Social History  Substance Use Topics  . Smoking status: Former Smoker -- 0.25 packs/day for 2 years    Types: Cigarettes    Quit date: 06/17/1970  . Smokeless tobacco: Never Used  . Alcohol Use: No  Family History  Problem Relation Age of Onset  . Diabetes Mother   . Hypertension Mother   . Diabetes Father   . Hypertension Father   . Diabetes Sister   . Diabetes Brother   . Asthma Son     No Known Allergies  Medication list has been reviewed and updated.  Current Outpatient Prescriptions on File Prior to Visit  Medication Sig Dispense Refill  . Albiglutide (TANZEUM) 30 MG PEN Inject 30 mg into the skin once a week. 1 each 2  . amLODipine (NORVASC) 10 MG tablet Take 1 tablet (10 mg total) by mouth daily.  30 tablet 0  . aspirin 325 MG tablet Take 325 mg by mouth daily.    Marland Kitchen atorvastatin (LIPITOR) 80 MG tablet Take 1 tablet (80 mg total) by mouth daily at 6 PM. 30 tablet 0  . B-D ULTRAFINE III SHORT PEN 31G X 8 MM MISC USE WITH SOLOSTAR AND NOVOLOG 100 each 1  . cabergoline (DOSTINEX) 0.5 MG tablet Take 0.5 tablets (0.25 mg total) by mouth 2 (two) times a week. 12 tablet 0  . clopidogrel (PLAVIX) 75 MG tablet Take 1 tablet (75 mg total) by mouth daily with breakfast. 30 tablet 5  . ferrous sulfate 325 (65 FE) MG tablet Take 1 tablet (325 mg total) by mouth 2 (two) times daily with a meal. 60 tablet 0  . furosemide (LASIX) 80 MG tablet Take 1 tablet (80 mg total) by mouth 2 (two) times daily. 60 tablet 0  . Insulin Glargine (LANTUS SOLOSTAR) 100 UNIT/ML Solostar Pen Inject 20 Units into the skin daily. 5 pen 6  . isosorbide-hydrALAZINE (BIDIL) 20-37.5 MG per tablet Take 2 tablets by mouth 3 (three) times daily. 180 tablet 0  . labetalol (NORMODYNE) 200 MG tablet TAKE 1 TABLET 3 TIMES A DAY. 90 tablet 4  . potassium chloride SA (K-DUR,KLOR-CON) 20 MEQ tablet Take 2 tablets (40 mEq total) by mouth daily. 30 tablet 0  . Syringe, Disposable, 1 ML MISC Insulin syringes as needed to go with medications - lantus and tanzeum 200 each 11  . spironolactone (ALDACTONE) 25 MG tablet Take 25 mg by mouth daily. Reported on 06/21/2015  6   No current facility-administered medications on file prior to visit.    Review of Systems:  As per HPI- otherwise negative.   Physical Examination: Filed Vitals:   06/21/15 0945 06/21/15 0955  BP: 146/90 140/80  Pulse: 76   Temp: 97.9 F (36.6 C)   Resp: 16    Filed Vitals:   06/21/15 0945  Height: 5' 7.25" (1.708 m)  Weight: 348 lb 6.4 oz (158.033 kg)   Body mass index is 54.17 kg/(m^2). Ideal Body Weight: Weight in (lb) to have BMI = 25: 160.5  GEN: WDWN, NAD, Non-toxic, A & O x 3, obese, walks with cane. Appears well.  Accompanied today by his wife HEENT:  Atraumatic, Normocephalic. Neck supple. No masses, No LAD. Ears and Nose: No external deformity. CV: RRR, No M/G/R. No JVD. No thrill. No extra heart sounds. PULM: CTA B, no wheezes, crackles, rhonchi. No retractions. No resp. distress. No accessory muscle use. EXTR: No c/c/e NEURO Normal gait for pt.  PSYCH: Normally interactive. Conversant. Not depressed or anxious appearing.  Calm demeanor.  Right [id number]  venous stasis ulcer on external ankle/ shin does appear to be doing well.  Less odor, it appears to be healing. No discharge from wound.  Cleaned with peroxide and dressed with xeroform gauze and cobain  Assessment and Plan: Encounter for wound care  Pedal edema  Overall he is doing very well!  Wound is healing Recheck in one week See Dr. Einar Gip next week Weight is down. He is breathing well  Signed Lamar Blinks, MD

## 2015-06-27 ENCOUNTER — Telehealth: Payer: Self-pay

## 2015-06-27 NOTE — Telephone Encounter (Signed)
Can we Rx? Looks like we did not prescribe this according to last refill.

## 2015-06-27 NOTE — Telephone Encounter (Signed)
°  Pt's spouse called req. Refill for potassium chloride SA (K-DUR,KLOR-CON) 20 MEQ tablet [id number]   [id number] - [place], [place]  [phone]

## 2015-06-28 ENCOUNTER — Ambulatory Visit (INDEPENDENT_AMBULATORY_CARE_PROVIDER_SITE_OTHER): Payer: BLUE CROSS/BLUE SHIELD | Admitting: Family Medicine

## 2015-06-28 VITALS — BP 168/83 | HR 81 | Temp 98.1°F | Resp 16 | Ht 68.0 in | Wt 358.0 lb

## 2015-06-28 DIAGNOSIS — L97811 Non-pressure chronic ulcer of other part of right lower leg limited to breakdown of skin: Secondary | ICD-10-CM | POA: Diagnosis not present

## 2015-06-28 DIAGNOSIS — I509 Heart failure, unspecified: Secondary | ICD-10-CM | POA: Diagnosis not present

## 2015-06-28 DIAGNOSIS — R635 Abnormal weight gain: Secondary | ICD-10-CM | POA: Diagnosis not present

## 2015-06-28 DIAGNOSIS — L97911 Non-pressure chronic ulcer of unspecified part of right lower leg limited to breakdown of skin: Secondary | ICD-10-CM

## 2015-06-28 MED ORDER — POTASSIUM CHLORIDE CRYS ER 20 MEQ PO TBCR: 40.0000 meq | EXTENDED_RELEASE_TABLET | Freq: Every day | ORAL | Status: DC

## 2015-06-28 MED ORDER — METOLAZONE 5 MG PO TABS: 5.0000 mg | ORAL_TABLET | Freq: Every day | ORAL | Status: DC

## 2015-06-28 NOTE — Telephone Encounter (Signed)
Defer to Dr. Lorelei Pont since she saw Pt today.

## 2015-06-28 NOTE — Progress Notes (Signed)
[facility] [address], [place] [address] [phone]  Date:  06/28/2015   Name:  Blake Woolman Sr.   DOB:  [birth year]   MRN:  [id number]  PCP:  No primary care provider on file.    Chief Complaint: Follow-up   History of Present Illness:  Blake Mcwhinnie Sr. is a 65 y.o. very pleasant male patient who presents with the following:  Here today for wound care- last here a week ago at which time his RLE stasis ulcer was looking ok. At his last visit he had stated that he was seeing Dr. Einar Gip this week but he is not aware of when exactly this appt is.  Called and confirmed that he has an appt with [name] on Friday at 11:45  He states that he is breathing well.   He is feeling well overall. He does admit that he has been eating more than maybe he should.  He was not aware that he had gained 10 lbs however  He states that the last 2 times he had a flu shot he "got sick," so he prefers not to do this today  He did have a CVA at some point in the past.  When I have seen him before he has been with family members who "took the lead" as far as his care.  He seems somewhat confused about his care   Lab Results  Component Value Date   HGBA1C 6.1 05/29/2015     Wt Readings from Last 3 Encounters:  06/28/15 358 lb (162.388 kg)  06/21/15 348 lb 6.4 oz (158.033 kg)  06/14/15 357 lb (161.934 kg)     Patient Active Problem List   Diagnosis Date Noted  . Acute on chronic diastolic heart failure ([place]) 06/04/2015  . H/O: CVA (cerebrovascular accident) 06/04/2015  . Anemia, iron deficiency 06/04/2015  . Acute on chronic systolic (congestive) heart failure ([place]) 05/29/2015  . CHF exacerbation ([place]) 05/29/2015  . Uncontrolled diabetes with stage 4 chronic kidney disease GFR 15-29 ([place]) 12/28/2014  . Type II diabetes mellitus with neurological manifestations ([place]) 12/12/2014  . Acute ischemic stroke ([place]) 12/02/2014  . Acute ischemic left MCA stroke ([place])  12/02/2014  . Stroke ([place]) 03/24/2014  . Type 2 diabetes mellitus with complication ([place]) [id number]  . Essential hypertension, benign 03/24/2014  . HLD (hyperlipidemia) 03/24/2014  . CVA (cerebral infarction) 01/13/2014  . AKI (acute kidney injury) ([place]) 07/03/2013  . Hypernatremia 07/03/2013  . Morbid obesity ([place]) 07/03/2013  . Venous stasis ulcers ([place]) 07/03/2013  . DM (diabetes mellitus) type 2, uncontrolled, with ketoacidosis ([place]) 07/03/2013  . 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 Sellar or suprasellar mass 06/22/2013  . Acute renal failure superimposed on stage 4 chronic kidney disease ([place]) 06/22/2013  . Acute respiratory failure ([place]) 06/22/2013  . Seizure ([place]) 06/21/2013  . Altered mental status 06/21/2013  . DKA (diabetic ketoacidoses) ([place]) 06/21/2013  . Hypercarbia 06/21/2013  . OSA (obstructive sleep apnea) 05/25/2013    Past Medical History  Diagnosis Date  . Hypertension   . Multiple wounds     on lower legs - sees [name] Pain at the [facility]  . Hypercholesterolemia   . OSA on CPAP     uses cpap, setting of 5  . 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 Type II diabetes mellitus ([place])     uncontrolled/notes 12/02/2014  . Chronic kidney disease (CKD), stage III (moderate)     sees  dr Florene Glen nephrology every 6-9 months  . CVA (cerebral vascular accident) [facility]) 12/2013    admitted in July 2015 for acute right-sided infarct/notes 12/02/2014  . Stroke ([place]) 12/02/2014    expressive aphasia  . Systolic CHF [facility])     Past Surgical History  Procedure Laterality Date  . Colonoscopy  6-7 yrs ago  . Colonoscopy N/A 04/05/2013    Procedure: COLONOSCOPY;  Surgeon: Juanita Craver, MD;  [facility];  Service: Endoscopy;  Laterality: N/A;    Social History  Substance Use Topics  . Smoking status: Former Smoker -- 0.25 packs/day for 2 years    Types: Cigarettes    Quit date: 06/17/1970  . Smokeless tobacco: Never Used  .  Alcohol Use: No    Family History  Problem Relation Age of Onset  . Diabetes Mother   . Hypertension Mother   . Diabetes Father   . Hypertension Father   . Diabetes Sister   . Diabetes Brother   . Asthma Son     No Known Allergies  Medication list has been reviewed and updated.  Current Outpatient Prescriptions on File Prior to Visit  Medication Sig Dispense Refill  . Albiglutide (TANZEUM) 30 MG PEN Inject 30 mg into the skin once a week. 1 each 2  . aspirin 325 MG tablet Take 325 mg by mouth daily.    Marland Kitchen atorvastatin (LIPITOR) 80 MG tablet Take 1 tablet (80 mg total) by mouth daily at 6 PM. 30 tablet 0  . cabergoline (DOSTINEX) 0.5 MG tablet Take 0.5 tablets (0.25 mg total) by mouth 2 (two) times a week. 12 tablet 0  . clopidogrel (PLAVIX) 75 MG tablet Take 1 tablet (75 mg total) by mouth daily with breakfast. 30 tablet 5  . ferrous sulfate 325 (65 FE) MG tablet Take 1 tablet (325 mg total) by mouth 2 (two) times daily with a meal. 60 tablet 0  . furosemide (LASIX) 80 MG tablet Take 1 tablet (80 mg total) by mouth 2 (two) times daily. 60 tablet 0  . Insulin Glargine (LANTUS SOLOSTAR) 100 UNIT/ML Solostar Pen Inject 20 Units into the skin daily. 5 pen 6  . isosorbide-hydrALAZINE (BIDIL) 20-37.5 MG per tablet Take 2 tablets by mouth 3 (three) times daily. 180 tablet 0  . labetalol (NORMODYNE) 200 MG tablet TAKE 1 TABLET 3 TIMES A DAY. 90 tablet 4  . potassium chloride SA (K-DUR,KLOR-CON) 20 MEQ tablet Take 2 tablets (40 mEq total) by mouth daily. 30 tablet 0  . amLODipine (NORVASC) 10 MG tablet Take 1 tablet (10 mg total) by mouth daily. (Patient not taking: Reported on 06/28/2015) 30 tablet 0  . B-D ULTRAFINE III SHORT PEN 31G X 8 MM MISC USE WITH SOLOSTAR AND NOVOLOG 100 each 1  . Syringe, Disposable, 1 ML MISC Insulin syringes as needed to go with medications - lantus and tanzeum 200 each 11   No current facility-administered medications on file prior to visit.    Review of  Systems:  As per HPI- otherwise negative.   Physical Examination: Filed Vitals:   06/28/15 0958  BP: 181/88  Pulse: 81  Temp: 98.1 F (36.7 C)  Resp: 16   Filed Vitals:   06/28/15 0958  Height: 5\' 8"  (1.727 m)  Weight: 358 lb (162.388 kg)   Body mass index is 54.45 kg/(m^2). Ideal Body Weight: Weight in (lb) to have BMI = 25: 164.1  GEN: WDWN, NAD, Non-toxic, A & O x 3, morbid obesity, OW looks well HEENT:  Atraumatic, Normocephalic. Neck supple. No masses, No LAD. Ears and Nose: No external deformity. CV: RRR, No M/G/R. No JVD. No thrill. No extra heart sounds. PULM: CTA B, no wheezes, crackles, rhonchi. No retractions. No resp. distress. No accessory muscle use.Marland Kitchen EXTR: No c/c/.  Chronic appearing edema Right LE with chronic venous stasis ulcer on lateral aspect of leg.  Cleaned and re-dressed as per usual routine from previous visits  NEURO Normal gait.  PSYCH: Normally interactive. Conversant. Not depressed or anxious appearing.  Calm demeanor.   Called and spoke with Dr. Einar Gip- will add 5mg  of matolozone to his diuretic regimen Assessment and Plan: Non-healing ulcer of lower leg, right, limited to breakdown of skin ([place]) - Plan: metolazone (ZAROXOLYN) 5 MG tablet  Chronic congestive heart failure, unspecified congestive heart failure type ([place])  Weight gain  Plan to recheck wound in one week Concerning weight gain- add 5mg  of metolazone to his regimen and see cardiology in 48 hours   Signed Lamar Blinks, MD

## 2015-06-28 NOTE — Patient Instructions (Addendum)
You have an appt to see Dr. Einar Gip (for your heart) on Friday at 11:45 am.    Address: [address], [place], [place] [address] Phone: [phone]  For the time being please add one pill of metolazone 5mg  daily- continue this until you see Dr. Einar Gip  Meds ordered this encounter  Medications  . metolazone (ZAROXOLYN) 5 MG tablet    Sig: Take 1 tablet (5 mg total) by mouth daily.    Dispense:  30 tablet    Refill:  0   Do NOT miss this appointment with your heart doctor

## 2015-07-05 ENCOUNTER — Encounter: Payer: Self-pay | Admitting: Family Medicine

## 2015-07-05 ENCOUNTER — Ambulatory Visit (INDEPENDENT_AMBULATORY_CARE_PROVIDER_SITE_OTHER): Payer: BLUE CROSS/BLUE SHIELD | Admitting: Family Medicine

## 2015-07-05 VITALS — BP 140/96 | HR 79 | Temp 98.0°F | Resp 16 | Ht 66.0 in | Wt 355.0 lb

## 2015-07-05 DIAGNOSIS — S81801D Unspecified open wound, right lower leg, subsequent encounter: Secondary | ICD-10-CM

## 2015-07-05 DIAGNOSIS — R600 Localized edema: Secondary | ICD-10-CM

## 2015-07-05 MED ORDER — DOXYCYCLINE HYCLATE 100 MG PO TABS: 100.0000 mg | ORAL_TABLET | Freq: Two times a day (BID) | ORAL | Status: DC

## 2015-07-05 NOTE — Progress Notes (Signed)
[facility] [address], [place] [address] [phone]  Date:  07/05/2015   Name:  Blake Steadman Sr.   DOB:  Aug 18, 1950   MRN:  [id number]  PCP:  No primary care provider on file.    Chief Complaint: Follow-up and lower right leg   History of Present Illness:  Blake Ina Sr. is a 65 y.o. very pleasant male patient who presents with the following: This is a very fragile pt with CHF and also renal insufficiency.  He was seen by Dr. Einar Gip last week and I have seen the note but it is not yet scanned into Epic for review today.  We have been following his right LE poorly healing venous stasis ulcer, and had been making good progress.  However yesterday he admits that he ate 2 pieces of pepperoni pizza and then chicken, and that last night he has much worse swelling and weeping of his right leg.   He otherwise feels well and like his breathing is ok States that he does weight himself at home but does not remember what he weighed today  Wt Readings from Last 3 Encounters:  07/05/15 355 lb (161.027 kg)  06/28/15 358 lb (162.388 kg)  06/21/15 348 lb 6.4 oz (158.033 kg)   12/28- 357lb  Patient Active Problem List   Diagnosis Date Noted  . Acute on chronic diastolic heart failure ([place]) 06/04/2015  . H/O: CVA (cerebrovascular accident) 06/04/2015  . Anemia, iron deficiency 06/04/2015  . Acute on chronic systolic (congestive) heart failure ([place]) 05/29/2015  . CHF exacerbation ([place]) 05/29/2015  . Uncontrolled diabetes with stage 4 chronic kidney disease GFR 15-29 ([place]) 12/28/2014  . Type II diabetes mellitus with neurological manifestations ([place]) 12/12/2014  . Acute ischemic stroke ([place]) 12/02/2014  . Acute ischemic left MCA stroke ([place]) 12/02/2014  . Stroke ([place]) 03/24/2014  . Type 2 diabetes mellitus with complication ([place]) [id number]  . Essential hypertension, benign 03/24/2014  . HLD (hyperlipidemia) 03/24/2014  . CVA (cerebral infarction)  01/13/2014  . AKI (acute kidney injury) ([place]) 07/03/2013  . Hypernatremia 07/03/2013  . Morbid obesity ([place]) 07/03/2013  . Venous stasis ulcers ([place]) 07/03/2013  . DM (diabetes mellitus) type 2, uncontrolled, with ketoacidosis ([place]) 07/03/2013  . 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 Sellar or suprasellar mass 06/22/2013  . Acute renal failure superimposed on stage 4 chronic kidney disease ([place]) 06/22/2013  . Acute respiratory failure ([place]) 06/22/2013  . Seizure ([place]) 06/21/2013  . Altered mental status 06/21/2013  . DKA (diabetic ketoacidoses) ([place]) 06/21/2013  . Hypercarbia 06/21/2013  . OSA (obstructive sleep apnea) 05/25/2013    Past Medical History  Diagnosis Date  . Hypertension   . Multiple wounds     on lower legs - sees [name] Pain at the [facility]  . Hypercholesterolemia   . OSA on CPAP     uses cpap, setting of 5  . 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 Type II diabetes mellitus ([place])     uncontrolled/notes 12/02/2014  . Chronic kidney disease (CKD), stage III (moderate)     sees dr Florene Glen nephrology every 6-9 months  . CVA (cerebral vascular accident) [facility]) 12/2013    admitted in July 2015 for acute right-sided infarct/notes 12/02/2014  . Stroke ([place]) 12/02/2014    expressive aphasia  . Systolic CHF [facility])     Past Surgical History  Procedure Laterality Date  . Colonoscopy  6-7 yrs ago  . Colonoscopy N/A  04/05/2013    Procedure: COLONOSCOPY;  Surgeon: Juanita Craver, MD;  Location: [facility] ENDOSCOPY;  Service: Endoscopy;  Laterality: N/A;    Social History  Substance Use Topics  . Smoking status: Former Smoker -- 0.25 packs/day for 2 years    Types: Cigarettes    Quit date: 06/17/1970  . Smokeless tobacco: Never Used  . Alcohol Use: No    Family History  Problem Relation Age of Onset  . Diabetes Mother   . Hypertension Mother   . Diabetes Father   . Hypertension Father   . Diabetes Sister   . Diabetes Brother   . Asthma Son      No Known Allergies  Medication list has been reviewed and updated.  Current Outpatient Prescriptions on File Prior to Visit  Medication Sig Dispense Refill  . Albiglutide (TANZEUM) 30 MG PEN Inject 30 mg into the skin once a week. 1 each 2  . amLODipine (NORVASC) 10 MG tablet Take 1 tablet (10 mg total) by mouth daily. 30 tablet 0  . aspirin 325 MG tablet Take 325 mg by mouth daily.    Marland Kitchen atorvastatin (LIPITOR) 80 MG tablet Take 1 tablet (80 mg total) by mouth daily at 6 PM. 30 tablet 0  . B-D ULTRAFINE III SHORT PEN 31G X 8 MM MISC USE WITH SOLOSTAR AND NOVOLOG 100 each 1  . cabergoline (DOSTINEX) 0.5 MG tablet Take 0.5 tablets (0.25 mg total) by mouth 2 (two) times a week. 12 tablet 0  . clopidogrel (PLAVIX) 75 MG tablet Take 1 tablet (75 mg total) by mouth daily with breakfast. 30 tablet 5  . ferrous sulfate 325 (65 FE) MG tablet Take 1 tablet (325 mg total) by mouth 2 (two) times daily with a meal. 60 tablet 0  . furosemide (LASIX) 80 MG tablet Take 1 tablet (80 mg total) by mouth 2 (two) times daily. 60 tablet 0  . Insulin Glargine (LANTUS SOLOSTAR) 100 UNIT/ML Solostar Pen Inject 20 Units into the skin daily. 5 pen 6  . isosorbide-hydrALAZINE (BIDIL) 20-37.5 MG per tablet Take 2 tablets by mouth 3 (three) times daily. 180 tablet 0  . labetalol (NORMODYNE) 200 MG tablet TAKE 1 TABLET 3 TIMES A DAY. 90 tablet 4  . metolazone (ZAROXOLYN) 5 MG tablet Take 1 tablet (5 mg total) by mouth daily. 30 tablet 0  . potassium chloride SA (K-DUR,KLOR-CON) 20 MEQ tablet Take 2 tablets (40 mEq total) by mouth daily. 60 tablet 1  . Syringe, Disposable, 1 ML MISC Insulin syringes as needed to go with medications - lantus and tanzeum 200 each 11   No current facility-administered medications on file prior to visit.    Review of Systems:  As per HPI- otherwise negative.   Physical Examination: Filed Vitals:   07/05/15 1531  BP: 140/96  Pulse: 79  Temp: 98 F (36.7 C)  Resp: 16   Filed  Vitals:   07/05/15 1531  Height: 5\' 6"  (1.676 m)  Weight: 355 lb (161.027 kg)   Body mass index is 57.33 kg/(m^2). Ideal Body Weight: Weight in (lb) to have BMI = 25: 154.6  GEN: WDWN, NAD, Non-toxic, A & O x 3, very obese, appears to be in his usual state of health and is accompanied by his son today HEENT: Atraumatic, Normocephalic. Neck supple. No masses, No LAD. Ears and Nose: No external deformity. CV: RRR, No M/G/R. No JVD. No thrill. No extra heart sounds. PULM: CTA B, no wheezes, crackles, rhonchi. No retractions. No resp.  distress. No accessory muscle use. The left leg shows mild chronic woody appearing edema.  The right leg, which we have been treating, has new large bullae on the anterior and lateral aspect today that are full of clear serous fluid.  His usual venous stasis ulcer on the lateral aspect of the leg is unchanged.  The foot is normal.  The lower leg is in a compression sock that is too tight and is digging into the skin, likely making the skin sloughing worse NEURO Normal gait.  PSYCH: Normally interactive. Conversant. Not depressed or anxious appearing.  Calm demeanor.   Moved compression sock and dressing.  Drained serious fluid from blisters and bathed gently with damp gauze and some peroxide.  Used xeroform gauze, non -stick pads, tube gauze and cobain to dress the leg from ankle to knee Assessment and Plan: Wound of right leg, subsequent encounter - Plan: doxycycline (VIBRA-TABS) 100 MG tablet  Edema of right lower extremity - Plan: doxycycline (VIBRA-TABS) 100 MG tablet  Here today for a recheck of right lower extremity wound.  unfortunately he developed large weeping bullae on the leg yesterday which may be due to dietary indiscretion.  Dressed the leg and started doxycycline due to the large amount of non- intact skin.  He will come in tomorrow for a recheck.  Made appt with Windell Hummingbird, PA-C for tomorrow and would ask her to call me on my phone if she has any  questions about him tomorrow.   The patient and his family do not seem quite aware of the seriousness of Blake Bell's condition and the tenuous nature of his health right.  Discussed with them the distinct possibility that he will be re-hospitalized in the next few months, and encouraged them to discuss his wishes regarding his care if he should become sicker, and end of life issues   Signed Lamar Blinks, MD

## 2015-07-05 NOTE — Patient Instructions (Addendum)
Please come in tomorrow at 2:30 to see Windell Hummingbird, PA-C in the appointment center for wound care We are going to start you on doxycycline to help prevent any infection- take it twice a day for 10 days You can leave the current bandage in place until you are seen tomorrow  Please have a frank discussion in your family about your wishes concerning your care if you are too sick to speak for yourself or at the end of life. You should decide about what measures you would or would not want- specifically if you would wish to ever be put on a breathing machine or if you would like to be a "DNR," or do not recuisitate which means that if your body were to die doctors would not use CPR/ Drugs/ breathing machine to try and bring you back

## 2015-07-06 ENCOUNTER — Ambulatory Visit (INDEPENDENT_AMBULATORY_CARE_PROVIDER_SITE_OTHER): Payer: BLUE CROSS/BLUE SHIELD | Admitting: Physician Assistant

## 2015-07-06 ENCOUNTER — Encounter: Payer: Self-pay | Admitting: Physician Assistant

## 2015-07-06 VITALS — BP 146/86 | HR 76 | Temp 98.3°F | Resp 16 | Ht 66.75 in | Wt 351.0 lb

## 2015-07-06 DIAGNOSIS — R600 Localized edema: Secondary | ICD-10-CM | POA: Diagnosis not present

## 2015-07-06 DIAGNOSIS — L97911 Non-pressure chronic ulcer of unspecified part of right lower leg limited to breakdown of skin: Secondary | ICD-10-CM

## 2015-07-06 DIAGNOSIS — S81801D Unspecified open wound, right lower leg, subsequent encounter: Secondary | ICD-10-CM

## 2015-07-06 DIAGNOSIS — L97811 Non-pressure chronic ulcer of other part of right lower leg limited to breakdown of skin: Secondary | ICD-10-CM | POA: Diagnosis not present

## 2015-07-06 NOTE — Progress Notes (Signed)
Blake Ina Sr.  MRN: [id number] DOB: 06-28-1950  Subjective:  Pt presents to clinic for a wound check.  He feels like his leg is no worse.  The drsg is wet but he has not gotten it wet.  Patient Active Problem List   Diagnosis Date Noted  . Anemia, iron deficiency 06/04/2015  . Acute on chronic systolic (congestive) heart failure ([place]) 05/29/2015  . CHF exacerbation ([place]) 05/29/2015  . Uncontrolled diabetes with stage 4 chronic kidney disease GFR 15-29 ([place]) 12/28/2014  . Type II diabetes mellitus with neurological manifestations ([place]) 12/12/2014  . Acute ischemic left MCA stroke ([place]) 12/02/2014  . Type 2 diabetes mellitus with complication ([place]) [id number]  . Essential hypertension, benign 03/24/2014  . HLD (hyperlipidemia) 03/24/2014  . CVA (cerebral infarction) 01/13/2014  . AKI (acute kidney injury) ([place]) 07/03/2013  . Hypernatremia 07/03/2013  . Morbid obesity ([place]) 07/03/2013  . Venous stasis ulcers ([place]) 07/03/2013  . DM (diabetes mellitus) type 2, uncontrolled, with ketoacidosis ([place]) 07/03/2013  . 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 Sellar or suprasellar mass 06/22/2013  . Acute renal failure superimposed on stage 4 chronic kidney disease ([place]) 06/22/2013  . Acute respiratory failure ([place]) 06/22/2013  . Seizure ([place]) 06/21/2013  . Altered mental status 06/21/2013  . DKA (diabetic ketoacidoses) ([place]) 06/21/2013  . Hypercarbia 06/21/2013  . OSA (obstructive sleep apnea) 05/25/2013    Current Outpatient Prescriptions on File Prior to Visit  Medication Sig Dispense Refill  . Albiglutide (TANZEUM) 30 MG PEN Inject 30 mg into the skin once a week. 1 each 2  . amLODipine (NORVASC) 10 MG tablet Take 1 tablet (10 mg total) by mouth daily. 30 tablet 0  . aspirin 325 MG tablet Take 325 mg by mouth daily.    Marland Kitchen atorvastatin (LIPITOR) 80 MG tablet Take 1 tablet (80 mg total) by mouth daily at 6 PM. 30 tablet 0  . B-D ULTRAFINE III SHORT PEN 31G X 8 MM MISC USE WITH  SOLOSTAR AND NOVOLOG 100 each 1  . cabergoline (DOSTINEX) 0.5 MG tablet Take 0.5 tablets (0.25 mg total) by mouth 2 (two) times a week. 12 tablet 0  . clopidogrel (PLAVIX) 75 MG tablet Take 1 tablet (75 mg total) by mouth daily with breakfast. 30 tablet 5  . doxycycline (VIBRA-TABS) 100 MG tablet Take 1 tablet (100 mg total) by mouth 2 (two) times daily. 20 tablet 0  . ferrous sulfate 325 (65 FE) MG tablet Take 1 tablet (325 mg total) by mouth 2 (two) times daily with a meal. 60 tablet 0  . furosemide (LASIX) 80 MG tablet Take 1 tablet (80 mg total) by mouth 2 (two) times daily. 60 tablet 0  . Insulin Glargine (LANTUS SOLOSTAR) 100 UNIT/ML Solostar Pen Inject 20 Units into the skin daily. 5 pen 6  . isosorbide-hydrALAZINE (BIDIL) 20-37.5 MG per tablet Take 2 tablets by mouth 3 (three) times daily. 180 tablet 0  . labetalol (NORMODYNE) 200 MG tablet TAKE 1 TABLET 3 TIMES A DAY. 90 tablet 4  . metolazone (ZAROXOLYN) 5 MG tablet Take 1 tablet (5 mg total) by mouth daily. 30 tablet 0  . potassium chloride SA (K-DUR,KLOR-CON) 20 MEQ tablet Take 2 tablets (40 mEq total) by mouth daily. 60 tablet 1  . Syringe, Disposable, 1 ML MISC Insulin syringes as needed to go with medications - lantus and tanzeum 200 each 11   No current facility-administered medications on file prior to visit.    No Known Allergies  Review of Systems  Constitutional: Negative for fever and chills.  Skin: Positive for wound.   Objective:  BP 146/86 mmHg  Pulse 76  Temp(Src) 98.3 F (36.8 C) (Oral)  Resp 16  Ht 5' 6.75" (1.695 m)  Wt 351 lb (159.213 kg)  BMI 55.42 kg/m2  SpO2 96%  Physical Exam  Constitutional: He is oriented to person, place, and time and well-developed, well-nourished, and in no distress.  HENT:  Head: Normocephalic and atraumatic.  Right Ear: External ear normal.  Left Ear: External ear normal.  Eyes: Conjunctivae are normal.  Neck: Normal range of motion.  Pulmonary/Chest: Effort normal.    Neurological: He is alert and oriented to person, place, and time. Gait normal.  Skin: Skin is warm and dry.  Dressing was completely soaked with clear yellow fluid - Right lateral leg ulceration that is macerated - but no surrounding erythema. Anterior and posterior leg has maceration and serous fluid dripping from open vesicles - medial mid leg has bulla that is opened and serous fluid is drained out Leg has no erythema -   Legs was cleaned with soap and water, bulla was pressed on to expressed more serous fluid from the area.  Xeroform gauze placed on the bulla areas and then nonstick and cling were covered with ACE wrap.  The patient states the leg felt better with a dry dressing.  Psychiatric: Mood, memory, affect and judgment normal.    Assessment and Plan :  Wound of right leg, subsequent encounter  Edema of right lower extremity  Non-healing ulcer of lower leg, right, limited to breakdown of skin ([place])   Pt will continue his medications - they will change from Lasix to Demedex as told by Dr Einar Gip.  He will elevate the leg most of the day.  He will recheck tomorrow for a dressing change.  Windell Hummingbird PA-C  [facility] 07/06/2015 3:58 PM

## 2015-07-07 ENCOUNTER — Ambulatory Visit (INDEPENDENT_AMBULATORY_CARE_PROVIDER_SITE_OTHER): Payer: BLUE CROSS/BLUE SHIELD | Admitting: Physician Assistant

## 2015-07-07 VITALS — BP 130/68 | HR 73 | Temp 98.3°F | Resp 18 | Ht 66.0 in | Wt 350.0 lb

## 2015-07-07 DIAGNOSIS — S81801D Unspecified open wound, right lower leg, subsequent encounter: Secondary | ICD-10-CM | POA: Diagnosis not present

## 2015-07-07 DIAGNOSIS — L97811 Non-pressure chronic ulcer of other part of right lower leg limited to breakdown of skin: Secondary | ICD-10-CM

## 2015-07-07 DIAGNOSIS — R600 Localized edema: Secondary | ICD-10-CM

## 2015-07-07 DIAGNOSIS — L97911 Non-pressure chronic ulcer of unspecified part of right lower leg limited to breakdown of skin: Secondary | ICD-10-CM

## 2015-07-07 DIAGNOSIS — S91109A Unspecified open wound of unspecified toe(s) without damage to nail, initial encounter: Secondary | ICD-10-CM

## 2015-07-07 MED ORDER — MUPIROCIN 2 % EX OINT: 1.0000 "application " | TOPICAL_OINTMENT | Freq: Two times a day (BID) | CUTANEOUS | Status: DC

## 2015-07-07 NOTE — Progress Notes (Signed)
Blake Ina Sr.  MRN: [id number] DOB: [birth year]    Pt presents to clinic for recheck of wound care.  He feels that leg feels better today - they have noticed that the foot is more swollen - son feels like it is twice the size since he stopped the compression sock.  He finally got the doxy yesterday and is tolerating it ok.  They tried to fill the Demedex but it was not at the pharmacy - they have not called cardiology to determine what they should do about his appt today and the fact that today was the recheck for the demedex starting last week.  Pt was trimming his toenails and he clipped his skin earlier today - it has been bleeding since then.  Patient Active Problem List   Diagnosis Date Noted  . Anemia, iron deficiency 06/04/2015  . Acute on chronic systolic (congestive) heart failure ([place]) 05/29/2015  . CHF exacerbation ([place]) 05/29/2015  . Uncontrolled diabetes with stage 4 chronic kidney disease GFR 15-29 ([place]) 12/28/2014  . Type II diabetes mellitus with neurological manifestations ([place]) 12/12/2014  . Acute ischemic left MCA stroke ([place]) 12/02/2014  . Type 2 diabetes mellitus with complication ([place]) [id number]  . Essential hypertension, benign 03/24/2014  . HLD (hyperlipidemia) 03/24/2014  . CVA (cerebral infarction) 01/13/2014  . AKI (acute kidney injury) ([place]) 07/03/2013  . Hypernatremia 07/03/2013  . Morbid obesity ([place]) 07/03/2013  . Venous stasis ulcers ([place]) 07/03/2013  . DM (diabetes mellitus) type 2, uncontrolled, with ketoacidosis ([place]) 07/03/2013  . 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 Sellar or suprasellar mass 06/22/2013  . Acute renal failure superimposed on stage 4 chronic kidney disease ([place]) 06/22/2013  . Acute respiratory failure ([place]) 06/22/2013  . Seizure ([place]) 06/21/2013  . Altered mental status 06/21/2013  . DKA (diabetic ketoacidoses) ([place]) 06/21/2013  . Hypercarbia 06/21/2013  . OSA (obstructive sleep apnea) 05/25/2013    Current Outpatient  Prescriptions on File Prior to Visit  Medication Sig Dispense Refill  . Albiglutide (TANZEUM) 30 MG PEN Inject 30 mg into the skin once a week. 1 each 2  . amLODipine (NORVASC) 10 MG tablet Take 1 tablet (10 mg total) by mouth daily. 30 tablet 0  . aspirin 325 MG tablet Take 325 mg by mouth daily.    Marland Kitchen atorvastatin (LIPITOR) 80 MG tablet Take 1 tablet (80 mg total) by mouth daily at 6 PM. 30 tablet 0  . B-D ULTRAFINE III SHORT PEN 31G X 8 MM MISC USE WITH SOLOSTAR AND NOVOLOG 100 each 1  . cabergoline (DOSTINEX) 0.5 MG tablet Take 0.5 tablets (0.25 mg total) by mouth 2 (two) times a week. 12 tablet 0  . clopidogrel (PLAVIX) 75 MG tablet Take 1 tablet (75 mg total) by mouth daily with breakfast. 30 tablet 5  . doxycycline (VIBRA-TABS) 100 MG tablet Take 1 tablet (100 mg total) by mouth 2 (two) times daily. 20 tablet 0  . escitalopram (LEXAPRO) 10 MG tablet TK 1 T PO QD  1  . ferrous sulfate 325 (65 FE) MG tablet Take 1 tablet (325 mg total) by mouth 2 (two) times daily with a meal. 60 tablet 0  . furosemide (LASIX) 80 MG tablet Take 1 tablet (80 mg total) by mouth 2 (two) times daily. 60 tablet 0  . Insulin Glargine (LANTUS SOLOSTAR) 100 UNIT/ML Solostar Pen Inject 20 Units into the skin daily. 5 pen 6  . isosorbide-hydrALAZINE (BIDIL) 20-37.5 MG per tablet Take 2 tablets by mouth 3 (three) times daily.  180 tablet 0  . labetalol (NORMODYNE) 300 MG tablet Take 300 mg by mouth 3 (three) times daily.  11  . metolazone (ZAROXOLYN) 5 MG tablet Take 1 tablet (5 mg total) by mouth daily. 30 tablet 0  . potassium chloride SA (K-DUR,KLOR-CON) 20 MEQ tablet Take 2 tablets (40 mEq total) by mouth daily. 60 tablet 1  . Syringe, Disposable, 1 ML MISC Insulin syringes as needed to go with medications - lantus and tanzeum 200 each 11  . torsemide (DEMADEX) 20 MG tablet Take 20 mg by mouth daily.     No current facility-administered medications on file prior to visit.    No Known Allergies  Review of  Systems  Constitutional: Negative for fever and chills.  Gastrointestinal: Negative for nausea.  Skin: Positive for wound.   Objective:  BP 130/68 mmHg  Pulse 73  Temp(Src) 98.3 F (36.8 C) (Oral)  Resp 18  Ht 5\' 6"  (1.676 m)  Wt 350 lb (158.759 kg)  BMI 56.52 kg/m2  SpO2 96%  Physical Exam  Constitutional: He is oriented to person, place, and time and well-developed, well-nourished, and in no distress.  HENT:  Head: Normocephalic and atraumatic.  Right Ear: External ear normal.  Left Ear: External ear normal.  Eyes: Conjunctivae are normal.  Neck: Normal range of motion.  Pulmonary/Chest: Effort normal.  Neurological: He is alert and oriented to person, place, and time. Gait normal.  Skin: Skin is warm and dry.  drsg removed - the drsg was dry today - the skin did not appear macerated like it did yesterday - there are no further bulla development today - the loose skin is tearing off in some places - there is no erythema present  Laceration on right great toenail with flap of skin  Right foot swollen in comparison to left foot - unable to feel pulse due to swelling but distal right great toe is bleeding with new laceration.  Psychiatric: Mood, memory, affect and judgment normal.   Unna boot dressing appied just proximal to toes up to just inferior to knee with an ACE wrap cover. Assessment and Plan :  Wound, open, toe, initial encounter - Plan: mupirocin ointment (BACTROBAN) 2 %, Care order/instruction  Wound of right leg, subsequent encounter  Edema of right lower extremity  Non-healing ulcer of lower leg, right, limited to breakdown of skin ([place])   Recheck on Tuesday for replacement of unna boot - he has wound care appt on 1/30 and we will do this for patient until that appt.  Windell Hummingbird PA-C  [facility] 07/07/2015 1:36 PM

## 2015-07-07 NOTE — Patient Instructions (Signed)
Keep leg dry and elevated as much as possible  Use antibiotic ointment on the toe wound

## 2015-07-10 ENCOUNTER — Other Ambulatory Visit: Payer: Self-pay | Admitting: Family Medicine

## 2015-07-11 ENCOUNTER — Ambulatory Visit (INDEPENDENT_AMBULATORY_CARE_PROVIDER_SITE_OTHER): Payer: BLUE CROSS/BLUE SHIELD | Admitting: Physician Assistant

## 2015-07-11 VITALS — BP 167/90 | HR 61 | Temp 98.0°F | Resp 20

## 2015-07-11 DIAGNOSIS — R600 Localized edema: Secondary | ICD-10-CM

## 2015-07-11 DIAGNOSIS — S91109A Unspecified open wound of unspecified toe(s) without damage to nail, initial encounter: Secondary | ICD-10-CM

## 2015-07-11 DIAGNOSIS — S81801D Unspecified open wound, right lower leg, subsequent encounter: Secondary | ICD-10-CM

## 2015-07-11 DIAGNOSIS — L97811 Non-pressure chronic ulcer of other part of right lower leg limited to breakdown of skin: Secondary | ICD-10-CM

## 2015-07-11 DIAGNOSIS — L97911 Non-pressure chronic ulcer of unspecified part of right lower leg limited to breakdown of skin: Secondary | ICD-10-CM

## 2015-07-11 NOTE — Progress Notes (Signed)
Blake Ina Sr.  MRN: [id number] DOB: 03-Aug-1950  Subjective:  Pt presents to clinic for a UNNA boot change.  The leg feels better.  He feels like his foot is less swollen.  He has been trying to keep it elevated.  He has not switched to Demedex yet but they have the medication.  Patient Active Problem List   Diagnosis Date Noted  . Anemia, iron deficiency 06/04/2015  . Acute on chronic systolic (congestive) heart failure ([place]) 05/29/2015  . CHF exacerbation ([place]) 05/29/2015  . Uncontrolled diabetes with stage 4 chronic kidney disease GFR 15-29 ([place]) 12/28/2014  . Type II diabetes mellitus with neurological manifestations ([place]) 12/12/2014  . Acute ischemic left MCA stroke ([place]) 12/02/2014  . Type 2 diabetes mellitus with complication ([place]) [id number]  . Essential hypertension, benign 03/24/2014  . HLD (hyperlipidemia) 03/24/2014  . CVA (cerebral infarction) 01/13/2014  . AKI (acute kidney injury) ([place]) 07/03/2013  . Hypernatremia 07/03/2013  . Morbid obesity ([place]) 07/03/2013  . Venous stasis ulcers ([place]) 07/03/2013  . DM (diabetes mellitus) type 2, uncontrolled, with ketoacidosis ([place]) 07/03/2013  . 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 Sellar or suprasellar mass 06/22/2013  . Acute renal failure superimposed on stage 4 chronic kidney disease ([place]) 06/22/2013  . Acute respiratory failure ([place]) 06/22/2013  . Seizure ([place]) 06/21/2013  . Altered mental status 06/21/2013  . DKA (diabetic ketoacidoses) ([place]) 06/21/2013  . Hypercarbia 06/21/2013  . OSA (obstructive sleep apnea) 05/25/2013    Current Outpatient Prescriptions on File Prior to Visit  Medication Sig Dispense Refill  . Albiglutide (TANZEUM) 30 MG PEN Inject 30 mg into the skin once a week. 1 each 2  . amLODipine (NORVASC) 10 MG tablet Take 1 tablet (10 mg total) by mouth daily. 30 tablet 0  . aspirin 325 MG tablet Take 325 mg by mouth daily.    Marland Kitchen atorvastatin (LIPITOR) 80 MG tablet Take 1 tablet (80 mg total) by  mouth daily at 6 PM. 30 tablet 0  . B-D ULTRAFINE III SHORT PEN 31G X 8 MM MISC USE WITH SOLOSTAR AND NOVOLOG 100 each 1  . cabergoline (DOSTINEX) 0.5 MG tablet Take 0.5 tablets (0.25 mg total) by mouth 2 (two) times a week. 12 tablet 0  . clopidogrel (PLAVIX) 75 MG tablet Take 1 tablet (75 mg total) by mouth daily with breakfast. 30 tablet 5  . doxycycline (VIBRA-TABS) 100 MG tablet Take 1 tablet (100 mg total) by mouth 2 (two) times daily. 20 tablet 0  . escitalopram (LEXAPRO) 10 MG tablet TK 1 T PO QD  1  . ferrous sulfate 325 (65 FE) MG tablet Take 1 tablet (325 mg total) by mouth 2 (two) times daily with a meal. 60 tablet 0  . furosemide (LASIX) 80 MG tablet Take 1 tablet (80 mg total) by mouth 2 (two) times daily. 60 tablet 0  . Insulin Glargine (LANTUS SOLOSTAR) 100 UNIT/ML Solostar Pen Inject 20 Units into the skin daily. 5 pen 6  . isosorbide-hydrALAZINE (BIDIL) 20-37.5 MG per tablet Take 2 tablets by mouth 3 (three) times daily. 180 tablet 0  . labetalol (NORMODYNE) 300 MG tablet Take 300 mg by mouth 3 (three) times daily.  11  . metolazone (ZAROXOLYN) 5 MG tablet Take 1 tablet (5 mg total) by mouth daily. 30 tablet 0  . mupirocin ointment (BACTROBAN) 2 % Place 1 application into the nose 2 (two) times daily. 22 g 0  . potassium chloride SA (K-DUR,KLOR-CON) 20 MEQ tablet Take 2 tablets (40  mEq total) by mouth daily. 60 tablet 1  . Syringe, Disposable, 1 ML MISC Insulin syringes as needed to go with medications - lantus and tanzeum 200 each 11  . torsemide (DEMADEX) 20 MG tablet Take 20 mg by mouth daily.     No current facility-administered medications on file prior to visit.    No Known Allergies  Review of Systems Objective:  BP 167/90 mmHg  Pulse 61  Temp(Src) 98 F (36.7 C) (Oral)  Resp 20  SpO2 94%  Physical Exam  Constitutional: He is oriented to person, place, and time and well-developed, well-nourished, and in no distress.  HENT:  Head: Normocephalic and  atraumatic.  Right Ear: External ear normal.  Left Ear: External ear normal.  Eyes: Conjunctivae are normal.  Neck: Normal range of motion.  Pulmonary/Chest: Effort normal.  Neurological: He is alert and oriented to person, place, and time. Gait normal.  Skin: Skin is warm and dry.  Unna boot removed - decreased swelling in his right foot - no erythema present - no more bulla present and no skin weeping -   Psychiatric: Mood, memory, affect and judgment normal.   Unna boot removed and then cleaned with soap and water.  Unna boot placed on the right leg and ace wrap.  Assessment and Plan :  Wound, open, toe, initial encounter  Wound of right leg, subsequent encounter  Edema of right lower extremity  Non-healing ulcer of lower leg, right, limited to breakdown of skin ([place])   Pt will f/u in a week with wound care - if he has any swelling or increased pain he will return to clinic before his appt on 1/30.  He will continue to elevated his leg.  Windell Hummingbird PA-C  [facility] 07/11/2015 2:44 PM

## 2015-07-17 ENCOUNTER — Encounter (HOSPITAL_BASED_OUTPATIENT_CLINIC_OR_DEPARTMENT_OTHER): Payer: BLUE CROSS/BLUE SHIELD | Attending: Internal Medicine

## 2015-07-17 DIAGNOSIS — I87311 Chronic venous hypertension (idiopathic) with ulcer of right lower extremity: Secondary | ICD-10-CM | POA: Insufficient documentation

## 2015-07-17 DIAGNOSIS — L97819 Non-pressure chronic ulcer of other part of right lower leg with unspecified severity: Secondary | ICD-10-CM | POA: Diagnosis not present

## 2015-07-17 DIAGNOSIS — I10 Essential (primary) hypertension: Secondary | ICD-10-CM | POA: Diagnosis not present

## 2015-07-17 DIAGNOSIS — I890 Lymphedema, not elsewhere classified: Secondary | ICD-10-CM | POA: Insufficient documentation

## 2015-07-17 DIAGNOSIS — E11622 Type 2 diabetes mellitus with other skin ulcer: Secondary | ICD-10-CM | POA: Insufficient documentation

## 2015-07-19 ENCOUNTER — Telehealth: Payer: Self-pay

## 2015-07-19 NOTE — Telephone Encounter (Signed)
Completed PA for Bidil on covermymeds. Pending.

## 2015-07-20 NOTE — Telephone Encounter (Signed)
Received notice from [place] that this has already been approved from 07/17/15 to 06/16/2038. Notified pharm.

## 2015-07-24 ENCOUNTER — Encounter (HOSPITAL_BASED_OUTPATIENT_CLINIC_OR_DEPARTMENT_OTHER): Payer: BLUE CROSS/BLUE SHIELD

## 2015-07-25 ENCOUNTER — Encounter (HOSPITAL_BASED_OUTPATIENT_CLINIC_OR_DEPARTMENT_OTHER): Payer: BLUE CROSS/BLUE SHIELD | Attending: Internal Medicine

## 2015-07-25 ENCOUNTER — Other Ambulatory Visit: Payer: Self-pay | Admitting: Family Medicine

## 2015-07-25 DIAGNOSIS — I87311 Chronic venous hypertension (idiopathic) with ulcer of right lower extremity: Secondary | ICD-10-CM | POA: Diagnosis present

## 2015-07-25 DIAGNOSIS — I890 Lymphedema, not elsewhere classified: Secondary | ICD-10-CM | POA: Insufficient documentation

## 2015-07-25 DIAGNOSIS — L97811 Non-pressure chronic ulcer of other part of right lower leg limited to breakdown of skin: Secondary | ICD-10-CM | POA: Insufficient documentation

## 2015-07-25 DIAGNOSIS — G4730 Sleep apnea, unspecified: Secondary | ICD-10-CM | POA: Diagnosis not present

## 2015-07-25 DIAGNOSIS — E11622 Type 2 diabetes mellitus with other skin ulcer: Secondary | ICD-10-CM | POA: Diagnosis not present

## 2015-07-25 DIAGNOSIS — I130 Hypertensive heart and chronic kidney disease with heart failure and stage 1 through stage 4 chronic kidney disease, or unspecified chronic kidney disease: Secondary | ICD-10-CM | POA: Diagnosis not present

## 2015-07-25 DIAGNOSIS — E1122 Type 2 diabetes mellitus with diabetic chronic kidney disease: Secondary | ICD-10-CM | POA: Insufficient documentation

## 2015-07-25 DIAGNOSIS — N184 Chronic kidney disease, stage 4 (severe): Secondary | ICD-10-CM | POA: Insufficient documentation

## 2015-07-25 DIAGNOSIS — Z8673 Personal history of transient ischemic attack (TIA), and cerebral infarction without residual deficits: Secondary | ICD-10-CM | POA: Diagnosis not present

## 2015-07-25 DIAGNOSIS — I5032 Chronic diastolic (congestive) heart failure: Secondary | ICD-10-CM | POA: Diagnosis not present

## 2015-07-27 NOTE — Telephone Encounter (Signed)
Dr Lorelei Pont, we have been seeing pt reg for wound care recently but it looks like he usually sees you for more chronic issues. We haven't discussed chol recently that I see, but pt's last lipid test in [facility] very good (ordered by hospitalist 05/30/15). Do you want to give RFs?

## 2015-08-01 ENCOUNTER — Other Ambulatory Visit: Payer: Self-pay | Admitting: Family Medicine

## 2015-08-01 DIAGNOSIS — I87311 Chronic venous hypertension (idiopathic) with ulcer of right lower extremity: Secondary | ICD-10-CM | POA: Diagnosis not present

## 2015-08-03 NOTE — Telephone Encounter (Signed)
Blake Bell, it looks like Dr Lorelei Pont gave pt a 30 day supply and wanted him to see cardiologist? You have most recently seen pt for wound and assessed edema. Do you want to deny this RF or give one RF w/note to see cardiologist for more? It looks like pt may be due for f/up on wound anyway?

## 2015-08-07 ENCOUNTER — Other Ambulatory Visit: Payer: Self-pay

## 2015-08-07 DIAGNOSIS — Z01810 Encounter for preprocedural cardiovascular examination: Secondary | ICD-10-CM

## 2015-08-07 DIAGNOSIS — N184 Chronic kidney disease, stage 4 (severe): Secondary | ICD-10-CM

## 2015-08-08 ENCOUNTER — Encounter: Payer: Self-pay | Admitting: Physician Assistant

## 2015-08-08 DIAGNOSIS — I5032 Chronic diastolic (congestive) heart failure: Secondary | ICD-10-CM | POA: Insufficient documentation

## 2015-08-08 DIAGNOSIS — I87311 Chronic venous hypertension (idiopathic) with ulcer of right lower extremity: Secondary | ICD-10-CM | POA: Diagnosis not present

## 2015-08-08 NOTE — Telephone Encounter (Signed)
This request needs to go to Dr Mila Palmer office - I did not decline because I know that the pharmacy does not see our notes and if I deny it the patient will not call his office - can we please call the pharmacy and have them fax the request to Dr Nadyne Coombes.

## 2015-08-10 NOTE — Telephone Encounter (Signed)
Called [facility] and asked them to send this RF req to Dr Irven Shelling office. Denied from our office w/note also.

## 2015-08-16 ENCOUNTER — Encounter (HOSPITAL_BASED_OUTPATIENT_CLINIC_OR_DEPARTMENT_OTHER): Payer: BLUE CROSS/BLUE SHIELD | Attending: Surgery

## 2015-08-16 DIAGNOSIS — I890 Lymphedema, not elsewhere classified: Secondary | ICD-10-CM | POA: Insufficient documentation

## 2015-08-16 DIAGNOSIS — E11622 Type 2 diabetes mellitus with other skin ulcer: Secondary | ICD-10-CM | POA: Diagnosis not present

## 2015-08-16 DIAGNOSIS — I10 Essential (primary) hypertension: Secondary | ICD-10-CM | POA: Insufficient documentation

## 2015-08-16 DIAGNOSIS — L97811 Non-pressure chronic ulcer of other part of right lower leg limited to breakdown of skin: Secondary | ICD-10-CM | POA: Insufficient documentation

## 2015-08-16 DIAGNOSIS — I87311 Chronic venous hypertension (idiopathic) with ulcer of right lower extremity: Secondary | ICD-10-CM | POA: Diagnosis present

## 2015-08-23 DIAGNOSIS — I87311 Chronic venous hypertension (idiopathic) with ulcer of right lower extremity: Secondary | ICD-10-CM | POA: Diagnosis not present

## 2015-08-24 ENCOUNTER — Ambulatory Visit (HOSPITAL_COMMUNITY)
Admission: RE | Admit: 2015-08-24 | Discharge: 2015-08-24 | Disposition: A | Discharge status: Home / Self Care | Payer: BLUE CROSS/BLUE SHIELD | Source: Ambulatory Visit | Attending: Surgery | Admitting: Surgery

## 2015-08-24 ENCOUNTER — Other Ambulatory Visit: Payer: Self-pay | Admitting: Surgery

## 2015-08-24 DIAGNOSIS — I130 Hypertensive heart and chronic kidney disease with heart failure and stage 1 through stage 4 chronic kidney disease, or unspecified chronic kidney disease: Secondary | ICD-10-CM | POA: Insufficient documentation

## 2015-08-24 DIAGNOSIS — L97909 Non-pressure chronic ulcer of unspecified part of unspecified lower leg with unspecified severity: Secondary | ICD-10-CM

## 2015-08-24 DIAGNOSIS — E1122 Type 2 diabetes mellitus with diabetic chronic kidney disease: Secondary | ICD-10-CM | POA: Insufficient documentation

## 2015-08-24 DIAGNOSIS — N183 Chronic kidney disease, stage 3 (moderate): Secondary | ICD-10-CM | POA: Diagnosis not present

## 2015-08-24 DIAGNOSIS — E7800 Pure hypercholesterolemia, unspecified: Secondary | ICD-10-CM | POA: Diagnosis not present

## 2015-08-24 DIAGNOSIS — I5020 Unspecified systolic (congestive) heart failure: Secondary | ICD-10-CM | POA: Insufficient documentation

## 2015-08-25 DIAGNOSIS — I87311 Chronic venous hypertension (idiopathic) with ulcer of right lower extremity: Secondary | ICD-10-CM | POA: Diagnosis not present

## 2015-08-29 ENCOUNTER — Other Ambulatory Visit: Payer: Self-pay | Admitting: Family Medicine

## 2015-08-29 DIAGNOSIS — I87311 Chronic venous hypertension (idiopathic) with ulcer of right lower extremity: Secondary | ICD-10-CM | POA: Diagnosis not present

## 2015-09-01 NOTE — Telephone Encounter (Signed)
I only saw pt once 12/2014, he will need OV for any med refill and we are not pt's PCP.  Pt most recently has been seen by [facility] - I highly recommend they call over to [facility] and establish with her - I imagine pt would prefer having a reg appt time. Don't need to transfer records or anything since in the same system. And could still come to [facility] walk in for urgent matters.

## 2015-09-01 NOTE — Telephone Encounter (Signed)
Dr Brigitte Pulse, do you want to give RFs? I didn't know if there were any labs to be checked, and don't see this med discussed recently. It does look like we have been Rxing it for the pt though.

## 2015-09-02 ENCOUNTER — Telehealth: Payer: Self-pay | Admitting: *Deleted

## 2015-09-04 ENCOUNTER — Other Ambulatory Visit: Payer: Self-pay

## 2015-09-04 MED ORDER — CLOPIDOGREL BISULFATE 75 MG PO TABS: 75.0000 mg | ORAL_TABLET | Freq: Every day | ORAL | Status: DC

## 2015-09-06 ENCOUNTER — Encounter (HOSPITAL_COMMUNITY): Payer: BLUE CROSS/BLUE SHIELD

## 2015-09-06 ENCOUNTER — Encounter: Payer: Self-pay | Admitting: Vascular Surgery

## 2015-09-06 DIAGNOSIS — I87311 Chronic venous hypertension (idiopathic) with ulcer of right lower extremity: Secondary | ICD-10-CM | POA: Diagnosis not present

## 2015-09-08 ENCOUNTER — Encounter (HOSPITAL_COMMUNITY): Payer: BLUE CROSS/BLUE SHIELD

## 2015-09-08 ENCOUNTER — Ambulatory Visit: Payer: BLUE CROSS/BLUE SHIELD | Admitting: Vascular Surgery

## 2015-09-08 ENCOUNTER — Other Ambulatory Visit (HOSPITAL_COMMUNITY): Payer: BLUE CROSS/BLUE SHIELD

## 2015-09-12 ENCOUNTER — Ambulatory Visit (INDEPENDENT_AMBULATORY_CARE_PROVIDER_SITE_OTHER)
Admission: RE | Admit: 2015-09-12 | Discharge: 2015-09-12 | Disposition: A | Discharge status: Home / Self Care | Payer: BLUE CROSS/BLUE SHIELD | Source: Ambulatory Visit | Attending: Vascular Surgery | Admitting: Vascular Surgery

## 2015-09-12 ENCOUNTER — Ambulatory Visit (INDEPENDENT_AMBULATORY_CARE_PROVIDER_SITE_OTHER): Payer: BLUE CROSS/BLUE SHIELD | Admitting: Vascular Surgery

## 2015-09-12 ENCOUNTER — Other Ambulatory Visit: Payer: Self-pay

## 2015-09-12 ENCOUNTER — Encounter: Payer: Self-pay | Admitting: Vascular Surgery

## 2015-09-12 ENCOUNTER — Ambulatory Visit (HOSPITAL_COMMUNITY)
Admission: RE | Admit: 2015-09-12 | Discharge: 2015-09-12 | Disposition: A | Discharge status: Home / Self Care | Payer: BLUE CROSS/BLUE SHIELD | Source: Ambulatory Visit | Attending: Vascular Surgery | Admitting: Vascular Surgery

## 2015-09-12 VITALS — BP 171/94 | HR 69 | Resp 18 | Ht 69.5 in | Wt 356.0 lb

## 2015-09-12 DIAGNOSIS — I130 Hypertensive heart and chronic kidney disease with heart failure and stage 1 through stage 4 chronic kidney disease, or unspecified chronic kidney disease: Secondary | ICD-10-CM | POA: Insufficient documentation

## 2015-09-12 DIAGNOSIS — N184 Chronic kidney disease, stage 4 (severe): Secondary | ICD-10-CM

## 2015-09-12 DIAGNOSIS — E1122 Type 2 diabetes mellitus with diabetic chronic kidney disease: Secondary | ICD-10-CM | POA: Insufficient documentation

## 2015-09-12 DIAGNOSIS — E7800 Pure hypercholesterolemia, unspecified: Secondary | ICD-10-CM | POA: Insufficient documentation

## 2015-09-12 DIAGNOSIS — Z01810 Encounter for preprocedural cardiovascular examination: Secondary | ICD-10-CM | POA: Diagnosis not present

## 2015-09-12 DIAGNOSIS — I5020 Unspecified systolic (congestive) heart failure: Secondary | ICD-10-CM | POA: Diagnosis not present

## 2015-09-12 DIAGNOSIS — I82711 Chronic embolism and thrombosis of superficial veins of right upper extremity: Secondary | ICD-10-CM | POA: Diagnosis not present

## 2015-09-12 NOTE — Progress Notes (Signed)
Subjective:     Patient ID: Blake Ina Sr., male   DOB: 02-28-1951, 65 y.o.   MRN: [id number]  HPI This 65 year old male was referred by [name] for evaluation of vascular access. Patient has stage IV chronic kidney disease with recent creatinine of 3.74. He has had chronic renal insufficiency for many years. He states that he has 3 brothers who possibly may donate a kidney for transplant.  He is right-handed. He has never been on hemodialysis.  Past Medical History  Diagnosis Date  . Hypertension   . Multiple wounds     on lower legs - sees [name] Pain at the [facility]  . Hypercholesterolemia   . OSA on CPAP     uses cpap, setting of 5  . 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 Type II diabetes mellitus ([place])     uncontrolled/notes 12/02/2014  . Chronic kidney disease (CKD), stage III (moderate)     sees dr Florene Glen nephrology every 6-9 months  . CVA (cerebral vascular accident) [facility]) 12/2013    admitted in July 2015 for acute right-sided infarct/notes 12/02/2014  . Stroke ([place]) 12/02/2014    expressive aphasia  . Systolic CHF [facility])     Social History  Substance Use Topics  . Smoking status: Former Smoker -- 0.25 packs/day for 2 years    Types: Cigarettes    Quit date: 06/17/1970  . Smokeless tobacco: Never Used  . Alcohol Use: No    Family History  Problem Relation Age of Onset  . Diabetes Mother   . Hypertension Mother   . Diabetes Father   . Hypertension Father   . Diabetes Sister   . Diabetes Brother   . Asthma Son     No Known Allergies   Current outpatient prescriptions:  .  Albiglutide (TANZEUM) 30 MG PEN, Inject 30 mg into the skin once a week., Disp: 1 each, Rfl: 2 .  amLODipine (NORVASC) 10 MG tablet, Take 1 tablet (10 mg total) by mouth daily., Disp: 30 tablet, Rfl: 0 .  aspirin 325 MG tablet, Take 325 mg by mouth daily., Disp: , Rfl:  .  atorvastatin (LIPITOR) 80 MG tablet, TAKE 1 TABLET AT 6PM., Disp: 90 tablet, Rfl:  3 .  B-D ULTRAFINE III SHORT PEN 31G X 8 MM MISC, USE WITH SOLOSTAR AND NOVOLOG, Disp: 100 each, Rfl: 1 .  BIDIL 20-37.5 MG tablet, TAKE (2) TABLETS THREE TIMES DAILY., Disp: 540 tablet, Rfl: 1 .  clopidogrel (PLAVIX) 75 MG tablet, Take 1 tablet (75 mg total) by mouth daily with breakfast., Disp: 30 tablet, Rfl: 3 .  doxycycline (VIBRA-TABS) 100 MG tablet, Take 1 tablet (100 mg total) by mouth 2 (two) times daily., Disp: 20 tablet, Rfl: 0 .  escitalopram (LEXAPRO) 10 MG tablet, TK 1 T PO QD, Disp: , Rfl: 1 .  ferrous sulfate 325 (65 FE) MG tablet, Take 1 tablet (325 mg total) by mouth 2 (two) times daily with a meal., Disp: 60 tablet, Rfl: 0 .  furosemide (LASIX) 80 MG tablet, Take 1 tablet (80 mg total) by mouth 2 (two) times daily., Disp: 60 tablet, Rfl: 0 .  Insulin Glargine (LANTUS SOLOSTAR) 100 UNIT/ML Solostar Pen, Inject 20 Units into the skin daily., Disp: 5 pen, Rfl: 6 .  labetalol (NORMODYNE) 300 MG tablet, Take 300 mg by mouth 3 (three) times daily., Disp: , Rfl: 11 .  metolazone (ZAROXOLYN) 5 MG tablet, Take 1 tablet (5 mg total) by  mouth daily., Disp: 30 tablet, Rfl: 0 .  mupirocin ointment (BACTROBAN) 2 %, Place 1 application into the nose 2 (two) times daily., Disp: 22 g, Rfl: 0 .  potassium chloride SA (K-DUR,KLOR-CON) 20 MEQ tablet, Take 2 tablets (40 mEq total) by mouth daily., Disp: 60 tablet, Rfl: 1 .  Syringe, Disposable, 1 ML MISC, Insulin syringes as needed to go with medications - lantus and tanzeum, Disp: 200 each, Rfl: 11 .  cabergoline (DOSTINEX) 0.5 MG tablet, Take 0.5 tablets (0.25 mg total) by mouth 2 (two) times a week. (Patient not taking: Reported on 09/12/2015), Disp: 12 tablet, Rfl: 0 .  torsemide (DEMADEX) 20 MG tablet, Take 20 mg by mouth daily. Reported on 09/12/2015, Disp: , Rfl:   Filed Vitals:   09/12/15 1220  BP: 171/94  Pulse: 69  Resp: 18  Height: 5' 9.5" (1.765 m)  Weight: 356 lb (161.481 kg)  SpO2: 93%    Body mass index is 51.84  kg/(m^2).           Review of Systems Patient has morbid obesity. Patient has history of hyper tension, hypercholesterolemia, diabetes mellitus, and previous stroke causing a aphasia. Other systems negative and complete review of systems -see history of present illness     Objective:   Physical Exam BP 171/94 mmHg  Pulse 69  Resp 18  Ht 5' 9.5" (1.765 m)  Wt 356 lb (161.481 kg)  BMI 51.84 kg/m2  SpO2 93%  Gen.-alert and oriented x3 in no apparent distress -morbidly obese HEENT normal for age Lungs no rhonchi or wheezing Cardiovascular regular rhythm no murmurs carotid pulses 3+ palpable no bruits audible Abdomen soft nontender no palpable masses Musculoskeletal free of  major deformities Skin clear -no rashes Neurologic normal except speech is slow but clear Lower extremities 3+ femoral and dorsalis pedis pulses palpable bilaterally with  1+ edema bilaterly   Today I ordered upper extremity vein mapping and arterial studies which I reviewed and interpreted. Patient does appear to have a satisfactory cephalic vein throughout the left upper extremity for fistula creation and triphasic flow in the radial and ulnar arteries to the left wrist       Assessment:      chronic kidney disease stage IV needs vascular access     Plan:      patient and his wife are not ready to schedule this quite yet and would like to discuss something with Dr. Darryl Lent schedule him for left radial cephalic versus brachial cephalic AV fistula performed under Mac  Anesthesia sent as we hear from family

## 2015-09-13 DIAGNOSIS — I87311 Chronic venous hypertension (idiopathic) with ulcer of right lower extremity: Secondary | ICD-10-CM | POA: Diagnosis not present

## 2015-09-19 ENCOUNTER — Other Ambulatory Visit: Payer: Self-pay | Admitting: Family Medicine

## 2015-09-20 ENCOUNTER — Encounter (HOSPITAL_BASED_OUTPATIENT_CLINIC_OR_DEPARTMENT_OTHER): Payer: BLUE CROSS/BLUE SHIELD | Attending: Surgery

## 2015-09-20 DIAGNOSIS — E11622 Type 2 diabetes mellitus with other skin ulcer: Secondary | ICD-10-CM | POA: Insufficient documentation

## 2015-09-20 DIAGNOSIS — L97811 Non-pressure chronic ulcer of other part of right lower leg limited to breakdown of skin: Secondary | ICD-10-CM | POA: Insufficient documentation

## 2015-09-20 DIAGNOSIS — I10 Essential (primary) hypertension: Secondary | ICD-10-CM | POA: Insufficient documentation

## 2015-09-20 DIAGNOSIS — L97211 Non-pressure chronic ulcer of right calf limited to breakdown of skin: Secondary | ICD-10-CM | POA: Diagnosis not present

## 2015-09-20 DIAGNOSIS — I890 Lymphedema, not elsewhere classified: Secondary | ICD-10-CM | POA: Insufficient documentation

## 2015-09-20 DIAGNOSIS — I87311 Chronic venous hypertension (idiopathic) with ulcer of right lower extremity: Secondary | ICD-10-CM | POA: Insufficient documentation

## 2015-09-22 NOTE — Progress Notes (Addendum)
Anesthesia Chart Review:  Pt is a 65 year old male scheduled for L radiocephalic vs brachiocephalic AV fistula creation on 09/27/2015 with Dr. Kellie Simmering.   Pt is a same day work up.   Nephrologist is [name]. Cardiologist is Dr. Einar Gip.   PMH includes:  Chronic combined CHF, HTN, hyperlipidemia, DM, OSA (on CPAP), CVA, prolactin secreting pituitary adenoma, CKD (stage IV). Former smoker. BMI 52  Pt hospitalized 12/12-12/18/16 for acute on chronic diastolic heart failure  Medications include: albiglutide, amlodipine, ASA, lipitor, bidil, plavix, iron, lasix, lantus, labetalol, metolazone, potassium, torsemide. Pt to stop plavix 5 days before surgery.   Labs will be obtained DOS.   1 view CXR 05/30/15: Cardiac enlargement with pulmonary vascular congestion. Mild but increasing edema since prior study.  EKG 06/02/15: Sinus rhythm with PACs  Echo 05/30/15:  - Left ventricle: The cavity size was normal. There was moderate concentric hypertrophy. Systolic function was normal. The estimated ejection fraction was in the range of 55% to 60%. Wall motion was normal; there were no regional wall motion abnormalities. The study is not technically sufficient to allow evaluation of LV diastolic function. - Mitral valve: There was mild regurgitation. - Left atrium: The atrium was mildly dilated. - Right ventricle: The cavity size was moderately dilated. Wall  thickness was normal. Systolic function was moderately reduced. - Right atrium: The atrium was moderately dilated. - Pulmonary arteries: PA peak pressure: 65 mm Hg (S). - Impressions: The right ventricular systolic pressure was increased consistent with severe pulmonary hypertension.  Carotid duplex 12/03/14: Findings consistent with 1-39 percent stenosis involving the B ICA  By notes from Dr. Irven Shelling office [facility], found in media tab), pt had a nuclear stress test 04/30/13 that showed moderate sized inferior and inferoapical  scar with mild to moderate peri-infarct ischemia.   Reviewed case with Dr. Al Corpus.   If no changes, I anticipate pt can proceed with surgery as scheduled.   Willeen Cass, FNP-BC [facility] Short Stay [facility]/Anesthesiology Phone: [phone] 09/22/2015 12:32 PM

## 2015-09-26 ENCOUNTER — Encounter (HOSPITAL_COMMUNITY): Payer: Self-pay | Admitting: *Deleted

## 2015-09-26 DIAGNOSIS — E11622 Type 2 diabetes mellitus with other skin ulcer: Secondary | ICD-10-CM | POA: Diagnosis not present

## 2015-09-26 MED ORDER — SODIUM CHLORIDE 0.9 % IV SOLN: INTRAVENOUS | Status: DC

## 2015-09-26 MED ORDER — DEXTROSE 5 % IV SOLN
1.5000 g | INTRAVENOUS | Status: AC
  Administered 2015-09-27: 1.5 g via INTRAVENOUS
  Filled 2015-09-26: qty 1.5

## 2015-09-26 NOTE — Progress Notes (Signed)
Pt SDW-Pre-op call completed by both pt and spouse, Leda Gauze with pt consent.Pt denies any acute cardiopulmonary issues. Pt under the care of Dr. Einar Gip, Cardiology.  Spouse denies pt having a cardiac cath. Spouse and pt unable to provide fasting blood glucose average. Spouse made aware of diabetes protocol to check BS every 2 hours prior to arrival, interventions for a BS < 70 and > 220 and phone # to SS. Spouse made aware to stop vitamins, fish oil, herbal medications and NSAID's. Spouse verbalized understanding of all pre-op instructions. Anesthesia asked to review pt history ( see note).

## 2015-09-27 ENCOUNTER — Ambulatory Visit (HOSPITAL_COMMUNITY)
Admission: RE | Admit: 2015-09-27 | Discharge: 2015-09-27 | Disposition: A | Discharge status: Home / Self Care | Payer: BLUE CROSS/BLUE SHIELD | Source: Ambulatory Visit | Attending: Vascular Surgery | Admitting: Vascular Surgery

## 2015-09-27 ENCOUNTER — Ambulatory Visit (HOSPITAL_COMMUNITY): Payer: BLUE CROSS/BLUE SHIELD | Admitting: Vascular Surgery

## 2015-09-27 ENCOUNTER — Encounter (HOSPITAL_COMMUNITY): Payer: Self-pay | Admitting: *Deleted

## 2015-09-27 ENCOUNTER — Other Ambulatory Visit: Payer: Self-pay | Admitting: *Deleted

## 2015-09-27 ENCOUNTER — Encounter (HOSPITAL_COMMUNITY)
Admission: RE | Disposition: A | Discharge status: Home / Self Care | Payer: Self-pay | Source: Ambulatory Visit | Attending: Vascular Surgery

## 2015-09-27 ENCOUNTER — Telehealth: Payer: Self-pay | Admitting: Vascular Surgery

## 2015-09-27 DIAGNOSIS — Z7902 Long term (current) use of antithrombotics/antiplatelets: Secondary | ICD-10-CM | POA: Insufficient documentation

## 2015-09-27 DIAGNOSIS — N184 Chronic kidney disease, stage 4 (severe): Secondary | ICD-10-CM | POA: Diagnosis not present

## 2015-09-27 DIAGNOSIS — Z87891 Personal history of nicotine dependence: Secondary | ICD-10-CM | POA: Insufficient documentation

## 2015-09-27 DIAGNOSIS — Z7982 Long term (current) use of aspirin: Secondary | ICD-10-CM | POA: Insufficient documentation

## 2015-09-27 DIAGNOSIS — G4733 Obstructive sleep apnea (adult) (pediatric): Secondary | ICD-10-CM | POA: Insufficient documentation

## 2015-09-27 DIAGNOSIS — I69320 Aphasia following cerebral infarction: Secondary | ICD-10-CM | POA: Insufficient documentation

## 2015-09-27 DIAGNOSIS — Z4931 Encounter for adequacy testing for hemodialysis: Secondary | ICD-10-CM

## 2015-09-27 DIAGNOSIS — I129 Hypertensive chronic kidney disease with stage 1 through stage 4 chronic kidney disease, or unspecified chronic kidney disease: Secondary | ICD-10-CM | POA: Diagnosis present

## 2015-09-27 DIAGNOSIS — Z6841 Body Mass Index (BMI) 40.0 and over, adult: Secondary | ICD-10-CM | POA: Insufficient documentation

## 2015-09-27 DIAGNOSIS — E7800 Pure hypercholesterolemia, unspecified: Secondary | ICD-10-CM | POA: Insufficient documentation

## 2015-09-27 DIAGNOSIS — Z8673 Personal history of transient ischemic attack (TIA), and cerebral infarction without residual deficits: Secondary | ICD-10-CM | POA: Diagnosis not present

## 2015-09-27 DIAGNOSIS — Z794 Long term (current) use of insulin: Secondary | ICD-10-CM | POA: Insufficient documentation

## 2015-09-27 DIAGNOSIS — I5042 Chronic combined systolic (congestive) and diastolic (congestive) heart failure: Secondary | ICD-10-CM | POA: Insufficient documentation

## 2015-09-27 DIAGNOSIS — N186 End stage renal disease: Secondary | ICD-10-CM

## 2015-09-27 DIAGNOSIS — E785 Hyperlipidemia, unspecified: Secondary | ICD-10-CM | POA: Insufficient documentation

## 2015-09-27 DIAGNOSIS — I130 Hypertensive heart and chronic kidney disease with heart failure and stage 1 through stage 4 chronic kidney disease, or unspecified chronic kidney disease: Secondary | ICD-10-CM | POA: Insufficient documentation

## 2015-09-27 DIAGNOSIS — E1122 Type 2 diabetes mellitus with diabetic chronic kidney disease: Secondary | ICD-10-CM | POA: Insufficient documentation

## 2015-09-27 HISTORY — PX: AV FISTULA PLACEMENT: SHX1204

## 2015-09-27 LAB — GLUCOSE, CAPILLARY
GLUCOSE-CAPILLARY: 115 mg/dL — AB (ref 65–99)
GLUCOSE-CAPILLARY: 128 mg/dL — AB (ref 65–99)

## 2015-09-27 LAB — POCT I-STAT 4, (NA,K, GLUC, HGB,HCT)
GLUCOSE: 132 mg/dL — AB (ref 65–99)
HEMATOCRIT: 30 % — AB (ref 39.0–52.0)
HEMOGLOBIN: 10.2 g/dL — AB (ref 13.0–17.0)
POTASSIUM: 3.9 mmol/L (ref 3.5–5.1)
SODIUM: 144 mmol/L (ref 135–145)

## 2015-09-27 LAB — SURGICAL PCR SCREEN
MRSA, PCR: POSITIVE — AB
STAPHYLOCOCCUS AUREUS: POSITIVE — AB

## 2015-09-27 SURGERY — ARTERIOVENOUS (AV) FISTULA CREATION
Anesthesia: Monitor Anesthesia Care | Site: Arm Lower | Laterality: Left

## 2015-09-27 MED ORDER — FENTANYL CITRATE (PF) 100 MCG/2ML IJ SOLN
25.0000 ug | INTRAMUSCULAR | Status: DC | PRN
  Administered 2015-09-27: 25 ug via INTRAVENOUS

## 2015-09-27 MED ORDER — DEXMEDETOMIDINE HCL IN NACL 200 MCG/50ML IV SOLN
INTRAVENOUS | Status: DC | PRN
  Administered 2015-09-27: .5 ug/kg/h via INTRAVENOUS

## 2015-09-27 MED ORDER — OXYCODONE HCL 5 MG PO TABS
ORAL_TABLET | ORAL | Status: AC
  Administered 2015-09-27: 5 mg via ORAL
  Filled 2015-09-27: qty 2

## 2015-09-27 MED ORDER — FENTANYL CITRATE (PF) 100 MCG/2ML IJ SOLN
INTRAMUSCULAR | Status: DC | PRN
Start: 2015-09-27 — End: 2015-09-27
  Administered 2015-09-27: 50 ug via INTRAVENOUS

## 2015-09-27 MED ORDER — LIDOCAINE-EPINEPHRINE (PF) 1 %-1:200000 IJ SOLN
INTRAMUSCULAR | Status: DC | PRN
Start: 2015-09-27 — End: 2015-09-27
  Administered 2015-09-27: 19 mL

## 2015-09-27 MED ORDER — MUPIROCIN 2 % EX OINT
1.0000 "application " | TOPICAL_OINTMENT | Freq: Once | CUTANEOUS | Status: DC
  Filled 2015-09-27: qty 22

## 2015-09-27 MED ORDER — PROPOFOL 10 MG/ML IV BOLUS
INTRAVENOUS | Status: AC
  Filled 2015-09-27: qty 20

## 2015-09-27 MED ORDER — FENTANYL CITRATE (PF) 250 MCG/5ML IJ SOLN
INTRAMUSCULAR | Status: AC
  Filled 2015-09-27: qty 5

## 2015-09-27 MED ORDER — SODIUM CHLORIDE 0.9 % IV SOLN
INTRAVENOUS | Status: DC | PRN
  Administered 2015-09-27: 08:00:00 via INTRAVENOUS

## 2015-09-27 MED ORDER — MIDAZOLAM HCL 5 MG/5ML IJ SOLN
INTRAMUSCULAR | Status: DC | PRN
  Administered 2015-09-27: 2 mg via INTRAVENOUS

## 2015-09-27 MED ORDER — SODIUM CHLORIDE 0.9 % IV SOLN
INTRAVENOUS | Status: DC | PRN
  Administered 2015-09-27: 08:00:00

## 2015-09-27 MED ORDER — LIDOCAINE-EPINEPHRINE (PF) 1 %-1:200000 IJ SOLN
INTRAMUSCULAR | Status: AC
  Filled 2015-09-27: qty 30

## 2015-09-27 MED ORDER — MIDAZOLAM HCL 2 MG/2ML IJ SOLN
INTRAMUSCULAR | Status: AC
  Filled 2015-09-27: qty 2

## 2015-09-27 MED ORDER — OXYCODONE HCL 5 MG PO TABS
5.0000 mg | ORAL_TABLET | Freq: Once | ORAL | Status: AC | PRN
  Administered 2015-09-27: 5 mg via ORAL

## 2015-09-27 MED ORDER — CHLORHEXIDINE GLUCONATE CLOTH 2 % EX PADS: 6.0000 | MEDICATED_PAD | Freq: Once | CUTANEOUS | Status: DC

## 2015-09-27 MED ORDER — OXYCODONE HCL 5 MG/5ML PO SOLN: 5.0000 mg | Freq: Once | ORAL | Status: AC | PRN

## 2015-09-27 MED ORDER — OXYCODONE HCL 5 MG PO TABS
5.0000 mg | ORAL_TABLET | Freq: Once | ORAL | Status: AC
  Administered 2015-09-27: 5 mg via ORAL

## 2015-09-27 MED ORDER — OXYCODONE HCL 5 MG PO TABS: 5.0000 mg | ORAL_TABLET | Freq: Four times a day (QID) | ORAL | Status: DC | PRN

## 2015-09-27 MED ORDER — DEXMEDETOMIDINE HCL 200 MCG/2ML IV SOLN: INTRAVENOUS | Status: DC | PRN

## 2015-09-27 MED ORDER — 0.9 % SODIUM CHLORIDE (POUR BTL) OPTIME
TOPICAL | Status: DC | PRN
  Administered 2015-09-27: 1000 mL

## 2015-09-27 MED ORDER — POTASSIUM CHLORIDE CRYS ER 20 MEQ PO TBCR: EXTENDED_RELEASE_TABLET | ORAL | Status: DC

## 2015-09-27 MED ORDER — ONDANSETRON HCL 4 MG/2ML IJ SOLN: 4.0000 mg | Freq: Once | INTRAMUSCULAR | Status: DC | PRN

## 2015-09-27 MED ORDER — FENTANYL CITRATE (PF) 100 MCG/2ML IJ SOLN
INTRAMUSCULAR | Status: AC
  Filled 2015-09-27: qty 2

## 2015-09-27 SURGICAL SUPPLY — 31 items
ARMBAND PINK RESTRICT EXTREMIT (MISCELLANEOUS) ×3 IMPLANT
CANISTER SUCTION 2500CC (MISCELLANEOUS) ×3 IMPLANT
CLIP TI MEDIUM 6 (CLIP) ×3 IMPLANT
CLIP TI WIDE RED SMALL 6 (CLIP) ×6 IMPLANT
COVER PROBE W GEL 5X96 (DRAPES) IMPLANT
DRAIN PENROSE 1/4X12 LTX STRL (WOUND CARE) ×3 IMPLANT
ELECT REM PT RETURN 9FT ADLT (ELECTROSURGICAL) ×3
ELECTRODE REM PT RTRN 9FT ADLT (ELECTROSURGICAL) ×1 IMPLANT
GEL ULTRASOUND 20GR AQUASONIC (MISCELLANEOUS) IMPLANT
GLOVE BIO SURGEON STRL SZ 6 (GLOVE) ×3 IMPLANT
GLOVE BIO SURGEON STRL SZ 6.5 (GLOVE) ×4 IMPLANT
GLOVE BIO SURGEONS STRL SZ 6.5 (GLOVE) ×2
GLOVE BIOGEL PI IND STRL 6.5 (GLOVE) ×2 IMPLANT
GLOVE BIOGEL PI INDICATOR 6.5 (GLOVE) ×4
GLOVE SS BIOGEL STRL SZ 7 (GLOVE) ×1 IMPLANT
GLOVE SUPERSENSE BIOGEL SZ 7 (GLOVE) ×2
GLOVE SURG SS PI 7.0 STRL IVOR (GLOVE) ×3 IMPLANT
GOWN STRL REUS W/ TWL LRG LVL3 (GOWN DISPOSABLE) ×3 IMPLANT
GOWN STRL REUS W/TWL LRG LVL3 (GOWN DISPOSABLE) ×6
GOWN STRL REUS W/TWL XL LVL3 (GOWN DISPOSABLE) ×3 IMPLANT
KIT BASIN OR (CUSTOM PROCEDURE TRAY) ×3 IMPLANT
KIT ROOM TURNOVER OR (KITS) ×3 IMPLANT
LIQUID BAND (GAUZE/BANDAGES/DRESSINGS) ×3 IMPLANT
NS IRRIG 1000ML POUR BTL (IV SOLUTION) ×3 IMPLANT
PACK CV ACCESS (CUSTOM PROCEDURE TRAY) ×3 IMPLANT
PAD ARMBOARD 7.5X6 YLW CONV (MISCELLANEOUS) ×6 IMPLANT
SUT PROLENE 6 0 BV (SUTURE) ×3 IMPLANT
SUT VIC AB 3-0 SH 27 (SUTURE) ×2
SUT VIC AB 3-0 SH 27X BRD (SUTURE) ×1 IMPLANT
UNDERPAD 30X30 INCONTINENT (UNDERPADS AND DIAPERS) ×3 IMPLANT
WATER STERILE IRR 1000ML POUR (IV SOLUTION) ×3 IMPLANT

## 2015-09-27 NOTE — Anesthesia Procedure Notes (Signed)
Procedure Name: MAC Date/Time: 09/27/2015 8:30 AM Performed by: Neldon Newport Pre-anesthesia Checklist: Patient being monitored, Suction available, Emergency Drugs available and Patient identified Oxygen Delivery Method: Simple face mask Placement Confirmation: positive ETCO2 Dental Injury: Teeth and Oropharynx as per pre-operative assessment

## 2015-09-27 NOTE — Anesthesia Preprocedure Evaluation (Addendum)
Anesthesia Evaluation  Patient identified by MRN, date of birth, ID band Patient awake    Reviewed: Allergy & Precautions, H&P , NPO status , Patient's Chart, lab work & pertinent test results  Airway Mallampati: III  TM Distance: >3 FB Neck ROM: full    Dental  (+) Dental Advidsory Given   Pulmonary sleep apnea , former smoker,    breath sounds clear to auscultation       Cardiovascular hypertension, +CHF   Rhythm:regular     Neuro/Psych Seizures -,  CVA    GI/Hepatic   Endo/Other  diabetes  Renal/GU ESRFRenal disease     Musculoskeletal   Abdominal   Peds  Hematology   Anesthesia Other Findings - Left ventricle: The cavity size was normal. There was moderate  concentric hypertrophy. Systolic function was normal. The  estimated ejection fraction was in the range of 55% to 60%. Wall  motion was normal; there were no regional wall motion  abnormalities. The study is not technically sufficient to allow  evaluation of LV diastolic function. - Mitral valve: There was mild regurgitation. - Left atrium: The atrium was mildly dilated. - Right ventricle: The cavity size was moderately dilated. Wall  thickness was normal. Systolic function was moderately reduced. - Right atrium: The atrium was moderately dilated. - Atrial septum: No defect or patent foramen ovale was identified. - Pulmonary arteries: PA peak pressure: 65 mm Hg (S). - Impressions: The right ventricular systolic pressure was  increased consistent with severe pulmonary hypertension.  Reproductive/Obstetrics                           Anesthesia Physical Anesthesia Plan  ASA: III  Anesthesia Plan: MAC   Post-op Pain Management:    Induction: Intravenous  Airway Management Planned: Natural Airway and Simple Face Mask  Additional Equipment:   Intra-op Plan:   Post-operative Plan:   Informed Consent: I have reviewed  the patients History and Physical, chart, labs and discussed the procedure including the risks, benefits and alternatives for the proposed anesthesia with the patient or authorized representative who has indicated his/her understanding and acceptance.   Dental Advisory Given and Dental advisory given  Plan Discussed with: CRNA and Anesthesiologist  Anesthesia Plan Comments: (PLAN MAC  )       Anesthesia Quick Evaluation

## 2015-09-27 NOTE — Anesthesia Postprocedure Evaluation (Signed)
Anesthesia Post Note  Patient: Blake Ina Sr.  Procedure(s) Performed: Procedure(s) (LRB): RADIOCEPHALIC ARTERIOVENOUS (AV) FISTULA CREATION LEFT ARM (Left)  Patient location during evaluation: PACU Anesthesia Type: General Level of consciousness: awake, awake and alert and oriented Pain management: pain level controlled Vital Signs Assessment: post-procedure vital signs reviewed and stable Respiratory status: nonlabored ventilation and spontaneous breathing Cardiovascular status: blood pressure returned to baseline Anesthetic complications: no    Last Vitals:  Filed Vitals:   09/27/15 1100 09/27/15 1113  BP:  154/91  Pulse: 61 67  Temp: 36.7 C 36.7 C  Resp: 21 16    Last Pain:  Filed Vitals:   09/27/15 1113  PainSc: 0-No pain                 [name]

## 2015-09-27 NOTE — Telephone Encounter (Signed)
sched lab for 5/31 at 2 and JDL for 6/5 at 12:30. Lm on hm# to inform pt of appts

## 2015-09-27 NOTE — Transfer of Care (Signed)
Immediate Anesthesia Transfer of Care Note  Patient: Blake Ina Sr.  Procedure(s) Performed: Procedure(s): RADIOCEPHALIC ARTERIOVENOUS (AV) FISTULA CREATION LEFT ARM (Left)  Patient Location: PACU  Anesthesia Type:MAC  Level of Consciousness: awake, alert  and oriented  Airway & Oxygen Therapy: Patient Spontanous Breathing  Post-op Assessment: Report given to RN, Post -op Vital signs reviewed and stable and Patient moving all extremities X 4  Post vital signs: Reviewed and stable  Last Vitals:  Filed Vitals:   09/27/15 0649  BP: 146/71  Pulse: 71  Temp: 36.6 C  Resp: 18    Complications: No apparent anesthesia complications

## 2015-09-27 NOTE — Op Note (Signed)
OPERATIVE REPORT  Date of Surgery: 09/27/2015  Surgeon: [name], MD  Assistant: Leontine Locket PA  Pre-op Diagnosis: Stage IV Chronic Kidney Disease N18.4  Post-op Diagnosis: Stage IV Chronic Kidney Disease N18.4  Procedure: Procedure(s): RADIOCEPHALIC ARTERIOVENOUS (AV) FISTULA CREATION LEFT ARM  Anesthesia: Mac  EBL: Minimal  Complications: None  Procedure Details: The patient was taken the operating room placed in supine position at which time left upper extremity was prepped Betadine scrub and solution draped in routine sterile manner. The the cephalic vein was then imaged using B-mode ultrasound-sono site and seen to be adequate from the wrist to the proximal shoulder consistent with preoperative vein mapping. After infiltration forms and Xylocaine with epinephrine short longitudinal incision was made proximal the wrist between the radial artery and cephalic vein. Cephalic vein was dissected free branches ligated with 3 and 4-0 silk ties and divided. Ligated distally transected gently dilated with heparinized saline and marked for orientation purposes. It was approximately 3 mm in size. Radial artery was exposed beneath the fascia was 3-1/2 mm vessel with an excellent pulse. It was encircled with Vesseloops proximally and distally. It was then occluded proximally and distally over the 15 blade extended with the Potts scissors. The vein was carefully measured spatulated and anastomosed end to side with 6-0 Prolene. Following appropriate flushing this was completed Vesseloops released and there was an excellent pulse and palpable thrill in the fistula up to the antecubital area and excellent Doppler flow. No heparin or protamine was given. Adequate hemostasis achieved wound closed in layers with Vicryl in subcuticular fashion with Dermabond patient taken to the recovery room in satisfactory condition   [name], MD 09/27/2015 9:50 AM

## 2015-09-27 NOTE — Discharge Instructions (Signed)
° ° °  09/27/2015 Blake Tippie Sr. [id number] [birth year]  Surgeon(s): Mal Misty, MD  Procedure(s): Left Radial Cephalic AV fistula creation  x Do not stick fistula for 12 weeks

## 2015-09-27 NOTE — Interval H&P Note (Signed)
History and Physical Interval Note:  09/27/2015 8:22 AM  Blake Ina Sr.  has presented today for surgery, with the diagnosis of Stage IV Chronic Kidney Disease N18.4  The various methods of treatment have been discussed with the patient and family. After consideration of risks, benefits and other options for treatment, the patient has consented to  Procedure(s): RADIOCEPHALIC VERSUS BRACHIOCEPHALIC ARTERIOVENOUS (AV) FISTULA CREATION (Left) as a surgical intervention .  The patient's history has been reviewed, patient examined, no change in status, stable for surgery.  I have reviewed the patient's chart and labs.  Questions were answered to the patient's satisfaction.     [name]

## 2015-09-27 NOTE — H&P (View-Only) (Signed)
Subjective:     Patient ID: Blake Ina Sr., male   DOB: December 04, 1950, 65 y.o.   MRN: [id number]  HPI This 65 year old male was referred by [name] for evaluation of vascular access. Patient has stage IV chronic kidney disease with recent creatinine of 3.74. He has had chronic renal insufficiency for many years. He states that he has 3 brothers who possibly may donate a kidney for transplant.  He is right-handed. He has never been on hemodialysis.  Past Medical History  Diagnosis Date  . Hypertension   . Multiple wounds     on lower legs - sees [name] Pain at the [facility]  . Hypercholesterolemia   . OSA on CPAP     uses cpap, setting of 5  . 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 Type II diabetes mellitus ([place])     uncontrolled/notes 12/02/2014  . Chronic kidney disease (CKD), stage III (moderate)     sees dr Florene Glen nephrology every 6-9 months  . CVA (cerebral vascular accident) [facility]) 12/2013    admitted in July 2015 for acute right-sided infarct/notes 12/02/2014  . 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 Systolic CHF [facility])     Social History  Substance Use Topics  . Smoking status: Former Smoker -- 0.25 packs/day for 2 years    Types: Cigarettes    Quit date: 06/17/1970  . Smokeless tobacco: Never Used  . Alcohol Use: No    Family History  Problem Relation Age of Onset  . Diabetes Mother   . Hypertension Mother   . Diabetes Father   . Hypertension Father   . Diabetes Sister   . Diabetes Brother   . Asthma Son     No Known Allergies   Current outpatient prescriptions:  .  Albiglutide (TANZEUM) 30 MG PEN, Inject 30 mg into the skin once a week., Disp: 1 each, Rfl: 2 .  amLODipine (NORVASC) 10 MG tablet, Take 1 tablet (10 mg total) by mouth daily., Disp: 30 tablet, Rfl: 0 .  aspirin 325 MG tablet, Take 325 mg by mouth daily., Disp: , Rfl:  .  atorvastatin (LIPITOR) 80 MG tablet, TAKE 1 TABLET AT 6PM., Disp: 90 tablet, Rfl:  3 .  B-D ULTRAFINE III SHORT PEN 31G X 8 MM MISC, USE WITH SOLOSTAR AND NOVOLOG, Disp: 100 each, Rfl: 1 .  BIDIL 20-37.5 MG tablet, TAKE (2) TABLETS THREE TIMES DAILY., Disp: 540 tablet, Rfl: 1 .  clopidogrel (PLAVIX) 75 MG tablet, Take 1 tablet (75 mg total) by mouth daily with breakfast., Disp: 30 tablet, Rfl: 3 .  doxycycline (VIBRA-TABS) 100 MG tablet, Take 1 tablet (100 mg total) by mouth 2 (two) times daily., Disp: 20 tablet, Rfl: 0 .  escitalopram (LEXAPRO) 10 MG tablet, TK 1 T PO QD, Disp: , Rfl: 1 .  ferrous sulfate 325 (65 FE) MG tablet, Take 1 tablet (325 mg total) by mouth 2 (two) times daily with a meal., Disp: 60 tablet, Rfl: 0 .  furosemide (LASIX) 80 MG tablet, Take 1 tablet (80 mg total) by mouth 2 (two) times daily., Disp: 60 tablet, Rfl: 0 .  Insulin Glargine (LANTUS SOLOSTAR) 100 UNIT/ML Solostar Pen, Inject 20 Units into the skin daily., Disp: 5 pen, Rfl: 6 .  labetalol (NORMODYNE) 300 MG tablet, Take 300 mg by mouth 3 (three) times daily., Disp: , Rfl: 11 .  metolazone (ZAROXOLYN) 5 MG tablet, Take 1 tablet (5 mg total) by  mouth daily., Disp: 30 tablet, Rfl: 0 .  mupirocin ointment (BACTROBAN) 2 %, Place 1 application into the nose 2 (two) times daily., Disp: 22 g, Rfl: 0 .  potassium chloride SA (K-DUR,KLOR-CON) 20 MEQ tablet, Take 2 tablets (40 mEq total) by mouth daily., Disp: 60 tablet, Rfl: 1 .  Syringe, Disposable, 1 ML MISC, Insulin syringes as needed to go with medications - lantus and tanzeum, Disp: 200 each, Rfl: 11 .  cabergoline (DOSTINEX) 0.5 MG tablet, Take 0.5 tablets (0.25 mg total) by mouth 2 (two) times a week. (Patient not taking: Reported on 09/12/2015), Disp: 12 tablet, Rfl: 0 .  torsemide (DEMADEX) 20 MG tablet, Take 20 mg by mouth daily. Reported on 09/12/2015, Disp: , Rfl:   Filed Vitals:   09/12/15 1220  BP: 171/94  Pulse: 69  Resp: 18  Height: 5' 9.5" (1.765 m)  Weight: 356 lb (161.481 kg)  SpO2: 93%    Body mass index is 51.84  kg/(m^2).           Review of Systems Patient has morbid obesity. Patient has history of hyper tension, hypercholesterolemia, diabetes mellitus, and previous stroke causing a aphasia. Other systems negative and complete review of systems -see history of present illness     Objective:   Physical Exam BP 171/94 mmHg  Pulse 69  Resp 18  Ht 5' 9.5" (1.765 m)  Wt 356 lb (161.481 kg)  BMI 51.84 kg/m2  SpO2 93%  Gen.-alert and oriented x3 in no apparent distress -morbidly obese HEENT normal for age Lungs no rhonchi or wheezing Cardiovascular regular rhythm no murmurs carotid pulses 3+ palpable no bruits audible Abdomen soft nontender no palpable masses Musculoskeletal free of  major deformities Skin clear -no rashes Neurologic normal except speech is slow but clear Lower extremities 3+ femoral and dorsalis pedis pulses palpable bilaterally with  1+ edema bilaterly   Today I ordered upper extremity vein mapping and arterial studies which I reviewed and interpreted. Patient does appear to have a satisfactory cephalic vein throughout the left upper extremity for fistula creation and triphasic flow in the radial and ulnar arteries to the left wrist       Assessment:      chronic kidney disease stage IV needs vascular access     Plan:      patient and his wife are not ready to schedule this quite yet and would like to discuss something with Dr. Darryl Lent schedule him for left radial cephalic versus brachial cephalic AV fistula performed under [facility]  Anesthesia sent as we hear from family

## 2015-09-27 NOTE — Telephone Encounter (Signed)
-----   Message from Mena Goes, RN sent at 09/27/2015  9:51 AM EDT ----- Regarding: schedule   ----- Message -----    From: Gabriel Earing, PA-C    Sent: 09/27/2015   9:33 AM      To: [facility] Charge Pool  S/p left RC AVF 09/27/15. F/u with JDL in 6 weeks with duplex.  Thanks, Aldona Bar

## 2015-09-28 ENCOUNTER — Encounter (HOSPITAL_COMMUNITY): Payer: Self-pay | Admitting: Vascular Surgery

## 2015-10-03 DIAGNOSIS — E11622 Type 2 diabetes mellitus with other skin ulcer: Secondary | ICD-10-CM | POA: Diagnosis not present

## 2015-10-10 DIAGNOSIS — E11622 Type 2 diabetes mellitus with other skin ulcer: Secondary | ICD-10-CM | POA: Diagnosis not present

## 2015-10-17 ENCOUNTER — Encounter (HOSPITAL_BASED_OUTPATIENT_CLINIC_OR_DEPARTMENT_OTHER): Payer: BLUE CROSS/BLUE SHIELD | Attending: Surgery

## 2015-10-17 DIAGNOSIS — L97811 Non-pressure chronic ulcer of other part of right lower leg limited to breakdown of skin: Secondary | ICD-10-CM | POA: Insufficient documentation

## 2015-10-17 DIAGNOSIS — I890 Lymphedema, not elsewhere classified: Secondary | ICD-10-CM | POA: Diagnosis not present

## 2015-10-17 DIAGNOSIS — E11622 Type 2 diabetes mellitus with other skin ulcer: Secondary | ICD-10-CM | POA: Insufficient documentation

## 2015-10-17 DIAGNOSIS — I87311 Chronic venous hypertension (idiopathic) with ulcer of right lower extremity: Secondary | ICD-10-CM | POA: Insufficient documentation

## 2015-10-17 DIAGNOSIS — I10 Essential (primary) hypertension: Secondary | ICD-10-CM | POA: Insufficient documentation

## 2015-10-24 DIAGNOSIS — E11622 Type 2 diabetes mellitus with other skin ulcer: Secondary | ICD-10-CM | POA: Diagnosis not present

## 2015-10-31 DIAGNOSIS — E11622 Type 2 diabetes mellitus with other skin ulcer: Secondary | ICD-10-CM | POA: Diagnosis not present

## 2015-11-07 DIAGNOSIS — E11622 Type 2 diabetes mellitus with other skin ulcer: Secondary | ICD-10-CM | POA: Diagnosis not present

## 2015-11-14 ENCOUNTER — Encounter: Payer: Self-pay | Admitting: Vascular Surgery

## 2015-11-14 DIAGNOSIS — E11622 Type 2 diabetes mellitus with other skin ulcer: Secondary | ICD-10-CM | POA: Diagnosis not present

## 2015-11-14 LAB — GLUCOSE, CAPILLARY: GLUCOSE-CAPILLARY: 99 mg/dL (ref 65–99)

## 2015-11-15 ENCOUNTER — Ambulatory Visit (HOSPITAL_COMMUNITY)
Admission: RE | Admit: 2015-11-15 | Discharge: 2015-11-15 | Disposition: A | Discharge status: Home / Self Care | Payer: BLUE CROSS/BLUE SHIELD | Source: Ambulatory Visit | Attending: Vascular Surgery | Admitting: Vascular Surgery

## 2015-11-15 DIAGNOSIS — E1122 Type 2 diabetes mellitus with diabetic chronic kidney disease: Secondary | ICD-10-CM | POA: Diagnosis not present

## 2015-11-15 DIAGNOSIS — I5020 Unspecified systolic (congestive) heart failure: Secondary | ICD-10-CM | POA: Diagnosis not present

## 2015-11-15 DIAGNOSIS — E7800 Pure hypercholesterolemia, unspecified: Secondary | ICD-10-CM | POA: Insufficient documentation

## 2015-11-15 DIAGNOSIS — G4733 Obstructive sleep apnea (adult) (pediatric): Secondary | ICD-10-CM | POA: Diagnosis not present

## 2015-11-15 DIAGNOSIS — Z4931 Encounter for adequacy testing for hemodialysis: Secondary | ICD-10-CM | POA: Diagnosis not present

## 2015-11-15 DIAGNOSIS — N186 End stage renal disease: Secondary | ICD-10-CM | POA: Insufficient documentation

## 2015-11-15 DIAGNOSIS — I132 Hypertensive heart and chronic kidney disease with heart failure and with stage 5 chronic kidney disease, or end stage renal disease: Secondary | ICD-10-CM | POA: Insufficient documentation

## 2015-11-20 ENCOUNTER — Ambulatory Visit (INDEPENDENT_AMBULATORY_CARE_PROVIDER_SITE_OTHER): Payer: Self-pay | Admitting: Vascular Surgery

## 2015-11-20 ENCOUNTER — Encounter: Payer: Self-pay | Admitting: Vascular Surgery

## 2015-11-20 VITALS — BP 146/82 | HR 68 | Temp 97.9°F | Resp 18 | Ht 69.5 in | Wt 348.8 lb

## 2015-11-20 DIAGNOSIS — N184 Chronic kidney disease, stage 4 (severe): Secondary | ICD-10-CM

## 2015-11-20 NOTE — Progress Notes (Signed)
Subjective:     Patient ID: Blake Ina Sr., male   DOB: 1951-01-27, 65 y.o.   MRN: [id number]  HPI this 65 year old male returns for initial follow-up regarding the left radial-cephalic AV fistula I created 09/27/2015. Patient has stage IV chronic kidney disease and has never been on hemodialysis. He denies any pain or numbness in the left hand other than some mild tingling at the base of the left thumb   Review of Systems     Objective:   Physical Exam BP 146/82 mmHg  Pulse 68  Temp(Src) 97.9 F (36.6 C) (Oral)  Resp 18  Ht 5' 9.5" (1.765 m)  Wt 348 lb 12.8 oz (158.215 kg)  BMI 50.79 kg/m2  SpO2 95%  Gen. obese male no apparent distress alert and oriented 3 Left upper extremity with well-healed incision proximal to the wrist. Good pulse and palpable thrill up to antecubital area with easily palpable pulse. Left hand well-perfused.  Patient had duplex evaluation of the fistula on 11/15/2015 which reveals good caliber vein varying between 0.35 and 0.47 cm with no significant side branches     Assessment:     Nicely functioning left radial-cephalic AV fistula in patient with stage IV chronic kidney disease.patient has not been on hemodialysis    Plan:     Would be okay to use fistula as of 12/27/2015 if needed Return to see me on when necessary basis

## 2015-11-20 NOTE — Progress Notes (Signed)
Filed Vitals:   11/20/15 1224 11/20/15 1227  BP: 152/79 146/82  Pulse: 68   Temp: 97.9 F (36.6 C)   TempSrc: Oral   Resp: 18   Height: 5' 9.5" (1.765 m)   Weight: 348 lb 12.8 oz (158.215 kg)   SpO2: 95%

## 2015-11-21 ENCOUNTER — Encounter (HOSPITAL_BASED_OUTPATIENT_CLINIC_OR_DEPARTMENT_OTHER): Payer: BLUE CROSS/BLUE SHIELD | Attending: Surgery

## 2015-11-21 DIAGNOSIS — I10 Essential (primary) hypertension: Secondary | ICD-10-CM | POA: Insufficient documentation

## 2015-11-21 DIAGNOSIS — L97811 Non-pressure chronic ulcer of other part of right lower leg limited to breakdown of skin: Secondary | ICD-10-CM | POA: Insufficient documentation

## 2015-11-21 DIAGNOSIS — L97821 Non-pressure chronic ulcer of other part of left lower leg limited to breakdown of skin: Secondary | ICD-10-CM | POA: Diagnosis not present

## 2015-11-21 DIAGNOSIS — I87311 Chronic venous hypertension (idiopathic) with ulcer of right lower extremity: Secondary | ICD-10-CM | POA: Diagnosis not present

## 2015-11-21 DIAGNOSIS — Z79899 Other long term (current) drug therapy: Secondary | ICD-10-CM | POA: Insufficient documentation

## 2015-11-21 DIAGNOSIS — E11622 Type 2 diabetes mellitus with other skin ulcer: Secondary | ICD-10-CM | POA: Diagnosis not present

## 2015-11-21 DIAGNOSIS — Z7982 Long term (current) use of aspirin: Secondary | ICD-10-CM | POA: Insufficient documentation

## 2015-11-21 DIAGNOSIS — I890 Lymphedema, not elsewhere classified: Secondary | ICD-10-CM | POA: Diagnosis not present

## 2015-11-28 DIAGNOSIS — E11622 Type 2 diabetes mellitus with other skin ulcer: Secondary | ICD-10-CM | POA: Diagnosis not present

## 2015-12-04 ENCOUNTER — Other Ambulatory Visit: Payer: Self-pay | Admitting: Family Medicine

## 2015-12-05 DIAGNOSIS — E11622 Type 2 diabetes mellitus with other skin ulcer: Secondary | ICD-10-CM | POA: Diagnosis not present

## 2015-12-12 DIAGNOSIS — E11622 Type 2 diabetes mellitus with other skin ulcer: Secondary | ICD-10-CM | POA: Diagnosis not present

## 2015-12-20 ENCOUNTER — Encounter (HOSPITAL_BASED_OUTPATIENT_CLINIC_OR_DEPARTMENT_OTHER): Payer: BLUE CROSS/BLUE SHIELD | Attending: Surgery

## 2015-12-20 DIAGNOSIS — E11622 Type 2 diabetes mellitus with other skin ulcer: Secondary | ICD-10-CM | POA: Insufficient documentation

## 2015-12-20 DIAGNOSIS — L97811 Non-pressure chronic ulcer of other part of right lower leg limited to breakdown of skin: Secondary | ICD-10-CM | POA: Insufficient documentation

## 2015-12-20 DIAGNOSIS — I10 Essential (primary) hypertension: Secondary | ICD-10-CM | POA: Diagnosis not present

## 2015-12-20 DIAGNOSIS — L97211 Non-pressure chronic ulcer of right calf limited to breakdown of skin: Secondary | ICD-10-CM | POA: Insufficient documentation

## 2015-12-20 DIAGNOSIS — I890 Lymphedema, not elsewhere classified: Secondary | ICD-10-CM | POA: Insufficient documentation

## 2015-12-20 DIAGNOSIS — I87311 Chronic venous hypertension (idiopathic) with ulcer of right lower extremity: Secondary | ICD-10-CM | POA: Diagnosis not present

## 2015-12-26 DIAGNOSIS — E11622 Type 2 diabetes mellitus with other skin ulcer: Secondary | ICD-10-CM | POA: Diagnosis not present

## 2016-01-02 DIAGNOSIS — E11622 Type 2 diabetes mellitus with other skin ulcer: Secondary | ICD-10-CM | POA: Diagnosis not present

## 2016-01-09 ENCOUNTER — Other Ambulatory Visit: Payer: Self-pay | Admitting: Physician Assistant

## 2016-01-09 DIAGNOSIS — E11622 Type 2 diabetes mellitus with other skin ulcer: Secondary | ICD-10-CM | POA: Diagnosis not present

## 2016-01-16 ENCOUNTER — Encounter (HOSPITAL_BASED_OUTPATIENT_CLINIC_OR_DEPARTMENT_OTHER): Payer: Medicare Other | Attending: Surgery

## 2016-01-16 DIAGNOSIS — I87311 Chronic venous hypertension (idiopathic) with ulcer of right lower extremity: Secondary | ICD-10-CM | POA: Diagnosis not present

## 2016-01-16 DIAGNOSIS — I890 Lymphedema, not elsewhere classified: Secondary | ICD-10-CM | POA: Insufficient documentation

## 2016-01-16 DIAGNOSIS — L97212 Non-pressure chronic ulcer of right calf with fat layer exposed: Secondary | ICD-10-CM | POA: Diagnosis not present

## 2016-01-16 DIAGNOSIS — E11622 Type 2 diabetes mellitus with other skin ulcer: Secondary | ICD-10-CM | POA: Insufficient documentation

## 2016-01-16 DIAGNOSIS — I10 Essential (primary) hypertension: Secondary | ICD-10-CM | POA: Diagnosis not present

## 2016-01-23 DIAGNOSIS — E11622 Type 2 diabetes mellitus with other skin ulcer: Secondary | ICD-10-CM | POA: Diagnosis not present

## 2016-01-30 DIAGNOSIS — E11622 Type 2 diabetes mellitus with other skin ulcer: Secondary | ICD-10-CM | POA: Diagnosis not present

## 2016-02-06 DIAGNOSIS — E11622 Type 2 diabetes mellitus with other skin ulcer: Secondary | ICD-10-CM | POA: Diagnosis not present

## 2016-02-13 DIAGNOSIS — E11622 Type 2 diabetes mellitus with other skin ulcer: Secondary | ICD-10-CM | POA: Diagnosis not present

## 2016-02-15 ENCOUNTER — Other Ambulatory Visit: Payer: Self-pay | Admitting: Physician Assistant

## 2016-02-16 NOTE — Telephone Encounter (Signed)
Spoke to pt and advised he needs f/up. He agreed and helped him set up appt w/Dr Brigitte Pulse for tomorrow. Pt reported he has enough med until then. I had Gina tell pt to come in fasting so that a lipid panel can be run if needed.

## 2016-02-17 ENCOUNTER — Ambulatory Visit (INDEPENDENT_AMBULATORY_CARE_PROVIDER_SITE_OTHER): Payer: Medicare Other | Admitting: Family Medicine

## 2016-02-17 VITALS — BP 160/80 | HR 86 | Temp 98.2°F | Resp 20 | Ht 69.5 in | Wt 348.6 lb

## 2016-02-17 DIAGNOSIS — E1149 Type 2 diabetes mellitus with other diabetic neurological complication: Secondary | ICD-10-CM

## 2016-02-17 DIAGNOSIS — E785 Hyperlipidemia, unspecified: Secondary | ICD-10-CM | POA: Diagnosis not present

## 2016-02-17 DIAGNOSIS — I639 Cerebral infarction, unspecified: Secondary | ICD-10-CM | POA: Diagnosis not present

## 2016-02-17 DIAGNOSIS — N184 Chronic kidney disease, stage 4 (severe): Secondary | ICD-10-CM

## 2016-02-17 DIAGNOSIS — I5032 Chronic diastolic (congestive) heart failure: Secondary | ICD-10-CM

## 2016-02-17 DIAGNOSIS — E1122 Type 2 diabetes mellitus with diabetic chronic kidney disease: Secondary | ICD-10-CM | POA: Diagnosis not present

## 2016-02-17 DIAGNOSIS — IMO0002 Reserved for concepts with insufficient information to code with codable children: Secondary | ICD-10-CM

## 2016-02-17 DIAGNOSIS — I10 Essential (primary) hypertension: Secondary | ICD-10-CM | POA: Diagnosis not present

## 2016-02-17 DIAGNOSIS — E1165 Type 2 diabetes mellitus with hyperglycemia: Secondary | ICD-10-CM

## 2016-02-17 DIAGNOSIS — R4701 Aphasia: Secondary | ICD-10-CM | POA: Diagnosis not present

## 2016-02-17 DIAGNOSIS — D509 Iron deficiency anemia, unspecified: Secondary | ICD-10-CM | POA: Diagnosis not present

## 2016-02-17 DIAGNOSIS — G4733 Obstructive sleep apnea (adult) (pediatric): Secondary | ICD-10-CM

## 2016-02-17 LAB — POCT GLYCOSYLATED HEMOGLOBIN (HGB A1C): HEMOGLOBIN A1C: 6.7

## 2016-02-17 NOTE — Patient Instructions (Addendum)
We may need to consider putting you back on lantus if your blood sugar gets any worse. I think you should likely be on atorvastatin (lipitor), plavix, and bidil. Also I am wondering what happened to the cabergoline and if you need to be on this or not. Are you taking iron supplement (ferrous?) if so how much.  you are taking aspirin.    I need to talk with [name] and Dr. Einar Gip and get their notes and labs to see what you need to be on.  We will call you in about a week to make sure you have the correct medications.  It is essential that you bring all of your medicines in with you to every visit - bring EVERYTHING you have at home - old medicines, supplements, vitamins, etc.    IF you received an x-ray today, you will receive an invoice from [facility]. Please contact [facility] at [phone] with questions or concerns regarding your invoice.   IF you received labwork today, you will receive an invoice from [employer]. Please contact [facility] at [phone] with questions or concerns regarding your invoice.   Our billing staff will not be able to assist you with questions regarding bills from these companies.  You will be contacted with the lab results as soon as they are available. The fastest way to get your results is to activate your My Chart account. Instructions are located on the last page of this paperwork. If you have not heard from [place] regarding the results in 2 weeks, please contact this office.    Diabetes Mellitus and Food It is important for you to manage your blood sugar (glucose) level. Your blood glucose level can be greatly affected by what you eat. Eating healthier foods in the appropriate amounts throughout the day at about the same time each day will help you control your blood glucose level. It can also help slow or prevent worsening of your diabetes mellitus. Healthy eating may even help you improve the level of your  blood pressure and reach or maintain a healthy weight.  General recommendations for healthful eating and cooking habits include:  Eating meals and snacks regularly. Avoid going long periods of time without eating to lose weight.  Eating a diet that consists mainly of plant-based foods, such as fruits, vegetables, nuts, legumes, and whole grains.  Using low-heat cooking methods, such as baking, instead of high-heat cooking methods, such as deep frying. Work with your dietitian to make sure you understand how to use the Nutrition Facts information on food labels. HOW CAN FOOD AFFECT ME? Carbohydrates Carbohydrates affect your blood glucose level more than any other type of food. Your dietitian will help you determine how many carbohydrates to eat at each meal and teach you how to count carbohydrates. Counting carbohydrates is important to keep your blood glucose at a healthy level, especially if you are using insulin or taking certain medicines for diabetes mellitus. Alcohol Alcohol can cause sudden decreases in blood glucose (hypoglycemia), especially if you use insulin or take certain medicines for diabetes mellitus. Hypoglycemia can be a life-threatening condition. Symptoms of hypoglycemia (sleepiness, dizziness, and disorientation) are similar to symptoms of having too much alcohol.  If your health care provider has given you approval to drink alcohol, do so in moderation and use the following guidelines:  Women should not have more than one drink per day, and men should not have more than two drinks per day. One drink is equal  to:  12 oz of beer.  5 oz of wine.  1 oz of hard liquor.  Do not drink on an empty stomach.  Keep yourself hydrated. Have water, diet soda, or unsweetened iced tea.  Regular soda, juice, and other mixers might contain a lot of carbohydrates and should be counted. WHAT FOODS ARE NOT RECOMMENDED? As you make food choices, it is important to remember that all  foods are not the same. Some foods have fewer nutrients per serving than other foods, even though they might have the same number of calories or carbohydrates. It is difficult to get your body what it needs when you eat foods with fewer nutrients. Examples of foods that you should avoid that are high in calories and carbohydrates but low in nutrients include:  Trans fats (most processed foods list trans fats on the Nutrition Facts label).  Regular soda.  Juice.  Candy.  Sweets, such as cake, pie, doughnuts, and cookies.  Fried foods. WHAT FOODS CAN I EAT? Eat nutrient-rich foods, which will nourish your body and keep you healthy. The food you should eat also will depend on several factors, including:  The calories you need.  The medicines you take.  Your weight.  Your blood glucose level.  Your blood pressure level.  Your cholesterol level. You should eat a variety of foods, including:  Protein.  Lean cuts of meat.  Proteins low in saturated fats, such as fish, egg whites, and beans. Avoid processed meats.  Fruits and vegetables.  Fruits and vegetables that may help control blood glucose levels, such as apples, mangoes, and yams.  Dairy products.  Choose fat-free or low-fat dairy products, such as milk, yogurt, and cheese.  Grains, bread, pasta, and rice.  Choose whole grain products, such as multigrain bread, whole oats, and brown rice. These foods may help control blood pressure.  Fats.  Foods containing healthful fats, such as nuts, avocado, olive oil, canola oil, and fish. DOES EVERYONE WITH DIABETES MELLITUS HAVE THE SAME MEAL PLAN? Because every person with diabetes mellitus is different, there is not one meal plan that works for everyone. It is very important that you meet with a dietitian who will help you create a meal plan that is just right for you.   This information is not intended to replace advice given to you by your health care provider. Make sure  you discuss any questions you have with your health care provider.   Document Released: 02/28/2005 Document Revised: 06/24/2014 Document Reviewed: 04/30/2013 Elsevier Interactive Patient Education 2016 [place] for Eating Away From Home If You Have Diabetes Controlling your level of blood glucose, also known as blood sugar, can be challenging. It can be even more difficult when you do not prepare your own meals. The following tips can help you manage your diabetes when you eat away from home. PLANNING AHEAD Plan ahead if you know you will be eating away from home:  Ask your health care provider how to time meals and medicine if you are taking insulin.  Make a list of restaurants near you that offer healthy choices. If they have a carry-out menu, take it home and plan what you will order ahead of time.  Look up the restaurant you want to eat at online. Many chain and fast-food restaurants list nutritional information online. Use this information to choose the healthiest options and to calculate how many carbohydrates will be in your meal.  Use a carbohydrate-counting book or mobile  app to look up the carbohydrate content and serving size of the foods you want to eat.  Become familiar with serving sizes and learn to recognize how many servings are in a portion. This will allow you to estimate how many carbohydrates you can eat. FREE FOODS A "free food" is any food or drink that has less than 5 g of carbohydrates per serving. Free foods include:  Many vegetables.  Hard boiled eggs.  Nuts or seeds.  Olives.  Cheeses.  Meats. These types of foods make good appetizer choices and are often available at salad bars. Lemon juice, vinegar, or a low-calorie salad dressing of fewer than 20 calories per serving can be used as a "free" salad dressing.  CHOICES TO REDUCE CARBOHYDRATES  Substitute nonfat sweetened yogurt with a sugar-free yogurt. Yogurt made from soy milk may also be  used, but you will still want a sugar-free or plain option to choose a lower carbohydrate amount.  Ask your server to take away the bread basket or chips from your table.  Order fresh fruit. A salad bar often offers fresh fruit choices. Avoid canned fruit because it is usually packed in sugar or syrup.  Order a salad, and eat it without dressing. Or, create a "free" salad dressing.  Ask for substitutions. For example, instead of [place] fries, request an order of a vegetable such as salad, green beans, or broccoli. OTHER TIPS   If you take insulin, take the insulin once your food arrives to your table. This will ensure your insulin and food are timed correctly.  Ask your server about the portion size before your order, and ask for a take-out box if the portion has more servings than you should have. When your food comes, leave the amount you should have on the plate, and put the rest in the take-out box.  Consider splitting an entree with someone and ordering a side salad.   This information is not intended to replace advice given to you by your health care provider. Make sure you discuss any questions you have with your health care provider.   Document Released: 06/03/2005 Document Revised: 02/22/2015 Document Reviewed: 08/31/2013 Elsevier Interactive Patient Education [employer].

## 2016-02-17 NOTE — Progress Notes (Signed)
Subjective:    Patient ID: Blake Ina Sr., male    DOB: May 12, 1951, 65 y.o.   MRN: [id number] By signing my name below, I, Blake Bell, attest that this documentation has been prepared under the direction and in the presence of Blake Bell. Electronically Signed: Judithe Bell, ER Scribe. 02/17/2016. 9:22 AM.  Chief Complaint  Patient presents with  . Follow-up    on medication; patient states he takes about 5 diff medications but does not know the name of them.  . Other    could not review med list with patient    HPI HPI Comments: Blake Bowsher Sr. is a 65 y.o. male who presents to Blake Bell reporting for a regular visit. Today he states he has had four strokes and a seizure. His next visit with Blake Bell is in 6 mos, and Blake Bell every 3 mos. He has not started on dialysis. He states his feet and legs are doing well. He still drives. His wife checks his blood pressure. He does not have home health at home. He has not had any falls. He has one wound on his right leg that is being worked on by wound care Bell at [facility]. He goes there once per week. He states his wound is nearly healed. He denies increased breathing troubles at night. He is wearing his CPAP at night. He normally walks in the house without a cane. He is making his own food while his wife is out of town for the week. She gets home today. He is not taking any blood sugar medication. The doctor stopped those medications. Either Dr. Florene Bell or Blake Bell stopped that medication. His brain tumor medication was last prescribed by Blake Bell and he states that he is not taking that medication. He is not taking Lipitor because he believes that a doctor told him not to. He is taking Bayer Aspirin.  Blake Bell is a very complex and non compliant patient. I met this patient over one year ago, at which time I recommended he be cared for by a geriatrician due to his complex medical hx and referred him to [facility]. I discussed transfer of care with the office myself and they accepted care, though he never scheduled a visit. Pt has stage four chrinic kidney disease followed by Dr. Florene Bell, and had an AV fistula created in his left arm six months prior. Pt has had recurrent CVAs in 2015-16 which presumably caused problems with mentation and contributes to his medical non compliance. He is not a candidate for a renal transplant due to severe obesity. Last labs on 04/20 showed creatinine of 4.8 with GFR or 18. He has anemia secondary to CKD with a hemoglobin of 9.9. The last it appears pt saw his cardiologist Blake Bell several days prior, note not available. Pt also has CHF with chronic core pulmonale. Due to his stroke he is trouble with word finding and so difficult to gather information from, and he is here alone today. He has OSA on CPAP. He is not compliant with diet recommendations. Last echo done 09/2014 showed EF 38-45%. The last cardiology note pt was unable to make any changes to his meds because he was so non compliant with dietary changes. His lipids are uncontrolled despite max dose statins, and Blake Bell did not want to add zedia due to potential risk factors. He is on plavix and aspirin. For his BP he is on bidil 20-37.5 two tabs TID,  amlodopine 10, metolazone 5 QD, with potassium 20 BID, labetalol. And blood pressure also remained uncontrolled. Most recent note from nephrology was also on 04/20. Pt's wife is Blake Bell and has four children in town who help care for him. It appears he has 3+ pedal edema at baseline. Last hemoglobin A1C was 6.1 six mos prior. Pt also has a hx of prolactin secreting pituitary adenoma and has been prescribed cabergoline. Prolactin was unreadable over 1000 when initially diagnosed three years ag,o but was 10, 8 mos prior with normal findings.   Past Medical History:  Diagnosis Date  . Chronic kidney disease (CKD), stage III (moderate)    sees dr Blake Bell nephrology  every 6-9 months  . CVA (cerebral vascular accident) [facility]) 12/2013   admitted in July 2015 for acute right-sided infarct/notes 12/02/2014  . Hypercholesterolemia   . Hypertension   . Multiple wounds    on lower legs - sees Blake Bell at the [facility]  . OSA on CPAP    uses cpap, setting of 5  . Prolactin secreting pituitary adenoma (Blake Bell)    Blake Bell 12/02/2014  . Stroke ([place]) 12/02/2014   expressive aphasia  . Systolic CHF ([place])   . Type II diabetes mellitus (HCC)    uncontrolled/notes 12/02/2014   No Known Allergies  Current Outpatient Prescriptions on File Prior to Visit  Medication Sig Dispense Refill  . amLODipine (NORVASC) 10 MG tablet Take 1 tablet (10 mg total) by mouth daily. 30 tablet 0  . Albiglutide (TANZEUM) 30 MG PEN Inject 30 mg into the skin once a week. 1 each 2  . aspirin 325 MG tablet Take 325 mg by mouth daily.    Marland Kitchen atorvastatin (LIPITOR) 80 MG tablet TAKE 1 TABLET AT 6PM. 90 tablet 3  . B-D ULTRAFINE III SHORT PEN 31G X 8 MM MISC USE WITH SOLOSTAR AND NOVOLOG 100 each 1  . BIDIL 20-37.5 MG tablet TAKE (2) TABLETS THREE TIMES DAILY. 540 tablet 1  . clopidogrel (PLAVIX) 75 MG tablet TAKE 1 TABLET BY MOUTH DAILY WITH BREAKFAST 30 tablet 0  . escitalopram (LEXAPRO) 10 MG tablet Take 1 tablet by mouth daily  1  . ferrous sulfate 325 (65 FE) MG tablet Take 1 tablet (325 mg total) by mouth 2 (two) times daily with a meal. 60 tablet 0  . labetalol (NORMODYNE) 300 MG tablet Take 300 mg by mouth 3 (three) times daily.  11  . metolazone (ZAROXOLYN) 5 MG tablet Take 1 tablet (5 mg total) by mouth daily. 30 tablet 0  . oxyCODONE (ROXICODONE) 5 MG immediate release tablet Take 1 tablet (5 mg total) by mouth every 6 (six) hours as needed. 20 tablet 0  . potassium chloride SA (K-DUR,KLOR-CON) 20 MEQ tablet TAKE 1 TABLET (20 MEQ) BY MOUTH TWICE DAILY 60 tablet 0  . potassium chloride SA (K-DUR,KLOR-CON) 20 MEQ tablet TAKE 2 TABLETS(40 MEQ) BY MOUTH DAILY 60 tablet 0  . Syringe,  Disposable, 1 ML MISC Insulin syringes as needed to go with medications - lantus and tanzeum 200 each 11   No current facility-administered medications on file prior to visit.    Depression screen [facility] 2/9 02/17/2016 07/11/2015 07/07/2015 07/05/2015 06/28/2015  Decreased Interest 0 0 0 0 0  Down, Depressed, Hopeless 0 0 0 0 0  PHQ - 2 Score 0 0 0 0 0     Review of Systems  Constitutional: Negative for activity change, appetite change and unexpected weight change.  Cardiovascular: Positive for leg swelling.  Musculoskeletal: Positive for arthralgias and gait problem.  Skin: Positive for color change and wound.  Neurological: Positive for weakness. Negative for facial asymmetry.  Psychiatric/Behavioral: Negative for decreased concentration, dysphoric mood and sleep disturbance.      Objective:   Physical Exam  Constitutional: He appears well-developed and well-nourished. No distress.  HENT:  Head: Normocephalic and atraumatic.  Eyes: Pupils are equal, round, and reactive to light.  Neck: Neck supple.  Cardiovascular: Normal rate and regular rhythm.   Heart with RRR, heart sounds difficult to hear, 2/6 systolic murmur LUSB.   Pulmonary/Chest: Effort normal. No respiratory distress.  Inspiratory and expiratory rhonchi throughout, likely exacerbated by upper airway.   Musculoskeletal: Normal range of motion.  Neurological: He is alert. He is not disoriented. He displays atrophy. He exhibits abnormal muscle tone. Gait abnormal.  Expressive aphasia  Skin: Skin is warm and dry. He is not diaphoretic.  Psychiatric: He has a normal mood and affect. His behavior is normal.  Nursing note and vitals reviewed.   BP (!) 160/80 (BP Location: Right Arm, Patient Position: Sitting, Cuff Size: Large)   Pulse 86   Temp 98.2 F (36.8 C) (Oral)   Resp 20   Ht 5' 9.5" (1.765 m)   Wt (!) 348 lb 9.6 oz (158.1 kg)   BMI 50.74 kg/m      Results for orders placed or performed in visit on 02/17/16    POCT glycosylated hemoglobin (Hb A1C)  Result Value Ref Range   Hemoglobin A1C 6.7     Assessment & Plan:   Requested med refill hx from pharmacy, and pt has has not been filling the tanzium, Lipitor, bidil, and plavix.  Do not know if pt is taking lexapro (which was rx'd by cardiology Dr. Einar Gip?)  Also do not know if he is still supposed to be on iron.   1. Essential hypertension, benign   2. Chronic diastolic heart failure, NYHA class 2 ([place])   3. Type II diabetes mellitus with neurological manifestations ([place]) - may need to restart lantus if a1c increases further. Used to be on once wkly tanzium  4. Uncontrolled type 2 diabetes mellitus with stage 4 chronic kidney disease, without long-term current use of insulin ([place])   5. OSA (obstructive sleep apnea)   6. Cerebral infarction due to unspecified mechanism   7. Anemia, iron deficiency   8. HLD (hyperlipidemia)   9. Expressive aphasia   10. Pituitary tumor - unsure if pt still needs to be on cabergoline  Per our med list reconciled with cards and nephrology c/s notes from April - 4 mos prior -, pt should be on atorvastatin, plavix, and bidil. However list of meds filled at pharmacy over the past yr (faxed from [facility] today) do not show that he has had any of these meds. Pt completely unaware of what he is taking and is here alone today - drives himself.  Pt reports he has seen cards (Dr. Einar Gip) and nephrology (Dr. Florene Bell) within the past mo w/ labs done.  Will call for most recent lab work from cariology and nephrology this week.  Will wait to get most recent c/s notes before restarting these meds in case one of his specialists intentionally stopped med.  Reminded pt that he needs to bring all of his medications to every office visit.  Orders Placed This Encounter  Procedures  . POCT glycosylated hemoglobin (Hb A1C)    Meds ordered this encounter  Medications  . DISCONTD: calcitRIOL (ROCALTROL) 0.25  MCG capsule    Sig: Take  0.25 mcg by mouth daily.  . calcitRIOL (ROCALTROL) 0.5 MCG capsule    Sig: Take 0.5 mcg by mouth daily.    Refill:  6   Over 40 min spent in face-to-face evaluation of and consultation with patient and coordination of care.  Over 50% of this time was spent counseling this patient.   I personally performed the services described in this documentation, which was scribed in my presence. The recorded information has been reviewed and considered, and addended by me as needed.   Blake Bell, M.D.  [facility] [address] [place], [place] [address] [phone] phone [phone] fax  02/20/16 11:03 PM

## 2016-02-20 ENCOUNTER — Encounter (HOSPITAL_BASED_OUTPATIENT_CLINIC_OR_DEPARTMENT_OTHER): Payer: Medicare Other | Attending: Surgery

## 2016-02-20 DIAGNOSIS — L97812 Non-pressure chronic ulcer of other part of right lower leg with fat layer exposed: Secondary | ICD-10-CM | POA: Diagnosis not present

## 2016-02-20 DIAGNOSIS — I87311 Chronic venous hypertension (idiopathic) with ulcer of right lower extremity: Secondary | ICD-10-CM | POA: Diagnosis not present

## 2016-02-20 DIAGNOSIS — E11622 Type 2 diabetes mellitus with other skin ulcer: Secondary | ICD-10-CM | POA: Diagnosis not present

## 2016-02-20 DIAGNOSIS — L97212 Non-pressure chronic ulcer of right calf with fat layer exposed: Secondary | ICD-10-CM | POA: Insufficient documentation

## 2016-02-20 DIAGNOSIS — I890 Lymphedema, not elsewhere classified: Secondary | ICD-10-CM | POA: Diagnosis not present

## 2016-02-20 DIAGNOSIS — I10 Essential (primary) hypertension: Secondary | ICD-10-CM | POA: Insufficient documentation

## 2016-02-26 ENCOUNTER — Other Ambulatory Visit: Payer: Self-pay | Admitting: Physician Assistant

## 2016-02-27 DIAGNOSIS — I87311 Chronic venous hypertension (idiopathic) with ulcer of right lower extremity: Secondary | ICD-10-CM | POA: Diagnosis not present

## 2016-02-29 NOTE — Telephone Encounter (Signed)
Dr Brigitte Pulse, you wrote this note at 9/2 OV so wasn't sure whether to RF this med or not:  Per our med list reconciled with cards and nephrology c/s notes from April - 4 mos prior -, pt should be on atorvastatin, plavix, and bidil. However list of meds filled at pharmacy over the past yr (faxed from [facility] today) do not show that he has had any of these meds. Pt completely unaware of what he is taking and is here alone today - drives himself.  Pt reports he has seen cards (Dr. Einar Gip) and nephrology ([name]) within the past mo w/ labs done.  Will call for most recent lab work from cariology and nephrology this week.  Will wait to get most recent c/s notes before restarting these meds in case one of his specialists intentionally stopped med.

## 2016-03-01 NOTE — Telephone Encounter (Signed)
This is weird as the request states he had it filled on 01/11/16 so not sure why I didn't see it on the faxed [facility] list at time of visit.  Yes, will def keep on plavix if he has been taking it.

## 2016-03-05 DIAGNOSIS — I87311 Chronic venous hypertension (idiopathic) with ulcer of right lower extremity: Secondary | ICD-10-CM | POA: Diagnosis not present

## 2016-03-12 ENCOUNTER — Ambulatory Visit (INDEPENDENT_AMBULATORY_CARE_PROVIDER_SITE_OTHER): Payer: Medicare Other | Admitting: Family Medicine

## 2016-03-12 VITALS — BP 152/80 | HR 82 | Temp 98.3°F | Resp 16 | Ht 69.25 in | Wt 346.4 lb

## 2016-03-12 DIAGNOSIS — I87311 Chronic venous hypertension (idiopathic) with ulcer of right lower extremity: Secondary | ICD-10-CM | POA: Diagnosis not present

## 2016-03-12 DIAGNOSIS — Z5181 Encounter for therapeutic drug level monitoring: Secondary | ICD-10-CM

## 2016-03-12 DIAGNOSIS — R350 Frequency of micturition: Secondary | ICD-10-CM | POA: Diagnosis not present

## 2016-03-12 DIAGNOSIS — N184 Chronic kidney disease, stage 4 (severe): Secondary | ICD-10-CM

## 2016-03-12 DIAGNOSIS — R351 Nocturia: Secondary | ICD-10-CM | POA: Diagnosis not present

## 2016-03-12 DIAGNOSIS — I639 Cerebral infarction, unspecified: Secondary | ICD-10-CM

## 2016-03-12 LAB — COMPLETE METABOLIC PANEL WITH GFR
ALK PHOS: 63 U/L (ref 40–115)
ALT: 10 U/L (ref 9–46)
AST: 13 U/L (ref 10–35)
Albumin: 3.5 g/dL — ABNORMAL LOW (ref 3.6–5.1)
BILIRUBIN TOTAL: 0.3 mg/dL (ref 0.2–1.2)
BUN: 74 mg/dL — ABNORMAL HIGH (ref 7–25)
CALCIUM: 9.4 mg/dL (ref 8.6–10.3)
CHLORIDE: 104 mmol/L (ref 98–110)
CO2: 22 mmol/L (ref 20–31)
CREATININE: 5.01 mg/dL — AB (ref 0.70–1.25)
GFR, EST AFRICAN AMERICAN: 13 mL/min — AB (ref >=60)
GFR, Est Non African American: 11 mL/min — ABNORMAL LOW (ref >=60)
Glucose, Bld: 142 mg/dL — ABNORMAL HIGH (ref 65–99)
Potassium: 3.8 mmol/L (ref 3.5–5.3)
Sodium: 141 mmol/L (ref 135–146)
TOTAL PROTEIN: 7.4 g/dL (ref 6.1–8.1)

## 2016-03-12 LAB — CBC WITH DIFFERENTIAL/PLATELET
Basophils Absolute: 104 cells/uL (ref 0–200)
Basophils Relative: 1 %
EOS PCT: 3 %
Eosinophils Absolute: 312 cells/uL (ref 15–500)
HCT: 30.2 % — ABNORMAL LOW (ref 38.5–50.0)
Hemoglobin: 9.8 g/dL — ABNORMAL LOW (ref 13.2–17.1)
LYMPHS PCT: 20 %
Lymphs Abs: 2080 cells/uL (ref 850–3900)
MCH: 28.6 pg (ref 27.0–33.0)
MCHC: 32.5 g/dL (ref 32.0–36.0)
MCV: 88 fL (ref 80.0–100.0)
MONOS PCT: 7 %
MPV: 9.6 fL (ref 7.5–12.5)
Monocytes Absolute: 728 cells/uL (ref 200–950)
NEUTROS PCT: 69 %
Neutro Abs: 7176 cells/uL (ref 1500–7800)
Platelets: 423 10*3/uL — ABNORMAL HIGH (ref 140–400)
RBC: 3.43 MIL/uL — AB (ref 4.20–5.80)
RDW: 18.1 % — AB (ref 11.0–15.0)
WBC: 10.4 10*3/uL (ref 3.8–10.8)

## 2016-03-12 LAB — POCT URINALYSIS DIP (MANUAL ENTRY)
Bilirubin, UA: NEGATIVE
Glucose, UA: 100 — AB
Ketones, POC UA: NEGATIVE
LEUKOCYTES UA: NEGATIVE
Nitrite, UA: NEGATIVE
SPEC GRAV UA: 1.025
UROBILINOGEN UA: 0.2
pH, UA: 5.5

## 2016-03-12 LAB — POC MICROSCOPIC URINALYSIS (UMFC): MUCUS RE: ABSENT

## 2016-03-12 LAB — PSA: PSA: 0.2 ng/mL (ref <=4.0)

## 2016-03-12 NOTE — Patient Instructions (Addendum)
Please make an appointment for an Annual Wellness Visit with me at no cost to you sometime in the next 3 months.   IF you received an x-ray today, you will receive an invoice from [facility]. Please contact [facility] at [phone] with questions or concerns regarding your invoice.   IF you received labwork today, you will receive an invoice from [employer]. Please contact [facility] at [phone] with questions or concerns regarding your invoice.   Our billing staff will not be able to assist you with questions regarding bills from these companies.  You will be contacted with the lab results as soon as they are available. The fastest way to get your results is to activate your My Chart account. Instructions are located on the last page of this paperwork. If you have not heard from [place] regarding the results in 2 weeks, please contact this office.    Food Basics for Chronic Kidney Disease When your kidneys are not working well, they cannot remove waste and excess substances from your blood as effectively as they did before. This can lead to a buildup and imbalance of these substances, which can affect how your body functions. This buildup can also make your kidneys work harder, causing even more damage. You may need to eat less of certain foods that can lead to the buildup of these substances in your body. By making the changes to your diet that are recommended by your dietitian or health care provider, you could possibly help prevent further kidney damage and delay or prevent the need for dialysis. The following information can help give you a basic understanding of these substances and how they affect your bodily functions. The information also gives examples of foods that contain the highest amounts of these substances. WHAT DO I NEED TO KNOW ABOUT SUBSTANCES IN MY FOOD THAT I MAY NEED TO ADJUST? Food adjustments will be different for each person  with chronic kidney disease. It is important that you see a dietitian who can help you determine the specific adjustments that you will need to make for each of the following substances: Potassium Potassium affects how steadily your heart beats. If too much potassium builds up in your blood, it can cause an irregular heartbeat or even a heart attack. Examples of foods rich in potassium include:  Milk.  Fruits.  Vegetables. Phosphorus Phosphorus is a mineral found in your bones. A balance between calcium and phosphorous is needed to build and maintain healthy bones. Too much phosphorus pulls calcium from your bones. This can make your bones weak and more likely to break. Too much phosphorus can also make your skin itch. Examples of foods rich in phosphorus include:  Milk and cheese.  Dried beans.  Peas.  Colas.  Nuts and peanut butter. Animal Protein Animal protein helps you make and keep muscle. It also helps in the repair of your body's cells and tissues. One of the natural breakdown products of protein is a waste product called urea. When your kidneys are not working properly, they cannot remove wastes such as urea like they did before you developed chronic kidney disease. You will likely need to limit the amount of protein you eat to help prevent a buildup of urea in your blood. Examples of animal protein include:  Meat (all types).  Fish and seafood.  Poultry.  Eggs. Sodium Sodium, which is found in salt, helps maintain a healthy balance of fluids in your body. Too much sodium can increase  your blood pressure level and have a negative affect on the function of your heart and lungs. Too much sodium also can cause your body to retain too much fluid, making your kidneys work harder. Examples of foods with high levels of sodium include:  Salt seasonings.  Soy sauce.  Cured and processed meats.  Salted crackers and snack foods.  Fast food.  Canned soups and most canned  foods. Glucose Glucose provides energy for your body. If you have diabetes mellitus that is not properly controlled, you have too much glucose in your blood. Too much glucose in your blood can worsen the function of your kidneys by damaging small blood vessels. This prevents enough blood flow to your kidneys to give them what they need to work. If you have diabetes mellitus and chronic kidney disease, it is important to maintain your blood glucose at a level recommended by your health care provider. SHOULD I TAKE A VITAMIN AND MINERAL SUPPLEMENT? Because you may need to avoid eating certain foods, you may not get all of the vitamins and minerals that would normally come from those foods. Your health care provider or dietitian may recommend that you take a supplement to ensure that you get all of the vitamins and minerals that your body needs.    This information is not intended to replace advice given to you by your health care provider. Make sure you discuss any questions you have with your health care provider.   Document Released: 08/24/2002 Document Revised: 06/24/2014 Document Reviewed: 04/30/2013 Elsevier Interactive Patient Education [employer].

## 2016-03-12 NOTE — Progress Notes (Signed)
By signing my name below, I, Mesha Guinyard, attest that this documentation has been prepared under the direction and in the presence of Norberto Sorenson, MD.  Electronically Signed: Arvilla Market, Medical Scribe. 03/12/16. 1:09 PM.  Subjective:    Patient ID: Blake Humphrey Sr., male    DOB: 04/23/1951, 65 y.o.   MRN: [id number]  HPI  Chief Complaint  Patient presents with  . Follow-up    meds    HPI Comments: Blake Newey Sr. is a 65 y.o. male who presents to the [facility] complaining of medication follow-up. Pt has a multiple CVAs and stage 4 CKD. Pt lives at home with is wife, who is his main care taker. He suffers from expressive aphasia and at his last visit 3 weeks prior, it was the first time I had seen him in several years. His wife was out of states visiting her sick dad in [place] so he attended alone. He was unaware of the names of his medications and didn't bring them with him. He has multiple specialist including nephology and cardiology on other medical record systems leading to complicated and inaccurate medication list. We contacted his pharmacy to determine what he was actually filling and taking which did not seem to be complete. There seems to be multiple medications that he should have been on which he was not taking but we didn't have the most recent consult notes to confirm and these still haven't been received.  Pt hasn't been taking Tanzeum or cabergoline for years. Pt isn't taking calcitriol because it's too expensive to buy regularly. Pt was taking doxycycline from the wound center. Pt will see Dr. Lowell Guitar every 4 months, and will be seeing Dr. Jacinto Halim every 3 months beginning in March 2018. Pt mentions he has lost weight but doesn't weigh himself everyday. Pt states he gets a good night of sleep Pt is sleeping a lot, and he wakes up a lot at night from noctouria with little urine output.. Pt drinks less water so he doesn't use the bathroom during  the day. Pt hasn't gone to a urologist and he hasn't had blood work in a couple of months.  Pt is taking: Labetalol 300 mg TIB (morning, evening, and night) by Dr. Lowell Guitar Lexapro 10 mg QD from Dr. Jacinto Halim Atorvastatin by Dr. Clelia Croft QD (evening). Potassium 20 mg BID by Dr. Casimiro Needle (morning anight) Plavix 75 mg QD by me ASA 325 mg Amlodipine 10 mg by Dr. Jacinto Halim Bidil Doxycycline BID x2 weeks by Dr. Meyer Russel. Metolazone 5 mg every afternoon - need to check with cards  Wt Readings from Last 3 Encounters:  03/12/16 (!) 346 lb 6.4 oz (157.1 kg)  02/17/16 (!) 348 lb 9.6 oz (158.1 kg)  11/20/15 (!) 348 lb 12.8 oz (158.2 kg)    Patient Active Problem List   Diagnosis Date Noted  . Chronic kidney disease (CKD), stage IV (severe) (HCC) 09/12/2015  . Chronic diastolic heart failure, NYHA class 2 (HCC) 08/08/2015  . Anemia, iron deficiency 06/04/2015  . Acute on chronic systolic (congestive) heart failure (HCC) 05/29/2015  . CHF exacerbation (HCC) 05/29/2015  . Uncontrolled diabetes with stage 4 chronic kidney disease GFR 15-29 (HCC) 12/28/2014  . Type II diabetes mellitus with neurological manifestations (HCC) 12/12/2014  . Acute ischemic left MCA stroke (HCC) 12/02/2014  . Type 2 diabetes mellitus with complication (HCC) 03/24/2014  . Essential hypertension, benign 03/24/2014  . HLD (hyperlipidemia) 03/24/2014  . CVA (cerebral infarction) 01/13/2014  . AKI (  acute kidney injury) ([place]) 07/03/2013  . Hypernatremia 07/03/2013  . Morbid obesity ([place]) 07/03/2013  . Venous stasis ulcers ([place]) 07/03/2013  . DM (diabetes mellitus) type 2, uncontrolled, with ketoacidosis ([place]) 07/03/2013  . 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 Sellar or suprasellar mass 06/22/2013  . Acute renal failure superimposed on stage 4 chronic kidney disease ([place]) 06/22/2013  . Acute respiratory failure ([place]) 06/22/2013  . Seizure ([place]) 06/21/2013  . Altered mental status 06/21/2013  . DKA (diabetic ketoacidoses) ([place])  06/21/2013  . Hypercarbia 06/21/2013  . OSA (obstructive sleep apnea) 05/25/2013   Past Medical History:  Diagnosis Date  . Chronic kidney disease (CKD), stage III (moderate)    sees dr Florene Glen nephrology every 6-9 months  . CVA (cerebral vascular accident) [facility]) 12/2013   admitted in July 2015 for acute right-sided infarct/notes 12/02/2014  . Hypercholesterolemia   . Hypertension   . Multiple wounds    on lower legs - sees [name] Pain at the [facility]  . OSA on CPAP    uses cpap, setting of 5  . 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 Stroke ([place]) 12/02/2014   expressive aphasia  . Systolic CHF ([place])   . Type II diabetes mellitus ([place])    uncontrolled/notes 12/02/2014   Past Surgical History:  Procedure Laterality Date  . AV FISTULA PLACEMENT Left 09/27/2015   Procedure: RADIOCEPHALIC ARTERIOVENOUS (AV) FISTULA CREATION LEFT ARM;  Surgeon: Mal Misty, MD;  Location: [facility];  Service: Vascular;  Laterality: Left;  . COLONOSCOPY  6-7 yrs ago  . COLONOSCOPY N/A 04/05/2013   Procedure: COLONOSCOPY;  Surgeon: Juanita Craver, MD;  Location: [facility];  Service: Endoscopy;  Laterality: N/A;   No Known Allergies Prior to Admission medications   Medication Sig Start Date End Date Taking? Authorizing Provider  amLODipine (NORVASC) 10 MG tablet Take 1 tablet (10 mg total) by mouth daily. Patient taking differently: Take 10 mg by mouth daily.  12/27/14  Yes Shawnee Knapp, MD  aspirin 325 MG tablet Take 325 mg by mouth daily.   Yes Historical Provider, MD  atorvastatin (LIPITOR) 80 MG tablet TAKE 1 TABLET AT 6PM. 07/27/15  Yes Gay Filler Copland, MD  B-D ULTRAFINE III SHORT PEN 31G X 8 MM MISC USE WITH SOLOSTAR AND NOVOLOG 01/05/15  Yes Shawnee Knapp, MD  BIDIL 20-37.5 MG tablet TAKE (2) TABLETS THREE TIMES DAILY. 07/14/15  Yes Shawnee Knapp, MD  calcitRIOL (ROCALTROL) 0.5 MCG capsule Take 0.5 mcg by mouth daily. 02/05/16  Yes Estanislado Emms, MD  clopidogrel (PLAVIX) 75 MG  tablet TAKE 1 TABLET BY MOUTH DAILY WITH BREAKFAST 03/01/16  Yes Shawnee Knapp, MD  escitalopram (LEXAPRO) 10 MG tablet Take 1 tablet by mouth daily 04/28/15  Yes Adrian Prows, MD  ferrous sulfate 325 (65 FE) MG tablet Take 1 tablet (325 mg total) by mouth 2 (two) times daily with a meal. 06/04/15  Yes Nishant Dhungel, MD  labetalol (NORMODYNE) 300 MG tablet Take 300 mg by mouth 3 (three) times daily.  04/27/15  Yes Adrian Prows, MD  metolazone (ZAROXOLYN) 5 MG tablet Take 1 tablet (5 mg total) by mouth daily. Patient taking differently: Take 5 mg by mouth daily.  06/28/15  Yes Gay Filler Copland, MD  potassium chloride SA (K-DUR,KLOR-CON) 20 MEQ tablet TAKE 2 TABLETS(40 MEQ) BY MOUTH DAILY 12/04/15  Yes Gay Filler Copland, MD  Syringe, Disposable, 1 ML MISC Insulin syringes as needed to go with medications -  lantus and tanzeum 12/27/14  Yes Shawnee Knapp, MD   Social History   Social History  . Marital status: Married    Spouse name: N/A  . Number of children: 3  . Years of education: 12th    Occupational History  . part-time/full self emp.    Social History Main Topics  . Smoking status: Former Smoker    Packs/day: 0.25    Years: 2.00    Types: Cigarettes    Quit date: 06/17/1970  . Smokeless tobacco: Never Used  . Alcohol use No  . Drug use:     Types: Marijuana     Comment: H/O marijuana use x 1 many years ago  . Sexual activity: Yes   Other Topics Concern  . Not on file   Social History Narrative   Patient lives at home with his wife.   Patient right handed   Patient drinks soda's and coffee   Depression screen [facility] 2/9 03/12/2016 02/17/2016 07/11/2015 07/07/2015 07/05/2015  Decreased Interest 0 0 0 0 0  Down, Depressed, Hopeless 0 0 0 0 0  PHQ - 2 Score 0 0 0 0 0   Review of Systems  Constitutional: Positive for activity change and fatigue. Negative for appetite change, chills and fever.  Endocrine: Positive for polyuria (at night).  Genitourinary: Positive for frequency. Negative for  difficulty urinating, dysuria and enuresis.  Musculoskeletal: Positive for arthralgias and joint swelling.  Neurological: Positive for weakness and numbness.  Psychiatric/Behavioral: Positive for sleep disturbance. Negative for dysphoric mood. The patient is not nervous/anxious.    Objective:  Physical Exam  Constitutional: He appears well-developed and well-nourished. No distress.  HENT:  Head: Normocephalic and atraumatic.  Eyes: Conjunctivae are normal.  Neck: Neck supple.  Cardiovascular: Normal rate, regular rhythm and normal heart sounds.  Exam reveals no gallop and no friction rub.   No murmur heard. Pulmonary/Chest: Effort normal and breath sounds normal. No respiratory distress. He has no wheezes. He has no rales.  Neurological: He is alert.  Skin: Skin is warm and dry.  Psychiatric: He has a normal mood and affect. His behavior is normal.  Nursing note and vitals reviewed.  BP (!) 152/80 (BP Location: Right Arm, Patient Position: Sitting, Cuff Size: Large)   Pulse 82   Temp 98.3 F (36.8 C) (Oral)   Resp 16   Ht 5' 9.25" (1.759 m)   Wt (!) 346 lb 6.4 oz (157.1 kg)   SpO2 96%   BMI 50.79 kg/m    Results for orders placed or performed in visit on 03/12/16  POCT urinalysis dipstick  Result Value Ref Range   Color, UA yellow yellow   Clarity, UA clear clear   Glucose, UA =100 (A) negative   Bilirubin, UA negative negative   Ketones, POC UA negative negative   Spec Grav, UA 1.025    Blood, UA trace-lysed (A) negative   pH, UA 5.5    Protein Ur, POC >=300 (A) negative   Urobilinogen, UA 0.2    Nitrite, UA Negative Negative   Leukocytes, UA Negative Negative  POCT Microscopic Urinalysis ([facility])  Result Value Ref Range   WBC,UR,HPF,POC None None WBC/hpf   RBC,UR,HPF,POC None None RBC/hpf   Bacteria None None, Too numerous to count   Mucus Absent Absent   Epithelial Cells, UR Per Microscopy Few (A) None, Too numerous to count cells/hpf   Assessment & Plan:   1.  Urinary frequency   2. Nocturia   3. Medication monitoring  encounter   4. Chronic kidney disease (CKD), stage IV (severe) (HCC) - pt following with [name] at [facility] - advised pt that he needs to check with nephrology about iron supp. He has a dialysis fistula already made.  I'm concerned that his recent increase in sedation and urinary freq may be due to progression into ESRD.   Rec AWV in 3 mos.  Orders Placed This Encounter  Procedures  . Urine culture  . COMPLETE METABOLIC PANEL WITH GFR  . PSA  . CBC with Differential/Platelet  . Care order/instruction:    AVS and GO    Scheduling Instructions:     AVS and GO after labs  . POCT urinalysis dipstick  . POCT Microscopic Urinalysis ([facility])     I personally performed the services described in this documentation, which was scribed in my presence. The recorded information has been reviewed and considered, and addended by me as needed.   [name], M.D.  [facility] [address] [place], [place] [address] [phone] phone [phone] fax  03/17/16 11:19 PM

## 2016-03-13 LAB — URINE CULTURE: Organism ID, Bacteria: NO GROWTH

## 2016-03-19 ENCOUNTER — Encounter (HOSPITAL_BASED_OUTPATIENT_CLINIC_OR_DEPARTMENT_OTHER): Payer: Medicare Other | Attending: Surgery

## 2016-03-19 DIAGNOSIS — L97212 Non-pressure chronic ulcer of right calf with fat layer exposed: Secondary | ICD-10-CM | POA: Diagnosis not present

## 2016-03-19 DIAGNOSIS — I10 Essential (primary) hypertension: Secondary | ICD-10-CM | POA: Insufficient documentation

## 2016-03-19 DIAGNOSIS — I87311 Chronic venous hypertension (idiopathic) with ulcer of right lower extremity: Secondary | ICD-10-CM | POA: Diagnosis not present

## 2016-03-19 DIAGNOSIS — I890 Lymphedema, not elsewhere classified: Secondary | ICD-10-CM | POA: Insufficient documentation

## 2016-03-19 DIAGNOSIS — E11622 Type 2 diabetes mellitus with other skin ulcer: Secondary | ICD-10-CM | POA: Insufficient documentation

## 2016-03-19 LAB — GLUCOSE, CAPILLARY: GLUCOSE-CAPILLARY: 179 mg/dL — AB (ref 65–99)

## 2016-03-26 ENCOUNTER — Telehealth: Payer: Self-pay | Admitting: *Deleted

## 2016-03-26 DIAGNOSIS — E11622 Type 2 diabetes mellitus with other skin ulcer: Secondary | ICD-10-CM | POA: Diagnosis not present

## 2016-03-26 NOTE — Telephone Encounter (Signed)
??   Last labs were 9/26 and pt was given results by Davina on 10/9.

## 2016-03-26 NOTE — Telephone Encounter (Signed)
Patients wife called about lab results.   [phone]

## 2016-03-27 NOTE — Telephone Encounter (Signed)
May have been old message on machine.  Disregard last message.

## 2016-04-02 DIAGNOSIS — E11622 Type 2 diabetes mellitus with other skin ulcer: Secondary | ICD-10-CM | POA: Diagnosis not present

## 2016-04-09 DIAGNOSIS — E11622 Type 2 diabetes mellitus with other skin ulcer: Secondary | ICD-10-CM | POA: Diagnosis not present

## 2016-04-16 DIAGNOSIS — E11622 Type 2 diabetes mellitus with other skin ulcer: Secondary | ICD-10-CM | POA: Diagnosis not present

## 2016-04-23 ENCOUNTER — Encounter (HOSPITAL_BASED_OUTPATIENT_CLINIC_OR_DEPARTMENT_OTHER): Payer: Medicare Other | Attending: Surgery

## 2016-04-23 DIAGNOSIS — I87311 Chronic venous hypertension (idiopathic) with ulcer of right lower extremity: Secondary | ICD-10-CM | POA: Diagnosis not present

## 2016-04-23 DIAGNOSIS — L97212 Non-pressure chronic ulcer of right calf with fat layer exposed: Secondary | ICD-10-CM | POA: Diagnosis not present

## 2016-04-23 DIAGNOSIS — E11622 Type 2 diabetes mellitus with other skin ulcer: Secondary | ICD-10-CM | POA: Diagnosis not present

## 2016-04-23 DIAGNOSIS — I890 Lymphedema, not elsewhere classified: Secondary | ICD-10-CM | POA: Insufficient documentation

## 2016-04-23 DIAGNOSIS — I10 Essential (primary) hypertension: Secondary | ICD-10-CM | POA: Diagnosis not present

## 2016-04-30 DIAGNOSIS — E11622 Type 2 diabetes mellitus with other skin ulcer: Secondary | ICD-10-CM | POA: Diagnosis not present

## 2016-05-07 DIAGNOSIS — E11622 Type 2 diabetes mellitus with other skin ulcer: Secondary | ICD-10-CM | POA: Diagnosis not present

## 2016-05-14 DIAGNOSIS — E11622 Type 2 diabetes mellitus with other skin ulcer: Secondary | ICD-10-CM | POA: Diagnosis not present

## 2016-06-04 ENCOUNTER — Encounter (HOSPITAL_COMMUNITY): Payer: Self-pay

## 2016-06-04 ENCOUNTER — Emergency Department (HOSPITAL_COMMUNITY)
Admission: EM | Admit: 2016-06-04 | Discharge: 2016-06-05 | Disposition: A | Discharge status: Home / Self Care | Payer: Medicare Other | Attending: Emergency Medicine | Admitting: Emergency Medicine

## 2016-06-04 DIAGNOSIS — I5043 Acute on chronic combined systolic (congestive) and diastolic (congestive) heart failure: Secondary | ICD-10-CM | POA: Diagnosis not present

## 2016-06-04 DIAGNOSIS — Z7982 Long term (current) use of aspirin: Secondary | ICD-10-CM | POA: Diagnosis not present

## 2016-06-04 DIAGNOSIS — I130 Hypertensive heart and chronic kidney disease with heart failure and stage 1 through stage 4 chronic kidney disease, or unspecified chronic kidney disease: Secondary | ICD-10-CM | POA: Diagnosis not present

## 2016-06-04 DIAGNOSIS — E1149 Type 2 diabetes mellitus with other diabetic neurological complication: Secondary | ICD-10-CM | POA: Diagnosis not present

## 2016-06-04 DIAGNOSIS — G8929 Other chronic pain: Secondary | ICD-10-CM | POA: Diagnosis not present

## 2016-06-04 DIAGNOSIS — Z8673 Personal history of transient ischemic attack (TIA), and cerebral infarction without residual deficits: Secondary | ICD-10-CM | POA: Insufficient documentation

## 2016-06-04 DIAGNOSIS — N184 Chronic kidney disease, stage 4 (severe): Secondary | ICD-10-CM | POA: Insufficient documentation

## 2016-06-04 DIAGNOSIS — Z79899 Other long term (current) drug therapy: Secondary | ICD-10-CM | POA: Diagnosis not present

## 2016-06-04 DIAGNOSIS — R609 Edema, unspecified: Secondary | ICD-10-CM

## 2016-06-04 DIAGNOSIS — Z87891 Personal history of nicotine dependence: Secondary | ICD-10-CM | POA: Diagnosis not present

## 2016-06-04 DIAGNOSIS — R600 Localized edema: Secondary | ICD-10-CM | POA: Insufficient documentation

## 2016-06-04 DIAGNOSIS — E1122 Type 2 diabetes mellitus with diabetic chronic kidney disease: Secondary | ICD-10-CM | POA: Insufficient documentation

## 2016-06-04 DIAGNOSIS — R52 Pain, unspecified: Secondary | ICD-10-CM

## 2016-06-04 DIAGNOSIS — M7989 Other specified soft tissue disorders: Secondary | ICD-10-CM | POA: Diagnosis present

## 2016-06-04 MED ORDER — ENOXAPARIN SODIUM 150 MG/ML ~~LOC~~ SOLN
1.0000 mg/kg | Freq: Once | SUBCUTANEOUS | Status: AC
  Administered 2016-06-05: 150 mg via SUBCUTANEOUS
  Filled 2016-06-04: qty 0.99

## 2016-06-04 MED ORDER — METOLAZONE 5 MG PO TABS
5.0000 mg | ORAL_TABLET | Freq: Once | ORAL | Status: AC
  Administered 2016-06-05: 5 mg via ORAL
  Filled 2016-06-04: qty 1

## 2016-06-04 NOTE — ED Provider Notes (Signed)
[facility] DEPT Provider Note   CSN: [id number] Arrival date & time: 06/04/16  1759     History   Chief Complaint Chief Complaint  Patient presents with  . Leg Swelling    HPI Blake Zelazny Sr. is a 65 y.o. male.  Patient presents with complaint of right lower leg pain and acute on chronic swelling x 1-2 days. No fever. He had a wound to the lateral distal LE that was cared for by [facility] until November of this year when he was released and the patient was concerned for recurrence of the wound/infection. No SOB. No history of DVT/PE. Patient has chronic LE swelling and has not been taking his diuretics per wife. He was prescribed Zaroxolyn 5 mg.    The history is provided by the patient and the spouse. No language interpreter was used.    Past Medical History:  Diagnosis Date  . Chronic kidney disease (CKD), stage III (moderate)    sees dr Florene Glen nephrology every 6-9 months  . CVA (cerebral vascular accident) [facility]) 12/2013   admitted in July 2015 for acute right-sided infarct/notes 12/02/2014  . Hypercholesterolemia   . Hypertension   . Multiple wounds    on lower legs - sees [name] Pain at the [facility]  . OSA on CPAP    uses cpap, setting of 5  . 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 Stroke ([place]) 12/02/2014   expressive aphasia  . Systolic CHF ([place])   . Type II diabetes mellitus ([place])    uncontrolled/notes 12/02/2014    Patient Active Problem List   Diagnosis Date Noted  . Chronic kidney disease (CKD), stage IV (severe) ([place]) 09/12/2015  . Chronic diastolic heart failure, NYHA class 2 ([place]) 08/08/2015  . Anemia, iron deficiency 06/04/2015  . Acute on chronic systolic (congestive) heart failure 05/29/2015  . CHF exacerbation ([place]) 05/29/2015  . Uncontrolled diabetes with stage 4 chronic kidney disease GFR 15-29 ([place]) 12/28/2014  . Type II diabetes mellitus with neurological manifestations ([place]) 12/12/2014  .  Acute ischemic left MCA stroke ([place]) 12/02/2014  . Type 2 diabetes mellitus with complication ([place]) [id number]  . Essential hypertension, benign 03/24/2014  . HLD (hyperlipidemia) 03/24/2014  . CVA (cerebral infarction) 01/13/2014  . AKI (acute kidney injury) ([place]) 07/03/2013  . Hypernatremia 07/03/2013  . Morbid obesity ([place]) 07/03/2013  . Venous stasis ulcers ([place]) 07/03/2013  . DM (diabetes mellitus) type 2, uncontrolled, with ketoacidosis ([place]) 07/03/2013  . 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 Sellar or suprasellar mass 06/22/2013  . Acute renal failure superimposed on stage 4 chronic kidney disease ([place]) 06/22/2013  . Acute respiratory failure ([place]) 06/22/2013  . Seizure ([place]) 06/21/2013  . Altered mental status 06/21/2013  . DKA (diabetic ketoacidoses) ([place]) 06/21/2013  . Hypercarbia 06/21/2013  . OSA (obstructive sleep apnea) 05/25/2013    Past Surgical History:  Procedure Laterality Date  . AV FISTULA PLACEMENT Left 09/27/2015   Procedure: RADIOCEPHALIC ARTERIOVENOUS (AV) FISTULA CREATION LEFT ARM;  Surgeon: Mal Misty, MD;  Location: [facility];  Service: Vascular;  Laterality: Left;  . COLONOSCOPY  6-7 yrs ago  . COLONOSCOPY N/A 04/05/2013   Procedure: COLONOSCOPY;  Surgeon: Juanita Craver, MD;  Location: [facility];  Service: Endoscopy;  Laterality: N/A;       Home Medications    Prior to Admission medications   Medication Sig Start Date End Date Taking? Authorizing Provider  amLODipine (NORVASC) 10 MG tablet Take 1 tablet (10 mg  total) by mouth daily. Patient taking differently: Take 10 mg by mouth every evening.  12/27/14  Yes Shawnee Knapp, MD  aspirin 325 MG tablet Take 325 mg by mouth daily at 12 noon.    Yes Historical Provider, MD  atorvastatin (LIPITOR) 80 MG tablet TAKE 1 TABLET AT 6PM. 07/27/15  Yes Jessica C Copland, MD  BIDIL 20-37.5 MG tablet TAKE (2) TABLETS THREE TIMES DAILY. 07/14/15  Yes Shawnee Knapp, MD  calcitRIOL (ROCALTROL) 0.5 MCG capsule Take 0.5 mcg  by mouth daily. 02/05/16  Yes Estanislado Emms, MD  clopidogrel (PLAVIX) 75 MG tablet TAKE 1 TABLET BY MOUTH DAILY WITH BREAKFAST 03/01/16  Yes Shawnee Knapp, MD  escitalopram (LEXAPRO) 10 MG tablet Take 1 tablet by mouth daily in evening 04/28/15  Yes Adrian Prows, MD  labetalol (NORMODYNE) 300 MG tablet Take 300 mg by mouth 3 (three) times daily.  04/27/15  Yes Adrian Prows, MD  potassium chloride SA (K-DUR,KLOR-CON) 20 MEQ tablet TAKE 2 TABLETS(40 MEQ) BY MOUTH DAILY 12/04/15  Yes Gay Filler Copland, MD  metolazone (ZAROXOLYN) 5 MG tablet Take 1 tablet (5 mg total) by mouth daily. Patient not taking: Reported on 06/04/2016 06/28/15   Darreld Mclean, MD    Family History Family History  Problem Relation Age of Onset  . Diabetes Mother   . Hypertension Mother   . Diabetes Father   . Hypertension Father   . Asthma Son   . Diabetes Sister   . Diabetes Brother     Social History Social History  Substance Use Topics  . Smoking status: Former Smoker    Packs/day: 0.25    Years: 2.00    Types: Cigarettes    Quit date: 06/17/1970  . Smokeless tobacco: Never Used  . Alcohol use No     Allergies   Patient has no known allergies.   Review of Systems Review of Systems  Constitutional: Negative for chills and fever.  Respiratory: Negative.  Negative for shortness of breath.   Cardiovascular: Negative.  Negative for chest pain.  Musculoskeletal:       See HPI  Skin: Negative.  Negative for wound.  Neurological: Negative.  Negative for weakness and numbness.     Physical Exam Updated Vital Signs BP (!) 192/109   Pulse 72   Temp 98.4 F (36.9 C) (Oral)   Resp 18   Ht 5' 9.5" (1.765 m)   Wt (!) 149.2 kg   SpO2 97%   BMI 47.89 kg/m   Physical Exam  Constitutional: He is oriented to person, place, and time. He appears well-developed and well-nourished.  Neck: Normal range of motion.  Pulmonary/Chest: Effort normal.  Musculoskeletal: Normal range of motion.  Significant chronic  bilateral LE edema, right slightly more than left. No wound, skin breakdown, redness or warmth.   Neurological: He is alert and oriented to person, place, and time.  Skin: Skin is warm and dry.  Psychiatric: He has a normal mood and affect.     ED Treatments / Results  Labs (all labs ordered are listed, but only abnormal results are displayed) Labs Reviewed - No data to display  EKG  EKG Interpretation None       Radiology No results found.  Procedures Procedures (including critical care time)  Medications Ordered in ED Medications - No data to display   Initial Impression / Assessment and Plan / ED Course  I have reviewed the triage vital signs and the nursing notes.  Pertinent  labs & imaging results that were available during my care of the patient were reviewed by me and considered in my medical decision making (see chart for details).  Clinical Course     Patient in with spouse concerned for right LE edema and pain. He is found to have bilaterally swollen legs, chronic appearing. Right leg has no wound, redness, warmth or other sign of recurrent infection. The old wound side is completely healed.  He will return for LE doppler to r/o clot. He will restart his Zaroxolyn as formerly prescribed and follow up with PCP as soon as possible for recheck. Return precautions discussed.   Final Clinical Impressions(s) / ED Diagnoses   Final diagnoses:  None   1. Right LE pain 2. Chronic LE edema  New Prescriptions New Prescriptions   No medications on file     Charlann Lange, PA-C 06/08/16 0029    Gareth Morgan, MD 06/10/16 [phone]

## 2016-06-04 NOTE — ED Triage Notes (Signed)
Per Pt, Pt is coming from home with swelling of the right leg. Pt reports it started today. Pt has HX of wound on the right leg and was seen at the wound center to have the right leg exam.

## 2016-06-05 ENCOUNTER — Ambulatory Visit (HOSPITAL_COMMUNITY): Admission: RE | Admit: 2016-06-05 | Payer: Medicare Other | Source: Ambulatory Visit

## 2016-06-05 DIAGNOSIS — R600 Localized edema: Secondary | ICD-10-CM | POA: Diagnosis not present

## 2016-06-09 ENCOUNTER — Other Ambulatory Visit (HOSPITAL_COMMUNITY): Payer: Self-pay | Admitting: Emergency Medicine

## 2016-06-09 ENCOUNTER — Ambulatory Visit (HOSPITAL_COMMUNITY)
Admission: RE | Admit: 2016-06-09 | Discharge: 2016-06-09 | Disposition: A | Discharge status: Home / Self Care | Payer: Medicare Other | Source: Ambulatory Visit | Attending: Emergency Medicine | Admitting: Emergency Medicine

## 2016-06-09 DIAGNOSIS — R52 Pain, unspecified: Secondary | ICD-10-CM

## 2016-06-09 DIAGNOSIS — M7989 Other specified soft tissue disorders: Secondary | ICD-10-CM | POA: Diagnosis not present

## 2016-06-09 DIAGNOSIS — M79604 Pain in right leg: Secondary | ICD-10-CM | POA: Insufficient documentation

## 2016-06-09 NOTE — Progress Notes (Signed)
VASCULAR LAB PRELIMINARY  PRELIMINARY  PRELIMINARY  PRELIMINARY  Right lower extremity venous duplex completed.    Preliminary report:  There is no obvious evidence of DVT or SVT noted in the visualized veins of the right lower extremity.  Anthea Udovich, RVT 06/09/2016, 8:48 AM

## 2016-07-09 ENCOUNTER — Ambulatory Visit (INDEPENDENT_AMBULATORY_CARE_PROVIDER_SITE_OTHER): Payer: Medicare Other | Admitting: Family Medicine

## 2016-07-09 VITALS — BP 132/84 | HR 78 | Temp 98.0°F | Resp 17 | Ht 69.5 in | Wt 345.0 lb

## 2016-07-09 DIAGNOSIS — R531 Weakness: Secondary | ICD-10-CM | POA: Diagnosis not present

## 2016-07-09 DIAGNOSIS — R0609 Other forms of dyspnea: Secondary | ICD-10-CM

## 2016-07-09 DIAGNOSIS — R5381 Other malaise: Secondary | ICD-10-CM | POA: Diagnosis not present

## 2016-07-09 DIAGNOSIS — IMO0002 Reserved for concepts with insufficient information to code with codable children: Secondary | ICD-10-CM

## 2016-07-09 DIAGNOSIS — N184 Chronic kidney disease, stage 4 (severe): Secondary | ICD-10-CM

## 2016-07-09 DIAGNOSIS — E1122 Type 2 diabetes mellitus with diabetic chronic kidney disease: Secondary | ICD-10-CM

## 2016-07-09 DIAGNOSIS — I69398 Other sequelae of cerebral infarction: Secondary | ICD-10-CM | POA: Diagnosis not present

## 2016-07-09 DIAGNOSIS — R4701 Aphasia: Secondary | ICD-10-CM

## 2016-07-09 DIAGNOSIS — E1165 Type 2 diabetes mellitus with hyperglycemia: Secondary | ICD-10-CM | POA: Diagnosis not present

## 2016-07-09 LAB — POCT URINALYSIS DIP (MANUAL ENTRY)
Bilirubin, UA: NEGATIVE
Glucose, UA: 100 — AB
Ketones, POC UA: NEGATIVE
LEUKOCYTES UA: NEGATIVE
NITRITE UA: NEGATIVE
PH UA: 5.5
RBC UA: NEGATIVE
Spec Grav, UA: >=1.03
Urobilinogen, UA: 0.2

## 2016-07-09 LAB — GLUCOSE, POCT (MANUAL RESULT ENTRY): POC Glucose: 201 mg/dl — AB (ref 70–99)

## 2016-07-09 LAB — POC MICROSCOPIC URINALYSIS (UMFC): Mucus: ABSENT

## 2016-07-09 LAB — POCT GLYCOSYLATED HEMOGLOBIN (HGB A1C): HEMOGLOBIN A1C: 6.9

## 2016-07-09 MED ORDER — ESCITALOPRAM OXALATE 10 MG PO TABS
ORAL_TABLET | ORAL | 1 refills | Status: DC
Start: 2016-07-09 — End: 2016-11-05

## 2016-07-09 MED ORDER — HYDRALAZINE HCL 25 MG PO TABS: 75.0000 mg | ORAL_TABLET | Freq: Three times a day (TID) | ORAL | 2 refills | Status: DC

## 2016-07-09 MED ORDER — ISOSORBIDE MONONITRATE ER 120 MG PO TB24: 120.0000 mg | ORAL_TABLET | Freq: Every day | ORAL | 2 refills | Status: DC

## 2016-07-09 NOTE — Progress Notes (Deleted)
[id number]

## 2016-07-09 NOTE — Patient Instructions (Addendum)
IF you received an x-ray today, you will receive an invoice from [facility]. Please contact [facility] at [phone] with questions or concerns regarding your invoice.   IF you received labwork today, you will receive an invoice from [facility]. Please contact [facility] at [phone] with questions or concerns regarding your invoice.   Our billing staff will not be able to assist you with questions regarding bills from these companies.  You will be contacted with the lab results as soon as they are available. The fastest way to get your results is to activate your My Chart account. Instructions are located on the last page of this paperwork. If you have not heard from [place] regarding the results in 2 weeks, please contact this office.      End-Stage Kidney Disease End-stage kidney disease occurs when the kidneys are so damaged that they cannot do their job. The kidneys are two organs that do many important jobs in the body, which include:  Removing wastes and extra fluids from the blood.  Making hormones that maintain the amount of fluid in your tissues and blood vessels.  Maintaining the right amount of fluids and chemicals in the body. When the kidneys are damaged and cannot do their job, life-threatening problems occur. Without the help of the kidneys, toxins build up in the blood. In end-stage kidney disease, the kidneys cannot get better. What are the causes? End-stage kidney disease usually occurs when a long-lasting (chronic) kidney disease gets worse. It may also occur after the kidneys are suddenly damaged (acute kidney injury). What increases the risk? This condition is more likely to develop in people who are:  Older than age 29.  Male.  Of African-American descent.  Current smokers or former smokers.  Obese. You may also have an increased risk for end-stage kidney disease if you:  Have a family history of chronic kidney disease (CKD).  Have  had kidney disease for many years.  Have other longstanding medical conditions that affect the kidneys, such as:  Cardiovascular disease including high blood pressure.  Diabetes.  Certain diseases that affect the immune system. What are the signs or symptoms?  Swelling (edema) of the face, legs, ankles, or feet.  Numbness, tingling, or loss of feeling (sensation) in your hands or feet.  Tiredness (lethargy).  Nausea or vomiting.  Confusion, trouble concentrating, or loss of consciousness.  Chest pain.  Shortness of breath.  Little to no urine production.  Muscle twitches and cramps, especially in the legs.  Constant itchiness.  Loss of appetite.  Pale skin and tissue lining your eyelids (conjunctiva).  Headaches.  Abnormally dark or light skin.  Decrease in muscle size (muscle wasting).  Easy bruising.  Frequent hiccups.  Stopping of menstruation in women.  Seizures. How is this diagnosed? Your health care provider will measure your blood pressure and do some tests. These may include:  Urine tests.  Blood tests.  Imaging tests.  A test in which a sample of tissue is removed from the kidneys to be looked at under a microscope (kidney biopsy). How is this treated? There are two treatments for end-stage kidney disease:  A procedure that removes toxic wastes from the body (dialysis). Depending on the type of dialysis you choose, it may be performed more than one time a day (peritoneal dialysis) or several times a week (hemodialysis).  Surgery toreceive a new kidney (kidney transplant). In addition to having dialysis or a kidney transplant, you may need to take medicines:  To control high blood pressure (hypertension).  To control cholesterol.  To maintain healthy electrolyte levels in your blood. You may also be given a specific diet to follow that includes requirements or limits for:  Salt  (sodium).  Protein.  Phosphorous.  Potassium.  Calcium. Follow these instructions at home:  Follow your prescribed diet.  Take over-the-counter and prescription medicines only as told by your health care provider.  Do not take any new medicines unless approved by your health care provider. Many medicines can worsen your kidney damage.  Do not take any vitamin and mineral supplements unless approved by your health care provider. Many nutritional supplements can worsen your kidney damage.  The dose of some medicines that you take may need to be adjusted.  Do not use any tobacco products, such as cigarettes, chewing tobacco, and e-cigarettes. If you need help quitting, ask your health care provider.  Keep all follow-up visits as told by your health care provider. This is important.  Keep track of your blood pressure. Report changes in your blood pressure as told by your health care provider.  Achieve and maintain a healthy weight. If you need help with this, ask your health care provider.  Start or continue an exercise plan. Try to exercise at least 30 minutes a day, 5 days a week.  Stay current with immunizations as told by your health care provider. Where to find more information:  American Association of Kidney Patients: BombTimer.gl  National Kidney Foundation: www.kidney.[place]: https://mathis.com/  Life [facility]: www.lifeoptions.org and www.kidneyschool.org Contact a health care provider if:  Your symptoms get worse.  You develop new symptoms. Get help right away if:  You have weakness in an arm or leg on one side of your body.  You have difficulty speaking or you are slurring your speech.  You have a sudden change in your vision.  You have a sudden, severe headache.  You have a sudden weight increase.  You have difficulty breathing.  Your symptoms suddenly get worse. This information is not intended to replace advice  given to you by your health care provider. Make sure you discuss any questions you have with your health care provider. Document Released: 08/24/2003 Document Revised: 11/09/2015 Document Reviewed: 01/31/2012 Elsevier Interactive Patient Education  2017 [employer].

## 2016-07-09 NOTE — Progress Notes (Addendum)
Subjective:  This chart was scribed for Blake Cheadle MD, by Blake Bell, at [facility]. This patient was seen in room 3 and the patient's care was started at 10:46 AM.   Chief Complaint  Patient presents with  . Follow-up    med check      Patient ID: Blake Nicol Sr., male    DOB: [birth year], 65 y.o.   MRN: [id number]  HPI HPI Comments: Blake Bowen Sr. is a 66 y.o. male who presents to the [facility] for a follow up regarding his medications. Patient is here with his wife and son today. Per patients family, his speech has been gradually worsening and they would like him to see a speech therapist.  He last saw Dr. Florene Bell two weeks ago. He has not yet started dialysis.  He has been feeling more fatigued/sleepy recently. His last blood test was January 9th.   He has been compliant with his Klor Con, Lexapro, and Bidil 20-37.5 TID (120 mg of isosorbide, 75 mg of hydralazine TID)- but is wondering if there is a cheaper way to get this medication.Patient is taking Metolazone at night time.   Went to the Bell December 20th due to leg Bell. Patient states that his leg is now doing better.  He was taking tylenol for this.   Patient does not check his blood sugar levels at home.  He last went to wound care on the 28th of October.  The bandage on his leg was last changed about 2 weeks ago.   January 9th results Potassium- 3.3  Creatinine- 5.97 Anemia with hemoglobin of 10 PTH 268.  He has a usable AV fistula, hypertension was stable, likely recent new CVA giving worsening of aphasia though could be medication. Serum levels increase as renal function decreases.  EGFR was 10.     Past Medical History:  Diagnosis Date  . Chronic kidney disease (CKD), stage III (moderate)    sees dr Blake Bell nephrology every 6-9 months  . CVA (cerebral vascular accident) Blake Bell) 12/2013   admitted in July 2015 for acute right-sided infarct/notes  12/02/2014  . Hypercholesterolemia   . Hypertension   . Multiple wounds    on lower legs - sees [name] Bell at the [facility]  . OSA on CPAP    uses cpap, setting of 5  . 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 Stroke ([place]) 12/02/2014   expressive aphasia  . Systolic CHF ([place])   . Type II diabetes mellitus ([place])    uncontrolled/notes 12/02/2014    Current Outpatient Prescriptions on File Prior to Visit  Medication Sig Dispense Refill  . amLODipine (NORVASC) 10 MG tablet Take 1 tablet (10 mg total) by mouth daily. (Patient taking differently: Take 10 mg by mouth every evening. ) 30 tablet 0  . aspirin 325 MG tablet Take 325 mg by mouth daily at 12 noon.     Marland Kitchen atorvastatin (LIPITOR) 80 MG tablet TAKE 1 TABLET AT 6PM. 90 tablet 3  . BIDIL 20-37.5 MG tablet TAKE (2) TABLETS THREE TIMES DAILY. 540 tablet 1  . calcitRIOL (ROCALTROL) 0.5 MCG capsule Take 0.5 mcg by mouth daily.  6  . clopidogrel (PLAVIX) 75 MG tablet TAKE 1 TABLET BY MOUTH DAILY WITH BREAKFAST 90 tablet 1  . escitalopram (LEXAPRO) 10 MG tablet Take 1 tablet by mouth daily in evening  1  . labetalol (NORMODYNE) 300 MG tablet Take 300 mg by  mouth 3 (three) times daily.   11  . metolazone (ZAROXOLYN) 5 MG tablet Take 1 tablet (5 mg total) by mouth daily. 30 tablet 0  . potassium chloride SA (K-DUR,KLOR-CON) 20 MEQ tablet TAKE 2 TABLETS(40 MEQ) BY MOUTH DAILY 60 tablet 0   No current facility-administered medications on file prior to visit.     No Known Allergies   Review of Systems  Constitutional: Negative for chills and fever.  HENT: Negative for sneezing and sore throat.   Eyes: Negative for redness and itching.  Respiratory: Negative for cough and choking.   Gastrointestinal: Negative for nausea and vomiting.  Neurological: Positive for speech difficulty.       Objective:   Physical Exam  Constitutional: No distress.  Cardiovascular: Normal rate and regular rhythm.   No murmur  heard. 2+ pedal edema on the left.  3+ pitting edema on the right with hyperpigmentation of the skin.   Pulmonary/Chest: Effort normal and breath sounds normal. No respiratory distress.  Skin:  Compression hose in place with dry skin.  Right lower extremity healed wound   Vitals:   07/09/16 1002  BP: 132/84  Pulse: 78  Resp: 17  Temp: 98 F (36.7 C)  TempSrc: Oral  SpO2: 97%  Weight: (!) 345 lb (156.5 kg)  Height: 5' 9.5" (1.765 m)       Assessment & Plan:   needs home nursing to help with med refills and monitor CBGs as well as occasional montoring of blood pressure and patient status as its possible that he is sufferring additional strokes and at some point will acutely decline when he needs to start on dialysis.  Needs home speech therapy and physical therapy as well as weakness and aphasia from prior CVAs ae worsening.  Can't afford bidil so will split it into the 2 separate meds which should be much less expensive.  1. Uncontrolled type 2 diabetes mellitus with stage 4 chronic kidney disease, without long-term current use of insulin ([place])   2. Expressive aphasia   3. Debility   4. Dyspnea on exertion   5. Weakness following cerebrovascular accident (CVA)     Orders Placed This Encounter  Procedures  . Comprehensive metabolic panel  . Ambulatory referral to Home Health    Referral Priority:   Routine    Referral Type:   Home Health Care    Referral Reason:   Specialty Services Required    Requested Specialty:   [place]    Number of Visits Requested:   1  . Ambulatory referral to Podiatry    Referral Priority:   Routine    Referral Type:   Consultation    Referral Reason:   Specialty Services Required    Requested Specialty:   Podiatry    Number of Visits Requested:   1  . Care order/instruction:    Scheduling Instructions:     Complete orders, AVS and go.  Marland Kitchen POCT urinalysis dipstick  . POCT Microscopic Urinalysis (UMFC)  . POCT glucose (manual  entry)  . POCT glycosylated hemoglobin (Hb A1C)    Meds ordered this encounter  Medications  . escitalopram (LEXAPRO) 10 MG tablet    Sig: Take 1 tablet by mouth daily in evening    Dispense:  90 tablet    Refill:  1  . isosorbide mononitrate (IMDUR) 120 MG 24 hr tablet    Sig: Take 1 tablet (120 mg total) by mouth daily.    Dispense:  30 tablet  Refill:  2  . hydrALAZINE (APRESOLINE) 25 MG tablet    Sig: Take 3 tablets (75 mg total) by mouth 3 (three) times daily.    Dispense:  180 tablet    Refill:  2   Over 40 min spent in face-to-face evaluation of and consultation with patient and coordination of care.  Over 50% of this time was spent counseling this patient.  I personally performed the services described in this documentation, which was scribed in my presence. The recorded information has been reviewed and considered, and addended by me as needed.   Blake Bell, M.D.  [facility] [address] [place], [place] [address] [phone] phone [phone] fax  07/17/16 12:15 AM

## 2016-07-10 LAB — COMPREHENSIVE METABOLIC PANEL
A/G RATIO: 0.8 — AB (ref 1.2–2.2)
ALBUMIN: 3.2 g/dL — AB (ref 3.6–4.8)
ALT: 11 IU/L (ref 0–44)
AST: 12 IU/L (ref 0–40)
Alkaline Phosphatase: 53 IU/L (ref 39–117)
BILIRUBIN TOTAL: 0.2 mg/dL (ref 0.0–1.2)
BUN / CREAT RATIO: 12 (ref 10–24)
BUN: 60 mg/dL — ABNORMAL HIGH (ref 8–27)
CHLORIDE: 100 mmol/L (ref 96–106)
CO2: 24 mmol/L (ref 18–29)
Calcium: 9.3 mg/dL (ref 8.6–10.2)
Creatinine, Ser: 5.17 mg/dL (ref 0.76–1.27)
GFR calc Af Amer: 12 mL/min/{1.73_m2} — ABNORMAL LOW (ref >59)
GFR, EST NON AFRICAN AMERICAN: 11 mL/min/{1.73_m2} — AB (ref >59)
Globulin, Total: 3.8 g/dL (ref 1.5–4.5)
Glucose: 175 mg/dL — ABNORMAL HIGH (ref 65–99)
POTASSIUM: 4 mmol/L (ref 3.5–5.2)
Sodium: 141 mmol/L (ref 134–144)
Total Protein: 7 g/dL (ref 6.0–8.5)

## 2016-07-15 ENCOUNTER — Telehealth: Payer: Self-pay

## 2016-07-15 NOTE — Telephone Encounter (Signed)
Very odd. At our last visit, they brought up home health and asked for it specifically. I wonder if wife just didn't recognize the organization's name - I have noticed that she has a some difficulty remembering things and navigating the healthcare system.  Pt has severe dysarthria and aphasia so cannot physically speak on the phone - very difficult to understand in person when he can answer.  Pt's son - Alic Obremski - lives in town and is organizing pt's health care. He was at the last visit and asked to be called about the home health services as well and both pt and wife agreed that he be contacted for this.  Hopefully his # is in the chart so you can let him know about this and he can contact [facility] - if not I have it written down on my laptop so can get it for you tomorrow.

## 2016-07-15 NOTE — Telephone Encounter (Signed)
[facility] Health [facility] called to schedule with patient. Pt's wife answered the phone and would not let the scheduler speak with Mr Blake Bell. After explaining who she was to pts wife, the wife stated that they are not interested in these services.

## 2016-07-15 NOTE — Telephone Encounter (Signed)
Unfortunately I can't seem to find it in the chart anywhere. Please send to me when possible. Thank you!

## 2016-07-16 NOTE — Telephone Encounter (Signed)
Blake Bell [phone] (c)

## 2016-07-23 ENCOUNTER — Telehealth: Payer: Self-pay

## 2016-07-23 NOTE — Telephone Encounter (Signed)
Ok for PT 

## 2016-07-23 NOTE — Telephone Encounter (Signed)
Pramod from pt home health is needing a verbal order for Physical Therapy   Best number [phone]

## 2016-07-24 NOTE — Telephone Encounter (Signed)
Perfect, thank you

## 2016-07-26 ENCOUNTER — Telehealth: Payer: Self-pay

## 2016-07-26 NOTE — Telephone Encounter (Signed)
Home health nurse is calling to let dr Brigitte Pulse know that he evaluation is moved to next week due to scheduling issues

## 2016-07-26 NOTE — Telephone Encounter (Signed)
FYI

## 2016-07-31 ENCOUNTER — Ambulatory Visit (INDEPENDENT_AMBULATORY_CARE_PROVIDER_SITE_OTHER): Payer: Medicare Other | Admitting: Podiatry

## 2016-07-31 ENCOUNTER — Telehealth: Payer: Self-pay

## 2016-07-31 ENCOUNTER — Encounter: Payer: Self-pay | Admitting: Podiatry

## 2016-07-31 VITALS — BP 183/113 | HR 72 | Resp 18

## 2016-07-31 DIAGNOSIS — R0989 Other specified symptoms and signs involving the circulatory and respiratory systems: Secondary | ICD-10-CM | POA: Diagnosis not present

## 2016-07-31 DIAGNOSIS — L84 Corns and callosities: Secondary | ICD-10-CM

## 2016-07-31 DIAGNOSIS — B351 Tinea unguium: Secondary | ICD-10-CM

## 2016-07-31 DIAGNOSIS — E1151 Type 2 diabetes mellitus with diabetic peripheral angiopathy without gangrene: Secondary | ICD-10-CM | POA: Diagnosis not present

## 2016-07-31 DIAGNOSIS — Q828 Other specified congenital malformations of skin: Secondary | ICD-10-CM | POA: Diagnosis not present

## 2016-07-31 NOTE — Telephone Encounter (Signed)
THIS MESSAGE IS FROM MINDA SHEPPARD (SPEECH THERAPIST) FROM [facility]: SHE NEEDS A VERBAL ORDER FOR SPEECH THERAPY DUE TO APHASIA. SHE REQUEST IT BE DONE 2 TIMES A WEEK FOR 6 WEEKS. BEST PHONE [phone] ([place]) [place]

## 2016-07-31 NOTE — Progress Notes (Signed)
   Subjective:    Patient ID: Blake Ina Sr., male    DOB: Jan 22, 1951, 66 y.o.   MRN: [id number]  HPI   I  This patient presents today with his wife present to treatment room. Patient states that he wants to get his toenails trim because they are thick and long and tender when walking wearing shoes. Describes the nails gradually thickening over time and patient is no longer able to self treat his toenails. He also mentions generalized throbbing and burning and numbness and tingling in his feet on off weightbearing for multiple months without any progression of symptoms over time. Patient is diabetic and denies history of foot ulceration and amputation. He does describe some claudication when walking Patient denies history of smoking Patient is history of CVA causing disability  Review of Systems  Constitutional: Positive for unexpected weight change.  Cardiovascular: Positive for leg swelling.       Objective:   Physical Exam   Patient approximate 5 foot 9 an approximate 345 pounds  Patient appears orientated 3, however, is slow to respond to direct questioning  vascular:  DP pulses 2/4 bilaterally PT pulses 0/4 bilaterally Capillary reflex within normal limits bilaterally  Neurological: Sensation to 10 g monofilament wire intact 4/5 bilaterally Vibratory sensation reactive bilaterally Ankle reflexes nonreactive bilaterally  Dermatological: No open skin lesions bilaterally Dry skin bilaterally Callus distal left hallux Toenails elongated brittle, deformed and tender to direct palpation 6-10  Musculoskeletal: Pes planus bilaterally Hammertoe second bilaterally Manual motor testing dorsi flexion and plantar flexion 5/5 bilaterally Patient walks slowly with assistance of walker      Assessment & Plan:   Assessment: Diabetic with absent pedal pulses, posterior tibial Diabetic peripheral vascular disease Symptomatic mycotic toenails 6-10  Plan: Patient  referred to vascular lab for lower extremity arterial Doppler for the indication of diabetic with absent posterior tibial pulses. Notify patient upon receipt of vascular lab The toenails 6-10 are debrided mechanically atelectatic without any bleeding  Reappoint 3 months

## 2016-07-31 NOTE — Patient Instructions (Signed)
Today your diabetic foot screen demonstrated adequate feeling in your feet I'm referring you to a vascular lab to evaluate his circulation in your legs and feet . Will notify you of results  Please return every 3 months for debridement of thick toenails and calluses  Diabetes and Foot Care Diabetes may cause you to have problems because of poor blood supply (circulation) to your feet and legs. This may cause the skin on your feet to become thinner, break easier, and heal more slowly. Your skin may become dry, and the skin may peel and crack. You may also have nerve damage in your legs and feet causing decreased feeling in them. You may not notice minor injuries to your feet that could lead to infections or more serious problems. Taking care of your feet is one of the most important things you can do for yourself. Follow these instructions at home:  Wear shoes at all times, even in the house. Do not go barefoot. Bare feet are easily injured.  Check your feet daily for blisters, cuts, and redness. If you cannot see the bottom of your feet, use a mirror or ask someone for help.  Wash your feet with warm water (do not use hot water) and mild soap. Then pat your feet and the areas between your toes until they are completely dry. Do not soak your feet as this can dry your skin.  Apply a moisturizing lotion or petroleum jelly (that does not contain alcohol and is unscented) to the skin on your feet and to dry, brittle toenails. Do not apply lotion between your toes.  Trim your toenails straight across. Do not dig under them or around the cuticle. File the edges of your nails with an emery board or nail file.  Do not cut corns or calluses or try to remove them with medicine.  Wear clean socks or stockings every day. Make sure they are not too tight. Do not wear knee-high stockings since they may decrease blood flow to your legs.  Wear shoes that fit properly and have enough cushioning. To break in new  shoes, wear them for just a few hours a day. This prevents you from injuring your feet. Always look in your shoes before you put them on to be sure there are no objects inside.  Do not cross your legs. This may decrease the blood flow to your feet.  If you find a minor scrape, cut, or break in the skin on your feet, keep it and the skin around it clean and dry. These areas may be cleansed with mild soap and water. Do not cleanse the area with peroxide, alcohol, or iodine.  When you remove an adhesive bandage, be sure not to damage the skin around it.  If you have a wound, look at it several times a day to make sure it is healing.  Do not use heating pads or hot water bottles. They may burn your skin. If you have lost feeling in your feet or legs, you may not know it is happening until it is too late.  Make sure your health care provider performs a complete foot exam at least annually or more often if you have foot problems. Report any cuts, sores, or bruises to your health care provider immediately. Contact a health care provider if:  You have an injury that is not healing.  You have cuts or breaks in the skin.  You have an ingrown nail.  You notice redness on your  legs or feet.  You feel burning or tingling in your legs or feet.  You have pain or cramps in your legs and feet.  Your legs or feet are numb.  Your feet always feel cold. Get help right away if:  There is increasing redness, swelling, or pain in or around a wound.  There is a red line that goes up your leg.  Pus is coming from a wound.  You develop a fever or as directed by your health care provider.  You notice a bad smell coming from an ulcer or wound. This information is not intended to replace advice given to you by your health care provider. Make sure you discuss any questions you have with your health care provider. Document Released: 05/31/2000 Document Revised: 11/09/2015 Document Reviewed:  11/10/2012 Elsevier Interactive Patient Education  2017 [employer].

## 2016-08-01 NOTE — Telephone Encounter (Signed)
Attempted to call however voicemail.  Please call again number listed.Marland KitchenMarland Kitchen

## 2016-08-01 NOTE — Telephone Encounter (Signed)
Sounds great thanks

## 2016-08-01 NOTE — Telephone Encounter (Signed)
Ok to give verbal ok?

## 2016-08-02 NOTE — Telephone Encounter (Signed)
Spoke with Alla Feeling and ok the therapy.

## 2016-08-09 ENCOUNTER — Telehealth: Payer: Self-pay | Admitting: *Deleted

## 2016-08-09 NOTE — Telephone Encounter (Signed)
Pt's son, Junior states pt was in 07/30/2016 to be fitted for Diabetic Shoes and then was told he needed to return in May. Junior states he has questions.

## 2016-08-13 ENCOUNTER — Telehealth: Payer: Self-pay

## 2016-08-13 NOTE — Telephone Encounter (Signed)
perfect

## 2016-08-13 NOTE — Telephone Encounter (Signed)
Verbal ok given for in home nursing visits 1 q week x 4 weeks from home health

## 2016-08-22 NOTE — Telephone Encounter (Signed)
THERE IS PAPERWORK WE NEED TO FAX IN NURSE BOX FOR [facility] THAT WE CANNOT GET TO GO THROUGH FAX, WE HAVE LEFT 2 MESSAGES FOR [place] TO CALL us BACK WITH FAX #

## 2016-08-23 ENCOUNTER — Other Ambulatory Visit: Payer: Self-pay | Admitting: Family Medicine

## 2016-08-23 MED ORDER — TORSEMIDE 20 MG PO TABS: 40.0000 mg | ORAL_TABLET | Freq: Every day | ORAL | Status: DC

## 2016-08-27 NOTE — Telephone Encounter (Signed)
This office picked up the paperwork from our office today

## 2016-08-29 ENCOUNTER — Ambulatory Visit (HOSPITAL_COMMUNITY)
Admission: RE | Admit: 2016-08-29 | Discharge: 2016-08-29 | Disposition: A | Discharge status: Home / Self Care | Payer: Medicare Other | Source: Ambulatory Visit | Attending: Vascular Surgery | Admitting: Vascular Surgery

## 2016-08-29 DIAGNOSIS — E1151 Type 2 diabetes mellitus with diabetic peripheral angiopathy without gangrene: Secondary | ICD-10-CM | POA: Insufficient documentation

## 2016-08-29 DIAGNOSIS — R600 Localized edema: Secondary | ICD-10-CM | POA: Diagnosis not present

## 2016-08-29 DIAGNOSIS — R0989 Other specified symptoms and signs involving the circulatory and respiratory systems: Secondary | ICD-10-CM | POA: Insufficient documentation

## 2016-08-29 LAB — VAS US ABI WITH/WO TBI
LATIBDISTSYS: 90 cm/s
LPTIBDISTSYS: 214 cm/s
Left super femoral prox sys PSV: 140 cm/s
RATIBDISTSYS: 38 cm/s
RTIBDISTSYS: 96 cm/s
Right super femoral prox sys PSV: 130 cm/s

## 2016-08-30 ENCOUNTER — Telehealth: Payer: Self-pay | Admitting: *Deleted

## 2016-08-30 NOTE — Telephone Encounter (Signed)
Called patient's son and the son was wanting to know why his dad did not get measured for shoes that day and I explained the way that we do the process and I stated that I would find out what the status was and call son back today. Lattie Haw

## 2016-09-04 NOTE — Telephone Encounter (Signed)
Called patient and patient stated that some one had called her last week about the diabetic shoes and was to get back with her this week. Lattie Haw

## 2016-09-05 ENCOUNTER — Encounter: Payer: Self-pay | Admitting: Family Medicine

## 2016-09-05 ENCOUNTER — Ambulatory Visit (INDEPENDENT_AMBULATORY_CARE_PROVIDER_SITE_OTHER): Payer: Medicare Other | Admitting: Family Medicine

## 2016-09-05 VITALS — BP 147/75 | HR 81 | Temp 98.0°F | Resp 16 | Ht 69.0 in | Wt 342.0 lb

## 2016-09-05 DIAGNOSIS — I5022 Chronic systolic (congestive) heart failure: Secondary | ICD-10-CM

## 2016-09-05 DIAGNOSIS — R5381 Other malaise: Secondary | ICD-10-CM | POA: Diagnosis not present

## 2016-09-05 DIAGNOSIS — E1149 Type 2 diabetes mellitus with other diabetic neurological complication: Secondary | ICD-10-CM

## 2016-09-05 DIAGNOSIS — R0609 Other forms of dyspnea: Secondary | ICD-10-CM | POA: Diagnosis not present

## 2016-09-05 DIAGNOSIS — E1122 Type 2 diabetes mellitus with diabetic chronic kidney disease: Secondary | ICD-10-CM | POA: Diagnosis not present

## 2016-09-05 DIAGNOSIS — I69398 Other sequelae of cerebral infarction: Secondary | ICD-10-CM

## 2016-09-05 DIAGNOSIS — R531 Weakness: Secondary | ICD-10-CM

## 2016-09-05 DIAGNOSIS — R4701 Aphasia: Secondary | ICD-10-CM

## 2016-09-05 DIAGNOSIS — IMO0002 Reserved for concepts with insufficient information to code with codable children: Secondary | ICD-10-CM

## 2016-09-05 DIAGNOSIS — N184 Chronic kidney disease, stage 4 (severe): Secondary | ICD-10-CM

## 2016-09-05 DIAGNOSIS — I10 Essential (primary) hypertension: Secondary | ICD-10-CM

## 2016-09-05 DIAGNOSIS — Z789 Other specified health status: Secondary | ICD-10-CM

## 2016-09-05 DIAGNOSIS — G4733 Obstructive sleep apnea (adult) (pediatric): Secondary | ICD-10-CM

## 2016-09-05 DIAGNOSIS — I5032 Chronic diastolic (congestive) heart failure: Secondary | ICD-10-CM

## 2016-09-05 DIAGNOSIS — Z9989 Dependence on other enabling machines and devices: Secondary | ICD-10-CM

## 2016-09-05 DIAGNOSIS — E1165 Type 2 diabetes mellitus with hyperglycemia: Secondary | ICD-10-CM

## 2016-09-05 NOTE — Progress Notes (Signed)
Subjective:    Patient ID: Blake Ina Sr., male    DOB: 11/19/1950, 66 y.o.   MRN: [id number]   Chief Complaint  Patient presents with  . PT Treatment    pt would like to discuss treatment  . Follow-up    pt would like to give update on whats going on at his cardiologist  . Medication Management    HPI Blake Bell heard fluid in lungs and sent back to [facility] who restarted the fluid pills so he is urinating a lot more. PT and OT are ok in those 2 days but he is by himself 3 other days as wife works full time. Son lives in town but he also works full time and has his own family.  Is missing medications He sleeps a lot and so if someone wakes him up.  Wife is working so son tries to encourage wife to give him his pills in the morning.  He stays by himself most work days - 5d  PT/OT come from 12-2 on Tues and Thurs. Had a nurse who came once or twice only and they never saw her again. Son was trying to be there for the visits as he was worried that Blake Bell would be asleep and didn't hear the doorbells.   He feels cold all the time and found out anemic with low iron so he is going in for IV iron next week.   Has [place] and lab work scheduled today as well.  He wonders about going for for 30d of rehab - he has a hard time getting to the car and so was wondering if he had an intensive experience in  inpt rehab. Mother is going to help with PGF next wk so he is going to be living by himself. He won't answer the phone if he is asleep for a month Blake Bell - the SW was going to look into for him.   Past Medical History:  Diagnosis Date  . Chronic kidney disease (CKD), stage III (moderate)    sees dr Blake Bell nephrology every 6-9 months  . CVA (cerebral vascular accident) [facility]) 12/2013   admitted in July 2015 for acute right-sided infarct/notes 12/02/2014  . Hypercholesterolemia   . Hypertension   . Multiple wounds    on lower legs - sees [name] Bell at the [facility]  .  OSA on CPAP    uses cpap, setting of 5  . Prolactin secreting pituitary adenoma ([facility]ake Bell)    Blake Bell 12/02/2014  . Stroke (Blake Bell) 12/02/2014   expressive aphasia  . Systolic CHF (Blake Bell)   . Type II diabetes mellitus (Blake Bell)    uncontrolled/notes 12/02/2014   Past Surgical History:  Procedure Laterality Date  . AV FISTULA PLACEMENT Left 09/27/2015   Procedure: RADIOCEPHALIC ARTERIOVENOUS (AV) FISTULA CREATION LEFT ARM;  Surgeon: Blake Misty, MD;  Location: [facility];  Service: Vascular;  Laterality: Left;  . COLONOSCOPY  6-7 yrs ago  . COLONOSCOPY N/A 04/05/2013   Procedure: COLONOSCOPY;  Surgeon: Blake Craver, MD;  [facility];  Service: Endoscopy;  Laterality: N/A;   Current Outpatient Prescriptions on File Prior to Visit  Medication Sig Dispense Refill  . amLODipine (NORVASC) 10 MG tablet Take 1 tablet (10 mg total) by mouth daily. (Patient taking differently: Take 10 mg by mouth every evening. ) 30 tablet 0  . aspirin 325 MG tablet Take 325 mg by mouth daily at 12 noon.     Marland Kitchen atorvastatin (LIPITOR) 80 MG tablet  TAKE 1 TABLET AT 6PM. 90 tablet 3  . calcitRIOL (ROCALTROL) 0.5 MCG capsule Take 0.5 mcg by mouth daily.  6  . clopidogrel (PLAVIX) 75 MG tablet TAKE 1 TABLET BY MOUTH DAILY WITH BREAKFAST 90 tablet 1  . escitalopram (LEXAPRO) 10 MG tablet Take 1 tablet by mouth daily in evening 90 tablet 1  . hydrALAZINE (APRESOLINE) 25 MG tablet Take 3 tablets (75 mg total) by mouth 3 (three) times daily. 180 tablet 2  . isosorbide mononitrate (IMDUR) 120 MG 24 hr tablet Take 1 tablet (120 mg total) by mouth daily. 30 tablet 2  . labetalol (NORMODYNE) 300 MG tablet Take 300 mg by mouth 3 (three) times daily.   11  . metolazone (ZAROXOLYN) 5 MG tablet Take 1 tablet (5 mg total) by mouth daily. 30 tablet 0  . potassium chloride SA (K-DUR,KLOR-CON) 20 MEQ tablet TAKE 2 TABLETS(40 MEQ) BY MOUTH DAILY 60 tablet 0  . torsemide (DEMADEX) 20 MG tablet Take 2 tablets (40 mg total) by mouth daily.     No  current facility-administered medications on file prior to visit.    No Known Allergies Family History  Problem Relation Age of Onset  . Diabetes Mother   . Hypertension Mother   . Diabetes Father   . Hypertension Father   . Asthma Son   . Diabetes Sister   . Diabetes Brother    Social History   Social History  . Marital status: Married    Spouse name: N/A  . Number of children: 3  . Years of education: 12th    Occupational History  . part-time/full self emp.    Social History Main Topics  . Smoking status: Former Smoker    Packs/day: 0.25    Years: 2.00    Types: Cigarettes    Quit date: 06/17/1970  . Smokeless tobacco: Never Used  . Alcohol use No  . Drug use: Yes    Types: Marijuana     Comment: H/O marijuana use x 1 many years ago  . Sexual activity: Yes   Other Topics Concern  . None   Social History Narrative   Patient lives at home with his wife.   Patient right handed   Patient drinks soda's and coffee   Depression screen [facility] 2/9 09/05/2016 03/12/2016 02/17/2016 07/11/2015 07/07/2015  Decreased Interest 0 0 0 0 0  Down, Depressed, Hopeless 0 0 0 0 0  PHQ - 2 Score 0 0 0 0 0    Review of Systems  Constitutional: Positive for activity change (small increase with PT only), appetite change (increase), fatigue and unexpected weight change (gain). Negative for chills and fever.  Respiratory: Positive for shortness of breath. Negative for wheezing.   Cardiovascular: Positive for leg swelling. Negative for chest Bell.  Endocrine: Positive for polyphagia and polyuria. Negative for polydipsia.  Genitourinary: Positive for frequency. Negative for decreased urine volume and difficulty urinating.  Musculoskeletal: Positive for arthralgias, back Bell, gait problem, joint swelling and myalgias.  Allergic/Immunologic: Negative for immunocompromised state.  Neurological: Positive for speech difficulty, weakness and numbness. Negative for syncope.  Psychiatric/Behavioral:  Negative for self-injury.       Objective:   Physical Exam  Constitutional: He appears well-developed and well-nourished. No distress.  Morbid obesity  HENT:  Head: Normocephalic and atraumatic.  Eyes: No scleral icterus.  Pulmonary/Chest: Effort normal.  Dyspneic with minimal exertion -even walking 15 yrds from waiting to exam rm  Neurological: He is alert. He displays atrophy. A sensory  deficit is present. He exhibits abnormal muscle tone. Coordination and gait abnormal.  Slow dragging gait, heavily dependent upon walker, needs assistance with getting onto and off of exam bed Hip flexor strength 3/5 Rt, 4/5 Lt Difficult to answer questions due to expressive aphasia - difficulty with word finding and difficulty speaking/pronouncing  Skin: Skin is warm and dry. He is not diaphoretic.  Psychiatric: He has a normal mood and affect. His speech is delayed and slurred. He is slowed. Cognition and memory are impaired. He exhibits abnormal recent memory.      BP (!) 147/75   Pulse 81   Temp 98 F (36.7 C) (Oral)   Resp 16   Ht 5\' 9"  (1.753 m)   Wt (!) 342 lb (155.1 kg)   SpO2 95%   BMI 50.50 kg/m   Assessment & Plan:   1. Chronic systolic congestive heart failure ([place])   2. Essential hypertension, benign   3. Chronic diastolic heart failure, NYHA class 2 ([place])   4. OSA (obstructive sleep apnea)   5. Type II diabetes mellitus with neurological manifestations ([place])   6. Uncontrolled type 2 diabetes mellitus with stage 4 chronic kidney disease, without long-term current use of insulin ([place])   7. Chronic kidney disease (CKD), stage IV (severe) ([place])   8. Morbid obesity ([place])   9. Expressive aphasia   10. Debility   11. Dyspnea on exertion   12. Weakness following cerebrovascular accident (CVA)   13. Dependent on walker for ambulation   14. Poor tolerance for ambulation    pt is staying by himself most of the time - wife works, he sleeps most of the day so very poor med and home  health compliance so worsening Even on the days that home PT and OT come out someone still has to come home from work to let them in as the patient will sleep through their knocking and not let them in. Son and patient's wife I've really hoping that he could qualify for a 30-45 day inpatient physical therapy, occupational therapy, and speech therapy rehabilitation facility. They think this would give him the needed intensive interventions and exercise to be able to make significant improvements so that he could stay at home. Without this they are rightly worried his numerous serious chronic medical conditions will continue to worsen resulting in hospitalization with worsening permanent debility requiring permanent nursing home care or even death. We will contact the hospital social work to see if they have any advice. See AVS gave son numerous resources to check out online through the county and state.  Needs to contact supplemental Medicare insurance to see what if any benefit they offer. Consider adult day programs. Placed referral to [facility]. Unfortunately at this point he probably has not utilized enough healthcare resources to qualify for case management but hoping they might at least be able to provide family some guidance on how to find out their options - what pt would qualify for, how to go about contacting/applying, and how costly this all might be. . . .   Over 25 min spent in face-to-face evaluation of and consultation with patient and coordination of care.  Over 50% of this time was spent counseling this patient.  [name], M.D.  Primary Care at [facility] [address] [place], [place] [address] [phone] phone [phone] fax  09/06/16 6:01 PM

## 2016-09-05 NOTE — Patient Instructions (Addendum)
     IF you received an x-ray today, you will receive an invoice from [facility]. Please contact [facility] at [phone] with questions or concerns regarding your invoice.   IF you received labwork today, you will receive an invoice from [facility]. Please contact [facility] at [phone] with questions or concerns regarding your invoice.   Our billing staff will not be able to assist you with questions regarding bills from these companies.  You will be contacted with the lab results as soon as they are available. The fastest way to get your results is to activate your My Chart account. Instructions are located on the last page of this paperwork. If you have not heard from [place] regarding the results in 2 weeks, please contact this office.     I think it is unlikely that your insurance will pay for an inpatient skilled rehabilitation facility. I recommend you call your supplemental insurance and ask. The other resources you may want to consider are contacting the [facility]. Call [phone]. Day will try to help people with Medicare find needed servaices and be aware of the available servaices in the area  I think that realistically an adult daycare program for several weeks is going to be much more feasible. When you're speaking with the supplemental insurance rep make sure to ask them about day programs that are covered.  The last alternative would be getting a home health aide - could come in an hour in the morning and an hour in the afternoon to give meds, make food, help with light housework etc -  which would be from a private group like [facility] senior care or [employer]. Also make sure to ask the supplemental insurance rep about any coverage for this.    Checked out: [email or web address]-[email or web address]   I would also recommend looking at the [place] [employer] and [employer]. They will have listing of approved adult daycare programs and would also likely be able to help you utilize Medicare services fully. Also go online to check out the [facility] agency on aging (AAA)- the [employer] cover [employer] - check out resources on GamingBus.[employer].ee

## 2016-09-06 DIAGNOSIS — R4701 Aphasia: Secondary | ICD-10-CM | POA: Insufficient documentation

## 2016-09-06 DIAGNOSIS — R531 Weakness: Secondary | ICD-10-CM

## 2016-09-06 DIAGNOSIS — I69398 Other sequelae of cerebral infarction: Secondary | ICD-10-CM | POA: Insufficient documentation

## 2016-09-06 DIAGNOSIS — Z789 Other specified health status: Secondary | ICD-10-CM | POA: Insufficient documentation

## 2016-09-07 ENCOUNTER — Other Ambulatory Visit: Payer: Self-pay | Admitting: Family Medicine

## 2016-09-09 ENCOUNTER — Telehealth: Payer: Self-pay | Admitting: Family Medicine

## 2016-09-09 MED ORDER — HYDRALAZINE HCL 25 MG PO TABS: 25.0000 mg | ORAL_TABLET | Freq: Three times a day (TID) | ORAL | 1 refills | Status: DC

## 2016-09-09 MED ORDER — HYDRALAZINE HCL 25 MG PO TABS: 75.0000 mg | ORAL_TABLET | Freq: Three times a day (TID) | ORAL | 2 refills | Status: DC

## 2016-09-09 NOTE — Telephone Encounter (Signed)
On his med list it says the correct dose which is 3 tabs three times a day for a total of 9 pills a day of hydralazine. No - do not discontinue

## 2016-09-09 NOTE — Telephone Encounter (Signed)
PATIENT'S SON (Blake Bell) WOULD LIKE TO ASK DR. SHAW IF HIS FATHER SHOULD BE TAKING HYDRALAZINE 25 MG 3 PILLS 3 TIMES A DAY? HE TAKES A TOTAL OF 9 PILLS. DOES SHE WANT HIM TO DISCONTINUE THIS? BEST PHONE [phone] (HIS SON IS Krystal Eaton) [place]

## 2016-09-09 NOTE — Telephone Encounter (Signed)
Please clarify, this is what I see on med list...refill?

## 2016-09-11 ENCOUNTER — Other Ambulatory Visit (HOSPITAL_COMMUNITY): Payer: Self-pay | Admitting: *Deleted

## 2016-09-12 ENCOUNTER — Ambulatory Visit (HOSPITAL_COMMUNITY)
Admission: RE | Admit: 2016-09-12 | Discharge: 2016-09-12 | Disposition: A | Discharge status: Home / Self Care | Payer: Medicare Other | Source: Ambulatory Visit | Attending: Nephrology | Admitting: Nephrology

## 2016-09-12 DIAGNOSIS — D631 Anemia in chronic kidney disease: Secondary | ICD-10-CM | POA: Insufficient documentation

## 2016-09-12 DIAGNOSIS — N189 Chronic kidney disease, unspecified: Secondary | ICD-10-CM | POA: Insufficient documentation

## 2016-09-12 DIAGNOSIS — I5032 Chronic diastolic (congestive) heart failure: Secondary | ICD-10-CM

## 2016-09-12 MED ORDER — SODIUM CHLORIDE 0.9 % IV SOLN
510.0000 mg | INTRAVENOUS | Status: DC
  Administered 2016-09-12: 510 mg via INTRAVENOUS
  Filled 2016-09-12: qty 17

## 2016-09-12 NOTE — Discharge Instructions (Signed)

## 2016-09-18 ENCOUNTER — Other Ambulatory Visit (HOSPITAL_COMMUNITY): Payer: Self-pay | Admitting: *Deleted

## 2016-09-19 ENCOUNTER — Other Ambulatory Visit: Payer: Self-pay | Admitting: Family Medicine

## 2016-09-19 ENCOUNTER — Other Ambulatory Visit: Payer: Self-pay

## 2016-09-19 ENCOUNTER — Ambulatory Visit (HOSPITAL_COMMUNITY)
Admission: RE | Admit: 2016-09-19 | Discharge: 2016-09-19 | Disposition: A | Discharge status: Home / Self Care | Payer: Medicare Other | Source: Ambulatory Visit | Attending: Nephrology | Admitting: Nephrology

## 2016-09-19 DIAGNOSIS — D631 Anemia in chronic kidney disease: Secondary | ICD-10-CM | POA: Insufficient documentation

## 2016-09-19 DIAGNOSIS — N189 Chronic kidney disease, unspecified: Secondary | ICD-10-CM | POA: Insufficient documentation

## 2016-09-19 MED ORDER — POTASSIUM CHLORIDE CRYS ER 20 MEQ PO TBCR: EXTENDED_RELEASE_TABLET | ORAL | 0 refills | Status: DC

## 2016-09-19 MED ORDER — SODIUM CHLORIDE 0.9 % IV SOLN
510.0000 mg | INTRAVENOUS | Status: AC
  Administered 2016-09-19: 10:00:00 510 mg via INTRAVENOUS
  Filled 2016-09-19: qty 17

## 2016-09-26 ENCOUNTER — Telehealth: Payer: Self-pay | Admitting: Family Medicine

## 2016-09-26 ENCOUNTER — Other Ambulatory Visit: Payer: Self-pay | Admitting: Family Medicine

## 2016-09-26 NOTE — Telephone Encounter (Signed)
I have concerns that he is not taking his medicine but if he is asymptomatic than I recommend that he work on med compliance and call nephrology if still elevated.

## 2016-09-26 NOTE — Telephone Encounter (Signed)
Any concerns ?  

## 2016-09-26 NOTE — Telephone Encounter (Signed)
Harlan Stains from [facility] calling to let us know pt's son canceled the appt for [place] today and said for Home Health to check back in with them for [place]. Home Health said they have him back on the schedule for tomorrow but will call pt beforehand to see if they will keep the appt. Home Health said pt's son said they wanted to think about whether or not they want Skilled Health and they could check back in with him. Cynthia's callback number is [phone].

## 2016-09-26 NOTE — Telephone Encounter (Signed)
Left message to return call 

## 2016-09-26 NOTE — Telephone Encounter (Signed)
[facility] is calling stating that the patient has an elevated blood pressure of 160/99 then 179/101 after coming to answer the door -he is a symptomatic and has not had medication since noon meds yesterday   Best number [phone]

## 2016-09-27 NOTE — Telephone Encounter (Signed)
l/m with dr Manya Silvas note to care team

## 2016-10-02 ENCOUNTER — Other Ambulatory Visit: Payer: Self-pay | Admitting: Family Medicine

## 2016-10-08 ENCOUNTER — Other Ambulatory Visit: Payer: Self-pay | Admitting: Family Medicine

## 2016-10-18 MED ORDER — ATORVASTATIN CALCIUM 80 MG PO TABS: 80.0000 mg | ORAL_TABLET | Freq: Every day | ORAL | 0 refills | Status: DC

## 2016-10-18 MED ORDER — POTASSIUM CHLORIDE CRYS ER 20 MEQ PO TBCR: 20.0000 meq | EXTENDED_RELEASE_TABLET | Freq: Two times a day (BID) | ORAL | 0 refills | Status: DC

## 2016-10-23 ENCOUNTER — Encounter (HOSPITAL_BASED_OUTPATIENT_CLINIC_OR_DEPARTMENT_OTHER): Payer: Medicare Other | Attending: Surgery

## 2016-10-23 DIAGNOSIS — I110 Hypertensive heart disease with heart failure: Secondary | ICD-10-CM | POA: Insufficient documentation

## 2016-10-23 DIAGNOSIS — E11622 Type 2 diabetes mellitus with other skin ulcer: Secondary | ICD-10-CM | POA: Diagnosis not present

## 2016-10-23 DIAGNOSIS — I890 Lymphedema, not elsewhere classified: Secondary | ICD-10-CM | POA: Diagnosis not present

## 2016-10-23 DIAGNOSIS — I5022 Chronic systolic (congestive) heart failure: Secondary | ICD-10-CM | POA: Insufficient documentation

## 2016-10-23 DIAGNOSIS — E1149 Type 2 diabetes mellitus with other diabetic neurological complication: Secondary | ICD-10-CM | POA: Diagnosis not present

## 2016-10-23 DIAGNOSIS — L97212 Non-pressure chronic ulcer of right calf with fat layer exposed: Secondary | ICD-10-CM | POA: Insufficient documentation

## 2016-10-23 DIAGNOSIS — R531 Weakness: Secondary | ICD-10-CM | POA: Insufficient documentation

## 2016-10-23 DIAGNOSIS — I69398 Other sequelae of cerebral infarction: Secondary | ICD-10-CM | POA: Diagnosis not present

## 2016-10-23 DIAGNOSIS — G4730 Sleep apnea, unspecified: Secondary | ICD-10-CM | POA: Diagnosis not present

## 2016-10-23 DIAGNOSIS — E1151 Type 2 diabetes mellitus with diabetic peripheral angiopathy without gangrene: Secondary | ICD-10-CM | POA: Insufficient documentation

## 2016-10-23 DIAGNOSIS — I87311 Chronic venous hypertension (idiopathic) with ulcer of right lower extremity: Secondary | ICD-10-CM | POA: Insufficient documentation

## 2016-10-30 ENCOUNTER — Ambulatory Visit: Payer: Medicare Other | Admitting: Podiatry

## 2016-10-30 DIAGNOSIS — I87311 Chronic venous hypertension (idiopathic) with ulcer of right lower extremity: Secondary | ICD-10-CM | POA: Diagnosis not present

## 2016-11-03 ENCOUNTER — Encounter: Payer: Self-pay | Admitting: Family Medicine

## 2016-11-03 DIAGNOSIS — L97911 Non-pressure chronic ulcer of unspecified part of right lower leg limited to breakdown of skin: Secondary | ICD-10-CM

## 2016-11-05 MED ORDER — HYDRALAZINE HCL 25 MG PO TABS: 75.0000 mg | ORAL_TABLET | Freq: Three times a day (TID) | ORAL | 0 refills | Status: DC

## 2016-11-05 MED ORDER — LABETALOL HCL 300 MG PO TABS: 300.0000 mg | ORAL_TABLET | Freq: Three times a day (TID) | ORAL | 0 refills | Status: DC

## 2016-11-05 MED ORDER — CALCITRIOL 0.5 MCG PO CAPS: 0.5000 ug | ORAL_CAPSULE | Freq: Every day | ORAL | 0 refills | Status: DC

## 2016-11-05 MED ORDER — ESCITALOPRAM OXALATE 10 MG PO TABS: ORAL_TABLET | ORAL | 1 refills | Status: DC

## 2016-11-05 MED ORDER — METOLAZONE 5 MG PO TABS: 5.0000 mg | ORAL_TABLET | Freq: Every day | ORAL | 0 refills | Status: DC

## 2016-11-05 MED ORDER — TORSEMIDE 20 MG PO TABS: 40.0000 mg | ORAL_TABLET | Freq: Every day | ORAL | 0 refills | Status: DC

## 2016-11-05 MED ORDER — AMLODIPINE BESYLATE 10 MG PO TABS: 10.0000 mg | ORAL_TABLET | Freq: Every evening | ORAL | 0 refills | Status: DC

## 2016-11-05 MED ORDER — POTASSIUM CHLORIDE CRYS ER 20 MEQ PO TBCR: 20.0000 meq | EXTENDED_RELEASE_TABLET | Freq: Two times a day (BID) | ORAL | 0 refills | Status: DC

## 2016-11-06 ENCOUNTER — Other Ambulatory Visit: Payer: Self-pay | Admitting: Family Medicine

## 2016-11-06 ENCOUNTER — Encounter: Payer: Self-pay | Admitting: Family Medicine

## 2016-11-06 DIAGNOSIS — I87311 Chronic venous hypertension (idiopathic) with ulcer of right lower extremity: Secondary | ICD-10-CM | POA: Diagnosis not present

## 2016-11-06 NOTE — Telephone Encounter (Signed)
Please schedule a follow-up appt for pt with me for BP and med check - remind pt to bring all of his meds with him - in 2-3 months. Thanks, Harmon Pier

## 2016-11-12 DIAGNOSIS — I87311 Chronic venous hypertension (idiopathic) with ulcer of right lower extremity: Secondary | ICD-10-CM | POA: Diagnosis not present

## 2016-11-15 ENCOUNTER — Ambulatory Visit (INDEPENDENT_AMBULATORY_CARE_PROVIDER_SITE_OTHER): Payer: Medicare Other | Admitting: Family Medicine

## 2016-11-15 ENCOUNTER — Encounter: Payer: Self-pay | Admitting: Family Medicine

## 2016-11-15 VITALS — BP 120/67 | HR 79 | Temp 98.3°F | Resp 18 | Ht 69.5 in | Wt 350.0 lb

## 2016-11-15 DIAGNOSIS — N185 Chronic kidney disease, stage 5: Secondary | ICD-10-CM

## 2016-11-15 DIAGNOSIS — M1712 Unilateral primary osteoarthritis, left knee: Secondary | ICD-10-CM | POA: Diagnosis not present

## 2016-11-15 DIAGNOSIS — Z794 Long term (current) use of insulin: Secondary | ICD-10-CM

## 2016-11-15 DIAGNOSIS — E1165 Type 2 diabetes mellitus with hyperglycemia: Secondary | ICD-10-CM

## 2016-11-15 DIAGNOSIS — E1122 Type 2 diabetes mellitus with diabetic chronic kidney disease: Secondary | ICD-10-CM

## 2016-11-15 DIAGNOSIS — IMO0002 Reserved for concepts with insufficient information to code with codable children: Secondary | ICD-10-CM

## 2016-11-15 LAB — POCT GLYCOSYLATED HEMOGLOBIN (HGB A1C): HEMOGLOBIN A1C: 7

## 2016-11-15 MED ORDER — CAPSAICIN 0.075 % EX CREA: 1.0000 "application " | TOPICAL_CREAM | Freq: Three times a day (TID) | CUTANEOUS | 2 refills | Status: DC

## 2016-11-15 NOTE — Progress Notes (Signed)
Subjective:  By signing my name below, I, Essence Howell, attest that this documentation has been prepared under the direction and in the presence of [name], MD Electronically Signed: Ladene Artist, ED Scribe 11/15/2016 at 10:38 AM.   Patient ID: Blake Ina Sr., male    DOB: 1950/10/02, 66 y.o.   MRN: [id number]  Chief Complaint  Patient presents with  . Hypertension    follow up with labs per patient   HPI Blake Shepperson Sr. is a 66 y.o. male, with a h/o HTN, who presents to Primary Care at [facility] for lab work. Pt is not fasting at this visit. Pt last saw his nephrologist [name] on 3/14. His son states that pt was told that his access is small and he may need to get another one.   DM Pt no longer checks his blood glucose at home. States he only checked them when a therapist came out to his home twice/week.   Right Leg Wound Pt has been following up with the wound center once a week to have a dressing changed to his wound on his right lower extremity.   Knee Pain Pt reports intermittent bilateral knee pain, left worse than right, for a while. He states that knee pain is exacerbated with ambulating more often. Pt states that he has been using Tylenol x 3 daily with temporary relief.   Pt sleeps in a flat bed with 1-2 pillows. He denies cough, difficulty breathing with lying at night, sob, heartburn, indigestion, environmental allergies.   Past Medical History:  Diagnosis Date  . Chronic kidney disease (CKD), stage III (moderate)    sees dr Florene Glen nephrology every 6-9 months  . CVA (cerebral vascular accident) [facility]) 12/2013   admitted in July 2015 for acute right-sided infarct/notes 12/02/2014  . Hypercholesterolemia   . Hypertension   . Multiple wounds    on lower legs - sees [name] Pain at the [facility]  . OSA on CPAP    uses cpap, setting of 5  . Prolactin secreting pituitary adenoma ([place])    [name] 12/02/2014  . Stroke ([place]) 12/02/2014   expressive aphasia  .  Systolic CHF ([place])   . Type II diabetes mellitus ([place])    uncontrolled/notes 12/02/2014   Current Outpatient Prescriptions on File Prior to Visit  Medication Sig Dispense Refill  . amLODipine (NORVASC) 10 MG tablet Take 1 tablet (10 mg total) by mouth every evening. 90 tablet 0  . aspirin 325 MG tablet Take 325 mg by mouth daily at 12 noon.     Marland Kitchen atorvastatin (LIPITOR) 80 MG tablet Take 1 tablet (80 mg total) by mouth daily at 6 PM. 90 tablet 0  . calcitRIOL (ROCALTROL) 0.5 MCG capsule Take 1 capsule (0.5 mcg total) by mouth daily. 90 capsule 0  . clopidogrel (PLAVIX) 75 MG tablet TAKE 1 TABLET BY MOUTH EVERY DAY WITH BREAKFAST 90 tablet 1  . escitalopram (LEXAPRO) 10 MG tablet Take 1 tablet by mouth daily in evening 90 tablet 1  . hydrALAZINE (APRESOLINE) 25 MG tablet Take 3 tablets (75 mg total) by mouth 3 (three) times daily. 810 tablet 0  . isosorbide mononitrate (IMDUR) 120 MG 24 hr tablet TAKE 1 TABLET BY MOUTH DAILY. 30 tablet 2  . labetalol (NORMODYNE) 300 MG tablet Take 1 tablet (300 mg total) by mouth 3 (three) times daily. 270 tablet 0  . metolazone (ZAROXOLYN) 5 MG tablet Take 1 tablet (5 mg total) by mouth daily. 90 tablet 0  .  potassium chloride SA (K-DUR,KLOR-CON) 20 MEQ tablet Take 1 tablet (20 mEq total) by mouth 2 (two) times daily. 180 tablet 0  . torsemide (DEMADEX) 20 MG tablet Take 2 tablets (40 mg total) by mouth daily. 180 tablet 0   No current facility-administered medications on file prior to visit.    No Known Allergies   Past Surgical History:  Procedure Laterality Date  . AV FISTULA PLACEMENT Left 09/27/2015   Procedure: RADIOCEPHALIC ARTERIOVENOUS (AV) FISTULA CREATION LEFT ARM;  Surgeon: Mal Misty, MD;  Location: [facility];  Service: Vascular;  Laterality: Left;  . COLONOSCOPY  6-7 yrs ago  . COLONOSCOPY N/A 04/05/2013   Procedure: COLONOSCOPY;  Surgeon: Juanita Craver, MD;  [facility];  Service: Endoscopy;  Laterality: N/A;   Family History    Problem Relation Age of Onset  . Diabetes Mother   . Hypertension Mother   . Diabetes Father   . Hypertension Father   . Asthma Son   . Diabetes Sister   . Diabetes Brother    Social History   Social History  . Marital status: Married    Spouse name: N/A  . Number of children: 3  . Years of education: 12th    Occupational History  . part-time/full self emp.    Social History Main Topics  . Smoking status: Former Smoker    Packs/day: 0.25    Years: 2.00    Types: Cigarettes    Quit date: 06/17/1970  . Smokeless tobacco: Never Used  . Alcohol use No  . Drug use: Yes    Types: Marijuana     Comment: H/O marijuana use x 1 many years ago  . Sexual activity: Yes   Other Topics Concern  . None   Social History Narrative   Patient lives at home with his wife.   Patient right handed   Patient drinks soda's and coffee   Depression screen [facility] 2/9 11/15/2016 09/05/2016 03/12/2016 02/17/2016 07/11/2015  Decreased Interest 0 0 0 0 0  Down, Depressed, Hopeless 0 0 0 0 0  PHQ - 2 Score 0 0 0 0 0     Review of Systems  Respiratory: Negative for cough and shortness of breath.   Musculoskeletal: Positive for arthralgias.  Skin: Positive for wound.  Allergic/Immunologic: Negative for environmental allergies.      Objective:   Physical Exam  Constitutional: He is oriented to person, place, and time. He appears well-developed and well-nourished. No distress.  HENT:  Head: Normocephalic and atraumatic.  Eyes: Conjunctivae and EOM are normal.  Neck: Neck supple. No tracheal deviation present.  Cardiovascular: Normal rate, regular rhythm and normal heart sounds.  Exam reveals no gallop and no friction rub.   No murmur heard. Pulmonary/Chest: Effort normal. No respiratory distress. He has rales.  Coarse breath sounds with rales in the L lower lobe  Musculoskeletal: Normal range of motion. He exhibits edema.  Edematous legs 1+ pitting  Neurological: He is alert and oriented to  person, place, and time.  Skin: Skin is warm and dry.  Psychiatric: He has a normal mood and affect. His behavior is normal.  Nursing note and vitals reviewed.  BP (!) 162/81 (BP Location: Right Arm, Patient Position: Sitting, Cuff Size: Large)   Pulse 79   Temp 98.3 F (36.8 C) (Oral)   Resp 18   Ht 5' 9.5" (1.765 m)   Wt (!) 350 lb (158.8 kg)   SpO2 96%   BMI 50.94 kg/m  Assessment & Plan:  Send all labs over to [name] at [facility].   1. Insulin dependent type 2 diabetes mellitus, uncontrolled ([place])   2. Stage 5 chronic kidney disease due to type 2 diabetes mellitus ([place])   3. Primary osteoarthritis of left knee   Encouraged starting PACE enrollement/program. Suspect will need to start dialysis as become more symptomatic.  Orders Placed This Encounter  Procedures  . Comprehensive metabolic panel  . CBC with Differential/Platelet  . POCT glycosylated hemoglobin (Hb A1C)    Meds ordered this encounter  Medications  . capsicum (ZOSTRIX) 0.075 % topical cream    Sig: Apply 1 application topically 3 (three) times daily. To left knee    Dispense:  56 g    Refill:  2    I personally performed the services described in this documentation, which was scribed in my presence. The recorded information has been reviewed and considered, and addended by me as needed.   [name], M.D.  Primary Care at [facility], [place] [address] [phone] phone [phone] fax  11/17/16 11:21 PM  Results for orders placed or performed in visit on 11/15/16  Comprehensive metabolic panel  Result Value Ref Range   Glucose 123 (H) 65 - 99 mg/dL   BUN 111 (HH) 8 - 27 mg/dL   Creatinine, Ser 7.87 (>) 0.76 - 1.27 mg/dL   GFR calc non Af Amer 6 (L) >59 mL/min/1.73   GFR calc Af Amer 8 (L) >59 mL/min/1.73   BUN/Creatinine Ratio 14 10 - 24   Sodium 139 134 - 144 mmol/L   Potassium 4.0 3.5 - 5.2 mmol/L   Chloride 98 96 - 106 mmol/L   CO2 19 18 - 29 mmol/L     Calcium 10.5 (H) 8.6 - 10.2 mg/dL   Total Protein 8.0 6.0 - 8.5 g/dL   Albumin 3.8 3.6 - 4.8 g/dL   Globulin, Total 4.2 1.5 - 4.5 g/dL   Albumin/Globulin Ratio 0.9 (L) 1.2 - 2.2   Bilirubin Total 0.3 0.0 - 1.2 mg/dL   Alkaline Phosphatase 64 39 - 117 IU/L   AST 11 0 - 40 IU/L   ALT 7 0 - 44 IU/L  CBC with Differential/Platelet  Result Value Ref Range   WBC 9.2 3.4 - 10.8 x10E3/uL   RBC 3.19 (L) 4.14 - 5.80 x10E6/uL   Hemoglobin 9.4 (L) 13.0 - 17.7 g/dL   Hematocrit 28.4 (L) 37.5 - 51.0 %   MCV 89 79 - 97 fL   MCH 29.5 26.6 - 33.0 pg   MCHC 33.1 31.5 - 35.7 g/dL   RDW 16.3 (H) 12.3 - 15.4 %   Platelets 378 150 - 379 x10E3/uL   Neutrophils 72 Not Estab. %   Lymphs 14 Not Estab. %   Monocytes 11 Not Estab. %   Eos 2 Not Estab. %   Basos 1 Not Estab. %   Neutrophils Absolute 6.7 1.4 - 7.0 x10E3/uL   Lymphocytes Absolute 1.3 0.7 - 3.1 x10E3/uL   Monocytes Absolute 1.0 (H) 0.1 - 0.9 x10E3/uL   EOS (ABSOLUTE) 0.2 0.0 - 0.4 x10E3/uL   Basophils Absolute 0.1 0.0 - 0.2 x10E3/uL   Immature Granulocytes 0 Not Estab. %   Immature Grans (Abs) 0.0 0.0 - 0.1 x10E3/uL  POCT glycosylated hemoglobin (Hb A1C)  Result Value Ref Range   Hemoglobin A1C 7.0

## 2016-11-16 LAB — COMPREHENSIVE METABOLIC PANEL
A/G RATIO: 0.9 — AB (ref 1.2–2.2)
ALK PHOS: 64 IU/L (ref 39–117)
ALT: 7 IU/L (ref 0–44)
AST: 11 IU/L (ref 0–40)
Albumin: 3.8 g/dL (ref 3.6–4.8)
BUN/Creatinine Ratio: 14 (ref 10–24)
BUN: 111 mg/dL (ref 8–27)
Bilirubin Total: 0.3 mg/dL (ref 0.0–1.2)
CALCIUM: 10.5 mg/dL — AB (ref 8.6–10.2)
CHLORIDE: 98 mmol/L (ref 96–106)
CO2: 19 mmol/L (ref 18–29)
Creatinine, Ser: 7.87 mg/dL (ref 0.76–1.27)
GFR calc Af Amer: 8 mL/min/{1.73_m2} — ABNORMAL LOW (ref >59)
GFR, EST NON AFRICAN AMERICAN: 6 mL/min/{1.73_m2} — AB (ref >59)
Globulin, Total: 4.2 g/dL (ref 1.5–4.5)
Glucose: 123 mg/dL — ABNORMAL HIGH (ref 65–99)
POTASSIUM: 4 mmol/L (ref 3.5–5.2)
Sodium: 139 mmol/L (ref 134–144)
Total Protein: 8 g/dL (ref 6.0–8.5)

## 2016-11-16 LAB — CBC WITH DIFFERENTIAL/PLATELET
BASOS ABS: 0.1 10*3/uL (ref 0.0–0.2)
BASOS: 1 %
EOS (ABSOLUTE): 0.2 10*3/uL (ref 0.0–0.4)
Eos: 2 %
Hematocrit: 28.4 % — ABNORMAL LOW (ref 37.5–51.0)
Hemoglobin: 9.4 g/dL — ABNORMAL LOW (ref 13.0–17.7)
IMMATURE GRANS (ABS): 0 10*3/uL (ref 0.0–0.1)
IMMATURE GRANULOCYTES: 0 %
LYMPHS: 14 %
Lymphocytes Absolute: 1.3 10*3/uL (ref 0.7–3.1)
MCH: 29.5 pg (ref 26.6–33.0)
MCHC: 33.1 g/dL (ref 31.5–35.7)
MCV: 89 fL (ref 79–97)
Monocytes Absolute: 1 10*3/uL — ABNORMAL HIGH (ref 0.1–0.9)
Monocytes: 11 %
NEUTROS PCT: 72 %
Neutrophils Absolute: 6.7 10*3/uL (ref 1.4–7.0)
PLATELETS: 378 10*3/uL (ref 150–379)
RBC: 3.19 x10E6/uL — AB (ref 4.14–5.80)
RDW: 16.3 % — AB (ref 12.3–15.4)
WBC: 9.2 10*3/uL (ref 3.4–10.8)

## 2016-11-18 ENCOUNTER — Encounter (HOSPITAL_COMMUNITY): Payer: Self-pay | Admitting: Emergency Medicine

## 2016-11-18 ENCOUNTER — Telehealth: Payer: Self-pay

## 2016-11-18 ENCOUNTER — Emergency Department (HOSPITAL_COMMUNITY)
Admission: EM | Admit: 2016-11-18 | Discharge: 2016-11-18 | Disposition: A | Discharge status: Home / Self Care | Payer: Medicare Other | Attending: Emergency Medicine | Admitting: Emergency Medicine

## 2016-11-18 DIAGNOSIS — Z8673 Personal history of transient ischemic attack (TIA), and cerebral infarction without residual deficits: Secondary | ICD-10-CM | POA: Insufficient documentation

## 2016-11-18 DIAGNOSIS — E1122 Type 2 diabetes mellitus with diabetic chronic kidney disease: Secondary | ICD-10-CM | POA: Insufficient documentation

## 2016-11-18 DIAGNOSIS — Z87891 Personal history of nicotine dependence: Secondary | ICD-10-CM | POA: Insufficient documentation

## 2016-11-18 DIAGNOSIS — I130 Hypertensive heart and chronic kidney disease with heart failure and stage 1 through stage 4 chronic kidney disease, or unspecified chronic kidney disease: Secondary | ICD-10-CM | POA: Insufficient documentation

## 2016-11-18 DIAGNOSIS — Z79899 Other long term (current) drug therapy: Secondary | ICD-10-CM | POA: Insufficient documentation

## 2016-11-18 DIAGNOSIS — N184 Chronic kidney disease, stage 4 (severe): Secondary | ICD-10-CM | POA: Insufficient documentation

## 2016-11-18 DIAGNOSIS — Z7982 Long term (current) use of aspirin: Secondary | ICD-10-CM | POA: Insufficient documentation

## 2016-11-18 DIAGNOSIS — N189 Chronic kidney disease, unspecified: Secondary | ICD-10-CM

## 2016-11-18 DIAGNOSIS — R339 Retention of urine, unspecified: Secondary | ICD-10-CM | POA: Diagnosis present

## 2016-11-18 DIAGNOSIS — I5023 Acute on chronic systolic (congestive) heart failure: Secondary | ICD-10-CM | POA: Diagnosis not present

## 2016-11-18 LAB — I-STAT CHEM 8, ED
BUN: 112 mg/dL — ABNORMAL HIGH (ref 6–20)
CHLORIDE: 100 mmol/L — AB (ref 101–111)
Calcium, Ion: 1.25 mmol/L (ref 1.15–1.40)
Creatinine, Ser: 8.1 mg/dL — ABNORMAL HIGH (ref 0.61–1.24)
Glucose, Bld: 133 mg/dL — ABNORMAL HIGH (ref 65–99)
HEMATOCRIT: 27 % — AB (ref 39.0–52.0)
HEMOGLOBIN: 9.2 g/dL — AB (ref 13.0–17.0)
POTASSIUM: 3.7 mmol/L (ref 3.5–5.1)
Sodium: 140 mmol/L (ref 135–145)
TCO2: 27 mmol/L (ref 0–100)

## 2016-11-18 MED ORDER — CAPSAICIN 0.033 % EX CREA: 1.0000 "application " | TOPICAL_CREAM | Freq: Three times a day (TID) | CUTANEOUS | 11 refills | Status: DC | PRN

## 2016-11-18 NOTE — Telephone Encounter (Signed)
done

## 2016-11-18 NOTE — ED Triage Notes (Signed)
Pt in from home via [facility] with c/o anuria last night. Per EMS, pt was able to void 200 ml's when they arrived today. Hx of CHF, CVA with residual apahsia (per son), pre-dialysis pt with fistula to L arm. Pt alert, VSS, afebrile and denies burning with urination

## 2016-11-18 NOTE — Discharge Instructions (Signed)
Return if concern for any reason or contact Dr.Powell

## 2016-11-18 NOTE — Telephone Encounter (Signed)
zostrix cream  0.075% not available  0.033% is   Please send new rx if appropriate

## 2016-11-18 NOTE — ED Provider Notes (Signed)
[facility] DEPT Provider Note   CSN: [id number] Arrival date & time: 11/18/16  0703     History   Chief Complaint Chief Complaint  Patient presents with  . Urinary Retention    HPI Blake Bell. is a 66 y.o. male.  HPI Patient's wife was worried that he could not urinate earlier today. However he urinated immediately prior to coming to the hospital this morning. Brought by EMS. Presently asymptomatic. No treatment prior to coming here Past Medical History:  Diagnosis Date  . Chronic kidney disease (CKD), stage III (moderate)    sees dr Florene Glen nephrology every 6-9 months  . CVA (cerebral vascular accident) [facility]) 12/2013   admitted in July 2015 for acute right-sided infarct/notes 12/02/2014  . Hypercholesterolemia   . Hypertension   . Multiple wounds    on lower legs - sees [name] Pain at the [facility]  . OSA on CPAP    uses cpap, setting of 5  . 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 Stroke ([place]) 12/02/2014   expressive aphasia  . Systolic CHF ([place])   . Type II diabetes mellitus ([place])    uncontrolled/notes 12/02/2014    Patient Active Problem List   Diagnosis Date Noted  . Expressive aphasia 09/06/2016  . Weakness following cerebrovascular accident (CVA) 09/06/2016  . Poor tolerance for ambulation 09/06/2016  . Chronic kidney disease (CKD), stage IV (severe) ([place]) 09/12/2015  . Chronic diastolic heart failure, NYHA class 2 ([place]) 08/08/2015  . Anemia, iron deficiency 06/04/2015  . Acute on chronic systolic (congestive) heart failure ([place]) 05/29/2015  . Systolic CHF ([place]) 41/58/3094  . Uncontrolled diabetes with stage 4 chronic kidney disease GFR 15-29 ([place]) 12/28/2014  . Type II diabetes mellitus with neurological manifestations ([place]) 12/12/2014  . Acute ischemic left MCA stroke ([place]) 12/02/2014  . Type 2 diabetes mellitus with complication ([place]) 07/68/0881  . Essential hypertension, benign 03/24/2014  . HLD (hyperlipidemia)  03/24/2014  . CVA (cerebral infarction) 01/13/2014  . Hypernatremia 07/03/2013  . Morbid obesity ([place]) 07/03/2013  . Venous stasis ulcers ([place]) 07/03/2013  . DM (diabetes mellitus) type 2, uncontrolled, with ketoacidosis ([place]) 07/03/2013  . 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 Sellar or suprasellar mass 06/22/2013  . Acute respiratory failure ([place]) 06/22/2013  . Seizure ([place]) 06/21/2013  . Altered mental status 06/21/2013  . Hypercarbia 06/21/2013  . OSA (obstructive sleep apnea) 05/25/2013    Past Surgical History:  Procedure Laterality Date  . AV FISTULA PLACEMENT Left 09/27/2015   Procedure: RADIOCEPHALIC ARTERIOVENOUS (AV) FISTULA CREATION LEFT ARM;  Surgeon: Mal Misty, MD;  Location: [facility];  Service: Vascular;  Laterality: Left;  . COLONOSCOPY  6-7 yrs ago  . COLONOSCOPY N/A 04/05/2013   Procedure: COLONOSCOPY;  Surgeon: Juanita Craver, MD;  Location: [facility];  Service: Endoscopy;  Laterality: N/A;       Home Medications    Prior to Admission medications   Medication Sig Start Date End Date Taking? Authorizing Provider  amLODipine (NORVASC) 10 MG tablet Take 1 tablet (10 mg total) by mouth every evening. 11/05/16   Shawnee Knapp, MD  aspirin 325 MG tablet Take 325 mg by mouth daily at 12 noon.     [provider]  atorvastatin (LIPITOR) 80 MG tablet Take 1 tablet (80 mg total) by mouth daily at 6 PM. 10/18/16   Shawnee Knapp, MD  calcitRIOL (ROCALTROL) 0.5 MCG capsule Take 1 capsule (0.5 mcg total) by mouth daily. 11/05/16  Shawnee Knapp, MD  capsicum (ZOSTRIX) 0.075 % topical cream Apply 1 application topically 3 (three) times daily. To left knee 11/15/16   Shawnee Knapp, MD  clopidogrel (PLAVIX) 75 MG tablet TAKE 1 TABLET BY MOUTH EVERY DAY WITH BREAKFAST 09/28/16   Shawnee Knapp, MD  escitalopram (LEXAPRO) 10 MG tablet Take 1 tablet by mouth daily in evening 11/05/16   Shawnee Knapp, MD  hydrALAZINE (APRESOLINE) 25 MG tablet Take 3 tablets (75 mg total) by mouth 3 (three)  times daily. 11/05/16   Shawnee Knapp, MD  isosorbide mononitrate (IMDUR) 120 MG 24 hr tablet TAKE 1 TABLET BY MOUTH DAILY. 10/04/16   Shawnee Knapp, MD  labetalol (NORMODYNE) 300 MG tablet Take 1 tablet (300 mg total) by mouth 3 (three) times daily. 11/05/16   Shawnee Knapp, MD  metolazone (ZAROXOLYN) 5 MG tablet Take 1 tablet (5 mg total) by mouth daily. 11/05/16   Shawnee Knapp, MD  potassium chloride SA (K-DUR,KLOR-CON) 20 MEQ tablet Take 1 tablet (20 mEq total) by mouth 2 (two) times daily. 11/05/16   Shawnee Knapp, MD  torsemide (DEMADEX) 20 MG tablet Take 2 tablets (40 mg total) by mouth daily. 11/05/16   Shawnee Knapp, MD    Family History Family History  Problem Relation Age of Onset  . Diabetes Mother   . Hypertension Mother   . Diabetes Father   . Hypertension Father   . Asthma Son   . Diabetes Sister   . Diabetes Brother     Social History Social History  Substance Use Topics  . Smoking status: Former Smoker    Packs/day: 0.25    Years: 2.00    Types: Cigarettes    Quit date: 06/17/1970  . Smokeless tobacco: Never Used  . Alcohol use No     Allergies   Patient has no known allergies.   Review of Systems Review of Systems  Genitourinary:       Chronic kidney disease.  Musculoskeletal: Positive for gait problem.  Allergic/Immunologic: Positive for immunocompromised state.       Diabetic  Neurological: Positive for speech difficulty.  All other systems reviewed and are negative.    Physical Exam Updated Vital Signs BP 132/75   Pulse 77   Resp 16   Ht 5' 9.5" (1.765 m)   Wt (!) 158.8 kg (350 lb)   SpO2 99%   BMI 50.94 kg/m   Physical Exam  Constitutional: No distress.  Chronically ill-appearing  HENT:  Head: Normocephalic and atraumatic.  Eyes: Conjunctivae are normal. Pupils are equal, round, and reactive to light.  Neck: Neck supple. No tracheal deviation present. No thyromegaly present.  Cardiovascular: Normal rate and regular rhythm.   No murmur  heard. Pulmonary/Chest: Effort normal and breath sounds normal.  Abdominal: Soft. Bowel sounds are normal. He exhibits no distension. There is no tenderness.  Obese  Genitourinary: Penis normal.  Genitourinary Comments: Normal male genitalia  Musculoskeletal: Normal range of motion. He exhibits no edema or tenderness.  Left upper extremity with dialysis fistula with good thrill  Neurological: He is alert. Coordination normal.  Skin: Skin is warm and dry. No rash noted.  Psychiatric: He has a normal mood and affect.  Nursing note and vitals reviewed.    ED Treatments / Results  Labs (all labs ordered are listed, but only abnormal results are displayed) Labs Reviewed  I-STAT CHEM 8, ED  Bladder scan showed 47 mL of urine, insignificant Results for  orders placed or performed during the hospital encounter of 11/18/16  I-stat chem 8, ed  Result Value Ref Range   Sodium 140 135 - 145 mmol/L   Potassium 3.7 3.5 - 5.1 mmol/L   Chloride 100 (L) 101 - 111 mmol/L   BUN 112 (H) 6 - 20 mg/dL   Creatinine, Ser 8.10 (H) 0.61 - 1.24 mg/dL   Glucose, Bld 133 (H) 65 - 99 mg/dL   Calcium, Ion 1.25 1.15 - 1.40 mmol/L   TCO2 27 0 - 100 mmol/L   Hemoglobin 9.2 (L) 13.0 - 17.0 g/dL   HCT 27.0 (L) 39.0 - 52.0 %   No results found.  EKG  EKG Interpretation None       Radiology No results found.  Procedures Procedures (including critical care time)  Medications Ordered in ED Medications - No data to display   Initial Impression / Assessment and Plan / ED Course  I have reviewed the triage vital signs and the nursing notes.  Pertinent labs & imaging results that were available during my care of the patient were reviewed by me and considered in my medical decision making (see chart for details).     Renal function essentially unchanged from 3 days ago. Plan follow-up with [name] or return as needed 8 AM patient remains asymptomatic Final Clinical Impressions(s) / ED Diagnoses   Diagnosis chronic renal failure Final diagnoses:  None    New Prescriptions New Prescriptions   No medications on file     Orlie Dakin, MD 11/18/16 [phone]

## 2016-11-18 NOTE — ED Notes (Signed)
Bladder scan: 81mL

## 2016-11-19 ENCOUNTER — Encounter (HOSPITAL_BASED_OUTPATIENT_CLINIC_OR_DEPARTMENT_OTHER): Payer: Medicare Other | Attending: Surgery

## 2016-11-19 DIAGNOSIS — I87331 Chronic venous hypertension (idiopathic) with ulcer and inflammation of right lower extremity: Secondary | ICD-10-CM | POA: Insufficient documentation

## 2016-11-19 DIAGNOSIS — L97812 Non-pressure chronic ulcer of other part of right lower leg with fat layer exposed: Secondary | ICD-10-CM | POA: Insufficient documentation

## 2016-11-19 DIAGNOSIS — E1151 Type 2 diabetes mellitus with diabetic peripheral angiopathy without gangrene: Secondary | ICD-10-CM | POA: Insufficient documentation

## 2016-11-19 DIAGNOSIS — I509 Heart failure, unspecified: Secondary | ICD-10-CM | POA: Insufficient documentation

## 2016-11-19 DIAGNOSIS — E11622 Type 2 diabetes mellitus with other skin ulcer: Secondary | ICD-10-CM | POA: Insufficient documentation

## 2016-11-19 DIAGNOSIS — G4730 Sleep apnea, unspecified: Secondary | ICD-10-CM | POA: Insufficient documentation

## 2016-11-19 DIAGNOSIS — I110 Hypertensive heart disease with heart failure: Secondary | ICD-10-CM | POA: Insufficient documentation

## 2016-11-19 DIAGNOSIS — I890 Lymphedema, not elsewhere classified: Secondary | ICD-10-CM | POA: Insufficient documentation

## 2016-11-27 ENCOUNTER — Ambulatory Visit (INDEPENDENT_AMBULATORY_CARE_PROVIDER_SITE_OTHER): Payer: Medicare Other | Admitting: Podiatry

## 2016-11-27 ENCOUNTER — Encounter: Payer: Self-pay | Admitting: Podiatry

## 2016-11-27 DIAGNOSIS — I110 Hypertensive heart disease with heart failure: Secondary | ICD-10-CM | POA: Diagnosis not present

## 2016-11-27 DIAGNOSIS — I509 Heart failure, unspecified: Secondary | ICD-10-CM | POA: Diagnosis not present

## 2016-11-27 DIAGNOSIS — E1151 Type 2 diabetes mellitus with diabetic peripheral angiopathy without gangrene: Secondary | ICD-10-CM | POA: Diagnosis not present

## 2016-11-27 DIAGNOSIS — B351 Tinea unguium: Secondary | ICD-10-CM

## 2016-11-27 DIAGNOSIS — I87331 Chronic venous hypertension (idiopathic) with ulcer and inflammation of right lower extremity: Secondary | ICD-10-CM | POA: Diagnosis present

## 2016-11-27 DIAGNOSIS — G4730 Sleep apnea, unspecified: Secondary | ICD-10-CM | POA: Diagnosis not present

## 2016-11-27 DIAGNOSIS — L97812 Non-pressure chronic ulcer of other part of right lower leg with fat layer exposed: Secondary | ICD-10-CM | POA: Diagnosis not present

## 2016-11-27 DIAGNOSIS — I890 Lymphedema, not elsewhere classified: Secondary | ICD-10-CM | POA: Diagnosis not present

## 2016-11-27 DIAGNOSIS — E11622 Type 2 diabetes mellitus with other skin ulcer: Secondary | ICD-10-CM | POA: Diagnosis not present

## 2016-11-27 NOTE — Patient Instructions (Signed)

## 2016-11-28 NOTE — Progress Notes (Signed)
Patient ID: Blake Labonte Sr., male   DOB: [birth year], 66 y.o.   MRN: [id number]   Subjective: This patient presents today complaining of thick and elongated toenails and requests toenail debridement. Patient was last evaluated on 07/31/2016 and scheduled for follow-up visit in 3 months, however, does not present to our office until 11/28/2016 Patient currently under care won't care for her right lower extremity wound and has a lower extremity compression dressing on the left lower leg and proximal one third of foot. Wound cares under management of this wound which was not evaluated Patient also requesting diabetic shoes, however, because patient has a dressing on his foot which will alter shoe size will defer on diabetic shoes at this time  Objective:   Patient approximate 5 foot 9 an approximate 345 pounds  Patient appears orientated 3, however, is slow to respond to direct questioning  vascular:  DP pulses 2/4 bilaterally PT pulses 0/4 bilaterally Capillary reflex within normal limits bilaterally  Neurological: Sensation to 10 g monofilament wire intact 4/5 bilaterally Vibratory sensation reactive bilaterally Ankle reflexes nonreactive bilaterally  Dermatological: Compression dressing right lower extremity and proximal one third of right foot Dry skin bilaterally Callus distal left hallux Abundant adipose tissue left ankle Toenails elongated brittle, deformed and tender to direct palpation 6-10  Musculoskeletal: Pes planus bilaterally Hammertoe second bilaterally Manual motor testing dorsi flexion and plantar flexion 5/5 bilaterally Patient walks slowly with assistance of walker  Assessment Diabetic peripheral vascular disease: Diabetic with previously diagnosed absent pedal pulses Symptomatic mycotic toenails 6-10  Plan: On the visit of 07/31/2016 a lower extent arterial Doppler was ordered, however, patient did not comply with the order and did not and will not  have vascular lab examination Debridement of toenails 10 mechanically electronically without any bleeding  Defer on shoe molding until bandage and removed on right lower extremity  Reappoint 4 months

## 2016-12-01 ENCOUNTER — Other Ambulatory Visit: Payer: Self-pay | Admitting: Family Medicine

## 2016-12-02 ENCOUNTER — Other Ambulatory Visit: Payer: Self-pay

## 2016-12-02 DIAGNOSIS — I770 Arteriovenous fistula, acquired: Secondary | ICD-10-CM

## 2016-12-02 DIAGNOSIS — Z992 Dependence on renal dialysis: Principal | ICD-10-CM

## 2016-12-02 DIAGNOSIS — N186 End stage renal disease: Secondary | ICD-10-CM

## 2016-12-08 ENCOUNTER — Encounter (HOSPITAL_COMMUNITY): Payer: Self-pay | Admitting: Emergency Medicine

## 2016-12-08 ENCOUNTER — Emergency Department (HOSPITAL_COMMUNITY)
Admission: EM | Admit: 2016-12-08 | Discharge: 2016-12-08 | Disposition: A | Discharge status: Home / Self Care | Payer: Medicare Other | Attending: Emergency Medicine | Admitting: Emergency Medicine

## 2016-12-08 DIAGNOSIS — E1122 Type 2 diabetes mellitus with diabetic chronic kidney disease: Secondary | ICD-10-CM | POA: Diagnosis not present

## 2016-12-08 DIAGNOSIS — Z7982 Long term (current) use of aspirin: Secondary | ICD-10-CM | POA: Diagnosis not present

## 2016-12-08 DIAGNOSIS — Z79899 Other long term (current) drug therapy: Secondary | ICD-10-CM | POA: Insufficient documentation

## 2016-12-08 DIAGNOSIS — N184 Chronic kidney disease, stage 4 (severe): Secondary | ICD-10-CM | POA: Insufficient documentation

## 2016-12-08 DIAGNOSIS — I130 Hypertensive heart and chronic kidney disease with heart failure and stage 1 through stage 4 chronic kidney disease, or unspecified chronic kidney disease: Secondary | ICD-10-CM | POA: Insufficient documentation

## 2016-12-08 DIAGNOSIS — Z8673 Personal history of transient ischemic attack (TIA), and cerebral infarction without residual deficits: Secondary | ICD-10-CM | POA: Diagnosis not present

## 2016-12-08 DIAGNOSIS — I9589 Other hypotension: Secondary | ICD-10-CM | POA: Insufficient documentation

## 2016-12-08 DIAGNOSIS — I5023 Acute on chronic systolic (congestive) heart failure: Secondary | ICD-10-CM | POA: Diagnosis not present

## 2016-12-08 DIAGNOSIS — Z87891 Personal history of nicotine dependence: Secondary | ICD-10-CM | POA: Diagnosis not present

## 2016-12-08 DIAGNOSIS — I959 Hypotension, unspecified: Secondary | ICD-10-CM

## 2016-12-08 LAB — CBC WITH DIFFERENTIAL/PLATELET
BASOS ABS: 0.1 10*3/uL (ref 0.0–0.1)
Basophils Relative: 1 %
EOS PCT: 3 %
Eosinophils Absolute: 0.3 10*3/uL (ref 0.0–0.7)
HCT: 26.5 % — ABNORMAL LOW (ref 39.0–52.0)
Hemoglobin: 8.6 g/dL — ABNORMAL LOW (ref 13.0–17.0)
Lymphocytes Relative: 14 %
Lymphs Abs: 1.4 10*3/uL (ref 0.7–4.0)
MCH: 29.8 pg (ref 26.0–34.0)
MCHC: 32.5 g/dL (ref 30.0–36.0)
MCV: 91.7 fL (ref 78.0–100.0)
MONO ABS: 0.9 10*3/uL (ref 0.1–1.0)
Monocytes Relative: 9 %
Neutro Abs: 7.6 10*3/uL (ref 1.7–7.7)
Neutrophils Relative %: 73 %
Platelets: 289 10*3/uL (ref 150–400)
RBC: 2.89 MIL/uL — ABNORMAL LOW (ref 4.22–5.81)
RDW: 16.3 % — AB (ref 11.5–15.5)
WBC: 10.3 10*3/uL (ref 4.0–10.5)

## 2016-12-08 LAB — BASIC METABOLIC PANEL
ANION GAP: 11 (ref 5–15)
BUN: 75 mg/dL — AB (ref 6–20)
CALCIUM: 8.9 mg/dL (ref 8.9–10.3)
CO2: 27 mmol/L (ref 22–32)
CREATININE: 6.76 mg/dL — AB (ref 0.61–1.24)
Chloride: 99 mmol/L — ABNORMAL LOW (ref 101–111)
GFR calc Af Amer: 9 mL/min — ABNORMAL LOW (ref >60)
GFR, EST NON AFRICAN AMERICAN: 8 mL/min — AB (ref >60)
GLUCOSE: 209 mg/dL — AB (ref 65–99)
Potassium: 3 mmol/L — ABNORMAL LOW (ref 3.5–5.1)
Sodium: 137 mmol/L (ref 135–145)

## 2016-12-08 LAB — I-STAT TROPONIN, ED: Troponin i, poc: 0.03 ng/mL (ref 0.00–0.08)

## 2016-12-08 LAB — TROPONIN I: Troponin I: 0.04 ng/mL (ref <0.03)

## 2016-12-08 NOTE — ED Notes (Signed)
Pt  Is alert  And oriented x 3 and is verbally responsive. Pt  denies pain at this time.

## 2016-12-08 NOTE — ED Triage Notes (Signed)
Patient here with complaints of "not feeling well" while at seafood restaurant today. EMS reports hypotension on site, 80s/40s. Dialysis patient, dialysis yesterday.

## 2016-12-08 NOTE — ED Notes (Signed)
Bed: VX48 Expected date: 12/08/16 Expected time: 4:42 PM Means of arrival: Ambulance Comments: hypotension

## 2016-12-08 NOTE — ED Provider Notes (Signed)
[facility] DEPT Provider Note   CSN: [id number] Arrival date & time: 12/08/16  1653     History   Chief Complaint Chief Complaint  Patient presents with  . Hypotension    HPI Blake Watrous Sr. is a 66 y.o. male.  66 year old male presents via EMS due to hypotension. Patient was in his otherwise baseline state of health when he was sitting down having dinner and he became lightheaded and dizzy. Does have a history of renal failure and will last dialyzed yesterday. EMS was called and patient found to be hypotensive with blood pressure in 80s. Was given 500 mL of fluid and transported here. Denies any associated chest pain or chest pressure. He is not currently short of breath. Denies any blood loss. No recent medication changes.      Past Medical History:  Diagnosis Date  . Chronic kidney disease (CKD), stage III (moderate)    sees dr Florene Glen nephrology every 6-9 months  . CVA (cerebral vascular accident) [facility]) 12/2013   admitted in July 2015 for acute right-sided infarct/notes 12/02/2014  . Hypercholesterolemia   . Hypertension   . Multiple wounds    on lower legs - sees [name] Pain at the [facility]  . OSA on CPAP    uses cpap, setting of 5  . 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 Stroke ([place]) 12/02/2014   expressive aphasia  . Systolic CHF ([place])   . Type II diabetes mellitus ([place])    uncontrolled/notes 12/02/2014    Patient Active Problem List   Diagnosis Date Noted  . Expressive aphasia 09/06/2016  . Weakness following cerebrovascular accident (CVA) 09/06/2016  . Poor tolerance for ambulation 09/06/2016  . Chronic kidney disease (CKD), stage IV (severe) ([place]) 09/12/2015  . Chronic diastolic heart failure, NYHA class 2 ([place]) 08/08/2015  . Anemia, iron deficiency 06/04/2015  . Acute on chronic systolic (congestive) heart failure ([place]) 05/29/2015  . Systolic CHF ([place]) 92/04/9416  . Uncontrolled diabetes with stage 4 chronic kidney  disease GFR 15-29 ([place]) 12/28/2014  . Type II diabetes mellitus with neurological manifestations ([place]) 12/12/2014  . Acute ischemic left MCA stroke ([place]) 12/02/2014  . Type 2 diabetes mellitus with complication ([place]) 40/81/4481  . Essential hypertension, benign 03/24/2014  . HLD (hyperlipidemia) 03/24/2014  . CVA (cerebral infarction) 01/13/2014  . Hypernatremia 07/03/2013  . Morbid obesity ([place]) 07/03/2013  . Venous stasis ulcers ([place]) 07/03/2013  . DM (diabetes mellitus) type 2, uncontrolled, with ketoacidosis ([place]) 07/03/2013  . 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 Sellar or suprasellar mass 06/22/2013  . Acute respiratory failure ([place]) 06/22/2013  . Seizure ([place]) 06/21/2013  . Altered mental status 06/21/2013  . Hypercarbia 06/21/2013  . OSA (obstructive sleep apnea) 05/25/2013    Past Surgical History:  Procedure Laterality Date  . AV FISTULA PLACEMENT Left 09/27/2015   Procedure: RADIOCEPHALIC ARTERIOVENOUS (AV) FISTULA CREATION LEFT ARM;  Surgeon: Mal Misty, MD;  Location: [facility];  Service: Vascular;  Laterality: Left;  . COLONOSCOPY  6-7 yrs ago  . COLONOSCOPY N/A 04/05/2013   Procedure: COLONOSCOPY;  Surgeon: Juanita Craver, MD;  [facility];  Service: Endoscopy;  Laterality: N/A;       Home Medications    Prior to Admission medications   Medication Sig Start Date End Date Taking? Authorizing Provider  amLODipine (NORVASC) 10 MG tablet Take 1 tablet (10 mg total) by mouth every evening. 11/05/16   Shawnee Knapp, MD  aspirin 325 MG tablet Take 325  mg by mouth daily at 12 noon.     [provider]  atorvastatin (LIPITOR) 80 MG tablet Take 1 tablet (80 mg total) by mouth daily at 6 PM. 10/18/16   Shawnee Knapp, MD  calcitRIOL (ROCALTROL) 0.5 MCG capsule Take 1 capsule (0.5 mcg total) by mouth daily. 11/05/16   Shawnee Knapp, MD  Capsaicin 0.033 % CREA Apply 1 application topically 3 (three) times daily as needed (knee pain). 11/18/16   Shawnee Knapp, MD    clopidogrel (PLAVIX) 75 MG tablet TAKE 1 TABLET BY MOUTH EVERY DAY WITH BREAKFAST 09/28/16   Shawnee Knapp, MD  escitalopram (LEXAPRO) 10 MG tablet Take 1 tablet by mouth daily in evening 11/05/16   Shawnee Knapp, MD  hydrALAZINE (APRESOLINE) 25 MG tablet Take 3 tablets (75 mg total) by mouth 3 (three) times daily. 11/05/16   Shawnee Knapp, MD  isosorbide mononitrate (IMDUR) 120 MG 24 hr tablet TAKE 1 TABLET BY MOUTH DAILY. 10/04/16   Shawnee Knapp, MD  KLOR-CON M20 20 MEQ tablet TAKE 1 TABLET (20 MEQ TOTAL) BY MOUTH 2 (TWO) TIMES DAILY. 12/03/16   Shawnee Knapp, MD  labetalol (NORMODYNE) 300 MG tablet Take 1 tablet (300 mg total) by mouth 3 (three) times daily. 11/05/16   Shawnee Knapp, MD  metolazone (ZAROXOLYN) 5 MG tablet Take 1 tablet (5 mg total) by mouth daily. 11/05/16   Shawnee Knapp, MD  potassium chloride SA (K-DUR,KLOR-CON) 20 MEQ tablet Take 1 tablet (20 mEq total) by mouth 2 (two) times daily. 11/05/16   Shawnee Knapp, MD  torsemide (DEMADEX) 20 MG tablet Take 2 tablets (40 mg total) by mouth daily. 11/05/16   Shawnee Knapp, MD    Family History Family History  Problem Relation Age of Onset  . Diabetes Mother   . Hypertension Mother   . Diabetes Father   . Hypertension Father   . Asthma Son   . Diabetes Sister   . Diabetes Brother     Social History Social History  Substance Use Topics  . Smoking status: Former Smoker    Packs/day: 0.25    Years: 2.00    Types: Cigarettes    Quit date: 06/17/1970  . Smokeless tobacco: Never Used  . Alcohol use No     Allergies   Patient has no known allergies.   Review of Systems Review of Systems  All other systems reviewed and are negative.    Physical Exam Updated Vital Signs BP 105/69 (BP Location: Left Arm)   Pulse 67   Temp 98.5 F (36.9 C) (Oral)   Resp 18   SpO2 95%   Physical Exam  Constitutional: He is oriented to person, place, and time. He appears well-developed and well-nourished.  Non-toxic appearance. No distress.  HENT:   Head: Normocephalic and atraumatic.  Eyes: Conjunctivae, EOM and lids are normal. Pupils are equal, round, and reactive to light.  Neck: Normal range of motion. Neck supple. No tracheal deviation present. No thyroid mass present.  Cardiovascular: Normal rate, regular rhythm and normal heart sounds.  Exam reveals no gallop.   No murmur heard. Pulmonary/Chest: Effort normal and breath sounds normal. No stridor. No respiratory distress. He has no decreased breath sounds. He has no wheezes. He has no rhonchi. He has no rales.  Abdominal: Soft. Normal appearance and bowel sounds are normal. He exhibits no distension. There is no tenderness. There is no rebound and no CVA tenderness.  Musculoskeletal: Normal  range of motion. He exhibits no edema or tenderness.  Neurological: He is alert and oriented to person, place, and time. No cranial nerve deficit. GCS eye subscore is 4. GCS verbal subscore is 5. GCS motor subscore is 6.  Patient has baseline left-sided deficits from prior CVA which are unchanged according to him  Skin: Skin is warm and dry. No abrasion and no rash noted.  Psychiatric: His speech is delayed.  Nursing note and vitals reviewed.    ED Treatments / Results  Labs (all labs ordered are listed, but only abnormal results are displayed) Labs Reviewed  CBC WITH DIFFERENTIAL/PLATELET  BASIC METABOLIC PANEL  TROPONIN I    EKG  EKG Interpretation None       Radiology No results found.  Procedures Procedures (including critical care time)  Medications Ordered in ED Medications - No data to display   Initial Impression / Assessment and Plan / ED Course  I have reviewed the triage vital signs and the nursing notes.  Pertinent labs & imaging results that were available during my care of the patient were reviewed by me and considered in my medical decision making (see chart for details).     Patient negative orthostatics 2. States his breathing feels at baseline. Does  have mild hyperkalemia on his a choice but he is a dialysis patient. Troponin is at baseline and delta troponin negative. Will discharge home  Final Clinical Impressions(s) / ED Diagnoses   Final diagnoses:  None    New Prescriptions New Prescriptions   No medications on file     Lacretia Leigh, MD 12/08/16 2046

## 2016-12-10 DIAGNOSIS — I87331 Chronic venous hypertension (idiopathic) with ulcer and inflammation of right lower extremity: Secondary | ICD-10-CM | POA: Diagnosis not present

## 2016-12-17 ENCOUNTER — Encounter (HOSPITAL_BASED_OUTPATIENT_CLINIC_OR_DEPARTMENT_OTHER): Payer: Medicare Other | Attending: Surgery

## 2016-12-17 DIAGNOSIS — I87311 Chronic venous hypertension (idiopathic) with ulcer of right lower extremity: Secondary | ICD-10-CM | POA: Insufficient documentation

## 2016-12-17 DIAGNOSIS — L97212 Non-pressure chronic ulcer of right calf with fat layer exposed: Secondary | ICD-10-CM | POA: Diagnosis not present

## 2016-12-17 DIAGNOSIS — E11622 Type 2 diabetes mellitus with other skin ulcer: Secondary | ICD-10-CM | POA: Diagnosis not present

## 2016-12-17 DIAGNOSIS — I890 Lymphedema, not elsewhere classified: Secondary | ICD-10-CM | POA: Insufficient documentation

## 2016-12-30 ENCOUNTER — Telehealth: Payer: Self-pay | Admitting: Family Medicine

## 2016-12-30 NOTE — Telephone Encounter (Signed)
Deatra Canter from [facility] called to let us know that she has met with the pt and his son and are in the process of starting care.

## 2016-12-31 ENCOUNTER — Other Ambulatory Visit: Payer: Self-pay | Admitting: Family Medicine

## 2016-12-31 NOTE — Telephone Encounter (Signed)
FYI

## 2017-01-01 NOTE — Telephone Encounter (Signed)
[place]. Thanks.

## 2017-01-03 ENCOUNTER — Encounter: Payer: Self-pay | Admitting: Surgery

## 2017-01-13 ENCOUNTER — Encounter: Payer: Self-pay | Admitting: Surgery

## 2017-01-13 ENCOUNTER — Ambulatory Visit (HOSPITAL_COMMUNITY)
Admission: RE | Admit: 2017-01-13 | Discharge: 2017-01-13 | Disposition: A | Discharge status: Home / Self Care | Payer: Medicare Other | Source: Ambulatory Visit | Attending: Surgery | Admitting: Surgery

## 2017-01-13 ENCOUNTER — Other Ambulatory Visit: Payer: Self-pay | Admitting: Family Medicine

## 2017-01-13 ENCOUNTER — Ambulatory Visit (INDEPENDENT_AMBULATORY_CARE_PROVIDER_SITE_OTHER): Payer: Medicare Other | Admitting: Surgery

## 2017-01-13 VITALS — BP 147/88 | HR 71 | Temp 97.3°F | Resp 20 | Ht 69.0 in | Wt 315.0 lb

## 2017-01-13 DIAGNOSIS — Z992 Dependence on renal dialysis: Secondary | ICD-10-CM | POA: Diagnosis not present

## 2017-01-13 DIAGNOSIS — I770 Arteriovenous fistula, acquired: Secondary | ICD-10-CM | POA: Diagnosis not present

## 2017-01-13 DIAGNOSIS — N186 End stage renal disease: Secondary | ICD-10-CM | POA: Insufficient documentation

## 2017-01-13 NOTE — Progress Notes (Signed)
Vascular and Vein Specialist of [place]  Patient name: Blake Baisley Sr. MRN: [id number] DOB: December 10, 1950 Sex: male   REASON FOR VISIT:    Follow up fistula  HISOTRY OF PRESENT ILLNESS:    Blake Debord Sr. is a 66 y.o. male who is s/p left radio-cephalic fistula creation by Dr. Kellie Simmering on 09-27-2015.  He is now on hemodialysis Tuesday Thursday Saturday through a catheter.  They're unable to use his fistula.   PAST MEDICAL HISTORY:   Past Medical History:  Diagnosis Date  . Chronic kidney disease (CKD), stage III (moderate)    sees dr Florene Glen nephrology every 6-9 months  . CVA (cerebral vascular accident) [facility]) 12/2013   admitted in July 2015 for acute right-sided infarct/notes 12/02/2014  . Hypercholesterolemia   . Hypertension   . Multiple wounds    on lower legs - sees [name] Pain at the [place]  . OSA on CPAP    uses cpap, setting of 5  . 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 Stroke ([place]) 12/02/2014   expressive aphasia  . Systolic CHF ([place])   . Type II diabetes mellitus ([place])    uncontrolled/notes 12/02/2014     FAMILY HISTORY:   Family History  Problem Relation Age of Onset  . Diabetes Mother   . Hypertension Mother   . Diabetes Father   . Hypertension Father   . Asthma Son   . Diabetes Sister   . Diabetes Brother     SOCIAL HISTORY:   Social History  Substance Use Topics  . Smoking status: Former Smoker    Packs/day: 0.25    Years: 2.00    Types: Cigarettes    Quit date: 06/17/1970  . Smokeless tobacco: Never Used  . Alcohol use No     ALLERGIES:   No Known Allergies   CURRENT MEDICATIONS:   Current Outpatient Prescriptions  Medication Sig Dispense Refill  . aspirin 325 MG tablet Take 325 mg by mouth daily at 12 noon.     Marland Kitchen atorvastatin (LIPITOR) 80 MG tablet Take 1 tablet (80 mg total) by mouth daily at 6 PM. 90 tablet 0  . b complex-vitamin c-folic acid  (NEPHRO-VITE) 0.8 MG TABS tablet Take 1 tablet by mouth at bedtime.    . clopidogrel (PLAVIX) 75 MG tablet TAKE 1 TABLET BY MOUTH EVERY DAY WITH BREAKFAST 90 tablet 1  . escitalopram (LEXAPRO) 10 MG tablet Take 1 tablet by mouth daily in evening 90 tablet 1  . isosorbide mononitrate (IMDUR) 120 MG 24 hr tablet TAKE 1 TABLET BY MOUTH DAILY. 90 tablet 0  . Lanthanum Carbonate (FOSRENOL PO) Take by mouth 3 (three) times daily.     No current facility-administered medications for this visit.     REVIEW OF SYSTEMS:   [X]  denotes positive finding, [ ]  denotes negative finding Cardiac  Comments:  Chest pain or chest pressure:    Shortness of breath upon exertion:    Short of breath when lying flat:    Irregular heart rhythm:        Vascular    Pain in calf, thigh, or hip brought on by ambulation:    Pain in feet at night that wakes you up from your sleep:     Blood clot in your veins:    Leg swelling:         Pulmonary    Oxygen at home:    Productive cough:     Wheezing:  Neurologic    Sudden weakness in arms or legs:     Sudden numbness in arms or legs:     Sudden onset of difficulty speaking or slurred speech:    Temporary loss of vision in one eye:     Problems with dizziness:         Gastrointestinal    Blood in stool:     Vomited blood:         Genitourinary    Burning when urinating:     Blood in urine:        Psychiatric    Major depression:         Hematologic    Bleeding problems:    Problems with blood clotting too easily:        Skin    Rashes or ulcers:        Constitutional    Fever or chills:      PHYSICAL EXAM:   There were no vitals filed for this visit.  GENERAL: The patient is a well-nourished male, in no acute distress. The vital signs are documented above. CARDIAC: There is a regular rate and rhythm.  VASCULAR: Palpable thrill in the left radiocephalic fistula albeit faint PULMONARY: Non-labored respirations MUSCULOSKELETAL: There  are no major deformities or cyanosis. NEUROLOGIC: No focal weakness or paresthesias are detected. SKIN: There are no ulcers or rashes noted. PSYCHIATRIC: The patient has a normal affect.  STUDIES:   I have ordered and reviewed his fistula study.  This shows a vein that measures 0.32-0.37 cm from the wrist to the mid forearm.  In the mid forearm to the antecubital crease it measures 0.6 or greater.    MEDICAL ISSUES:   Non-maturing left radiocephalic fistula.  I discussed with the patient and family is to proceed with a fistulogram.  I told him that we would access the fistula near the antecubital crease and injected dye down towards the wrist to evaluate the fistula at this level if the vein is narrowed, angioplasty would be performed at that time.  If this vein appears to be sclerotic, if the cephalic vein looks healthy on fistulogram, he could be scheduled for a brachiocephalic fistula at that time.  If the entire cephalic vein does not appear to be suitable, he will need to be brought back for vein mapping and further discussions for surgery.  He is on dialysis Tuesday Thursday Saturday.  This will be scheduled for a nondialysis day.  Annamarie Major, MD Vascular and Vein Specialists of [facility] [phone] Pager [phone]

## 2017-01-16 ENCOUNTER — Other Ambulatory Visit: Payer: Self-pay

## 2017-01-17 ENCOUNTER — Encounter: Payer: Self-pay | Admitting: *Deleted

## 2017-01-17 ENCOUNTER — Encounter (HOSPITAL_COMMUNITY)
Admission: RE | Disposition: A | Discharge status: Home / Self Care | Payer: Self-pay | Source: Ambulatory Visit | Attending: Vascular Surgery

## 2017-01-17 ENCOUNTER — Other Ambulatory Visit: Payer: Self-pay | Admitting: *Deleted

## 2017-01-17 ENCOUNTER — Encounter (HOSPITAL_COMMUNITY): Payer: Self-pay | Admitting: Vascular Surgery

## 2017-01-17 ENCOUNTER — Ambulatory Visit (HOSPITAL_COMMUNITY)
Admission: RE | Admit: 2017-01-17 | Discharge: 2017-01-17 | Disposition: A | Discharge status: Home / Self Care | Payer: Medicare Other | Source: Ambulatory Visit | Attending: Vascular Surgery | Admitting: Vascular Surgery

## 2017-01-17 DIAGNOSIS — Z8249 Family history of ischemic heart disease and other diseases of the circulatory system: Secondary | ICD-10-CM | POA: Diagnosis not present

## 2017-01-17 DIAGNOSIS — G4733 Obstructive sleep apnea (adult) (pediatric): Secondary | ICD-10-CM | POA: Diagnosis not present

## 2017-01-17 DIAGNOSIS — Z7902 Long term (current) use of antithrombotics/antiplatelets: Secondary | ICD-10-CM | POA: Insufficient documentation

## 2017-01-17 DIAGNOSIS — Z8673 Personal history of transient ischemic attack (TIA), and cerebral infarction without residual deficits: Secondary | ICD-10-CM | POA: Insufficient documentation

## 2017-01-17 DIAGNOSIS — Z7982 Long term (current) use of aspirin: Secondary | ICD-10-CM | POA: Insufficient documentation

## 2017-01-17 DIAGNOSIS — Z87891 Personal history of nicotine dependence: Secondary | ICD-10-CM | POA: Insufficient documentation

## 2017-01-17 DIAGNOSIS — E7800 Pure hypercholesterolemia, unspecified: Secondary | ICD-10-CM | POA: Insufficient documentation

## 2017-01-17 DIAGNOSIS — Z833 Family history of diabetes mellitus: Secondary | ICD-10-CM | POA: Diagnosis not present

## 2017-01-17 DIAGNOSIS — Y832 Surgical operation with anastomosis, bypass or graft as the cause of abnormal reaction of the patient, or of later complication, without mention of misadventure at the time of the procedure: Secondary | ICD-10-CM | POA: Insufficient documentation

## 2017-01-17 DIAGNOSIS — N186 End stage renal disease: Secondary | ICD-10-CM | POA: Insufficient documentation

## 2017-01-17 DIAGNOSIS — T82858A Stenosis of vascular prosthetic devices, implants and grafts, initial encounter: Secondary | ICD-10-CM | POA: Insufficient documentation

## 2017-01-17 DIAGNOSIS — E1122 Type 2 diabetes mellitus with diabetic chronic kidney disease: Secondary | ICD-10-CM | POA: Diagnosis not present

## 2017-01-17 DIAGNOSIS — Z992 Dependence on renal dialysis: Secondary | ICD-10-CM | POA: Diagnosis not present

## 2017-01-17 DIAGNOSIS — I5022 Chronic systolic (congestive) heart failure: Secondary | ICD-10-CM | POA: Diagnosis not present

## 2017-01-17 DIAGNOSIS — I132 Hypertensive heart and chronic kidney disease with heart failure and with stage 5 chronic kidney disease, or end stage renal disease: Secondary | ICD-10-CM | POA: Diagnosis not present

## 2017-01-17 DIAGNOSIS — T82898A Other specified complication of vascular prosthetic devices, implants and grafts, initial encounter: Secondary | ICD-10-CM

## 2017-01-17 HISTORY — PX: A/V FISTULAGRAM: CATH118298

## 2017-01-17 LAB — POCT I-STAT, CHEM 8
BUN: 38 mg/dL — AB (ref 6–20)
Calcium, Ion: 1.18 mmol/L (ref 1.15–1.40)
Chloride: 98 mmol/L — ABNORMAL LOW (ref 101–111)
Creatinine, Ser: 5.1 mg/dL — ABNORMAL HIGH (ref 0.61–1.24)
Glucose, Bld: 171 mg/dL — ABNORMAL HIGH (ref 65–99)
HEMATOCRIT: 35 % — AB (ref 39.0–52.0)
HEMOGLOBIN: 11.9 g/dL — AB (ref 13.0–17.0)
Potassium: 4.9 mmol/L (ref 3.5–5.1)
SODIUM: 136 mmol/L (ref 135–145)
TCO2: 34 mmol/L (ref 0–100)

## 2017-01-17 LAB — GLUCOSE, CAPILLARY: GLUCOSE-CAPILLARY: 158 mg/dL — AB (ref 65–99)

## 2017-01-17 SURGERY — A/V FISTULAGRAM
Anesthesia: LOCAL

## 2017-01-17 MED ORDER — ACETAMINOPHEN 325 MG RE SUPP: 325.0000 mg | RECTAL | Status: DC | PRN

## 2017-01-17 MED ORDER — HEPARIN (PORCINE) IN NACL 2-0.9 UNIT/ML-% IJ SOLN
INTRAMUSCULAR | Status: AC
  Filled 2017-01-17: qty 500

## 2017-01-17 MED ORDER — ACETAMINOPHEN 325 MG PO TABS: 325.0000 mg | ORAL_TABLET | ORAL | Status: DC | PRN

## 2017-01-17 MED ORDER — HEPARIN (PORCINE) IN NACL 2-0.9 UNIT/ML-% IJ SOLN
INTRAMUSCULAR | Status: AC | PRN
  Administered 2017-01-17: 500 mL

## 2017-01-17 MED ORDER — LABETALOL HCL 5 MG/ML IV SOLN: 10.0000 mg | INTRAVENOUS | Status: DC | PRN

## 2017-01-17 MED ORDER — ONDANSETRON HCL 4 MG/2ML IJ SOLN: 4.0000 mg | Freq: Four times a day (QID) | INTRAMUSCULAR | Status: DC | PRN

## 2017-01-17 MED ORDER — LIDOCAINE HCL (PF) 1 % IJ SOLN
INTRAMUSCULAR | Status: DC | PRN
  Administered 2017-01-17: 5 mL

## 2017-01-17 MED ORDER — SODIUM CHLORIDE 0.9% FLUSH: 3.0000 mL | INTRAVENOUS | Status: DC | PRN

## 2017-01-17 MED ORDER — LIDOCAINE HCL (PF) 1 % IJ SOLN
INTRAMUSCULAR | Status: AC
  Filled 2017-01-17: qty 30

## 2017-01-17 MED ORDER — METOPROLOL TARTRATE 5 MG/5ML IV SOLN: 2.0000 mg | INTRAVENOUS | Status: DC | PRN

## 2017-01-17 MED ORDER — HYDRALAZINE HCL 20 MG/ML IJ SOLN: 5.0000 mg | INTRAMUSCULAR | Status: DC | PRN

## 2017-01-17 MED ORDER — IODIXANOL 320 MG/ML IV SOLN
INTRAVENOUS | Status: DC | PRN
  Administered 2017-01-17: 70 mL via INTRAVENOUS

## 2017-01-17 SURGICAL SUPPLY — 12 items
BAG SNAP BAND KOVER 36X36 (MISCELLANEOUS) ×2 IMPLANT
COVER DOME SNAP 22 D (MISCELLANEOUS) ×2 IMPLANT
COVER PRB 48X5XTLSCP FOLD TPE (BAG) ×1 IMPLANT
COVER PROBE 5X48 (BAG) ×1
GUIDEWIRE ANGLED .035X150CM (WIRE) ×2 IMPLANT
KIT MICROINTRODUCER STIFF 5F (SHEATH) ×2 IMPLANT
PROTECTION STATION PRESSURIZED (MISCELLANEOUS) ×2
STATION PROTECTION PRESSURIZED (MISCELLANEOUS) ×1 IMPLANT
STOPCOCK MORSE 400PSI 3WAY (MISCELLANEOUS) ×2 IMPLANT
TRAY PV CATH (CUSTOM PROCEDURE TRAY) ×2 IMPLANT
TUBING CIL FLEX 10 FLL-RA (TUBING) ×2 IMPLANT
WIRE TORQFLEX AUST .018X40CM (WIRE) ×4 IMPLANT

## 2017-01-17 NOTE — Op Note (Signed)
Procedure: Left arm fistulogram  Preoperative diagnosis: Non-maturing AV fistula left arm  Postoperative diagnosis: Same  Anesthesia: Local  Operative findings: #1 diffusely narrowed left radiocephalic AV fistula with widely patent non-narrowed left central veins and left upper arm cephalic vein  Operative details: After informed consent, the patient was taken to the [facility] lab. The patient was placed on the Angio table in supine position. Patient's left upper extremity was prepped and draped in usual sterile fashion just below the antecubital crease. Ultrasound was used to identify the cephalic vein at this location. A micropuncture needle was used to cannulate the fistula. This took a few attempts due to the small size of the fistula and the inability to advance the guidewire. I was able to finally get enough purchase on the micropuncture wire to slip a micropuncture sheath over this into the fistula. Contrast angiogram was performed confirming within the lumen. An 035 angled Glidewire was advanced down to the area near the wrist. The micropuncture sheath was advanced over this. Contrast angiogram was performed which showed diffuse narrowing of the cephalic vein all the way from the anastomosis to the artery throughout the entire forearm. We then proceeded to inject contrast in the upper arm. The cephalic vein above the antecubital crease is widely patent all the way through the central veins and of good quality. At this point I decided not to intervene on the cephalic vein in the existing radiocephalic AV fistula because of the multiple segment narrowing and the fact that the vein was diffusely diseased and probably not salvageable or durable. At this point the micropuncture sheath was removed and hemostasis was obtained with direct pressure.  Operative management: The patient will be scheduled in the near future for a left brachiocephalic AV fistula and ligation of his radiocephalic AV  fistula.  Ruta Hinds, MD Vascular and Vein Specialists of [place] Office: [phone] Pager: [phone]

## 2017-01-17 NOTE — Discharge Instructions (Signed)

## 2017-01-17 NOTE — Interval H&P Note (Signed)
History and Physical Interval Note:  01/17/2017 8:46 AM  Blake Ina Sr.  has presented today for surgery, with the diagnosis of instage renal  The various methods of treatment have been discussed with the patient and family. After consideration of risks, benefits and other options for treatment, the patient has consented to  Procedure(s): A/V Fistulagram - Left arm (N/A) as a surgical intervention .  The patient's history has been reviewed, patient examined, no change in status, stable for surgery.  I have reviewed the patient's chart and labs.  Questions were answered to the patient's satisfaction.     Ruta Hinds

## 2017-01-17 NOTE — H&P (View-Only) (Signed)
Vascular and Vein Specialist of [place]  Patient name: Blake Billy Sr. MRN: [id number] DOB: [birth year] Sex: male   REASON FOR VISIT:    Follow up fistula  HISOTRY OF PRESENT ILLNESS:    Blake Vester Sr. is a 66 y.o. male who is s/p left radio-cephalic fistula creation by Dr. Kellie Simmering on 09-27-2015.  He is now on hemodialysis Tuesday Thursday Saturday through a catheter.  They're unable to use his fistula.   PAST MEDICAL HISTORY:   Past Medical History:  Diagnosis Date  . Chronic kidney disease (CKD), stage III (moderate)    sees dr Florene Glen nephrology every 6-9 months  . CVA (cerebral vascular accident) [facility]) 12/2013   admitted in July 2015 for acute right-sided infarct/notes 12/02/2014  . Hypercholesterolemia   . Hypertension   . Multiple wounds    on lower legs - sees [name] Pain at the [facility]  . OSA on CPAP    uses cpap, setting of 5  . 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 Systolic CHF ([place])   . Type II diabetes mellitus ([place])    uncontrolled/notes 12/02/2014     FAMILY HISTORY:   Family History  Problem Relation Age of Onset  . Diabetes Mother   . Hypertension Mother   . Diabetes Father   . Hypertension Father   . Asthma Son   . Diabetes Sister   . Diabetes Brother     SOCIAL HISTORY:   Social History  Substance Use Topics  . Smoking status: Former Smoker    Packs/day: 0.25    Years: 2.00    Types: Cigarettes    Quit date: 06/17/1970  . Smokeless tobacco: Never Used  . Alcohol use No     ALLERGIES:   No Known Allergies   CURRENT MEDICATIONS:   Current Outpatient Prescriptions  Medication Sig Dispense Refill  . aspirin 325 MG tablet Take 325 mg by mouth daily at 12 noon.     Marland Kitchen atorvastatin (LIPITOR) 80 MG tablet Take 1 tablet (80 mg total) by mouth daily at 6 PM. 90 tablet 0  . b complex-vitamin c-folic acid  (NEPHRO-VITE) 0.8 MG TABS tablet Take 1 tablet by mouth at bedtime.    . clopidogrel (PLAVIX) 75 MG tablet TAKE 1 TABLET BY MOUTH EVERY DAY WITH BREAKFAST 90 tablet 1  . escitalopram (LEXAPRO) 10 MG tablet Take 1 tablet by mouth daily in evening 90 tablet 1  . isosorbide mononitrate (IMDUR) 120 MG 24 hr tablet TAKE 1 TABLET BY MOUTH DAILY. 90 tablet 0  . Lanthanum Carbonate (FOSRENOL PO) Take by mouth 3 (three) times daily.     No current facility-administered medications for this visit.     REVIEW OF SYSTEMS:   [X]  denotes positive finding, [ ]  denotes negative finding Cardiac  Comments:  Chest pain or chest pressure:    Shortness of breath upon exertion:    Short of breath when lying flat:    Irregular heart rhythm:        Vascular    Pain in calf, thigh, or hip brought on by ambulation:    Pain in feet at night that wakes you up from your sleep:     Blood clot in your veins:    Leg swelling:         Pulmonary    Oxygen at home:    Productive cough:     Wheezing:  Neurologic    Sudden weakness in arms or legs:     Sudden numbness in arms or legs:     Sudden onset of difficulty speaking or slurred speech:    Temporary loss of vision in one eye:     Problems with dizziness:         Gastrointestinal    Blood in stool:     Vomited blood:         Genitourinary    Burning when urinating:     Blood in urine:        Psychiatric    Major depression:         Hematologic    Bleeding problems:    Problems with blood clotting too easily:        Skin    Rashes or ulcers:        Constitutional    Fever or chills:      PHYSICAL EXAM:   There were no vitals filed for this visit.  GENERAL: The patient is a well-nourished male, in no acute distress. The vital signs are documented above. CARDIAC: There is a regular rate and rhythm.  VASCULAR: Palpable thrill in the left radiocephalic fistula albeit faint PULMONARY: Non-labored respirations MUSCULOSKELETAL: There  are no major deformities or cyanosis. NEUROLOGIC: No focal weakness or paresthesias are detected. SKIN: There are no ulcers or rashes noted. PSYCHIATRIC: The patient has a normal affect.  STUDIES:   I have ordered and reviewed his fistula study.  This shows a vein that measures 0.32-0.37 cm from the wrist to the mid forearm.  In the mid forearm to the antecubital crease it measures 0.6 or greater.    MEDICAL ISSUES:   Non-maturing left radiocephalic fistula.  I discussed with the patient and family is to proceed with a fistulogram.  I told him that we would access the fistula near the antecubital crease and injected dye down towards the wrist to evaluate the fistula at this level if the vein is narrowed, angioplasty would be performed at that time.  If this vein appears to be sclerotic, if the cephalic vein looks healthy on fistulogram, he could be scheduled for a brachiocephalic fistula at that time.  If the entire cephalic vein does not appear to be suitable, he will need to be brought back for vein mapping and further discussions for surgery.  He is on dialysis Tuesday Thursday Saturday.  This will be scheduled for a nondialysis day.  Annamarie Major, MD Vascular and Vein Specialists of [facility] [phone] Pager [phone]

## 2017-01-20 ENCOUNTER — Other Ambulatory Visit: Payer: Self-pay

## 2017-01-28 ENCOUNTER — Encounter (HOSPITAL_COMMUNITY): Payer: Self-pay | Admitting: *Deleted

## 2017-01-28 NOTE — Progress Notes (Signed)
I spoke  to [name] and [name], [place].  Patient was at dialysis. Patient's son reported that the thought patient was still taking Plavix.  I called [name] with Dr Oneida Alar office and informed her.  [name] sent a message to Dr Oneida Alar. [name] called and reported that patient is still taking Plavix, I instructed her to not give in to him in am.  [name] checks his CBG and his wife is not sure how they run. I instructed [name] to have her husband t to check CBG after awaking and every 2 hours until arrival  to the hospital.  I Instructed patient if CBG is less than 70 to drink 1/2 cup of Sprite (all they have), recheck CBG in 15 minutes then call pre- op desk at [phone] for further instructions. [name] read the information back to me correctly.

## 2017-01-29 ENCOUNTER — Ambulatory Visit (HOSPITAL_COMMUNITY): Payer: Medicare Other

## 2017-01-29 ENCOUNTER — Encounter (HOSPITAL_COMMUNITY)
Admission: RE | Disposition: A | Discharge status: Home / Self Care | Payer: Self-pay | Source: Ambulatory Visit | Attending: Emergency Medicine

## 2017-01-29 ENCOUNTER — Encounter (HOSPITAL_COMMUNITY): Payer: Self-pay | Admitting: Anesthesiology

## 2017-01-29 ENCOUNTER — Emergency Department (HOSPITAL_COMMUNITY)
Admission: RE | Admit: 2017-01-29 | Discharge: 2017-01-29 | Disposition: A | Discharge status: Home / Self Care | Payer: Medicare Other | Source: Ambulatory Visit | Attending: Emergency Medicine | Admitting: Emergency Medicine

## 2017-01-29 ENCOUNTER — Encounter (HOSPITAL_COMMUNITY): Payer: Self-pay

## 2017-01-29 DIAGNOSIS — Z992 Dependence on renal dialysis: Secondary | ICD-10-CM | POA: Diagnosis not present

## 2017-01-29 DIAGNOSIS — N186 End stage renal disease: Secondary | ICD-10-CM | POA: Diagnosis not present

## 2017-01-29 DIAGNOSIS — I132 Hypertensive heart and chronic kidney disease with heart failure and with stage 5 chronic kidney disease, or end stage renal disease: Secondary | ICD-10-CM | POA: Insufficient documentation

## 2017-01-29 DIAGNOSIS — R064 Hyperventilation: Secondary | ICD-10-CM

## 2017-01-29 DIAGNOSIS — I5042 Chronic combined systolic (congestive) and diastolic (congestive) heart failure: Secondary | ICD-10-CM | POA: Diagnosis not present

## 2017-01-29 DIAGNOSIS — Z7902 Long term (current) use of antithrombotics/antiplatelets: Secondary | ICD-10-CM | POA: Diagnosis not present

## 2017-01-29 DIAGNOSIS — E1122 Type 2 diabetes mellitus with diabetic chronic kidney disease: Secondary | ICD-10-CM | POA: Diagnosis not present

## 2017-01-29 DIAGNOSIS — Z7982 Long term (current) use of aspirin: Secondary | ICD-10-CM | POA: Diagnosis not present

## 2017-01-29 DIAGNOSIS — Z87891 Personal history of nicotine dependence: Secondary | ICD-10-CM | POA: Insufficient documentation

## 2017-01-29 DIAGNOSIS — Z8673 Personal history of transient ischemic attack (TIA), and cerebral infarction without residual deficits: Secondary | ICD-10-CM | POA: Diagnosis not present

## 2017-01-29 DIAGNOSIS — R0602 Shortness of breath: Secondary | ICD-10-CM

## 2017-01-29 DIAGNOSIS — Z79899 Other long term (current) drug therapy: Secondary | ICD-10-CM | POA: Insufficient documentation

## 2017-01-29 HISTORY — PX: LIGATION OF ARTERIOVENOUS  FISTULA: SHX5948

## 2017-01-29 HISTORY — DX: End stage renal disease: N18.6

## 2017-01-29 HISTORY — PX: AV FISTULA PLACEMENT: SHX1204

## 2017-01-29 LAB — CBC WITH DIFFERENTIAL/PLATELET
BASOS PCT: 1 %
Basophils Absolute: 0.1 10*3/uL (ref 0.0–0.1)
Eosinophils Absolute: 0.1 10*3/uL (ref 0.0–0.7)
Eosinophils Relative: 1 %
HEMATOCRIT: 38.1 % — AB (ref 39.0–52.0)
HEMOGLOBIN: 12.6 g/dL — AB (ref 13.0–17.0)
LYMPHS PCT: 24 %
Lymphs Abs: 2.2 10*3/uL (ref 0.7–4.0)
MCH: 31.9 pg (ref 26.0–34.0)
MCHC: 33.1 g/dL (ref 30.0–36.0)
MCV: 96.5 fL (ref 78.0–100.0)
MONO ABS: 0.8 10*3/uL (ref 0.1–1.0)
Monocytes Relative: 9 %
NEUTROS ABS: 6.2 10*3/uL (ref 1.7–7.7)
NEUTROS PCT: 65 %
Platelets: 371 10*3/uL (ref 150–400)
RBC: 3.95 MIL/uL — AB (ref 4.22–5.81)
RDW: 17.4 % — ABNORMAL HIGH (ref 11.5–15.5)
WBC: 9.5 10*3/uL (ref 4.0–10.5)

## 2017-01-29 LAB — BLOOD GAS, ARTERIAL
ACID-BASE EXCESS: 3 mmol/L — AB (ref 0.0–2.0)
Bicarbonate: 25.4 mmol/L (ref 20.0–28.0)
DRAWN BY: 27022
O2 Content: 2 L/min
O2 SAT: 98.1 %
PCO2 ART: 27.9 mmHg — AB (ref 32.0–48.0)
PH ART: 7.566 — AB (ref 7.350–7.450)
Patient temperature: 98
pO2, Arterial: 99.4 mmHg (ref 83.0–108.0)

## 2017-01-29 LAB — I-STAT ARTERIAL BLOOD GAS, ED
ACID-BASE EXCESS: 5 mmol/L — AB (ref 0.0–2.0)
Acid-Base Excess: 4 mmol/L — ABNORMAL HIGH (ref 0.0–2.0)
BICARBONATE: 28.1 mmol/L — AB (ref 20.0–28.0)
Bicarbonate: 24.6 mmol/L (ref 20.0–28.0)
O2 SAT: 95 %
O2 Saturation: 99 %
PH ART: 7.629 — AB (ref 7.350–7.450)
PH ART: 7.478 — AB (ref 7.350–7.450)
PO2 ART: 118 mmHg — AB (ref 83.0–108.0)
PO2 ART: 69 mmHg — AB (ref 83.0–108.0)
TCO2: 25 mmol/L (ref 0–100)
TCO2: 29 mmol/L (ref 0–100)
pCO2 arterial: 23.5 mmHg — ABNORMAL LOW (ref 32.0–48.0)
pCO2 arterial: 38 mmHg (ref 32.0–48.0)

## 2017-01-29 LAB — POCT I-STAT 4, (NA,K, GLUC, HGB,HCT)
Glucose, Bld: 158 mg/dL — ABNORMAL HIGH (ref 65–99)
HCT: 38 % — ABNORMAL LOW (ref 39.0–52.0)
HEMOGLOBIN: 12.9 g/dL — AB (ref 13.0–17.0)
POTASSIUM: 4.2 mmol/L (ref 3.5–5.1)
SODIUM: 140 mmol/L (ref 135–145)

## 2017-01-29 LAB — GLUCOSE, CAPILLARY: GLUCOSE-CAPILLARY: 115 mg/dL — AB (ref 65–99)

## 2017-01-29 LAB — TROPONIN I: Troponin I: 0.04 ng/mL (ref <0.03)

## 2017-01-29 LAB — SURGICAL PCR SCREEN
MRSA, PCR: NEGATIVE
Staphylococcus aureus: POSITIVE — AB

## 2017-01-29 SURGERY — ARTERIOVENOUS (AV) FISTULA CREATION
Anesthesia: Choice | Laterality: Left

## 2017-01-29 MED ORDER — SODIUM CHLORIDE 0.9 % IV SOLN: INTRAVENOUS | Status: DC

## 2017-01-29 MED ORDER — CHLORHEXIDINE GLUCONATE 4 % EX LIQD: 60.0000 mL | Freq: Once | CUTANEOUS | Status: DC

## 2017-01-29 MED ORDER — DEXTROSE 5 % IV SOLN
1.5000 g | INTRAVENOUS | Status: DC
  Filled 2017-01-29: qty 1.5

## 2017-01-29 MED ORDER — PROPOFOL 10 MG/ML IV BOLUS
INTRAVENOUS | Status: AC
  Filled 2017-01-29: qty 20

## 2017-01-29 MED ORDER — SODIUM CHLORIDE 0.9 % IV SOLN
INTRAVENOUS | Status: DC
  Administered 2017-01-29: 11:00:00 via INTRAVENOUS

## 2017-01-29 MED ORDER — LIDOCAINE 2% (20 MG/ML) 5 ML SYRINGE
INTRAMUSCULAR | Status: AC
  Filled 2017-01-29: qty 5

## 2017-01-29 MED ORDER — FENTANYL CITRATE (PF) 250 MCG/5ML IJ SOLN
INTRAMUSCULAR | Status: AC
  Filled 2017-01-29: qty 5

## 2017-01-29 MED ORDER — MIDAZOLAM HCL 2 MG/2ML IJ SOLN
INTRAMUSCULAR | Status: AC
  Filled 2017-01-29: qty 2

## 2017-01-29 MED ORDER — MUPIROCIN 2 % EX OINT
1.0000 "application " | TOPICAL_OINTMENT | Freq: Once | CUTANEOUS | Status: AC
  Administered 2017-01-29: 1 via TOPICAL
  Filled 2017-01-29: qty 22

## 2017-01-29 SURGICAL SUPPLY — 31 items
ARMBAND PINK RESTRICT EXTREMIT (MISCELLANEOUS) ×4 IMPLANT
CANISTER SUCT 3000ML PPV (MISCELLANEOUS) ×2 IMPLANT
CANNULA VESSEL 3MM 2 BLNT TIP (CANNULA) ×2 IMPLANT
CLIP VESOCCLUDE MED 6/CT (CLIP) ×2 IMPLANT
CLIP VESOCCLUDE SM WIDE 6/CT (CLIP) ×2 IMPLANT
COVER PROBE W GEL 5X96 (DRAPES) IMPLANT
DECANTER SPIKE VIAL GLASS SM (MISCELLANEOUS) ×2 IMPLANT
DERMABOND ADVANCED (GAUZE/BANDAGES/DRESSINGS) ×1
DERMABOND ADVANCED .7 DNX12 (GAUZE/BANDAGES/DRESSINGS) ×1 IMPLANT
DRAIN PENROSE 1/4X12 LTX STRL (WOUND CARE) ×2 IMPLANT
ELECT REM PT RETURN 9FT ADLT (ELECTROSURGICAL) ×2
ELECTRODE REM PT RTRN 9FT ADLT (ELECTROSURGICAL) ×1 IMPLANT
GLOVE BIO SURGEON STRL SZ7.5 (GLOVE) ×2 IMPLANT
GOWN STRL REUS W/ TWL LRG LVL3 (GOWN DISPOSABLE) ×3 IMPLANT
GOWN STRL REUS W/TWL LRG LVL3 (GOWN DISPOSABLE) ×3
HEMOSTAT SPONGE AVITENE ULTRA (HEMOSTASIS) IMPLANT
KIT BASIN OR (CUSTOM PROCEDURE TRAY) ×2 IMPLANT
KIT ROOM TURNOVER OR (KITS) ×2 IMPLANT
LOOP VESSEL MINI RED (MISCELLANEOUS) IMPLANT
NS IRRIG 1000ML POUR BTL (IV SOLUTION) ×2 IMPLANT
PACK CV ACCESS (CUSTOM PROCEDURE TRAY) ×2 IMPLANT
PAD ARMBOARD 7.5X6 YLW CONV (MISCELLANEOUS) ×4 IMPLANT
SPONGE SURGIFOAM ABS GEL 100 (HEMOSTASIS) IMPLANT
SUT PROLENE 6 0 CC (SUTURE) IMPLANT
SUT PROLENE 7 0 BV 1 (SUTURE) ×2 IMPLANT
SUT SILK 0 (SUTURE) IMPLANT
SUT VIC AB 3-0 SH 27 (SUTURE) ×1
SUT VIC AB 3-0 SH 27X BRD (SUTURE) ×1 IMPLANT
SUT VICRYL 4-0 PS2 18IN ABS (SUTURE) ×2 IMPLANT
UNDERPAD 30X30 (UNDERPADS AND DIAPERS) ×2 IMPLANT
WATER STERILE IRR 1000ML POUR (IV SOLUTION) ×2 IMPLANT

## 2017-01-29 NOTE — ED Notes (Signed)
Patient is breathing much better, BiPap removed.

## 2017-01-29 NOTE — Progress Notes (Signed)
Patient's son notified nurse that patient was "looking different".   On assessment, patient presents as confused, short of breath, generalized weakness, difficulty speaking.  Vitals obtained, patient placed on 2L nasal canula.  Rapid response nurse called.  Dr. Oneida Alar notified and canceled case and patient being sent to ED.  Son present at bedside.

## 2017-01-29 NOTE — ED Notes (Signed)
Patient is feeling much better, states he is feeling better appears much calmer. resp rate is slower.

## 2017-01-29 NOTE — ED Notes (Signed)
Dr Zenia Resides notified of troponin result by RN whitney

## 2017-01-29 NOTE — ED Triage Notes (Signed)
Patient was brought to ED from Short Stay  States he was up there for a revision of fistula, son states approx. 1205 patient became very restless and was c/o being tired. Increased resp rate 44 RR RN was called and patient had ABG and CXR. Patient completed dialysis yest.

## 2017-01-29 NOTE — H&P (View-Only) (Signed)
Vascular and Vein Specialist of [place]  Patient name: Blake Heidelberg Sr. MRN: [id number] DOB: 08-20-1950 Sex: male   REASON FOR VISIT:    Follow up fistula  HISOTRY OF PRESENT ILLNESS:    Blake Rathert Sr. is a 66 y.o. male who is s/p left radio-cephalic fistula creation by Dr. Kellie Simmering on 09-27-2015.  He is now on hemodialysis Tuesday Thursday Saturday through a catheter.  They're unable to use his fistula.   PAST MEDICAL HISTORY:   Past Medical History:  Diagnosis Date  . Chronic kidney disease (CKD), stage III (moderate)    sees dr Florene Glen nephrology every 6-9 months  . CVA (cerebral vascular accident) [facility]) 12/2013   admitted in July 2015 for acute right-sided infarct/notes 12/02/2014  . Hypercholesterolemia   . Hypertension   . Multiple wounds    on lower legs - sees [name] Pain at the [place]  . OSA on CPAP    uses cpap, setting of 5  . 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 Stroke ([place]) 12/02/2014   expressive aphasia  . Systolic CHF ([place])   . Type II diabetes mellitus ([place])    uncontrolled/notes 12/02/2014     FAMILY HISTORY:   Family History  Problem Relation Age of Onset  . Diabetes Mother   . Hypertension Mother   . Diabetes Father   . Hypertension Father   . Asthma Son   . Diabetes Sister   . Diabetes Brother     SOCIAL HISTORY:   Social History  Substance Use Topics  . Smoking status: Former Smoker    Packs/day: 0.25    Years: 2.00    Types: Cigarettes    Quit date: 06/17/1970  . Smokeless tobacco: Never Used  . Alcohol use No     ALLERGIES:   No Known Allergies   CURRENT MEDICATIONS:   Current Outpatient Prescriptions  Medication Sig Dispense Refill  . aspirin 325 MG tablet Take 325 mg by mouth daily at 12 noon.     Marland Kitchen atorvastatin (LIPITOR) 80 MG tablet Take 1 tablet (80 mg total) by mouth daily at 6 PM. 90 tablet 0  . b complex-vitamin c-folic acid  (NEPHRO-VITE) 0.8 MG TABS tablet Take 1 tablet by mouth at bedtime.    . clopidogrel (PLAVIX) 75 MG tablet TAKE 1 TABLET BY MOUTH EVERY DAY WITH BREAKFAST 90 tablet 1  . escitalopram (LEXAPRO) 10 MG tablet Take 1 tablet by mouth daily in evening 90 tablet 1  . isosorbide mononitrate (IMDUR) 120 MG 24 hr tablet TAKE 1 TABLET BY MOUTH DAILY. 90 tablet 0  . Lanthanum Carbonate (FOSRENOL PO) Take by mouth 3 (three) times daily.     No current facility-administered medications for this visit.     REVIEW OF SYSTEMS:   [X]  denotes positive finding, [ ]  denotes negative finding Cardiac  Comments:  Chest pain or chest pressure:    Shortness of breath upon exertion:    Short of breath when lying flat:    Irregular heart rhythm:        Vascular    Pain in calf, thigh, or hip brought on by ambulation:    Pain in feet at night that wakes you up from your sleep:     Blood clot in your veins:    Leg swelling:         Pulmonary    Oxygen at home:    Productive cough:     Wheezing:  Neurologic    Sudden weakness in arms or legs:     Sudden numbness in arms or legs:     Sudden onset of difficulty speaking or slurred speech:    Temporary loss of vision in one eye:     Problems with dizziness:         Gastrointestinal    Blood in stool:     Vomited blood:         Genitourinary    Burning when urinating:     Blood in urine:        Psychiatric    Major depression:         Hematologic    Bleeding problems:    Problems with blood clotting too easily:        Skin    Rashes or ulcers:        Constitutional    Fever or chills:      PHYSICAL EXAM:   There were no vitals filed for this visit.  GENERAL: The patient is a well-nourished male, in no acute distress. The vital signs are documented above. CARDIAC: There is a regular rate and rhythm.  VASCULAR: Palpable thrill in the left radiocephalic fistula albeit faint PULMONARY: Non-labored respirations MUSCULOSKELETAL: There  are no major deformities or cyanosis. NEUROLOGIC: No focal weakness or paresthesias are detected. SKIN: There are no ulcers or rashes noted. PSYCHIATRIC: The patient has a normal affect.  STUDIES:   I have ordered and reviewed his fistula study.  This shows a vein that measures 0.32-0.37 cm from the wrist to the mid forearm.  In the mid forearm to the antecubital crease it measures 0.6 or greater.    MEDICAL ISSUES:   Non-maturing left radiocephalic fistula.  I discussed with the patient and family is to proceed with a fistulogram.  I told him that we would access the fistula near the antecubital crease and injected dye down towards the wrist to evaluate the fistula at this level if the vein is narrowed, angioplasty would be performed at that time.  If this vein appears to be sclerotic, if the cephalic vein looks healthy on fistulogram, he could be scheduled for a brachiocephalic fistula at that time.  If the entire cephalic vein does not appear to be suitable, he will need to be brought back for vein mapping and further discussions for surgery.  He is on dialysis Tuesday Thursday Saturday.  This will be scheduled for a nondialysis day.  Annamarie Major, MD Vascular and Vein Specialists of [facility] [phone] Pager [phone]

## 2017-01-29 NOTE — Significant Event (Signed)
Rapid Response Event Note  Overview:  RRT page for a change in mental status and respiratory distress Time Called: 1225 Arrival Time: 1228 Event Type: Respiratory, Neurologic  Initial Focused Assessment:  RRT page to short stay for patient with change in mental status and respiratory distress.  On my arrival to patients bedside, RN and son at bedside.  As per son At 1205 patient became very restless and agitated, RN states he appears confused, notes generalized weakness and difficulty speaking.  As per son, he has a history of stroke, with deficits of slowed slurred speech, left side weakness and occasional confusion.     Interventions:  On my assessment, noted above deficits of slowed speech, oriented x 3, move all extremities and following commands.  Skin is warm and dry, patient is lethargic with head bobbing, mild accessory muscle use 120/77, HR 90, RR 36-42, 100% on nasal cannula.  Dr Ermalene Postin at bedside, [facility] and ABG ordered.  Dr. Oneida Alar updated by Short stay RN.  Patient placed on BIPAP due to increased respiratory rate, and accessory muscle use.  Patient also stating he is getting tired.  Plan of Care (if not transferred):  Patient transferred to ED with RRT and RN, report given to [facility] in Ed.  Patient transported on [place].  Son present on transport  Event Summary:   at      at          Kennedy Bucker

## 2017-01-29 NOTE — Interval H&P Note (Signed)
History and Physical Interval Note:  01/29/2017 11:31 AM  Blake Ina Sr.  has presented today for surgery, with the diagnosis of end stage renal disease  The various methods of treatment have been discussed with the patient and family. After consideration of risks, benefits and other options for treatment, the patient has consented to  Procedure(s): ARTERIOVENOUS (AV) FISTULA CREATION BRACHIOCEPHALIC (Left) LIGATION OF RADIOCEPHALIC ARTERIOVENOUS  FISTULA (Left) as a surgical intervention .  The patient's history has been reviewed, patient examined, no change in status, stable for surgery.  I have reviewed the patient's chart and labs.  Questions were answered to the patient's satisfaction.     Ruta Hinds

## 2017-01-29 NOTE — Progress Notes (Signed)
Dr. Oneida Alar made aware that pt took Plavix 75mg  yesterday and Aspirin 325mg  pta.

## 2017-01-29 NOTE — ED Provider Notes (Signed)
[facility] DEPT Provider Note   CSN: [id number] Arrival date & time: 01/29/17  1321     History   Chief Complaint Chief Complaint  Patient presents with  . Shortness of Breath    HPI Blake Rhine Sr. is a 66 y.o. male.  66 year old male presents after having acute onset of dyspnea which began in short stay. Patient had a history of renal failure and was scheduled for shunt revision today. Last dialysis was yesterday. Was at his baseline state of health prior to coming to the hospital today. He became acutely short of breath and very anxious and restless. Chest x-ray performed without acute findings. Blood gas shows a respiratory alkalosis without evidence of hypoxemia. He denied having any chest pain or chest pressure. Patient also had a hemoglobin which was 13. Glucose was 158. Potassium was 4.2. Was placed on BiPAP and transferred here.      Past Medical History:  Diagnosis Date  . CVA (cerebral vascular accident) [facility]) 12/2013   admitted in July 2015 for acute right-sided infarct/notes 12/02/2014- 01/28/17-  uses walker   . ESRD (end stage renal disease) ([place])    Hemo TTHSat  . Hypercholesterolemia   . Hypertension   . Multiple wounds    on lower legs - sees [name] Pain at the [facility]  . OSA on CPAP    uses cpap, setting of 5, 01/28/17- does not use any longer  . 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 Stroke ([place]) 12/02/2014   expressive aphasia 01/28/17- sometimes  . Systolic CHF ([place])   . Type II diabetes mellitus ([place])    uncontrolled/notes 12/02/2014    Patient Active Problem List   Diagnosis Date Noted  . Expressive aphasia 09/06/2016  . Weakness following cerebrovascular accident (CVA) 09/06/2016  . Poor tolerance for ambulation 09/06/2016  . Chronic kidney disease (CKD), stage IV (severe) ([place]) 09/12/2015  . Chronic diastolic heart failure, NYHA class 2 ([place]) 08/08/2015  . Anemia, iron deficiency 06/04/2015  . Acute on  chronic systolic (congestive) heart failure ([place]) 05/29/2015  . Systolic CHF ([place]) 93/81/8299  . Uncontrolled diabetes with stage 4 chronic kidney disease GFR 15-29 ([place]) 12/28/2014  . Type II diabetes mellitus with neurological manifestations ([place]) 12/12/2014  . Acute ischemic left MCA stroke ([place]) 12/02/2014  . Type 2 diabetes mellitus with complication ([place]) 37/16/9678  . Essential hypertension, benign 03/24/2014  . HLD (hyperlipidemia) 03/24/2014  . CVA (cerebral infarction) 01/13/2014  . Hypernatremia 07/03/2013  . Morbid obesity ([place]) 07/03/2013  . Venous stasis ulcers ([place]) 07/03/2013  . DM (diabetes mellitus) type 2, uncontrolled, with ketoacidosis ([place]) 07/03/2013  . 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 Sellar or suprasellar mass 06/22/2013  . Acute respiratory failure ([place]) 06/22/2013  . Seizure ([place]) 06/21/2013  . Altered mental status 06/21/2013  . Hypercarbia 06/21/2013  . OSA (obstructive sleep apnea) 05/25/2013    Past Surgical History:  Procedure Laterality Date  . A/V FISTULAGRAM N/A 01/17/2017   Procedure: A/V Fistulagram - Left arm;  Surgeon: Elam Dutch, MD;  Location: [facility];  Service: Cardiovascular;  Laterality: N/A;  . AV FISTULA PLACEMENT Left 09/27/2015   Procedure: RADIOCEPHALIC ARTERIOVENOUS (AV) FISTULA CREATION LEFT ARM;  Surgeon: Mal Misty, MD;  Location: [facility];  Service: Vascular;  Laterality: Left;  . COLONOSCOPY  6-7 yrs ago  . COLONOSCOPY N/A 04/05/2013   Procedure: COLONOSCOPY;  Surgeon: Juanita Craver, MD;  Location: [facility];  Service: Endoscopy;  Laterality: N/A;  Home Medications    Prior to Admission medications   Medication Sig Start Date End Date Taking? Authorizing Provider  aspirin 325 MG tablet Take 325 mg by mouth every evening.    Yes [provider]  atorvastatin (LIPITOR) 80 MG tablet TAKE 1 TABLET AT 6PM. 01/13/17  Yes Shawnee Knapp, MD  clopidogrel (PLAVIX) 75 MG tablet TAKE 1 TABLET BY MOUTH  EVERY DAY WITH BREAKFAST 09/28/16  Yes Shawnee Knapp, MD  escitalopram (LEXAPRO) 10 MG tablet Take 1 tablet by mouth daily in evening 11/05/16  Yes Shawnee Knapp, MD  isosorbide mononitrate (IMDUR) 120 MG 24 hr tablet TAKE 1 TABLET BY MOUTH DAILY. 01/02/17  Yes Shawnee Knapp, MD  lanthanum (FOSRENOL) 1000 MG chewable tablet Chew 1 tablet by mouth 3 (three) times daily.    Yes [provider]  multivitamin (RENA-VIT) TABS tablet Take 1 tablet by mouth daily.   Yes [provider]    Family History Family History  Problem Relation Age of Onset  . Diabetes Mother   . Hypertension Mother   . Diabetes Father   . Hypertension Father   . Asthma Son   . Diabetes Sister   . Diabetes Brother     Social History Social History  Substance Use Topics  . Smoking status: Former Smoker    Packs/day: 0.25    Years: 2.00    Types: Cigarettes    Quit date: 06/17/1970  . Smokeless tobacco: Never Used  . Alcohol use No     Allergies   Patient has no known allergies.   Review of Systems Review of Systems  All other systems reviewed and are negative.    Physical Exam Updated Vital Signs BP (!) 131/115   Pulse 94   Temp 97.7 F (36.5 C) (Axillary)   Resp (!) 27   Ht 1.702 m (5\' 7" )   Wt (!) 136.5 kg (301 lb)   SpO2 100%   BMI 47.14 kg/m   Physical Exam  Constitutional: He is oriented to person, place, and time. He appears well-developed and well-nourished.  Non-toxic appearance. No distress.  HENT:  Head: Normocephalic and atraumatic.  Eyes: Pupils are equal, round, and reactive to light. Conjunctivae, EOM and lids are normal.  Neck: Normal range of motion. Neck supple. No tracheal deviation present. No thyroid mass present.  Cardiovascular: Normal rate, regular rhythm and normal heart sounds.  Exam reveals no gallop.   No murmur heard. Pulmonary/Chest: Effort normal and breath sounds normal. No stridor. No respiratory distress. He has no decreased breath sounds. He has  no wheezes. He has no rhonchi. He has no rales.  Abdominal: Soft. Normal appearance and bowel sounds are normal. He exhibits no distension. There is no tenderness. There is no rebound and no CVA tenderness.  Musculoskeletal: Normal range of motion. He exhibits no edema or tenderness.  Neurological: He is alert and oriented to person, place, and time. He has normal strength. No cranial nerve deficit or sensory deficit. GCS eye subscore is 4. GCS verbal subscore is 5. GCS motor subscore is 6.  Speech is dysarthric but this is his baseline from prior stroke.  Skin: Skin is warm and dry. No abrasion and no rash noted.  Psychiatric: He has a normal mood and affect. His speech is normal and behavior is normal.  Nursing note and vitals reviewed.    ED Treatments / Results  Labs (all labs ordered are listed, but only abnormal results are displayed) Labs Reviewed  SURGICAL PCR SCREEN - Abnormal; Notable for the following:       Result Value   Staphylococcus aureus POSITIVE (*)    All other components within normal limits  BLOOD GAS, ARTERIAL - Abnormal; Notable for the following:    pH, Arterial 7.566 (*)    pCO2 arterial 27.9 (*)    Acid-Base Excess 3.0 (*)    All other components within normal limits  GLUCOSE, CAPILLARY - Abnormal; Notable for the following:    Glucose-Capillary 115 (*)    All other components within normal limits  POCT I-STAT 4, (NA,K, GLUC, HGB,HCT) - Abnormal; Notable for the following:    Glucose, Bld 158 (*)    HCT 38.0 (*)    Hemoglobin 12.9 (*)    All other components within normal limits  I-STAT ARTERIAL BLOOD GAS, ED - Abnormal; Notable for the following:    pH, Arterial 7.629 (*)    pCO2 arterial 23.5 (*)    pO2, Arterial 118.0 (*)    Acid-Base Excess 5.0 (*)    All other components within normal limits  CBC WITH DIFFERENTIAL/PLATELET  TROPONIN I  BLOOD GAS, ARTERIAL    EKG  EKG Interpretation None       Radiology Dg Chest Port 1 View  Result  Date: 01/29/2017 CLINICAL DATA:  Confusion.  Shortness of breath. EXAM: PORTABLE CHEST 1 VIEW COMPARISON:  05/18/2015. FINDINGS: Right IJ dialysis catheter noted. Tip is over the superior vena cava. Mild cardiomegaly. No pulmonary venous congestion. Low lung volumes. No pleural effusion or pneumothorax. IMPRESSION: Right IJ dialysis catheter with tip over superior vena cava. No pneumothorax. 2. Mild cardiomegaly.  No pulmonary venous congestion. 3. Low lung volumes with mild basilar atelectasis . Electronically Signed   By: Marcello Moores  Register   On: 01/29/2017 12:57    Procedures Procedures (including critical care time)  Medications Ordered in ED Medications  0.9 %  sodium chloride infusion (not administered)  mupirocin ointment (BACTROBAN) 2 % 1 application (1 application Topical Given 01/29/17 1046)     Initial Impression / Assessment and Plan / ED Course  I have reviewed the triage vital signs and the nursing notes.  Pertinent labs & imaging results that were available during my care of the patient were reviewed by me and considered in my medical decision making (see chart for details).     Patients repeat blood gas being off of BiPAP after an hour is back to baseline. Troponin is persistently elevated. He is as baseline according to his son. Suspect that patient had a hyperventilation episode. Will be discharged to home.  Final Clinical Impressions(s) / ED Diagnoses   Final diagnoses:  SOB (shortness of breath)    New Prescriptions New Prescriptions   No medications on file     Lacretia Leigh, MD 01/29/17 1737

## 2017-01-29 NOTE — Progress Notes (Signed)
Pt placed on Bipap during rapid response call & transferred to the ED. Pt remained stable throughout transport. Handed off report to the ED RT.

## 2017-01-30 ENCOUNTER — Encounter (HOSPITAL_COMMUNITY): Payer: Self-pay | Admitting: Vascular Surgery

## 2017-02-14 ENCOUNTER — Other Ambulatory Visit: Payer: Self-pay | Admitting: *Deleted

## 2017-02-18 ENCOUNTER — Encounter (HOSPITAL_COMMUNITY): Payer: Self-pay | Admitting: *Deleted

## 2017-02-18 NOTE — Progress Notes (Addendum)
Spoke with pt's son, Will Winfred, [place] for pt for pre-op call. He states pt does not have a cardiac history and denies any complaints of chest pain or sob. Pt is diabetic. Does not check his blood sugar at home per pt's son. Will states that pt's last dose of Plavix was 02/16/17.

## 2017-02-18 NOTE — Progress Notes (Signed)
Called pt's son to notify him of surgery time change and the arrival time of 6:30 AM. He immediately states that there is no way pt can be there at time because he is arriving by prearranged transportation by [facility]. I tried calling office but they were closed. I called and spoke with Caren Griffins in the OR and she told me to have pt arrive as originally planned (10 AM) and to put "ASAP" on his chart to alert [facility] to get pt ready quickly. She states she will call the surgeon on the call and notify him.

## 2017-02-19 ENCOUNTER — Encounter (HOSPITAL_COMMUNITY): Payer: Self-pay | Admitting: Certified Registered Nurse Anesthetist

## 2017-02-19 ENCOUNTER — Ambulatory Visit (HOSPITAL_COMMUNITY): Admission: RE | Admit: 2017-02-19 | Payer: Medicare Other | Source: Ambulatory Visit | Admitting: Vascular Surgery

## 2017-02-19 SURGERY — ARTERIOVENOUS (AV) FISTULA CREATION
Anesthesia: Monitor Anesthesia Care | Laterality: Left

## 2017-02-20 ENCOUNTER — Encounter (HOSPITAL_COMMUNITY): Payer: Self-pay

## 2017-02-20 ENCOUNTER — Observation Stay (HOSPITAL_COMMUNITY): Payer: Medicare (Managed Care)

## 2017-02-20 ENCOUNTER — Other Ambulatory Visit: Payer: Self-pay

## 2017-02-20 ENCOUNTER — Inpatient Hospital Stay (HOSPITAL_COMMUNITY)
Admission: EM | Admit: 2017-02-20 | Discharge: 2017-02-23 | DRG: 064 | Disposition: A | Discharge status: Home / Self Care | Payer: Medicare (Managed Care) | Attending: Internal Medicine | Admitting: Internal Medicine

## 2017-02-20 ENCOUNTER — Emergency Department (HOSPITAL_COMMUNITY): Payer: Medicare (Managed Care)

## 2017-02-20 DIAGNOSIS — R4701 Aphasia: Secondary | ICD-10-CM

## 2017-02-20 DIAGNOSIS — E1122 Type 2 diabetes mellitus with diabetic chronic kidney disease: Secondary | ICD-10-CM | POA: Diagnosis present

## 2017-02-20 DIAGNOSIS — I69354 Hemiplegia and hemiparesis following cerebral infarction affecting left non-dominant side: Secondary | ICD-10-CM

## 2017-02-20 DIAGNOSIS — Z833 Family history of diabetes mellitus: Secondary | ICD-10-CM

## 2017-02-20 DIAGNOSIS — I639 Cerebral infarction, unspecified: Secondary | ICD-10-CM | POA: Diagnosis present

## 2017-02-20 DIAGNOSIS — I10 Essential (primary) hypertension: Secondary | ICD-10-CM | POA: Diagnosis present

## 2017-02-20 DIAGNOSIS — N184 Chronic kidney disease, stage 4 (severe): Secondary | ICD-10-CM | POA: Diagnosis present

## 2017-02-20 DIAGNOSIS — G9341 Metabolic encephalopathy: Secondary | ICD-10-CM | POA: Diagnosis present

## 2017-02-20 DIAGNOSIS — G459 Transient cerebral ischemic attack, unspecified: Secondary | ICD-10-CM | POA: Diagnosis not present

## 2017-02-20 DIAGNOSIS — L8990 Pressure ulcer of unspecified site, unspecified stage: Secondary | ICD-10-CM | POA: Insufficient documentation

## 2017-02-20 DIAGNOSIS — R4182 Altered mental status, unspecified: Secondary | ICD-10-CM | POA: Diagnosis not present

## 2017-02-20 DIAGNOSIS — Z7902 Long term (current) use of antithrombotics/antiplatelets: Secondary | ICD-10-CM

## 2017-02-20 DIAGNOSIS — N2581 Secondary hyperparathyroidism of renal origin: Secondary | ICD-10-CM | POA: Diagnosis present

## 2017-02-20 DIAGNOSIS — I5020 Unspecified systolic (congestive) heart failure: Secondary | ICD-10-CM | POA: Diagnosis present

## 2017-02-20 DIAGNOSIS — I132 Hypertensive heart and chronic kidney disease with heart failure and with stage 5 chronic kidney disease, or end stage renal disease: Secondary | ICD-10-CM | POA: Diagnosis present

## 2017-02-20 DIAGNOSIS — Z992 Dependence on renal dialysis: Secondary | ICD-10-CM

## 2017-02-20 DIAGNOSIS — N186 End stage renal disease: Secondary | ICD-10-CM | POA: Diagnosis present

## 2017-02-20 DIAGNOSIS — I69320 Aphasia following cerebral infarction: Secondary | ICD-10-CM

## 2017-02-20 DIAGNOSIS — R4789 Other speech disturbances: Secondary | ICD-10-CM

## 2017-02-20 DIAGNOSIS — E876 Hypokalemia: Secondary | ICD-10-CM | POA: Diagnosis present

## 2017-02-20 DIAGNOSIS — I63512 Cerebral infarction due to unspecified occlusion or stenosis of left middle cerebral artery: Secondary | ICD-10-CM | POA: Diagnosis not present

## 2017-02-20 DIAGNOSIS — Z6841 Body Mass Index (BMI) 40.0 and over, adult: Secondary | ICD-10-CM

## 2017-02-20 DIAGNOSIS — I953 Hypotension of hemodialysis: Secondary | ICD-10-CM | POA: Diagnosis not present

## 2017-02-20 DIAGNOSIS — I672 Cerebral atherosclerosis: Secondary | ICD-10-CM | POA: Diagnosis present

## 2017-02-20 DIAGNOSIS — Z87891 Personal history of nicotine dependence: Secondary | ICD-10-CM

## 2017-02-20 DIAGNOSIS — G4733 Obstructive sleep apnea (adult) (pediatric): Secondary | ICD-10-CM | POA: Diagnosis present

## 2017-02-20 DIAGNOSIS — D649 Anemia, unspecified: Secondary | ICD-10-CM | POA: Diagnosis present

## 2017-02-20 DIAGNOSIS — E785 Hyperlipidemia, unspecified: Secondary | ICD-10-CM | POA: Diagnosis present

## 2017-02-20 DIAGNOSIS — R29703 NIHSS score 3: Secondary | ICD-10-CM | POA: Diagnosis present

## 2017-02-20 DIAGNOSIS — Z7982 Long term (current) use of aspirin: Secondary | ICD-10-CM

## 2017-02-20 DIAGNOSIS — Z8249 Family history of ischemic heart disease and other diseases of the circulatory system: Secondary | ICD-10-CM

## 2017-02-20 LAB — COMPREHENSIVE METABOLIC PANEL
ALBUMIN: 3 g/dL — AB (ref 3.5–5.0)
ALT: 17 U/L (ref 17–63)
ANION GAP: 10 (ref 5–15)
AST: 17 U/L (ref 15–41)
Alkaline Phosphatase: 70 U/L (ref 38–126)
BUN: 25 mg/dL — ABNORMAL HIGH (ref 6–20)
CHLORIDE: 101 mmol/L (ref 101–111)
CO2: 26 mmol/L (ref 22–32)
Calcium: 8 mg/dL — ABNORMAL LOW (ref 8.9–10.3)
Creatinine, Ser: 3.76 mg/dL — ABNORMAL HIGH (ref 0.61–1.24)
GFR calc non Af Amer: 15 mL/min — ABNORMAL LOW (ref >60)
GFR, EST AFRICAN AMERICAN: 18 mL/min — AB (ref >60)
Glucose, Bld: 177 mg/dL — ABNORMAL HIGH (ref 65–99)
POTASSIUM: 2.8 mmol/L — AB (ref 3.5–5.1)
SODIUM: 137 mmol/L (ref 135–145)
Total Bilirubin: 0.3 mg/dL (ref 0.3–1.2)
Total Protein: 6.9 g/dL (ref 6.5–8.1)

## 2017-02-20 LAB — CBC
HCT: 33.8 % — ABNORMAL LOW (ref 39.0–52.0)
Hemoglobin: 11 g/dL — ABNORMAL LOW (ref 13.0–17.0)
MCH: 30.9 pg (ref 26.0–34.0)
MCHC: 32.5 g/dL (ref 30.0–36.0)
MCV: 94.9 fL (ref 78.0–100.0)
Platelets: 289 K/uL (ref 150–400)
RBC: 3.56 MIL/uL — ABNORMAL LOW (ref 4.22–5.81)
RDW: 16 % — ABNORMAL HIGH (ref 11.5–15.5)
WBC: 11.3 K/uL — ABNORMAL HIGH (ref 4.0–10.5)

## 2017-02-20 LAB — APTT: aPTT: 58 seconds — ABNORMAL HIGH (ref 24–36)

## 2017-02-20 LAB — I-STAT CHEM 8, ED
BUN: 26 mg/dL — ABNORMAL HIGH (ref 6–20)
CREATININE: 3.9 mg/dL — AB (ref 0.61–1.24)
Calcium, Ion: 0.94 mmol/L — ABNORMAL LOW (ref 1.15–1.40)
Chloride: 98 mmol/L — ABNORMAL LOW (ref 101–111)
GLUCOSE: 179 mg/dL — AB (ref 65–99)
HCT: 36 % — ABNORMAL LOW (ref 39.0–52.0)
HEMOGLOBIN: 12.2 g/dL — AB (ref 13.0–17.0)
POTASSIUM: 2.9 mmol/L — AB (ref 3.5–5.1)
Sodium: 139 mmol/L (ref 135–145)
TCO2: 26 mmol/L (ref 22–32)

## 2017-02-20 LAB — VITAMIN B12: Vitamin B-12: 456 pg/mL (ref 180–914)

## 2017-02-20 LAB — DIFFERENTIAL
Basophils Absolute: 0.1 K/uL (ref 0.0–0.1)
Basophils Relative: 1 %
Eosinophils Absolute: 0.3 K/uL (ref 0.0–0.7)
Eosinophils Relative: 3 %
Lymphocytes Relative: 28 %
Lymphs Abs: 3.2 K/uL (ref 0.7–4.0)
Monocytes Absolute: 0.7 K/uL (ref 0.1–1.0)
Monocytes Relative: 7 %
Neutro Abs: 7 K/uL (ref 1.7–7.7)
Neutrophils Relative %: 61 %

## 2017-02-20 LAB — TSH: TSH: 2.189 u[IU]/mL (ref 0.350–4.500)

## 2017-02-20 LAB — PROTIME-INR
INR: 1.1
PROTHROMBIN TIME: 14.1 s (ref 11.4–15.2)

## 2017-02-20 LAB — I-STAT TROPONIN, ED: Troponin i, poc: 0.03 ng/mL (ref 0.00–0.08)

## 2017-02-20 LAB — CBG MONITORING, ED: Glucose-Capillary: 147 mg/dL — ABNORMAL HIGH (ref 65–99)

## 2017-02-20 LAB — AMMONIA: Ammonia: 27 umol/L (ref 9–35)

## 2017-02-20 MED ORDER — POTASSIUM CHLORIDE 20 MEQ PO PACK
20.0000 meq | PACK | Freq: Once | ORAL | Status: AC
  Administered 2017-02-21: 20 meq via ORAL
  Filled 2017-02-20 (×2): qty 1

## 2017-02-20 MED ORDER — LORAZEPAM 1 MG PO TABS
1.0000 mg | ORAL_TABLET | Freq: Once | ORAL | Status: AC
  Administered 2017-02-20: 1 mg via ORAL
  Filled 2017-02-20: qty 1

## 2017-02-20 MED ORDER — ASPIRIN 325 MG PO TABS
325.0000 mg | ORAL_TABLET | Freq: Every evening | ORAL | Status: DC
Start: 2017-02-20 — End: 2017-02-23
  Administered 2017-02-20 – 2017-02-22 (×3): 325 mg via ORAL
  Filled 2017-02-20 (×3): qty 1

## 2017-02-20 MED ORDER — ATORVASTATIN CALCIUM 80 MG PO TABS
80.0000 mg | ORAL_TABLET | Freq: Every day | ORAL | Status: DC
  Administered 2017-02-21 – 2017-02-22 (×2): 80 mg via ORAL
  Filled 2017-02-20 (×2): qty 1

## 2017-02-20 MED ORDER — IOPAMIDOL (ISOVUE-370) INJECTION 76%
INTRAVENOUS | Status: AC
  Administered 2017-02-20: 100 mL
  Filled 2017-02-20: qty 100

## 2017-02-20 MED ORDER — STROKE: EARLY STAGES OF RECOVERY BOOK
Freq: Once | Status: AC
  Administered 2017-02-20: 23:00:00
  Filled 2017-02-20: qty 1

## 2017-02-20 MED ORDER — SENNOSIDES-DOCUSATE SODIUM 8.6-50 MG PO TABS
1.0000 | ORAL_TABLET | Freq: Every evening | ORAL | Status: DC | PRN
Start: 2017-02-20 — End: 2017-02-23
  Administered 2017-02-22: 1 via ORAL
  Filled 2017-02-20: qty 1

## 2017-02-20 MED ORDER — ESCITALOPRAM OXALATE 10 MG PO TABS
10.0000 mg | ORAL_TABLET | Freq: Every day | ORAL | Status: DC
  Administered 2017-02-20 – 2017-02-22 (×3): 10 mg via ORAL
  Filled 2017-02-20 (×3): qty 1

## 2017-02-20 MED ORDER — RENA-VITE PO TABS
1.0000 | ORAL_TABLET | Freq: Every day | ORAL | Status: DC
  Administered 2017-02-21 – 2017-02-23 (×2): 1 via ORAL
  Filled 2017-02-20 (×2): qty 1

## 2017-02-20 MED ORDER — CLOPIDOGREL BISULFATE 75 MG PO TABS
75.0000 mg | ORAL_TABLET | Freq: Every day | ORAL | Status: DC
  Administered 2017-02-21 – 2017-02-23 (×3): 75 mg via ORAL
  Filled 2017-02-20 (×3): qty 1

## 2017-02-20 MED ORDER — HEPARIN SODIUM (PORCINE) 5000 UNIT/ML IJ SOLN
5000.0000 [IU] | Freq: Three times a day (TID) | INTRAMUSCULAR | Status: DC
  Administered 2017-02-20 – 2017-02-23 (×8): 5000 [IU] via SUBCUTANEOUS
  Filled 2017-02-20 (×9): qty 1

## 2017-02-20 MED ORDER — LANTHANUM CARBONATE 500 MG PO CHEW
1000.0000 mg | CHEWABLE_TABLET | Freq: Three times a day (TID) | ORAL | Status: DC
  Administered 2017-02-21 – 2017-02-23 (×6): 1000 mg via ORAL
  Filled 2017-02-20 (×10): qty 2

## 2017-02-20 NOTE — ED Notes (Signed)
Patient transported to MRI 

## 2017-02-20 NOTE — ED Provider Notes (Signed)
[facility] DEPT Provider Note   CSN: [id number] Arrival date & time: 02/20/17  1720     History   Chief Complaint Chief Complaint  Patient presents with  . Code Stroke    HPI Blake Jacome Sr. is a 66 y.o. male.  The history is provided by the patient and medical records. The history is limited by the condition of the patient. No language interpreter was used.  Neurologic Problem  This is a new problem. The current episode started 1 to 2 hours ago. The problem occurs constantly. The problem has not changed since onset.Pertinent negatives include no chest pain, no abdominal pain, no headaches and no shortness of breath. Nothing aggravates the symptoms. Nothing relieves the symptoms. He has tried nothing for the symptoms. The treatment provided no relief.    Past Medical History:  Diagnosis Date  . CVA (cerebral vascular accident) [facility]) 12/2013   admitted in July 2015 for acute right-sided infarct/notes 12/02/2014- 01/28/17-  uses walker   . ESRD (end stage renal disease) ([place])    Hemo TTHSat  . Hypercholesterolemia   . Hypertension   . Multiple wounds    on lower legs - sees [name] Pain at the [facility]  . OSA on CPAP    uses cpap, setting of 5, 01/28/17- does not use any longer  . 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 Stroke ([place]) 12/02/2014   expressive aphasia 01/28/17- sometimes  . Systolic CHF ([place])   . Type II diabetes mellitus ([place])    uncontrolled/notes 12/02/2014    Patient Active Problem List   Diagnosis Date Noted  . Expressive aphasia 09/06/2016  . Weakness following cerebrovascular accident (CVA) 09/06/2016  . Poor tolerance for ambulation 09/06/2016  . Chronic kidney disease (CKD), stage IV (severe) ([place]) 09/12/2015  . Chronic diastolic heart failure, NYHA class 2 ([place]) 08/08/2015  . Anemia, iron deficiency 06/04/2015  . Acute on chronic systolic (congestive) heart failure ([place]) 05/29/2015  . Systolic CHF ([place])  12/11/9483  . Uncontrolled diabetes with stage 4 chronic kidney disease GFR 15-29 ([place]) 12/28/2014  . Type II diabetes mellitus with neurological manifestations ([place]) 12/12/2014  . Acute ischemic left MCA stroke ([place]) 12/02/2014  . Type 2 diabetes mellitus with complication ([place]) 46/27/0350  . Essential hypertension, benign 03/24/2014  . HLD (hyperlipidemia) 03/24/2014  . CVA (cerebral infarction) 01/13/2014  . Hypernatremia 07/03/2013  . Morbid obesity ([place]) 07/03/2013  . Venous stasis ulcers ([place]) 07/03/2013  . DM (diabetes mellitus) type 2, uncontrolled, with ketoacidosis ([place]) 07/03/2013  . 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 Sellar or suprasellar mass 06/22/2013  . Acute respiratory failure ([place]) 06/22/2013  . Seizure ([place]) 06/21/2013  . Altered mental status 06/21/2013  . Hypercarbia 06/21/2013  . OSA (obstructive sleep apnea) 05/25/2013    Past Surgical History:  Procedure Laterality Date  . A/V FISTULAGRAM N/A 01/17/2017   Procedure: A/V Fistulagram - Left arm;  Surgeon: Elam Dutch, MD;  Location: [facility];  Service: Cardiovascular;  Laterality: N/A;  . AV FISTULA PLACEMENT Left 09/27/2015   Procedure: RADIOCEPHALIC ARTERIOVENOUS (AV) FISTULA CREATION LEFT ARM;  Surgeon: Mal Misty, MD;  Location: [facility];  Service: Vascular;  Laterality: Left;  . AV FISTULA PLACEMENT Left 01/29/2017   Procedure: ARTERIOVENOUS (AV) FISTULA CREATION BRACHIOCEPHALIC;  Surgeon: Elam Dutch, MD;  Location: [facility];  Service: Vascular;  Laterality: Left;  . COLONOSCOPY  6-7 yrs ago  . COLONOSCOPY N/A 04/05/2013   Procedure: COLONOSCOPY;  Surgeon: Juanita Craver, MD;  Location: [facility];  Service: Endoscopy;  Laterality: N/A;  . LIGATION OF ARTERIOVENOUS  FISTULA Left 01/29/2017   Procedure: LIGATION OF RADIOCEPHALIC ARTERIOVENOUS  FISTULA;  Surgeon: Elam Dutch, MD;  Location: [facility];  Service: Vascular;  Laterality: Left;       Home Medications    Prior to  Admission medications   Medication Sig Start Date End Date Taking? Authorizing Provider  aspirin 325 MG tablet Take 325 mg by mouth every evening.     [provider]  atorvastatin (LIPITOR) 80 MG tablet TAKE 1 TABLET AT 6PM. 01/13/17   Shawnee Knapp, MD  clopidogrel (PLAVIX) 75 MG tablet TAKE 1 TABLET BY MOUTH EVERY DAY WITH BREAKFAST 09/28/16   Shawnee Knapp, MD  escitalopram (LEXAPRO) 10 MG tablet Take 1 tablet by mouth daily in evening Patient taking differently: Take 10 mg by mouth at bedtime.  11/05/16   Shawnee Knapp, MD  isosorbide mononitrate (IMDUR) 120 MG 24 hr tablet TAKE 1 TABLET BY MOUTH DAILY. 01/02/17   Shawnee Knapp, MD  lanthanum (FOSRENOL) 1000 MG chewable tablet Chew 1 tablet by mouth 3 (three) times daily.     [provider]  multivitamin (RENA-VIT) TABS tablet Take 1 tablet by mouth daily.    [provider]    Family History Family History  Problem Relation Age of Onset  . Diabetes Mother   . Hypertension Mother   . Diabetes Father   . Hypertension Father   . Asthma Son   . Diabetes Sister   . Diabetes Brother     Social History Social History  Substance Use Topics  . Smoking status: Former Smoker    Packs/day: 0.25    Years: 2.00    Types: Cigarettes    Quit date: 06/17/1970  . Smokeless tobacco: Never Used  . Alcohol use No     Allergies   Patient has no known allergies.   Review of Systems Review of Systems  Constitutional: Negative for chills and fever.  HENT: Negative for congestion.   Eyes: Negative for visual disturbance.  Respiratory: Negative for cough, chest tightness, shortness of breath and wheezing.   Cardiovascular: Negative for chest pain and palpitations.  Gastrointestinal: Negative for abdominal pain, constipation, diarrhea, nausea and vomiting.  Genitourinary: Negative for flank pain.  Musculoskeletal: Negative for back pain, neck pain and neck stiffness.  Skin: Negative for rash and wound.  Neurological:  Positive for speech difficulty. Negative for dizziness, seizures, syncope, weakness, light-headedness and headaches.  Psychiatric/Behavioral: Positive for confusion. Negative for agitation.  All other systems reviewed and are negative.    Physical Exam Updated Vital Signs There were no vitals taken for this visit.  Physical Exam  Constitutional: He appears well-developed and well-nourished. No distress.  HENT:  Head: Normocephalic and atraumatic.  Nose: Nose normal.  Mouth/Throat: Oropharynx is clear and moist. No oropharyngeal exudate.  Eyes: Pupils are equal, round, and reactive to light. Conjunctivae and EOM are normal.  Neck: Normal range of motion.  Cardiovascular: Normal rate and intact distal pulses.   No murmur heard. Pulmonary/Chest: Effort normal. No stridor. No respiratory distress. He has no wheezes. He has no rales. He exhibits no tenderness.  Abdominal: Soft. There is no tenderness.  Musculoskeletal: He exhibits no tenderness.  Neurological: He is alert. He is disoriented. He displays no tremor. No cranial nerve deficit or sensory deficit. He exhibits normal muscle tone. Coordination normal. GCS eye subscore is 4. GCS  verbal subscore is 5. GCS motor subscore is 6.  Patient has word finding difficulty and stuttering. Patient also has some difficulty getting out what he wants to say. Patient has no other focal neurologic deficits on my exam. Patient is disoriented to time.   Skin: Capillary refill takes less than 2 seconds. He is not diaphoretic. No erythema. No pallor.  Nursing note and vitals reviewed.    ED Treatments / Results  Labs (all labs ordered are listed, but only abnormal results are displayed) Labs Reviewed  APTT - Abnormal; Notable for the following:       Result Value   aPTT 58 (*)    All other components within normal limits  CBC - Abnormal; Notable for the following:    WBC 11.3 (*)    RBC 3.56 (*)    Hemoglobin 11.0 (*)    HCT 33.8 (*)    RDW  16.0 (*)    All other components within normal limits  COMPREHENSIVE METABOLIC PANEL - Abnormal; Notable for the following:    Potassium 2.8 (*)    Glucose, Bld 177 (*)    BUN 25 (*)    Creatinine, Ser 3.76 (*)    Calcium 8.0 (*)    Albumin 3.0 (*)    GFR calc non Af Amer 15 (*)    GFR calc Af Amer 18 (*)    All other components within normal limits  CBG MONITORING, ED - Abnormal; Notable for the following:    Glucose-Capillary 147 (*)    All other components within normal limits  I-STAT CHEM 8, ED - Abnormal; Notable for the following:    Potassium 2.9 (*)    Chloride 98 (*)    BUN 26 (*)    Creatinine, Ser 3.90 (*)    Glucose, Bld 179 (*)    Calcium, Ion 0.94 (*)    Hemoglobin 12.2 (*)    HCT 36.0 (*)    All other components within normal limits  PROTIME-INR  DIFFERENTIAL  HEMOGLOBIN A1C  LIPID PANEL  TSH  RPR  VITAMIN B12  AMMONIA  I-STAT TROPONIN, ED    EKG  EKG Interpretation None      ED ECG REPORT   Date: 02/20/2017  Rate: 84  Rhythm: normal sinus rhythm and premature ventricular contractions (PVC)  QRS Axis: normal  Intervals: QT prolonged  ST/T Wave abnormalities: nonspecific ST changes  Conduction Disutrbances:none  Narrative Interpretation:   Old EKG Reviewed: none available  I have personally reviewed the EKG tracing and agree with the computerized printout as noted.    Radiology Ct Angio Head W Or Wo Contrast  Result Date: 02/20/2017 CLINICAL DATA:  Initial evaluation for acute speech difficulty, altered mental status. EXAM: CT ANGIOGRAPHY HEAD AND NECK CT PERFUSION BRAIN TECHNIQUE: Multidetector CT imaging of the head and neck was performed using the standard protocol during bolus administration of intravenous contrast. Multiplanar CT image reconstructions and MIPs were obtained to evaluate the vascular anatomy. Carotid stenosis measurements (when applicable) are obtained utilizing NASCET criteria, using the distal internal carotid diameter as  the denominator. Multiphase CT imaging of the brain was performed following IV bolus contrast injection. Subsequent parametric perfusion maps were calculated using RAPID software. CONTRAST:  100 cc of Isovue 370. COMPARISON:  Prior noncontrast head CT from earlier the same day. FINDINGS: CTA NECK FINDINGS Aortic arch: Visualized aortic arch of normal caliber with normal branch pattern. Atheromatous plaque within the arch itself. No flow-limiting stenosis about the origin of the  great vessels. Visualized subclavian artery is widely patent. Right carotid system: Right carotid artery system mildly tortuous but patent without significant stenosis, dissection, or occlusion. Calcified plaque about the right carotid bifurcation without flow-limiting stenosis. Left carotid system: Left common carotid artery system tortuous but widely patent at the skullbase without stenosis, dissection, or occlusion. Atheromatous plaque about the left carotid bifurcation without flow-limiting stenosis. Vertebral arteries: Both of the vertebral arteries arise from the subclavian arteries. Right vertebral artery dominant. Multifocal atheromatous irregularity within the right vertebral artery without flow-limiting stenosis. Left vertebral artery diffusely hypoplastic. Vertebral artery's patent to the skullbase without stenosis or occlusion. Skeleton: No acute osseous abnormality. No worrisome lytic or blastic osseous lesions. Moderate degenerate spondylolysis at C3-4 through C6-7. Other neck: No acute soft tissue abnormality within the neck. No adenopathy. Right-sided central venous catheter noted. Upper chest: No acute abnormality. Review of the MIP images confirms the above findings CTA HEAD FINDINGS Anterior circulation: Petrous segments patent bilaterally without stenosis. Calcified atheromatous plaque within the cavernous/supraclinoid ICAs without high-grade stenosis. ICA termini widely patent. A1 segments patent bilaterally. Anterior  cerebral arteries patent to their distal aspects without stenosis. M1 segments patent without occlusion. Focal moderate proximal right M1 stenosis noted (series 13, image 95). No proximal M2 occlusion. Distal MCA branches well opacified and symmetric. Posterior circulation: Scattered plaque within the dominant right vertebral artery without flow-limiting stenosis. Hypoplastic left vertebral artery patent as well. Posterior inferior cerebral arteries patent bilaterally. Basilar artery tortuous but patent to its distal aspect without flow-limiting stenosis. Superior cerebral arteries patent bilaterally. Fetal type origin of the right PCA supplied via a patent right posterior communicating artery. Left PCA patent to its distal aspect without flow-limiting stenosis. Venous sinuses: Not well evaluated due to arterial timing of the contrast bolus. Anatomic variants: Fetal type right PCA. No aneurysm or vascular malformation. Delayed phase: Not performed. Review of the MIP images confirms the above findings CT Brain Perfusion Findings: CBF (<30%) Volume: CRLmL Perfusion (Tmax>6.0s) volume: 47mL Mismatch Volume: 0 roundmL Infarction Location:No acute infarct. IMPRESSION: 1. Negative CT perfusion.  No acute infarct. 2. Negative CTA for emergent large vessel occlusion. 3. Scattered atheromatous disease involving the major arterial vasculature of the head and neck as above. Most notable finding consists of 80 focal moderate proximal right M1 stenosis. No other high-grade or correctable stenosis identified. 4. Diffuse vessel tortuosity, suggesting chronic underlying hypertension. Results were paged at the time of interpretation on 02/20/2017 at 6:12 pm to Dr. Amie Portland who immediately called back, results were conveyed via telephone. Electronically Signed   By: Jeannine Boga M.D.   On: 02/20/2017 18:27   Ct Angio Neck W Or Wo Contrast  Result Date: 02/20/2017 CLINICAL DATA:  Initial evaluation for acute speech  difficulty, altered mental status. EXAM: CT ANGIOGRAPHY HEAD AND NECK CT PERFUSION BRAIN TECHNIQUE: Multidetector CT imaging of the head and neck was performed using the standard protocol during bolus administration of intravenous contrast. Multiplanar CT image reconstructions and MIPs were obtained to evaluate the vascular anatomy. Carotid stenosis measurements (when applicable) are obtained utilizing NASCET criteria, using the distal internal carotid diameter as the denominator. Multiphase CT imaging of the brain was performed following IV bolus contrast injection. Subsequent parametric perfusion maps were calculated using RAPID software. CONTRAST:  100 cc of Isovue 370. COMPARISON:  Prior noncontrast head CT from earlier the same day. FINDINGS: CTA NECK FINDINGS Aortic arch: Visualized aortic arch of normal caliber with normal branch pattern. Atheromatous plaque within the arch itself. No  flow-limiting stenosis about the origin of the great vessels. Visualized subclavian artery is widely patent. Right carotid system: Right carotid artery system mildly tortuous but patent without significant stenosis, dissection, or occlusion. Calcified plaque about the right carotid bifurcation without flow-limiting stenosis. Left carotid system: Left common carotid artery system tortuous but widely patent at the skullbase without stenosis, dissection, or occlusion. Atheromatous plaque about the left carotid bifurcation without flow-limiting stenosis. Vertebral arteries: Both of the vertebral arteries arise from the subclavian arteries. Right vertebral artery dominant. Multifocal atheromatous irregularity within the right vertebral artery without flow-limiting stenosis. Left vertebral artery diffusely hypoplastic. Vertebral artery's patent to the skullbase without stenosis or occlusion. Skeleton: No acute osseous abnormality. No worrisome lytic or blastic osseous lesions. Moderate degenerate spondylolysis at C3-4 through C6-7.  Other neck: No acute soft tissue abnormality within the neck. No adenopathy. Right-sided central venous catheter noted. Upper chest: No acute abnormality. Review of the MIP images confirms the above findings CTA HEAD FINDINGS Anterior circulation: Petrous segments patent bilaterally without stenosis. Calcified atheromatous plaque within the cavernous/supraclinoid ICAs without high-grade stenosis. ICA termini widely patent. A1 segments patent bilaterally. Anterior cerebral arteries patent to their distal aspects without stenosis. M1 segments patent without occlusion. Focal moderate proximal right M1 stenosis noted (series 13, image 95). No proximal M2 occlusion. Distal MCA branches well opacified and symmetric. Posterior circulation: Scattered plaque within the dominant right vertebral artery without flow-limiting stenosis. Hypoplastic left vertebral artery patent as well. Posterior inferior cerebral arteries patent bilaterally. Basilar artery tortuous but patent to its distal aspect without flow-limiting stenosis. Superior cerebral arteries patent bilaterally. Fetal type origin of the right PCA supplied via a patent right posterior communicating artery. Left PCA patent to its distal aspect without flow-limiting stenosis. Venous sinuses: Not well evaluated due to arterial timing of the contrast bolus. Anatomic variants: Fetal type right PCA. No aneurysm or vascular malformation. Delayed phase: Not performed. Review of the MIP images confirms the above findings CT Brain Perfusion Findings: CBF (<30%) Volume: CRLmL Perfusion (Tmax>6.0s) volume: 61mL Mismatch Volume: 0 roundmL Infarction Location:No acute infarct. IMPRESSION: 1. Negative CT perfusion.  No acute infarct. 2. Negative CTA for emergent large vessel occlusion. 3. Scattered atheromatous disease involving the major arterial vasculature of the head and neck as above. Most notable finding consists of 80 focal moderate proximal right M1 stenosis. No other  high-grade or correctable stenosis identified. 4. Diffuse vessel tortuosity, suggesting chronic underlying hypertension. Results were paged at the time of interpretation on 02/20/2017 at 6:12 pm to Dr. Amie Portland who immediately called back, results were conveyed via telephone. Electronically Signed   By: Jeannine Boga M.D.   On: 02/20/2017 18:27   Ct Cerebral Perfusion W Contrast  Result Date: 02/20/2017 CLINICAL DATA:  Initial evaluation for acute speech difficulty, altered mental status. EXAM: CT ANGIOGRAPHY HEAD AND NECK CT PERFUSION BRAIN TECHNIQUE: Multidetector CT imaging of the head and neck was performed using the standard protocol during bolus administration of intravenous contrast. Multiplanar CT image reconstructions and MIPs were obtained to evaluate the vascular anatomy. Carotid stenosis measurements (when applicable) are obtained utilizing NASCET criteria, using the distal internal carotid diameter as the denominator. Multiphase CT imaging of the brain was performed following IV bolus contrast injection. Subsequent parametric perfusion maps were calculated using RAPID software. CONTRAST:  100 cc of Isovue 370. COMPARISON:  Prior noncontrast head CT from earlier the same day. FINDINGS: CTA NECK FINDINGS Aortic arch: Visualized aortic arch of normal caliber with normal branch pattern. Atheromatous plaque  within the arch itself. No flow-limiting stenosis about the origin of the great vessels. Visualized subclavian artery is widely patent. Right carotid system: Right carotid artery system mildly tortuous but patent without significant stenosis, dissection, or occlusion. Calcified plaque about the right carotid bifurcation without flow-limiting stenosis. Left carotid system: Left common carotid artery system tortuous but widely patent at the skullbase without stenosis, dissection, or occlusion. Atheromatous plaque about the left carotid bifurcation without flow-limiting stenosis. Vertebral  arteries: Both of the vertebral arteries arise from the subclavian arteries. Right vertebral artery dominant. Multifocal atheromatous irregularity within the right vertebral artery without flow-limiting stenosis. Left vertebral artery diffusely hypoplastic. Vertebral artery's patent to the skullbase without stenosis or occlusion. Skeleton: No acute osseous abnormality. No worrisome lytic or blastic osseous lesions. Moderate degenerate spondylolysis at C3-4 through C6-7. Other neck: No acute soft tissue abnormality within the neck. No adenopathy. Right-sided central venous catheter noted. Upper chest: No acute abnormality. Review of the MIP images confirms the above findings CTA HEAD FINDINGS Anterior circulation: Petrous segments patent bilaterally without stenosis. Calcified atheromatous plaque within the cavernous/supraclinoid ICAs without high-grade stenosis. ICA termini widely patent. A1 segments patent bilaterally. Anterior cerebral arteries patent to their distal aspects without stenosis. M1 segments patent without occlusion. Focal moderate proximal right M1 stenosis noted (series 13, image 95). No proximal M2 occlusion. Distal MCA branches well opacified and symmetric. Posterior circulation: Scattered plaque within the dominant right vertebral artery without flow-limiting stenosis. Hypoplastic left vertebral artery patent as well. Posterior inferior cerebral arteries patent bilaterally. Basilar artery tortuous but patent to its distal aspect without flow-limiting stenosis. Superior cerebral arteries patent bilaterally. Fetal type origin of the right PCA supplied via a patent right posterior communicating artery. Left PCA patent to its distal aspect without flow-limiting stenosis. Venous sinuses: Not well evaluated due to arterial timing of the contrast bolus. Anatomic variants: Fetal type right PCA. No aneurysm or vascular malformation. Delayed phase: Not performed. Review of the MIP images confirms the above  findings CT Brain Perfusion Findings: CBF (<30%) Volume: CRLmL Perfusion (Tmax>6.0s) volume: 51mL Mismatch Volume: 0 roundmL Infarction Location:No acute infarct. IMPRESSION: 1. Negative CT perfusion.  No acute infarct. 2. Negative CTA for emergent large vessel occlusion. 3. Scattered atheromatous disease involving the major arterial vasculature of the head and neck as above. Most notable finding consists of 80 focal moderate proximal right M1 stenosis. No other high-grade or correctable stenosis identified. 4. Diffuse vessel tortuosity, suggesting chronic underlying hypertension. Results were paged at the time of interpretation on 02/20/2017 at 6:12 pm to Dr. Amie Portland who immediately called back, results were conveyed via telephone. Electronically Signed   By: Jeannine Boga M.D.   On: 02/20/2017 18:27   Ct Head Code Stroke Wo Contrast  Result Date: 02/20/2017 CLINICAL DATA:  Code stroke. Initial evaluation for acute altered mental status. EXAM: CT HEAD WITHOUT CONTRAST TECHNIQUE: Contiguous axial images were obtained from the base of the skull through the vertex without intravenous contrast. COMPARISON:  Prior MRI from 12/02/2014. FINDINGS: Brain: Generalized age related cerebral atrophy with moderate chronic small vessel ischemic disease. No acute intracranial hemorrhage. No evidence for acute large vessel territory infarct. No mass lesion, midline shift or mass effect. No hydrocephalus. No extra-axial fluid collection. Vascular: No asymmetric hyperdense vessel. Extensive intracranial atherosclerosis. Skull: Scalp soft tissues within normal limits.  Calvarium intact. Sinuses/Orbits: Globes and orbital soft tissues within normal limits. Paranasal sinuses are clear. No mastoid effusion. Other: None. ASPECTS [facility] Stroke Program Early CT Score) - Ganglionic  level infarction (caudate, lentiform nuclei, internal capsule, insula, M1-M3 cortex): 7 - Supraganglionic infarction (M4-M6 cortex): 3 Total score  (0-10 with 10 being normal): 10 IMPRESSION: 1. No acute intracranial infarct or other process identified. 2. ASPECTS is 10 3. Moderate cerebral atrophy with chronic small vessel ischemic disease. Critical Value/emergent results were called by telephone at the time of interpretation on 02/20/2017 at 5:48 pm to Dr. Marda Stalker , who verbally acknowledged these results. Results also paged to Dr. Rory Percy of the Stroke Neurology service via secure page at 5:53 p.m. on 02/20/2017. Electronically Signed   By: Jeannine Boga M.D.   On: 02/20/2017 17:54    Procedures Procedures (including critical care time)  Medications Ordered in ED Medications   stroke: mapping our early stages of recovery book (not administered)  senna-docusate (Senokot-S) tablet 1 tablet (not administered)  heparin injection 5,000 Units (not administered)  aspirin tablet 325 mg (not administered)  atorvastatin (LIPITOR) tablet 80 mg (not administered)  clopidogrel (PLAVIX) tablet 75 mg (not administered)  escitalopram (LEXAPRO) tablet 10 mg (not administered)  multivitamin (RENA-VIT) tablet 1 tablet (not administered)  lanthanum (FOSRENOL) chewable tablet 1,000 mg (not administered)  potassium chloride (KLOR-CON) packet 20 mEq (not administered)  LORazepam (ATIVAN) tablet 1 mg (not administered)  iopamidol (ISOVUE-370) 76 % injection (100 mLs  Contrast Given 02/20/17 1745)     Initial Impression / Assessment and Plan / ED Course  I have reviewed the triage vital signs and the nursing notes.  Pertinent labs & imaging results that were available during my care of the patient were reviewed by me and considered in my medical decision making (see chart for details).     Blake Jeff Sr. is a 66 y.o. male with a past medical history significant for prior strokes, ESRD on dialysis, diabetes, CHF, and hypertension who presents from his dialysis center for altered mental status, speech difficulty, and a code stroke.  Patient was taken emergently to the CT scanner on arrival as he was a code stroke. According to patient and neurology report, patient wasgetting his dialysis today when he had an episode of altered mental status at approximately 4:30 PM. Patient was having word finding difficulties and some aphasia.  Patient denies any preceding symptoms, any headaches, vision changes, nausea, vomiting, pain anywhere, conservation, diarrhea, or any rashes. He denies any chest pain or abdominal pain.   On initial exam, patient has no focal weakness or numbness. Normal extra ocular movement and pupil exam. Lungs were clear and chest and abdomen were nontender. Patient did have some stuttering speech and had a difficult time answering some questions. According to neurology he did have word finding abnormalities. Patient was given an NIH stroke scale of 3 by neurology. Patient's exam was otherwise unremarkable.  Neurology did not feel he was having an acute stroke but do want him admitted to the hospitalist service for MRI and further workup given his history of stroke and his continued speech abnormalities. Hospitalist team will be called for admission.     8:01 PM Patient and family report that he has had severe anxiety in the past getting scans. 1 mg of oral Ativan was ordered to help with anxiety for obtaining the MRI he needs.    Final Clinical Impressions(s) / ED Diagnoses   Final diagnoses:  Aphasia  Word finding difficulty    Clinical Impression: 1. Aphasia   2. Word finding difficulty      Disposition: Admit to Hospitalist service    Tegeler, Harrell Gave  J, MD 02/20/17 2332

## 2017-02-20 NOTE — ED Notes (Signed)
Pt family took belonging with them only left hat and shirt

## 2017-02-20 NOTE — Code Documentation (Signed)
66 y.o. Male with PMHx of DM2, CHF, CVA, OSA, HTN, HLD and ESRD who was stated to be in his normal state of health today prior to his regularly scheduled dialysis appointment. At the completion of his treatment today, the patient reported chest pain. EMS was notified and appreciated word finding difficulties and thus called a code stroke. On arrival to [facility], the patient was met at the bridge by the stroke team, airway was cleared and patient taken to CT. CT revealed no acute intracranial abnormalities. ASPECTS 10. CTA revealed no LVO. CTP negative. NIHSS 4. VAN negative. See EMR for NIHSS and code stroke times. On assessment, the patient had mild aphasia, ,mild dysarthria and was unable to answer the LOC questions. No other focal deficits were noted. IV tPA not given d/t being too mild to treat. Not a thrombectomy candidate d/t no LVO. ED bedside handoff with ED RN Minna Merritts.

## 2017-02-20 NOTE — ED Triage Notes (Signed)
BIB in [facility] from dialysis. Staff at dialysis states that pt had difficulty speaking and AMS axox1. Pt also c/o chest pain and ems gave 324ASA. No chest pain at this time. Pt had all his dialysis.

## 2017-02-20 NOTE — Consult Note (Addendum)
Neurology Consultation  Reason for Consult:   Referring Physician: Dr. Sherry Ruffing  PX:TGGY finding difficulty at dialysis  History is obtained from: Patient and EMS  HPI: Blake Lafosse Sr. is a 66 y.o. male , with a past medical history of stroke in 2015 and 2016,(right putamen/corona radiata and 2015 as well as left putamen/corona radiata and 2016) was residual lower extremity weakness and documented occasional/intermittent aphasia, who uses a walker to walk at baseline, was at dialysis and had sudden onset of word finding difficulty, for which she was brought in as a code stroke to the emergency room for evaluation. He was at dialysis and had sudden onset of altered mental status and word finding difficulty there. It was noted by EMS that his systolic blood pressure were in the lower 100s at best. Initially, he seemed to be completely out of it per the EMS but then his mentation started to improve on their way to the emergency room.  LKW: 1630 on 02/20/2017 tpa given?: no, low NIH stroke scale, low suspicion for stroke Premorbid modified Rankin scale (mRS):  2-Slight disability-UNABLE to perform all activities but does not need assistance   ROS: A 14 point ROS was performed and is negative except as noted in the HPI.    Past Medical History:  Diagnosis Date  . CVA (cerebral vascular accident) [facility]) 12/2013   admitted in July 2015 for acute right-sided infarct/notes 12/02/2014- 01/28/17-  uses walker   . ESRD (end stage renal disease) ([place])    Hemo TTHSat  . Hypercholesterolemia   . Hypertension   . Multiple wounds    on lower legs - sees [name] Pain at the [facility]  . OSA on CPAP    uses cpap, setting of 5, 01/28/17- does not use any longer  . 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 Stroke ([place]) 12/02/2014   expressive aphasia 01/28/17- sometimes  . Systolic CHF ([place])   . Type II diabetes mellitus ([facility])    uncontrolled/notes 12/02/2014    Family  History  Problem Relation Age of Onset  . Diabetes Mother   . Hypertension Mother   . Diabetes Father   . Hypertension Father   . Asthma Son   . Diabetes Sister   . Diabetes Brother    Social History:   reports that he quit smoking about 46 years ago. His smoking use included Cigarettes. He has a 0.50 pack-year smoking history. He has never used smokeless tobacco. He reports that he uses drugs, including Marijuana. He reports that he does not drink alcohol.  Medications No current facility-administered medications for this encounter.   Current Outpatient Prescriptions:  .  aspirin 325 MG tablet, Take 325 mg by mouth every evening. , Disp: , Rfl:  .  atorvastatin (LIPITOR) 80 MG tablet, TAKE 1 TABLET AT 6PM., Disp: 60 tablet, Rfl: 0 .  clopidogrel (PLAVIX) 75 MG tablet, TAKE 1 TABLET BY MOUTH EVERY DAY WITH BREAKFAST, Disp: 90 tablet, Rfl: 1 .  escitalopram (LEXAPRO) 10 MG tablet, Take 1 tablet by mouth daily in evening (Patient taking differently: Take 10 mg by mouth at bedtime. ), Disp: 90 tablet, Rfl: 1 .  isosorbide mononitrate (IMDUR) 120 MG 24 hr tablet, TAKE 1 TABLET BY MOUTH DAILY., Disp: 90 tablet, Rfl: 0 .  lanthanum (FOSRENOL) 1000 MG chewable tablet, Chew 1 tablet by mouth 3 (three) times daily. , Disp: , Rfl:  .  multivitamin (RENA-VIT) TABS tablet, Take 1 tablet by mouth daily.,  Disp: , Rfl:   Exam: Current vital signs: BP 123/69 (BP Location: Right Arm)   Pulse 86   Temp 97.9 F (36.6 C) (Oral)   Resp 16   SpO2 97%  Vital signs in last 24 hours: Temp:  [97.9 F (36.6 C)] 97.9 F (36.6 C) (09/06 1808) Pulse Rate:  [86] 86 (09/06 1808) Resp:  [16] 16 (09/06 1808) BP: (123)/(69) 123/69 (09/06 1808) SpO2:  [97 %] 97 % (09/06 1809) GENERAL: Awake, alert in NAD HEENT: - Normocephalic and atraumatic, dry mm, no LN++, no Thyromegally LUNGS - occasional wheezing CV - S1S2 RRR, no m/r/g, equal upper extremity pulses, unable to palpate pulses in both lower  extremities ABDOMEN - Soft, nontender, nondistended with normoactive BS Ext: warm, with changes of chronic stasis, 2+ edema  NEURO:  Mental Status: Awake, alert, oriented to self and place. Not oriented to month/years/date Language: speech is clear.  Had difficulty naming simple objects, was not able to read the lines on the stroke chart. Was able to follow all commands. Was able to repeat. Cranial Nerves: PERRL 68mm/brisk. EOMI, visual fields full, no facial asymmetry, facial sensation intact, hearing intact, tongue/uvula/soft palate midline, normal sternocleidomastoid and trapezius muscle strength. No evidence of tongue atrophy or fibrillations Motor: Grossly 5/5 strength in the upper extremities. No asterixis. Antigravity with no vertical drift in both lower extremities although formal strength testing did reveal 4+/5 in both lower extremities. Tone: is normal and bulk is normal Sensation- Intact to light touch bilaterally Coordination: FTN intact bilaterally, unable to perform heel-knee-shin Gait- deferred  NIHSS - 3  (43for the LOC questions, 2 for best language)   Labs I have reviewed labs in epic and the results pertinent to this consultation are: Significant for anemia, hypokalemia, hyperglycemia, uremia and high creatinine, and low GFR. CBC    Component Value Date/Time   WBC 9.5 01/29/2017 1625   RBC 3.95 (L) 01/29/2017 1625   HGB 12.2 (L) 02/20/2017 1750   HGB 9.4 (L) 11/15/2016 1038   HCT 36.0 (L) 02/20/2017 1750   HCT 28.4 (L) 11/15/2016 1038   PLT 371 01/29/2017 1625   PLT 378 11/15/2016 1038   MCV 96.5 01/29/2017 1625   MCV 89 11/15/2016 1038   MCH 31.9 01/29/2017 1625   MCHC 33.1 01/29/2017 1625   RDW 17.4 (H) 01/29/2017 1625   RDW 16.3 (H) 11/15/2016 1038   LYMPHSABS 2.2 01/29/2017 1625   LYMPHSABS 1.3 11/15/2016 1038   MONOABS 0.8 01/29/2017 1625   EOSABS 0.1 01/29/2017 1625   EOSABS 0.2 11/15/2016 1038   BASOSABS 0.1 01/29/2017 1625   BASOSABS 0.1 11/15/2016  1038    CMP     Component Value Date/Time   NA 139 02/20/2017 1750   NA 139 11/15/2016 1038   K 2.9 (L) 02/20/2017 1750   CL 98 (L) 02/20/2017 1750   CO2 27 12/08/2016 1716   GLUCOSE 179 (H) 02/20/2017 1750   BUN 26 (H) 02/20/2017 1750   BUN 111 (HH) 11/15/2016 1038   CREATININE 3.90 (H) 02/20/2017 1750   CREATININE 5.01 (H) 03/12/2016 1341   CALCIUM 8.9 12/08/2016 1716   PROT 8.0 11/15/2016 1038   ALBUMIN 3.8 11/15/2016 1038   AST 11 11/15/2016 1038   ALT 7 11/15/2016 1038   ALKPHOS 64 11/15/2016 1038   BILITOT 0.3 11/15/2016 1038   GFRNONAA 8 (L) 12/08/2016 1716   GFRNONAA 11 (L) 03/12/2016 1341   GFRAA 9 (L) 12/08/2016 1716   GFRAA 13 (L) 03/12/2016 1341  Imaging I have reviewed the images obtained:  CT-scan of the brain Shows no acute changes. Aspects score 10  CT angiogram of the head and neck revealed no evidence of large vessel occlusion. Scattered atherosclerosis bilaterally. CT perfusion shows no abnormality.   Assessment:  66 year old man with a past medical history of strokes in 2015 and 2016 involving both the right and left putamen and corona radiata-likely small vessel etiology, who has residual bilateral lower extremity weakness and uses a walker to walk, also has documented occasional intermittent aphasia, who was in dialysis and had sudden onset of word finding difficulty and altered mental status.  On  my examination, he had a NIH stroke scale of 3, of which one was for LOC questions and 2 for best language.  His presentation is more consistent with encephalopathy than stroke.  He is not a candidate for IV TPA because of low NIH stroke scale as well as low suspicion for stroke. He is not a candidate for endovascular intervention because of absence of large vessel occlusion on CTA/perfusion  Impression: Evaluate for stroke/TIA Toxic metabolic encephalopathy Dialysis disequilibrium syndrome Symptomatic hypotension Evaluate for  seizure  Recommendations: -At this time, I would recommend to bring him in for observation under the hospitalist. -I would recommend allowing for permissive hypertension, if this were a vascular event -I would recommend obtaining an MRI of the brain without contrast to look for any evidence of a small stroke. -I would also check TSH, B12 and RPR. -Obtain ammonia levels -Decision on EEG based on his clinical progression. If he continues to be encephalopathic or has a seizure, we'll consider doing an EEG. For now can hold off on the EEG. -Telemetry -Aspirin 325 -Atorvastatin 80  --- Amie Portland, MD [facility] [phone]  If 7pm to 7am, please call on call as listed on AMION.  Critical care attestation This patient is critically ill and at significant risk of neurological worsening, death and care requires constant monitoring of vital signs, hemodynamics,respiratory and cardiac monitoring, neurological assessment, discussion with family, other specialists and medical decision making of high complexity. I spent 55  minutes of neurocritical care time  in the care of  this patient.

## 2017-02-20 NOTE — H&P (Signed)
History and Physical  [employer] IOE:[id number] DOB: 1950-12-18 DOA: 02/20/2017  PCP:  Shawnee Knapp, MD   Chief Complaint:  Confusion   History of Present Illness:  Pt is a 66 yo male with hx of CVA s/p residual left side weakness, ESRD on HD, HTN who was brought from HD center with cc of confusion/slurred speech/hypotension by the end of his HD session. In the ED stroke code was called and neurology evaluated the patient. CT head and CTA were unremarkable for acute events. Neurology advised admission to hospitalist for overnight monitoring and TIA workup. When I saw the patient he had aphasia and was stuttering but family said that this is his baseline and he confirmed that. He said he got a little confused during HD but he feels fine now and actually wants to go home. He denied any complaints at the moment. He denied any new motor or sensory deficits.   Review of Systems:  CONSTITUTIONAL:     No night sweats.  No fatigue.  No fever. No chills. Eyes:                            No visual changes.  No eye pain.  No eye discharge.   ENT:                              No epistaxis.  No sinus pain.  No sore throat.   No congestion. RESPIRATORY:           No cough.  No wheeze.  No hemoptysis.  No dyspnea CARDIOVASCULAR   :  No chest pains.  No palpitations. GASTROINTESTINAL:  No abdominal pain.  No nausea. No vomiting.  No diarrhea. No    constipation.  No hematemesis.  No hematochezia.  No melena. GENITOURINARY:      No urgency.  No frequency.  No dysuria.  No hematuria.  No  obstructive symptoms.  No discharge.  No pain.   MUSCULOSKELETAL:  No musculoskeletal pain.  No joint swelling.  No arthritis. NEUROLOGICAL:        No confusion.  No weakness. No headache. No seizure. PSYCHIATRIC:             No depression. No anxiety. No suicidal ideation. SKIN:                             No rashes.  No lesions.  No wounds. ENDOCRINE:                No weight loss.  No polydipsia.  No  polyuria.  No polyphagia. HEMATOLOGIC:           No purpura.  No petechiae.  No bleeding.  ALLERGIC                 : No pruritus.  No angioedema Other:  Past Medical and Surgical History:   Past Medical History:  Diagnosis Date  . CVA (cerebral vascular accident) [facility]) 12/2013   admitted in July 2015 for acute right-sided infarct/notes 12/02/2014- 01/28/17-  uses walker   . ESRD (end stage renal disease) ([place])    Hemo TTHSat  . Hypercholesterolemia   . Hypertension   . Multiple wounds    on lower legs - sees [name] Pain at the [place]  . OSA on CPAP  uses cpap, setting of 5, 01/28/17- does not use any longer  . 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 Stroke ([place]) 12/02/2014   expressive aphasia 01/28/17- sometimes  . Systolic CHF ([place])   . Type II diabetes mellitus ([place])    uncontrolled/notes 12/02/2014   Past Surgical History:  Procedure Laterality Date  . A/V FISTULAGRAM N/A 01/17/2017   Procedure: A/V Fistulagram - Left arm;  Surgeon: Elam Dutch, MD;  Location: [place] [facility];  Service: Cardiovascular;  Laterality: N/A;  . AV FISTULA PLACEMENT Left 09/27/2015   Procedure: RADIOCEPHALIC ARTERIOVENOUS (AV) FISTULA CREATION LEFT ARM;  Surgeon: Mal Misty, MD;  Location: [place];  Service: Vascular;  Laterality: Left;  . AV FISTULA PLACEMENT Left 01/29/2017   Procedure: ARTERIOVENOUS (AV) FISTULA CREATION BRACHIOCEPHALIC;  Surgeon: Elam Dutch, MD;  Location: [place];  Service: Vascular;  Laterality: Left;  . COLONOSCOPY  6-7 yrs ago  . COLONOSCOPY N/A 04/05/2013   Procedure: COLONOSCOPY;  Surgeon: Juanita Craver, MD;  Location: [facility];  Service: Endoscopy;  Laterality: N/A;  . LIGATION OF ARTERIOVENOUS  FISTULA Left 01/29/2017   Procedure: LIGATION OF RADIOCEPHALIC ARTERIOVENOUS  FISTULA;  Surgeon: Elam Dutch, MD;  [facility];  Service: Vascular;  Laterality: Left;    Social History:   reports that he quit smoking about  46 years ago. His smoking use included Cigarettes. He has a 0.50 pack-year smoking history. He has never used smokeless tobacco. He reports that he uses drugs, including Marijuana. He reports that he does not drink alcohol.  No Known Allergies  Family History  Problem Relation Age of Onset  . Diabetes Mother   . Hypertension Mother   . Diabetes Father   . Hypertension Father   . Asthma Son   . Diabetes Sister   . Diabetes Brother       Prior to Admission medications   Medication Sig Start Date End Date Taking? Authorizing Provider  aspirin 325 MG tablet Take 325 mg by mouth every evening.    Yes [provider]  atorvastatin (LIPITOR) 80 MG tablet TAKE 1 TABLET AT 6PM. 01/13/17  Yes Shawnee Knapp, MD  clopidogrel (PLAVIX) 75 MG tablet TAKE 1 TABLET BY MOUTH EVERY DAY WITH BREAKFAST Patient taking differently: TAKE 75 mg TABLET BY MOUTH EVERY DAY WITH BREAKFAST 09/28/16  Yes Shawnee Knapp, MD  escitalopram (LEXAPRO) 10 MG tablet Take 1 tablet by mouth daily in evening Patient taking differently: Take 10 mg by mouth at bedtime.  11/05/16  Yes Shawnee Knapp, MD  isosorbide mononitrate (IMDUR) 120 MG 24 hr tablet TAKE 1 TABLET BY MOUTH DAILY. 01/02/17  Yes Shawnee Knapp, MD  lanthanum (FOSRENOL) 1000 MG chewable tablet Chew 1 tablet by mouth 3 (three) times daily.    Yes [provider]  multivitamin (RENA-VIT) TABS tablet Take 1 tablet by mouth daily.   Yes [provider]    Physical Exam: BP 128/79   Pulse 83   Temp 98 F (36.7 C)   Resp (!) 23   Wt (!) 141.2 kg (311 lb 4.6 oz)   SpO2 95%   BMI 48.75 kg/m   GENERAL :   Alert and cooperative, and appears to be in no acute distress. HEAD:           normocephalic. EYES:            PERRL, EOMI.  vision is grossly intact. EARS:  hearing grossly intact.   NECK:          supple CARDIAC:    Normal S1 and S2. No gallop. No murmurs.  Vascular:     no peripheral edema.  LUNGS:       Clear to auscultation    ABDOMEN: Positive bowel sounds. Soft, nondistended, nontender. No guarding or rebound.      MSK:           No joint erythema or tenderness.  EXT           : No significant deformity or joint abnormality. Neuro        : Alert, oriented to person, place, and time.                      CN II-XII intact except for expressive aphasia which is his baseline                       Strength and sensation symmetric in UE but 4/5 in LE which is his baseline            SKIN:            No rash. No lesions. PSYCH:       No hallucination. Patient is not suicidal.          Labs on Admission:  Reviewed.   Radiological Exams on Admission: Ct Angio Head W Or Wo Contrast  Result Date: 02/20/2017 CLINICAL DATA:  Initial evaluation for acute speech difficulty, altered mental status. EXAM: CT ANGIOGRAPHY HEAD AND NECK CT PERFUSION BRAIN TECHNIQUE: Multidetector CT imaging of the head and neck was performed using the standard protocol during bolus administration of intravenous contrast. Multiplanar CT image reconstructions and MIPs were obtained to evaluate the vascular anatomy. Carotid stenosis measurements (when applicable) are obtained utilizing NASCET criteria, using the distal internal carotid diameter as the denominator. Multiphase CT imaging of the brain was performed following IV bolus contrast injection. Subsequent parametric perfusion maps were calculated using RAPID software. CONTRAST:  100 cc of Isovue 370. COMPARISON:  Prior noncontrast head CT from earlier the same day. FINDINGS: CTA NECK FINDINGS Aortic arch: Visualized aortic arch of normal caliber with normal branch pattern. Atheromatous plaque within the arch itself. No flow-limiting stenosis about the origin of the great vessels. Visualized subclavian artery is widely patent. Right carotid system: Right carotid artery system mildly tortuous but patent without significant stenosis, dissection, or occlusion. Calcified plaque about the right carotid  bifurcation without flow-limiting stenosis. Left carotid system: Left common carotid artery system tortuous but widely patent at the skullbase without stenosis, dissection, or occlusion. Atheromatous plaque about the left carotid bifurcation without flow-limiting stenosis. Vertebral arteries: Both of the vertebral arteries arise from the subclavian arteries. Right vertebral artery dominant. Multifocal atheromatous irregularity within the right vertebral artery without flow-limiting stenosis. Left vertebral artery diffusely hypoplastic. Vertebral artery's patent to the skullbase without stenosis or occlusion. Skeleton: No acute osseous abnormality. No worrisome lytic or blastic osseous lesions. Moderate degenerate spondylolysis at C3-4 through C6-7. Other neck: No acute soft tissue abnormality within the neck. No adenopathy. Right-sided central venous catheter noted. Upper chest: No acute abnormality. Review of the MIP images confirms the above findings CTA HEAD FINDINGS Anterior circulation: Petrous segments patent bilaterally without stenosis. Calcified atheromatous plaque within the cavernous/supraclinoid ICAs without high-grade stenosis. ICA termini widely patent. A1 segments patent bilaterally. Anterior cerebral arteries patent to their distal aspects without stenosis. M1 segments  patent without occlusion. Focal moderate proximal right M1 stenosis noted (series 13, image 95). No proximal M2 occlusion. Distal MCA branches well opacified and symmetric. Posterior circulation: Scattered plaque within the dominant right vertebral artery without flow-limiting stenosis. Hypoplastic left vertebral artery patent as well. Posterior inferior cerebral arteries patent bilaterally. Basilar artery tortuous but patent to its distal aspect without flow-limiting stenosis. Superior cerebral arteries patent bilaterally. Fetal type origin of the right PCA supplied via a patent right posterior communicating artery. Left PCA patent to  its distal aspect without flow-limiting stenosis. Venous sinuses: Not well evaluated due to arterial timing of the contrast bolus. Anatomic variants: Fetal type right PCA. No aneurysm or vascular malformation. Delayed phase: Not performed. Review of the MIP images confirms the above findings CT Brain Perfusion Findings: CBF (<30%) Volume: CRLmL Perfusion (Tmax>6.0s) volume: 63mL Mismatch Volume: 0 roundmL Infarction Location:No acute infarct. IMPRESSION: 1. Negative CT perfusion.  No acute infarct. 2. Negative CTA for emergent large vessel occlusion. 3. Scattered atheromatous disease involving the major arterial vasculature of the head and neck as above. Most notable finding consists of 80 focal moderate proximal right M1 stenosis. No other high-grade or correctable stenosis identified. 4. Diffuse vessel tortuosity, suggesting chronic underlying hypertension. Results were paged at the time of interpretation on 02/20/2017 at 6:12 pm to Dr. Amie Portland who immediately called back, results were conveyed via telephone. Electronically Signed   By: Jeannine Boga M.D.   On: 02/20/2017 18:27   Ct Angio Neck W Or Wo Contrast  Result Date: 02/20/2017 CLINICAL DATA:  Initial evaluation for acute speech difficulty, altered mental status. EXAM: CT ANGIOGRAPHY HEAD AND NECK CT PERFUSION BRAIN TECHNIQUE: Multidetector CT imaging of the head and neck was performed using the standard protocol during bolus administration of intravenous contrast. Multiplanar CT image reconstructions and MIPs were obtained to evaluate the vascular anatomy. Carotid stenosis measurements (when applicable) are obtained utilizing NASCET criteria, using the distal internal carotid diameter as the denominator. Multiphase CT imaging of the brain was performed following IV bolus contrast injection. Subsequent parametric perfusion maps were calculated using RAPID software. CONTRAST:  100 cc of Isovue 370. COMPARISON:  Prior noncontrast head CT from  earlier the same day. FINDINGS: CTA NECK FINDINGS Aortic arch: Visualized aortic arch of normal caliber with normal branch pattern. Atheromatous plaque within the arch itself. No flow-limiting stenosis about the origin of the great vessels. Visualized subclavian artery is widely patent. Right carotid system: Right carotid artery system mildly tortuous but patent without significant stenosis, dissection, or occlusion. Calcified plaque about the right carotid bifurcation without flow-limiting stenosis. Left carotid system: Left common carotid artery system tortuous but widely patent at the skullbase without stenosis, dissection, or occlusion. Atheromatous plaque about the left carotid bifurcation without flow-limiting stenosis. Vertebral arteries: Both of the vertebral arteries arise from the subclavian arteries. Right vertebral artery dominant. Multifocal atheromatous irregularity within the right vertebral artery without flow-limiting stenosis. Left vertebral artery diffusely hypoplastic. Vertebral artery's patent to the skullbase without stenosis or occlusion. Skeleton: No acute osseous abnormality. No worrisome lytic or blastic osseous lesions. Moderate degenerate spondylolysis at C3-4 through C6-7. Other neck: No acute soft tissue abnormality within the neck. No adenopathy. Right-sided central venous catheter noted. Upper chest: No acute abnormality. Review of the MIP images confirms the above findings CTA HEAD FINDINGS Anterior circulation: Petrous segments patent bilaterally without stenosis. Calcified atheromatous plaque within the cavernous/supraclinoid ICAs without high-grade stenosis. ICA termini widely patent. A1 segments patent bilaterally. Anterior cerebral arteries patent to  their distal aspects without stenosis. M1 segments patent without occlusion. Focal moderate proximal right M1 stenosis noted (series 13, image 95). No proximal M2 occlusion. Distal MCA branches well opacified and symmetric.  Posterior circulation: Scattered plaque within the dominant right vertebral artery without flow-limiting stenosis. Hypoplastic left vertebral artery patent as well. Posterior inferior cerebral arteries patent bilaterally. Basilar artery tortuous but patent to its distal aspect without flow-limiting stenosis. Superior cerebral arteries patent bilaterally. Fetal type origin of the right PCA supplied via a patent right posterior communicating artery. Left PCA patent to its distal aspect without flow-limiting stenosis. Venous sinuses: Not well evaluated due to arterial timing of the contrast bolus. Anatomic variants: Fetal type right PCA. No aneurysm or vascular malformation. Delayed phase: Not performed. Review of the MIP images confirms the above findings CT Brain Perfusion Findings: CBF (<30%) Volume: CRLmL Perfusion (Tmax>6.0s) volume: 76mL Mismatch Volume: 0 roundmL Infarction Location:No acute infarct. IMPRESSION: 1. Negative CT perfusion.  No acute infarct. 2. Negative CTA for emergent large vessel occlusion. 3. Scattered atheromatous disease involving the major arterial vasculature of the head and neck as above. Most notable finding consists of 80 focal moderate proximal right M1 stenosis. No other high-grade or correctable stenosis identified. 4. Diffuse vessel tortuosity, suggesting chronic underlying hypertension. Results were paged at the time of interpretation on 02/20/2017 at 6:12 pm to Dr. Amie Portland who immediately called back, results were conveyed via telephone. Electronically Signed   By: Jeannine Boga M.D.   On: 02/20/2017 18:27   Ct Cerebral Perfusion W Contrast  Result Date: 02/20/2017 CLINICAL DATA:  Initial evaluation for acute speech difficulty, altered mental status. EXAM: CT ANGIOGRAPHY HEAD AND NECK CT PERFUSION BRAIN TECHNIQUE: Multidetector CT imaging of the head and neck was performed using the standard protocol during bolus administration of intravenous contrast. Multiplanar CT  image reconstructions and MIPs were obtained to evaluate the vascular anatomy. Carotid stenosis measurements (when applicable) are obtained utilizing NASCET criteria, using the distal internal carotid diameter as the denominator. Multiphase CT imaging of the brain was performed following IV bolus contrast injection. Subsequent parametric perfusion maps were calculated using RAPID software. CONTRAST:  100 cc of Isovue 370. COMPARISON:  Prior noncontrast head CT from earlier the same day. FINDINGS: CTA NECK FINDINGS Aortic arch: Visualized aortic arch of normal caliber with normal branch pattern. Atheromatous plaque within the arch itself. No flow-limiting stenosis about the origin of the great vessels. Visualized subclavian artery is widely patent. Right carotid system: Right carotid artery system mildly tortuous but patent without significant stenosis, dissection, or occlusion. Calcified plaque about the right carotid bifurcation without flow-limiting stenosis. Left carotid system: Left common carotid artery system tortuous but widely patent at the skullbase without stenosis, dissection, or occlusion. Atheromatous plaque about the left carotid bifurcation without flow-limiting stenosis. Vertebral arteries: Both of the vertebral arteries arise from the subclavian arteries. Right vertebral artery dominant. Multifocal atheromatous irregularity within the right vertebral artery without flow-limiting stenosis. Left vertebral artery diffusely hypoplastic. Vertebral artery's patent to the skullbase without stenosis or occlusion. Skeleton: No acute osseous abnormality. No worrisome lytic or blastic osseous lesions. Moderate degenerate spondylolysis at C3-4 through C6-7. Other neck: No acute soft tissue abnormality within the neck. No adenopathy. Right-sided central venous catheter noted. Upper chest: No acute abnormality. Review of the MIP images confirms the above findings CTA HEAD FINDINGS Anterior circulation: Petrous  segments patent bilaterally without stenosis. Calcified atheromatous plaque within the cavernous/supraclinoid ICAs without high-grade stenosis. ICA termini widely patent. A1 segments patent  bilaterally. Anterior cerebral arteries patent to their distal aspects without stenosis. M1 segments patent without occlusion. Focal moderate proximal right M1 stenosis noted (series 13, image 95). No proximal M2 occlusion. Distal MCA branches well opacified and symmetric. Posterior circulation: Scattered plaque within the dominant right vertebral artery without flow-limiting stenosis. Hypoplastic left vertebral artery patent as well. Posterior inferior cerebral arteries patent bilaterally. Basilar artery tortuous but patent to its distal aspect without flow-limiting stenosis. Superior cerebral arteries patent bilaterally. Fetal type origin of the right PCA supplied via a patent right posterior communicating artery. Left PCA patent to its distal aspect without flow-limiting stenosis. Venous sinuses: Not well evaluated due to arterial timing of the contrast bolus. Anatomic variants: Fetal type right PCA. No aneurysm or vascular malformation. Delayed phase: Not performed. Review of the MIP images confirms the above findings CT Brain Perfusion Findings: CBF (<30%) Volume: CRLmL Perfusion (Tmax>6.0s) volume: 36mL Mismatch Volume: 0 roundmL Infarction Location:No acute infarct. IMPRESSION: 1. Negative CT perfusion.  No acute infarct. 2. Negative CTA for emergent large vessel occlusion. 3. Scattered atheromatous disease involving the major arterial vasculature of the head and neck as above. Most notable finding consists of 80 focal moderate proximal right M1 stenosis. No other high-grade or correctable stenosis identified. 4. Diffuse vessel tortuosity, suggesting chronic underlying hypertension. Results were paged at the time of interpretation on 02/20/2017 at 6:12 pm to Dr. Amie Portland who immediately called back, results were conveyed  via telephone. Electronically Signed   By: Jeannine Boga M.D.   On: 02/20/2017 18:27   Ct Head Code Stroke Wo Contrast  Result Date: 02/20/2017 CLINICAL DATA:  Code stroke. Initial evaluation for acute altered mental status. EXAM: CT HEAD WITHOUT CONTRAST TECHNIQUE: Contiguous axial images were obtained from the base of the skull through the vertex without intravenous contrast. COMPARISON:  Prior MRI from 12/02/2014. FINDINGS: Brain: Generalized age related cerebral atrophy with moderate chronic small vessel ischemic disease. No acute intracranial hemorrhage. No evidence for acute large vessel territory infarct. No mass lesion, midline shift or mass effect. No hydrocephalus. No extra-axial fluid collection. Vascular: No asymmetric hyperdense vessel. Extensive intracranial atherosclerosis. Skull: Scalp soft tissues within normal limits.  Calvarium intact. Sinuses/Orbits: Globes and orbital soft tissues within normal limits. Paranasal sinuses are clear. No mastoid effusion. Other: None. ASPECTS [facility] Stroke Program Early CT Score) - Ganglionic level infarction (caudate, lentiform nuclei, internal capsule, insula, M1-M3 cortex): 7 - Supraganglionic infarction (M4-M6 cortex): 3 Total score (0-10 with 10 being normal): 10 IMPRESSION: 1. No acute intracranial infarct or other process identified. 2. ASPECTS is 10 3. Moderate cerebral atrophy with chronic small vessel ischemic disease. Critical Value/emergent results were called by telephone at the time of interpretation on 02/20/2017 at 5:48 pm to Dr. Marda Stalker , who verbally acknowledged these results. Results also paged to Dr. Rory Percy of the Stroke Neurology service via secure page at 5:53 p.m. on 02/20/2017. Electronically Signed   By: Jeannine Boga M.D.   On: 02/20/2017 17:54    EKG:  Independently reviewed. NSR  Assessment/Plan  Encephalopathy; transient and resolved, could be metabolic or related to hypotension due to HD. But need to  r/o TIA / stroke. Appreciate neuro recs  Stroke/TIA:  GEX:[id number]   Telemetry for 24 hours   LDL : goal < 100 ( < 70 for very high risk persons with multiple risk factors) check lipid profile in am  HgbA1c in am  VTE prophylaxis: [place] hep  Antiplatelet therapy: Asp  325 mg  And plavix  75 mg daily  Cont statin     Neurochecks Q4H, PT/OT/Speech therapy, Neurology consult.   Bedside swallow evaluation, aspiration precautions.  BP: P keep BP </= 220/120 for 24-48 hours then gradually normalize within a few days awaiting MRI result         HTN: hold meds for now  Hypokalemia: give kCL 20 meq and monitor in am  Mild leukocytosis: no evidence of infection, will monitor.  Input & Output: NA DVT prophylaxis: [place] hep GI prophylaxis:NA Consultants: neuro Code Status: full Family Communication: at bedside   Disposition Plan: TBD    Gennaro Africa M.D [facility]

## 2017-02-21 DIAGNOSIS — N2581 Secondary hyperparathyroidism of renal origin: Secondary | ICD-10-CM | POA: Diagnosis present

## 2017-02-21 DIAGNOSIS — R29703 NIHSS score 3: Secondary | ICD-10-CM | POA: Diagnosis present

## 2017-02-21 DIAGNOSIS — I5020 Unspecified systolic (congestive) heart failure: Secondary | ICD-10-CM | POA: Diagnosis present

## 2017-02-21 DIAGNOSIS — G9341 Metabolic encephalopathy: Secondary | ICD-10-CM | POA: Diagnosis present

## 2017-02-21 DIAGNOSIS — E876 Hypokalemia: Secondary | ICD-10-CM | POA: Diagnosis present

## 2017-02-21 DIAGNOSIS — N184 Chronic kidney disease, stage 4 (severe): Secondary | ICD-10-CM

## 2017-02-21 DIAGNOSIS — Z7982 Long term (current) use of aspirin: Secondary | ICD-10-CM | POA: Diagnosis not present

## 2017-02-21 DIAGNOSIS — I132 Hypertensive heart and chronic kidney disease with heart failure and with stage 5 chronic kidney disease, or end stage renal disease: Secondary | ICD-10-CM | POA: Diagnosis present

## 2017-02-21 DIAGNOSIS — I63512 Cerebral infarction due to unspecified occlusion or stenosis of left middle cerebral artery: Principal | ICD-10-CM

## 2017-02-21 DIAGNOSIS — I10 Essential (primary) hypertension: Secondary | ICD-10-CM

## 2017-02-21 DIAGNOSIS — Z7902 Long term (current) use of antithrombotics/antiplatelets: Secondary | ICD-10-CM | POA: Diagnosis not present

## 2017-02-21 DIAGNOSIS — N186 End stage renal disease: Secondary | ICD-10-CM | POA: Diagnosis present

## 2017-02-21 DIAGNOSIS — I69320 Aphasia following cerebral infarction: Secondary | ICD-10-CM | POA: Diagnosis not present

## 2017-02-21 DIAGNOSIS — G459 Transient cerebral ischemic attack, unspecified: Secondary | ICD-10-CM | POA: Diagnosis present

## 2017-02-21 DIAGNOSIS — R4789 Other speech disturbances: Secondary | ICD-10-CM | POA: Diagnosis not present

## 2017-02-21 DIAGNOSIS — E1122 Type 2 diabetes mellitus with diabetic chronic kidney disease: Secondary | ICD-10-CM | POA: Diagnosis present

## 2017-02-21 DIAGNOSIS — I953 Hypotension of hemodialysis: Secondary | ICD-10-CM | POA: Diagnosis not present

## 2017-02-21 DIAGNOSIS — I638 Other cerebral infarction: Secondary | ICD-10-CM | POA: Diagnosis not present

## 2017-02-21 DIAGNOSIS — I672 Cerebral atherosclerosis: Secondary | ICD-10-CM | POA: Diagnosis present

## 2017-02-21 DIAGNOSIS — D649 Anemia, unspecified: Secondary | ICD-10-CM | POA: Diagnosis present

## 2017-02-21 DIAGNOSIS — Z992 Dependence on renal dialysis: Secondary | ICD-10-CM | POA: Diagnosis not present

## 2017-02-21 DIAGNOSIS — L8990 Pressure ulcer of unspecified site, unspecified stage: Secondary | ICD-10-CM | POA: Diagnosis present

## 2017-02-21 DIAGNOSIS — Z87891 Personal history of nicotine dependence: Secondary | ICD-10-CM | POA: Diagnosis not present

## 2017-02-21 DIAGNOSIS — E785 Hyperlipidemia, unspecified: Secondary | ICD-10-CM | POA: Diagnosis present

## 2017-02-21 DIAGNOSIS — G4733 Obstructive sleep apnea (adult) (pediatric): Secondary | ICD-10-CM | POA: Diagnosis present

## 2017-02-21 DIAGNOSIS — I69354 Hemiplegia and hemiparesis following cerebral infarction affecting left non-dominant side: Secondary | ICD-10-CM | POA: Diagnosis not present

## 2017-02-21 DIAGNOSIS — Z6841 Body Mass Index (BMI) 40.0 and over, adult: Secondary | ICD-10-CM | POA: Diagnosis not present

## 2017-02-21 LAB — LIPID PANEL
CHOL/HDL RATIO: 2.6 ratio
Cholesterol: 112 mg/dL (ref 0–200)
HDL: 43 mg/dL (ref >40)
LDL CALC: 50 mg/dL (ref 0–99)
Triglycerides: 95 mg/dL (ref <150)
VLDL: 19 mg/dL (ref 0–40)

## 2017-02-21 LAB — HEMOGLOBIN A1C
Hgb A1c MFr Bld: 6.9 % — ABNORMAL HIGH (ref 4.8–5.6)
MEAN PLASMA GLUCOSE: 151.33 mg/dL

## 2017-02-21 LAB — GLUCOSE, CAPILLARY
Glucose-Capillary: 116 mg/dL — ABNORMAL HIGH (ref 65–99)
Glucose-Capillary: 129 mg/dL — ABNORMAL HIGH (ref 65–99)

## 2017-02-21 LAB — RPR: RPR Ser Ql: NONREACTIVE

## 2017-02-21 MED ORDER — NEPRO/CARBSTEADY PO LIQD
237.0000 mL | Freq: Two times a day (BID) | ORAL | Status: DC
  Administered 2017-02-22 – 2017-02-23 (×3): 237 mL via ORAL
  Filled 2017-02-21 (×6): qty 237

## 2017-02-21 MED ORDER — CINACALCET HCL 30 MG PO TABS
30.0000 mg | ORAL_TABLET | ORAL | Status: DC
  Filled 2017-02-21: qty 1

## 2017-02-21 NOTE — Progress Notes (Signed)
OT Cancellation Note  Patient Details Name: Blake [employer] Sr. MRN: [id number] DOB: [birth year]   Cancelled Treatment:    Reason Eval/Treat Not Completed: OT screened, no needs identified, will sign off  [facility], OT/L  [phone] 02/21/2017 02/21/2017, 11:10 AM

## 2017-02-21 NOTE — Progress Notes (Signed)
STROKE TEAM PROGRESS NOTE   HISTORY OF PRESENT ILLNESS (per record) Blake Latulippe Sr. is a 66 y.o. male with a past medical history of stroke in 2015 and 2016 (right putamen/corona radiata and 2015 as well as left putamen/corona radiata and 2016) with residual lower extremity weakness and documented occasional/intermittent aphasia, who uses a walker to walk at baseline, was at dialysis and had sudden onset of word finding difficulty, for which he was brought in as a code stroke to the emergency room for evaluation.  He was at dialysis and had sudden onset of altered mental status and word finding difficulty there. It was noted by EMS that his systolic blood pressures were in the lower 100s at best.  Initially, he seemed to be completely somnolent per the EMS but then his mentation started to improve on their way to the emergency room.  Currently on DAPT.   LKW: 1630 on 02/20/2017 Premorbid modified Rankin scale (mRS):  2-Slight disability-UNABLE to perform all activities but does not need assistance    Patient was not administered IV t-PA secondary to low NIHSS/low suspicion for stroke.  He was admitted to General Neurology for further evaluation and treatment.     SUBJECTIVE (INTERVAL HISTORY) No family at the bedside.  The patient says he became "sleepy" during dialysis.  Patient has significant expressive aphasia with hesitation, word-finding difficulty, and perseveration.   OBJECTIVE Temp:  [97.9 F (36.6 C)-98.6 F (37 C)] 98.5 F (36.9 C) (09/07 1342) Pulse Rate:  [72-90] 77 (09/07 1342) Cardiac Rhythm: Normal sinus rhythm (09/07 0813) Resp:  [16-23] 20 (09/07 1342) BP: (108-138)/(60-91) 132/76 (09/07 1342) SpO2:  [94 %-100 %] 100 % (09/07 1342) Weight:  [136.9 kg (301 lb 11.2 oz)-141.2 kg (311 lb 4.6 oz)] 136.9 kg (301 lb 11.2 oz) (09/06 2145)  CBC:   Recent Labs Lab 02/20/17 1742 02/20/17 1750  WBC 11.3*  --   NEUTROABS 7.0  --   HGB 11.0* 12.2*  HCT 33.8* 36.0*   MCV 94.9  --   PLT 289  --     Basic Metabolic Panel:   Recent Labs Lab 02/20/17 1742 02/20/17 1750  NA 137 139  K 2.8* 2.9*  CL 101 98*  CO2 26  --   GLUCOSE 177* 179*  BUN 25* 26*  CREATININE 3.76* 3.90*  CALCIUM 8.0*  --     Lipid Panel:     Component Value Date/Time   CHOL 112 02/21/2017 0604   TRIG 95 02/21/2017 0604   HDL 43 02/21/2017 0604   CHOLHDL 2.6 02/21/2017 0604   VLDL 19 02/21/2017 0604   LDLCALC 50 02/21/2017 0604   HgbA1c:  Lab Results  Component Value Date   HGBA1C 6.9 (H) 02/21/2017   Urine Drug Screen:     Component Value Date/Time   LABOPIA NONE DETECTED 05/29/2015 2130   COCAINSCRNUR NONE DETECTED 05/29/2015 2130   LABBENZ NONE DETECTED 05/29/2015 2130   AMPHETMU NONE DETECTED 05/29/2015 2130   THCU NONE DETECTED 05/29/2015 2130   LABBARB NONE DETECTED 05/29/2015 2130    Alcohol Level     Component Value Date/Time   ETH <11 01/13/2014 1856    IMAGING  Ct Angio Head W Or Wo Contrast Ct Angio Neck W Or Wo Contrast Ct Cerebral Perfusion W Contrast 02/20/2017 IMPRESSION: 1. Negative CT perfusion.  No acute infarct. 2. Negative CTA for emergent large vessel occlusion. 3. Scattered atheromatous disease involving the major arterial vasculature of the head and neck as above. Most notable  finding consists of 80 focal moderate proximal right M1 stenosis. No other high-grade or correctable stenosis identified. 4. Diffuse vessel tortuosity, suggesting chronic underlying hypertension.  [name] 02/20/2017 IMPRESSION: 1. Three total subcentimeter acute ischemic nonhemorrhagic infarcts involving the left parietal lobe, left splenium, and right frontal lobe as above. Findings are likely embolic in nature. 2. Remote left thalamic, pontine, and bilateral cerebellar infarcts. 3. Moderate chronic microvascular ischemic disease.    Ct Head Code Stroke Wo Contrast 02/20/2017 IMPRESSION: 1. No acute intracranial infarct or other process  identified. 2. ASPECTS is 10 3. Moderate cerebral atrophy with chronic small vessel ischemic disease.    PHYSICAL EXAM Obese middle aged african [place] male not in distress. . Afebrile. Head is nontraumatic. Neck is supple without bruit.    Cardiac exam no murmur or gallop. Lungs are clear to auscultation. Distal pulses are well felt. Neurological Exam :  Awake  Alert oriented x2 nonfluent speech with expressive language difficulties with word hesitation. Paraphasic errors. Difficulty with naming and repetition. Good comprehension.Eye movements are full range without nystagmus.mild right lower face weakness. Tongue midline. Motor exam shows mild weakness of right grip and intrinsic hand muscles. Orbits left over right upper extremity. Symmetric lower extremity strength. Sensation is intact. Plantars downgoing. Gait not tested. ASSESSMENT/PLAN Mr. Blake Bell Sr. is a 66 y.o. male with history ofstroke in 2015 and 2016 (right putamen/corona radiata and 2015 as well as left putamen/corona radiata and 2016) with residual lower extremity weakness and documented occasional/intermittent aphasia, who uses a walker to walk at baseline, was at dialysis and had sudden onset of word finding difficulty, for which he was brought in as a code stroke to the emergency room for evaluation. He did not receive IV t-PA due to low NIHSS/low suspicion of stroke.   Stroke: Three total subcentimeter acute ischemic nonhemorrhagic infarcts involving the left parietal lobe, left splenium, and right frontal lobe as above, secondary to small vessel disease in the setting of hemodialysis.  Resultant mild expressive aphasia and right hand weakness  CT head: no acute stroke  MRI head: Three total subcentimeter acute ischemic nonhemorrhagic infarcts involving the left parietal lobe, left splenium, and right frontal lobe.  Remote infarcts re-demonstrated.  MRA head: not performed  CTA head/neck/cerebral perfusion: 80  focal moderate proximal right M1 stenosis.  Negative CT perfusion.  No LVO or high-grade stenosis.  2D Echo: not performed   LDL 50  HgbA1c 6.9  Heparin [place] for VTE prophylaxis Diet renal with fluid restriction Fluid restriction: 1200 mL Fluid; Room service appropriate? Yes; Fluid consistency: Thin  aspirin 325 mg daily and clopidogrel 75 mg daily prior to admission, now on aspirin 325 mg daily and clopidogrel 75 mg daily  Patient counseled to be compliant with his antithrombotic medications  Ongoing aggressive stroke risk factor management  Therapy recommendations: pending  Disposition: pending  Borderline hypertension  Stable  Permissive hypertension (OK if < 220/120) but gradually normalize in 5-7 days  Long-term BP goal normotensive  Hyperlipidemia  Home meds: atorvastatin 80 mg PO daily , resumed in hospital  LDL 50, goal < 70  Continue statin at discharge  Diabetes  HgbA1c 6.9, goal < 7.0  Controlled  Other Stroke Risk Factors  Advanced age  Morbid obesity, Body mass index is 43.29 kg/m., recommend weight loss, diet and exercise as appropriate   Hx stroke/TIA  Obstructive sleep apnea, on CPAP at home  Other Active Problems  None  Hospital day # 0  I have  personally examined this patient, reviewed notes, independently viewed imaging studies, participated in medical decision making and plan of care.ROS completed by me personally and pertinent positives fully documented  I have made any additions or clarifications directly to the above note. He presented with aphasia due to lacunar left brain infarcts likely from hypotension and dehydration in setting of dialysis with underlying small vessel disease. Recommend dual antiplatelet therapy x 3 weeks followed by Plavix alone. Continue ongoing stroke evaluation. Gretar than 50% time during this 25 minute visit was spent on counselling and coordination of care about his lacunar infarct and answering  questions. Antony Contras, MD Medical Director [facility] Stroke [facility] Pager: [phone] 02/21/2017 10:16 PM   To contact Stroke Continuity provider, please refer to [email or web address] After hours, contact General Neurology

## 2017-02-21 NOTE — Progress Notes (Signed)
PROGRESS NOTE    Blake Ina Sr.   POE:[id number]  DOB: 01/25/1951  DOA: 02/20/2017 PCP: Shawnee Knapp, MD   Brief Narrative:  Blake Shedd Sr.  is a 66 yo male with hx of CVA s/p residual left side weakness, ESRD on HD, HTN who was brought from HD center with cc of confusion/slurred speech/hypotension by the end of his HD session.   Subjective: No complaints. ROS: no complaints of nausea, vomiting, constipation diarrhea, cough, dyspnea or dysuria. No other complaints.   Assessment & Plan:   Principal Problem:   Acute ischemic left MCA stroke  MRI: Three total subcentimeter acute ischemic nonhemorrhagic infarcts involving the left parietal lobe, left splenium, and right frontal lobe as above.] CTA: see report below- no stenosis A1c: 6.9 LDL 50, HDL 43 ECHO pending -already on  Aspirin, Plavix and Lipitor  Active Problems: DM 2 - start Metformin soon (had CT with contrast) - nutrition consult    OSA (obstructive sleep apnea) - does not use CPAP at home    Morbid obesity  Body mass index is 43.29 kg/m.    Essential hypertension, benign - antihypertensives held in setting of acute CVA    Chronic kidney disease (CKD), stage IV (severe) (HCC) - will need dialysis tomorrow  DVT prophylaxis: Heparin Code Status: full code Family Communication:  Disposition Plan: home Consultants:   neuro Procedures:    Antimicrobials:  Anti-infectives    None       Objective: Vitals:   02/21/17 0345 02/21/17 0547 02/21/17 0745 02/21/17 1342  BP: 132/83 110/60 108/81 132/76  Pulse: 76 72 90 77  Resp:  18 20 20   Temp: 98.6 F (37 C) 98.4 F (36.9 C)  98.5 F (36.9 C)  TempSrc: Oral Oral  Oral  SpO2: 100% 95% 98% 100%  Weight:      Height:       No intake or output data in the 24 hours ending 02/21/17 1727 Filed Weights   02/20/17 1811 02/20/17 2145  Weight: (!) 141.2 kg (311 lb 4.6 oz) (!) 136.9 kg (301 lb 11.2 oz)    Examination: General exam:  Appears comfortable  HEENT: PERRLA, oral mucosa moist, no sclera icterus or thrush Respiratory system: Clear to auscultation. Respiratory effort normal. Cardiovascular system: S1 & S2 heard, RRR.  No murmurs  Gastrointestinal system: Abdomen soft, non-tender, nondistended. Normal bowel sound. No organomegaly Central nervous system: Alert and oriented. No focal neurological deficits. Extremities: No cyanosis, clubbing or edema Skin: No rashes or ulcers Psychiatry:  Mood & affect appropriate.     Data Reviewed: I have personally reviewed following labs and imaging studies  CBC:  Recent Labs Lab 02/20/17 1742 02/20/17 1750  WBC 11.3*  --   NEUTROABS 7.0  --   HGB 11.0* 12.2*  HCT 33.8* 36.0*  MCV 94.9  --   PLT 289  --    Basic Metabolic Panel:  Recent Labs Lab 02/20/17 1742 02/20/17 1750  NA 137 139  K 2.8* 2.9*  CL 101 98*  CO2 26  --   GLUCOSE 177* 179*  BUN 25* 26*  CREATININE 3.76* 3.90*  CALCIUM 8.0*  --    GFR: Estimated Creatinine Clearance: 26 mL/min (A) (by C-G formula based on SCr of 3.9 mg/dL (H)). Liver Function Tests:  Recent Labs Lab 02/20/17 1742  AST 17  ALT 17  ALKPHOS 70  BILITOT 0.3  PROT 6.9  ALBUMIN 3.0*   No results for input(s): LIPASE, AMYLASE in the last  168 hours.  Recent Labs Lab 02/20/17 1955  AMMONIA 27   Coagulation Profile:  Recent Labs Lab 02/20/17 1742  INR 1.10   Cardiac Enzymes: No results for input(s): CKTOTAL, CKMB, CKMBINDEX, TROPONINI in the last 168 hours. BNP (last 3 results) No results for input(s): PROBNP in the last 8760 hours. HbA1C:  Recent Labs  02/21/17 0604  HGBA1C 6.9*   CBG:  Recent Labs Lab 02/20/17 1853 02/21/17 0635 02/21/17 1144  GLUCAP 147* 116* 129*   Lipid Profile:  Recent Labs  02/21/17 0604  CHOL 112  HDL 43  LDLCALC 50  TRIG 95  CHOLHDL 2.6   Thyroid Function Tests:  Recent Labs  02/20/17 1955  TSH 2.189   Anemia Panel:  Recent Labs  02/20/17 1955    VITAMINB12 456   Urine analysis:    Component Value Date/Time   COLORURINE YELLOW 05/29/2015 2130   APPEARANCEUR CLEAR 05/29/2015 2130   LABSPEC 1.017 05/29/2015 2130   PHURINE 6.0 05/29/2015 2130   GLUCOSEU NEGATIVE 05/29/2015 2130   HGBUR TRACE (A) 05/29/2015 2130   BILIRUBINUR negative 07/09/2016 1500   KETONESUR negative 07/09/2016 1500   KETONESUR NEGATIVE 05/29/2015 2130   PROTEINUR >=300 (A) 07/09/2016 1500   PROTEINUR >300 (A) 05/29/2015 2130   UROBILINOGEN 0.2 07/09/2016 1500   UROBILINOGEN 0.2 01/13/2014 2036   NITRITE Negative 07/09/2016 1500   NITRITE NEGATIVE 05/29/2015 2130   LEUKOCYTESUR Negative 07/09/2016 1500   Sepsis Labs: @LABRCNTIP (procalcitonin:4,lacticidven:4) )No results found for this or any previous visit (from the past 240 hour(s)).       Radiology Studies: Ct Angio Head W Or Wo Contrast  Result Date: 02/20/2017 CLINICAL DATA:  Initial evaluation for acute speech difficulty, altered mental status. EXAM: CT ANGIOGRAPHY HEAD AND NECK CT PERFUSION BRAIN TECHNIQUE: Multidetector CT imaging of the head and neck was performed using the standard protocol during bolus administration of intravenous contrast. Multiplanar CT image reconstructions and MIPs were obtained to evaluate the vascular anatomy. Carotid stenosis measurements (when applicable) are obtained utilizing NASCET criteria, using the distal internal carotid diameter as the denominator. Multiphase CT imaging of the brain was performed following IV bolus contrast injection. Subsequent parametric perfusion maps were calculated using RAPID software. CONTRAST:  100 cc of Isovue 370. COMPARISON:  Prior noncontrast head CT from earlier the same day. FINDINGS: CTA NECK FINDINGS Aortic arch: Visualized aortic arch of normal caliber with normal branch pattern. Atheromatous plaque within the arch itself. No flow-limiting stenosis about the origin of the great vessels. Visualized subclavian artery is widely  patent. Right carotid system: Right carotid artery system mildly tortuous but patent without significant stenosis, dissection, or occlusion. Calcified plaque about the right carotid bifurcation without flow-limiting stenosis. Left carotid system: Left common carotid artery system tortuous but widely patent at the skullbase without stenosis, dissection, or occlusion. Atheromatous plaque about the left carotid bifurcation without flow-limiting stenosis. Vertebral arteries: Both of the vertebral arteries arise from the subclavian arteries. Right vertebral artery dominant. Multifocal atheromatous irregularity within the right vertebral artery without flow-limiting stenosis. Left vertebral artery diffusely hypoplastic. Vertebral artery's patent to the skullbase without stenosis or occlusion. Skeleton: No acute osseous abnormality. No worrisome lytic or blastic osseous lesions. Moderate degenerate spondylolysis at C3-4 through C6-7. Other neck: No acute soft tissue abnormality within the neck. No adenopathy. Right-sided central venous catheter noted. Upper chest: No acute abnormality. Review of the MIP images confirms the above findings CTA HEAD FINDINGS Anterior circulation: Petrous segments patent bilaterally without stenosis. Calcified atheromatous plaque  within the cavernous/supraclinoid ICAs without high-grade stenosis. ICA termini widely patent. A1 segments patent bilaterally. Anterior cerebral arteries patent to their distal aspects without stenosis. M1 segments patent without occlusion. Focal moderate proximal right M1 stenosis noted (series 13, image 95). No proximal M2 occlusion. Distal MCA branches well opacified and symmetric. Posterior circulation: Scattered plaque within the dominant right vertebral artery without flow-limiting stenosis. Hypoplastic left vertebral artery patent as well. Posterior inferior cerebral arteries patent bilaterally. Basilar artery tortuous but patent to its distal aspect without  flow-limiting stenosis. Superior cerebral arteries patent bilaterally. Fetal type origin of the right PCA supplied via a patent right posterior communicating artery. Left PCA patent to its distal aspect without flow-limiting stenosis. Venous sinuses: Not well evaluated due to arterial timing of the contrast bolus. Anatomic variants: Fetal type right PCA. No aneurysm or vascular malformation. Delayed phase: Not performed. Review of the MIP images confirms the above findings CT Brain Perfusion Findings: CBF (<30%) Volume: CRLmL Perfusion (Tmax>6.0s) volume: 44mL Mismatch Volume: 0 roundmL Infarction Location:No acute infarct. IMPRESSION: 1. Negative CT perfusion.  No acute infarct. 2. Negative CTA for emergent large vessel occlusion. 3. Scattered atheromatous disease involving the major arterial vasculature of the head and neck as above. Most notable finding consists of 80 focal moderate proximal right M1 stenosis. No other high-grade or correctable stenosis identified. 4. Diffuse vessel tortuosity, suggesting chronic underlying hypertension. Results were paged at the time of interpretation on 02/20/2017 at 6:12 pm to Dr. Amie Portland who immediately called back, results were conveyed via telephone. Electronically Signed   By: Jeannine Boga M.D.   On: 02/20/2017 18:27   Ct Angio Neck W Or Wo Contrast  Result Date: 02/20/2017 CLINICAL DATA:  Initial evaluation for acute speech difficulty, altered mental status. EXAM: CT ANGIOGRAPHY HEAD AND NECK CT PERFUSION BRAIN TECHNIQUE: Multidetector CT imaging of the head and neck was performed using the standard protocol during bolus administration of intravenous contrast. Multiplanar CT image reconstructions and MIPs were obtained to evaluate the vascular anatomy. Carotid stenosis measurements (when applicable) are obtained utilizing NASCET criteria, using the distal internal carotid diameter as the denominator. Multiphase CT imaging of the brain was performed following  IV bolus contrast injection. Subsequent parametric perfusion maps were calculated using RAPID software. CONTRAST:  100 cc of Isovue 370. COMPARISON:  Prior noncontrast head CT from earlier the same day. FINDINGS: CTA NECK FINDINGS Aortic arch: Visualized aortic arch of normal caliber with normal branch pattern. Atheromatous plaque within the arch itself. No flow-limiting stenosis about the origin of the great vessels. Visualized subclavian artery is widely patent. Right carotid system: Right carotid artery system mildly tortuous but patent without significant stenosis, dissection, or occlusion. Calcified plaque about the right carotid bifurcation without flow-limiting stenosis. Left carotid system: Left common carotid artery system tortuous but widely patent at the skullbase without stenosis, dissection, or occlusion. Atheromatous plaque about the left carotid bifurcation without flow-limiting stenosis. Vertebral arteries: Both of the vertebral arteries arise from the subclavian arteries. Right vertebral artery dominant. Multifocal atheromatous irregularity within the right vertebral artery without flow-limiting stenosis. Left vertebral artery diffusely hypoplastic. Vertebral artery's patent to the skullbase without stenosis or occlusion. Skeleton: No acute osseous abnormality. No worrisome lytic or blastic osseous lesions. Moderate degenerate spondylolysis at C3-4 through C6-7. Other neck: No acute soft tissue abnormality within the neck. No adenopathy. Right-sided central venous catheter noted. Upper chest: No acute abnormality. Review of the MIP images confirms the above findings CTA HEAD FINDINGS Anterior circulation: Petrous segments  patent bilaterally without stenosis. Calcified atheromatous plaque within the cavernous/supraclinoid ICAs without high-grade stenosis. ICA termini widely patent. A1 segments patent bilaterally. Anterior cerebral arteries patent to their distal aspects without stenosis. M1 segments  patent without occlusion. Focal moderate proximal right M1 stenosis noted (series 13, image 95). No proximal M2 occlusion. Distal MCA branches well opacified and symmetric. Posterior circulation: Scattered plaque within the dominant right vertebral artery without flow-limiting stenosis. Hypoplastic left vertebral artery patent as well. Posterior inferior cerebral arteries patent bilaterally. Basilar artery tortuous but patent to its distal aspect without flow-limiting stenosis. Superior cerebral arteries patent bilaterally. Fetal type origin of the right PCA supplied via a patent right posterior communicating artery. Left PCA patent to its distal aspect without flow-limiting stenosis. Venous sinuses: Not well evaluated due to arterial timing of the contrast bolus. Anatomic variants: Fetal type right PCA. No aneurysm or vascular malformation. Delayed phase: Not performed. Review of the MIP images confirms the above findings CT Brain Perfusion Findings: CBF (<30%) Volume: CRLmL Perfusion (Tmax>6.0s) volume: 24mL Mismatch Volume: 0 roundmL Infarction Location:No acute infarct. IMPRESSION: 1. Negative CT perfusion.  No acute infarct. 2. Negative CTA for emergent large vessel occlusion. 3. Scattered atheromatous disease involving the major arterial vasculature of the head and neck as above. Most notable finding consists of 80 focal moderate proximal right M1 stenosis. No other high-grade or correctable stenosis identified. 4. Diffuse vessel tortuosity, suggesting chronic underlying hypertension. Results were paged at the time of interpretation on 02/20/2017 at 6:12 pm to Dr. Amie Portland who immediately called back, results were conveyed via telephone. Electronically Signed   By: Jeannine Boga M.D.   On: 02/20/2017 18:27   [name]  Result Date: 02/20/2017 CLINICAL DATA:  Initial evaluation for acute confusion, slurred speech. EXAM: MRI HEAD WITHOUT CONTRAST TECHNIQUE: Multiplanar, multiecho pulse  sequences of the brain and surrounding structures were obtained without intravenous contrast. COMPARISON:  Prior CTA from earlier same day as well as previous MRI from 12/03/2014. FINDINGS: Brain: Study moderately degraded by motion artifact. Diffuse prominence of the CSF containing spaces compatible with generalized cerebral atrophy. Patchy and confluent T2/FLAIR hyperintensity within the periventricular and deep white matter both cerebral hemispheres most consistent with chronic microvascular ischemic disease, moderate nature. Associated remote lacunar infarcts present within the left basal ganglia/corona radiata out. Remote hemorrhagic left thalamic lacunar infarct. Remote lacunar infarcts within the pons. Probable few scattered tiny remote bilateral cerebellar infarcts noted as well. Punctate 4 mm left parietal cortical infarct (series 3, image 36). 8 mm left splenium all infarct (series 3, image 33) 5 mm subcortical right frontal infarct (series 3, image 34). Few additional probable late subacute subcentimeter infarcts noted at the bilateral centrum semi ovale. No associated hemorrhage or mass effect. No mass lesion, midline shift or mass effect. No hydrocephalus. No extra-axial fluid collection. Major dural sinuses patent. Vascular: Partially abnormal flow void within the hypoplastic left vertebral artery, consistent with previously seen atheromatous disease on prior CTA. Major intracranial vascular flow voids otherwise maintained. Tortuous basilar artery invaginate CED upon the brainstem. Skull and upper cervical spine: Craniocervical junction normal. Multilevel degenerative spondylolysis noted within the upper cervical spine without significant stenosis. Bone marrow signal intensity normal. No scalp soft tissue abnormality. Sinuses/Orbits: Globes normal soft tissues within normal limits. Paranasal sinuses clear. Trace right mastoid effusion. Inner ear structures normal. Other: None. IMPRESSION: 1. Three  total subcentimeter acute ischemic nonhemorrhagic infarcts involving the left parietal lobe, left splenium, and right frontal lobe as above. Findings  are likely embolic in nature. 2. Remote left thalamic, pontine, and bilateral cerebellar infarcts. 3. Moderate chronic microvascular ischemic disease. Electronically Signed   By: Jeannine Boga M.D.   On: 02/20/2017 21:13   Ct Cerebral Perfusion W Contrast  Result Date: 02/20/2017 CLINICAL DATA:  Initial evaluation for acute speech difficulty, altered mental status. EXAM: CT ANGIOGRAPHY HEAD AND NECK CT PERFUSION BRAIN TECHNIQUE: Multidetector CT imaging of the head and neck was performed using the standard protocol during bolus administration of intravenous contrast. Multiplanar CT image reconstructions and MIPs were obtained to evaluate the vascular anatomy. Carotid stenosis measurements (when applicable) are obtained utilizing NASCET criteria, using the distal internal carotid diameter as the denominator. Multiphase CT imaging of the brain was performed following IV bolus contrast injection. Subsequent parametric perfusion maps were calculated using RAPID software. CONTRAST:  100 cc of Isovue 370. COMPARISON:  Prior noncontrast head CT from earlier the same day. FINDINGS: CTA NECK FINDINGS Aortic arch: Visualized aortic arch of normal caliber with normal branch pattern. Atheromatous plaque within the arch itself. No flow-limiting stenosis about the origin of the great vessels. Visualized subclavian artery is widely patent. Right carotid system: Right carotid artery system mildly tortuous but patent without significant stenosis, dissection, or occlusion. Calcified plaque about the right carotid bifurcation without flow-limiting stenosis. Left carotid system: Left common carotid artery system tortuous but widely patent at the skullbase without stenosis, dissection, or occlusion. Atheromatous plaque about the left carotid bifurcation without flow-limiting  stenosis. Vertebral arteries: Both of the vertebral arteries arise from the subclavian arteries. Right vertebral artery dominant. Multifocal atheromatous irregularity within the right vertebral artery without flow-limiting stenosis. Left vertebral artery diffusely hypoplastic. Vertebral artery's patent to the skullbase without stenosis or occlusion. Skeleton: No acute osseous abnormality. No worrisome lytic or blastic osseous lesions. Moderate degenerate spondylolysis at C3-4 through C6-7. Other neck: No acute soft tissue abnormality within the neck. No adenopathy. Right-sided central venous catheter noted. Upper chest: No acute abnormality. Review of the MIP images confirms the above findings CTA HEAD FINDINGS Anterior circulation: Petrous segments patent bilaterally without stenosis. Calcified atheromatous plaque within the cavernous/supraclinoid ICAs without high-grade stenosis. ICA termini widely patent. A1 segments patent bilaterally. Anterior cerebral arteries patent to their distal aspects without stenosis. M1 segments patent without occlusion. Focal moderate proximal right M1 stenosis noted (series 13, image 95). No proximal M2 occlusion. Distal MCA branches well opacified and symmetric. Posterior circulation: Scattered plaque within the dominant right vertebral artery without flow-limiting stenosis. Hypoplastic left vertebral artery patent as well. Posterior inferior cerebral arteries patent bilaterally. Basilar artery tortuous but patent to its distal aspect without flow-limiting stenosis. Superior cerebral arteries patent bilaterally. Fetal type origin of the right PCA supplied via a patent right posterior communicating artery. Left PCA patent to its distal aspect without flow-limiting stenosis. Venous sinuses: Not well evaluated due to arterial timing of the contrast bolus. Anatomic variants: Fetal type right PCA. No aneurysm or vascular malformation. Delayed phase: Not performed. Review of the MIP  images confirms the above findings CT Brain Perfusion Findings: CBF (<30%) Volume: CRLmL Perfusion (Tmax>6.0s) volume: 10mL Mismatch Volume: 0 roundmL Infarction Location:No acute infarct. IMPRESSION: 1. Negative CT perfusion.  No acute infarct. 2. Negative CTA for emergent large vessel occlusion. 3. Scattered atheromatous disease involving the major arterial vasculature of the head and neck as above. Most notable finding consists of 80 focal moderate proximal right M1 stenosis. No other high-grade or correctable stenosis identified. 4. Diffuse vessel tortuosity, suggesting chronic underlying hypertension. Results  were paged at the time of interpretation on 02/20/2017 at 6:12 pm to Dr. Amie Portland who immediately called back, results were conveyed via telephone. Electronically Signed   By: Jeannine Boga M.D.   On: 02/20/2017 18:27   Ct Head Code Stroke Wo Contrast  Result Date: 02/20/2017 CLINICAL DATA:  Code stroke. Initial evaluation for acute altered mental status. EXAM: CT HEAD WITHOUT CONTRAST TECHNIQUE: Contiguous axial images were obtained from the base of the skull through the vertex without intravenous contrast. COMPARISON:  Prior MRI from 12/02/2014. FINDINGS: Brain: Generalized age related cerebral atrophy with moderate chronic small vessel ischemic disease. No acute intracranial hemorrhage. No evidence for acute large vessel territory infarct. No mass lesion, midline shift or mass effect. No hydrocephalus. No extra-axial fluid collection. Vascular: No asymmetric hyperdense vessel. Extensive intracranial atherosclerosis. Skull: Scalp soft tissues within normal limits.  Calvarium intact. Sinuses/Orbits: Globes and orbital soft tissues within normal limits. Paranasal sinuses are clear. No mastoid effusion. Other: None. ASPECTS [facility] Stroke Program Early CT Score) - Ganglionic level infarction (caudate, lentiform nuclei, internal capsule, insula, M1-M3 cortex): 7 - Supraganglionic infarction  (M4-M6 cortex): 3 Total score (0-10 with 10 being normal): 10 IMPRESSION: 1. No acute intracranial infarct or other process identified. 2. ASPECTS is 10 3. Moderate cerebral atrophy with chronic small vessel ischemic disease. Critical Value/emergent results were called by telephone at the time of interpretation on 02/20/2017 at 5:48 pm to Dr. Marda Stalker , who verbally acknowledged these results. Results also paged to Dr. Rory Percy of the Stroke Neurology service via secure page at 5:53 p.m. on 02/20/2017. Electronically Signed   By: Jeannine Boga M.D.   On: 02/20/2017 17:54      Scheduled Meds: . aspirin  325 mg Oral QPM  . atorvastatin  80 mg Oral q1800  . clopidogrel  75 mg Oral Daily  . escitalopram  10 mg Oral QHS  . heparin  5,000 Units Subcutaneous Q8H  . lanthanum  1,000 mg Oral TID WC  . multivitamin  1 tablet Oral Daily   Continuous Infusions:   LOS: 0 days    Time spent in minutes: 35    Debbe Odea, MD [facility] Pager: [email or web address] Password [email or web address] 02/21/2017, 5:27 PM

## 2017-02-21 NOTE — Evaluation (Signed)
Physical Therapy Evaluation Patient Details Name: Blake Wadel Sr. MRN: [id number] DOB: 06-20-1950 Today's Date: 02/21/2017   History of Present Illness  pt is a 31 y;/o male with pmh significant for CVA with residual L sided weakness, ESRD on HD, HTN, admitted from HD center with confusion/slurred speech and hypotension.  MRI showed three total subcentimeter acute ischemic nonhemorrhagic infarcts involving the left parietal lobe, left splenium, and right frontal lobe.  Clinical Impression  Pt is at or close to baseline functioning and should be safe at home alone for time period or at [facility]. There are no further acute PT needs.  Will sign off at this time.     Follow Up Recommendations Other (comment);No PT follow up (Starting PACE)    Equipment Recommendations  None recommended by PT    Recommendations for Other Services       Precautions / Restrictions Precautions Precautions: Fall Restrictions Weight Bearing Restrictions: No      Mobility  Bed Mobility               General bed mobility comments: up in recliner on arrival  Transfers Overall transfer level: Needs assistance   Transfers: Sit to/from Stand Sit to Stand: Min guard;Mod assist (mod from very low toilet )         General transfer comment: uses safe technique  Ambulation/Gait Ambulation/Gait assistance: Supervision Ambulation Distance (Feet): 130 Feet Assistive device: Rolling walker (2 wheeled) Gait Pattern/deviations: Step-through pattern Gait velocity: slower cadence Gait velocity interpretation: Below normal speed for age/gender General Gait Details: generally steady gait and safe use of the RW  Stairs            Wheelchair Mobility    Modified Rankin (Stroke Patients Only)       Balance Overall balance assessment: Needs assistance Sitting-balance support: No upper extremity supported Sitting balance-Leahy Scale: Good       Standing balance-Leahy Scale: Fair Standing  balance comment: hygiene after toileting without UE support                             Pertinent Vitals/Pain Pain Assessment: No/denies pain    Home Living Family/patient expects to be discharged to:: Private residence Living Arrangements: Spouse/significant other Available Help at Discharge: Family;Available PRN/intermittently (wife works,  pt has just started [employer]) Type of Home: House Home Access: Level entry     Home Layout: One level Home Equipment: Environmental consultant - 2 wheels;Shower seat;Toilet riser (riser ? with arms)      Prior Function Level of Independence: Independent with assistive device(s)               Hand Dominance   Dominant Hand: Right    Extremity/Trunk Assessment   Upper Extremity Assessment Upper Extremity Assessment: Overall WFL for tasks assessed;RUE deficits/detail;LUE deficits/detail RUE Deficits / Details: strong and functional LUE Deficits / Details: moderate strength, mild coordination issues, no new changes from this present incident per pt. LUE Coordination: decreased fine motor    Lower Extremity Assessment Lower Extremity Assessment: Overall WFL for tasks assessed;RLE deficits/detail;LLE deficits/detail RLE Deficits / Details: mildly weaker that left due to arthritic changes at knee LLE Deficits / Details: functional strength, minimal incoordination        Communication   Communication: Expressive difficulties  Cognition Arousal/Alertness: Awake/alert Behavior During Therapy: WFL for tasks assessed/performed Overall Cognitive Status: Within Functional Limits for tasks assessed (not formally tested)  General Comments      Exercises     Assessment/Plan    PT Assessment Patent does not need any further PT services  PT Problem List Decreased balance;Decreased mobility;Decreased coordination;Decreased activity tolerance       PT Treatment Interventions      PT  Goals (Current goals can be found in the Care Plan section)  Acute Rehab PT Goals PT Goal Formulation: All assessment and education complete, DC therapy    Frequency     Barriers to discharge        Co-evaluation               AM-PAC PT "6 Clicks" Daily Activity  Outcome Measure Difficulty turning over in bed (including adjusting bedclothes, sheets and blankets)?: A Little Difficulty moving from lying on back to sitting on the side of the bed? : A Little Difficulty sitting down on and standing up from a chair with arms (e.g., wheelchair, bedside commode, etc,.)?: A Little Help needed moving to and from a bed to chair (including a wheelchair)?: A Little Help needed walking in hospital room?: A Little Help needed climbing 3-5 steps with a railing? : A Little 6 Click Score: 18    End of Session   Activity Tolerance: Patient tolerated treatment well Patient left: in chair;with call bell/phone within reach;with chair alarm set Nurse Communication: Mobility status PT Visit Diagnosis: Other abnormalities of gait and mobility (R26.89);Hemiplegia and hemiparesis Hemiplegia - Right/Left: Left Hemiplegia - dominant/non-dominant: Non-dominant Hemiplegia - caused by: Cerebral infarction    Time: 1012-1101 PT Time Calculation (min) (ACUTE ONLY): 49 min   Charges:   PT Evaluation $PT Eval Moderate Complexity: 1 Mod PT Treatments $Gait Training: 8-22 mins $Therapeutic Activity: 8-22 mins   PT G Codes:   PT G-Codes **NOT FOR INPATIENT CLASS** Functional Assessment Tool Used: AM-PAC 6 Clicks Basic Mobility;Clinical judgement Functional Limitation: Mobility: Walking and moving around Mobility: Walking and Moving Around Current Status (E7517): At least 1 percent but less than 20 percent impaired, limited or restricted Mobility: Walking and Moving Around Goal Status [phone]): At least 1 percent but less than 20 percent impaired, limited or restricted Mobility: Walking and Moving  Around Discharge Status [phone]): At least 1 percent but less than 20 percent impaired, limited or restricted    02/21/2017  Donnella Sham, PT [phone]  (pager)  Tessie Fass Roxanna Mcever 02/21/2017, 11:20 AM

## 2017-02-21 NOTE — Progress Notes (Signed)
   02/21/17 1600  Clinical Encounter Type  Visited With Patient  Visit Type Initial  Referral From Other (Comment)  Consult/Referral To Chaplain  Spiritual Encounters  Spiritual Needs Emotional;Grief support  Stress Factors  Patient Stress Factors Major life changes;Family relationships;Lack of knowledge  Advance Directives (For Healthcare)  Does Patient Have a Medical Advance Directive? No  Chaplain engaged patient during rounds.  Chaplain observed patient sitting in recliner watching TV, the sound was down and he was staring into the TV.  Chaplain introduced himself to patient who immediately engaged him in conversation.  Patient talked about being on dialysis and urinating himself.  During exploration chaplain uncovered patient was experiencing major life changes and his sense of pride made it difficult to deal with these changes.    Patient talked about wife that was coming from Fathers funeral in [place] to see him.  Patient expressed support from children in tending to his needs.  Chaplain made patient feel that he was in a safe space to talk and express himself.  Patient began crying as he started to share how his routine was altered from being in the hospital and now he cannot work.  Patient has been working for 58 years.  Chaplain explained that this may be his season of rest and working should be of little concern with his health being where it is.   Patient was willing to open up to chaplain about family relationships.  In speaking with RN the patient could use a greater understanding of the care plan.  Chaplain did inform RN of patients thoughts and checking for understanding.

## 2017-02-21 NOTE — Evaluation (Signed)
Speech Language Pathology Evaluation Patient Details Name: Blake Bell. MRN: [id number] DOB: May 07, 1951 Today's Date: 02/21/2017 Time: 8841-6606 SLP Time Calculation (min) (ACUTE ONLY): 15 min  Problem List:  Patient Active Problem List   Diagnosis Date Noted  . TIA (transient ischemic attack) 02/20/2017  . Expressive aphasia 09/06/2016  . Weakness following cerebrovascular accident (CVA) 09/06/2016  . Poor tolerance for ambulation 09/06/2016  . Chronic kidney disease (CKD), stage IV (severe) ([place]) 09/12/2015  . Chronic diastolic heart failure, NYHA class 2 ([place]) 08/08/2015  . Anemia, iron deficiency 06/04/2015  . Acute on chronic systolic (congestive) heart failure ([place]) 05/29/2015  . Systolic CHF ([place]) 30/16/0109  . Uncontrolled diabetes with stage 4 chronic kidney disease GFR 15-29 ([place]) 12/28/2014  . Type II diabetes mellitus with neurological manifestations ([place]) 12/12/2014  . Acute ischemic left MCA stroke ([place]) 12/02/2014  . Type 2 diabetes mellitus with complication ([place]) 32/35/5732  . Essential hypertension, benign 03/24/2014  . HLD (hyperlipidemia) 03/24/2014  . CVA (cerebral infarction) 01/13/2014  . Hypernatremia 07/03/2013  . Morbid obesity ([place]) 07/03/2013  . Venous stasis ulcers ([place]) 07/03/2013  . DM (diabetes mellitus) type 2, uncontrolled, with ketoacidosis ([place]) 07/03/2013  . 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 Sellar or suprasellar mass 06/22/2013  . Acute respiratory failure ([place]) 06/22/2013  . Seizure ([place]) 06/21/2013  . Altered mental status 06/21/2013  . Hypercarbia 06/21/2013  . OSA (obstructive sleep apnea) 05/25/2013   Past Medical History:  Past Medical History:  Diagnosis Date  . CVA (cerebral vascular accident) [facility]) 12/2013   admitted in July 2015 for acute right-sided infarct/notes 12/02/2014- 01/28/17-  uses walker   . ESRD (end stage renal disease) ([place])    Hemo TTHSat  . Hypercholesterolemia   . Hypertension   . Multiple wounds     on lower legs - [name]  . OSA on CPAP    uses cpap, setting of 5, 01/28/17- does not use any longer  . 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 Stroke ([place]) 12/02/2014   expressive aphasia 01/28/17- sometimes  . Systolic CHF ([place])   . Type II diabetes mellitus ([place])    uncontrolled/notes 12/02/2014   Past Surgical History:  Past Surgical History:  Procedure Laterality Date  . A/V FISTULAGRAM N/A 01/17/2017   Procedure: A/V Fistulagram - Left arm;  Surgeon: Elam Dutch, MD;  Location: [place] [facility];  Service: Cardiovascular;  Laterality: N/A;  . AV FISTULA PLACEMENT Left 09/27/2015   Procedure: RADIOCEPHALIC ARTERIOVENOUS (AV) FISTULA CREATION LEFT ARM;  Surgeon: Mal Misty, MD;  Location: [place];  Service: Vascular;  Laterality: Left;  . AV FISTULA PLACEMENT Left 01/29/2017   Procedure: ARTERIOVENOUS (AV) FISTULA CREATION BRACHIOCEPHALIC;  Surgeon: Elam Dutch, MD;  Location: [place];  Service: Vascular;  Laterality: Left;  . COLONOSCOPY  6-7 yrs ago  . COLONOSCOPY N/A 04/05/2013   Procedure: COLONOSCOPY;  Surgeon: Juanita Craver, MD;  Location: [facility];  Service: Endoscopy;  Laterality: N/A;  . LIGATION OF ARTERIOVENOUS  FISTULA Left 01/29/2017   Procedure: LIGATION OF RADIOCEPHALIC ARTERIOVENOUS  FISTULA;  Surgeon: Elam Dutch, MD;  Location: [facility] OR;  Service: Vascular;  Laterality: Left;   HPI:  Pt is a 66 yo male with hx of CVA s/p residual left side weakness, ESRD on HD, HTN who was brought from HD center with cc of confusion/slurred speech/hypotension by the end of his HD session. CT head unremarkable for acute events.  Aphasia and stuttering present when MD saw pt but family said that this is his baseline and he confirmed that. MRI Three total subcentimeter acute ischemic nonhemorrhagic infarcts involving the left parietal lobe, left splenium, and right frontal lobe as above. Findings are likely embolic in  nature, remote left thalamic, pontine, and bilateral cerebellar infarcts. SLE 12/05/14 revealed expressive aphasia and mild receptive.    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 No family present to report on current status compared to baseline. Pt had difficulty stating how fluency/language compares to baseline and indicated that it seemed "normal" but then may be a little worse. Pt comprehended majority of basic conversation, difficulty following 2 step commands and 60% confrontational naming. Dysfluent on inititial phoneme or after 2-3 words. Overall feel pt is not significantly different from his baseline and am not recommending follow up ST. Educated pt to notify primary MD if he is having difficulty once home to order home health ST if needed.      SLP Assessment  SLP Recommendation/Assessment: Patient does not need any further Speech Lanaguage Pathology Services SLP Visit Diagnosis: Cognitive communication deficit (R41.841)    Follow Up Recommendations  None    Frequency and Duration           SLP Evaluation Cognition  Overall Cognitive Status: Within Functional Limits for tasks assessed Arousal/Alertness: Awake/alert Orientation Level: Oriented to person;Oriented to place;Oriented to time Attention: Sustained Sustained Attention: Appears intact Awareness: Impaired Awareness Impairment: Anticipatory impairment Problem Solving: Impaired Problem Solving Impairment: Verbal basic Safety/Judgment:  (questionable)       Comprehension  Auditory Comprehension Overall Auditory Comprehension: Impaired at baseline Commands: Impaired Two Step Basic Commands: 50-74% accurate Visual Recognition/Discrimination Discrimination: Not tested Reading Comprehension Reading Status: Not tested    Expression Verbal Expression Overall Verbal Expression: Impaired at  baseline Initiation: Impaired Level of Generative/Spontaneous Verbalization: Conversation Repetition: No impairment Naming: Impairment Responsive: Not tested Confrontation: Impaired (60%) Convergent: Not tested Divergent: Not tested Verbal Errors: Aware of errors (dysnomia) Pragmatics: No impairment Written Expression Dominant Hand: Right Written Expression: Not tested   Oral / Motor  Oral Motor/Sensory Function Overall Oral Motor/Sensory Function: Within functional limits Motor Speech Overall Motor Speech: Appears within functional limits for tasks assessed Respiration: Within functional limits Phonation: Normal Resonance: Within functional limits Articulation: Within functional limitis Intelligibility: Intelligible Motor Planning: Impaired Level of Impairment: Conversation   GO          Functional Assessment Tool Used: skilled clinical judgement Functional Limitations: Spoken language expressive Spoken Language Expression Current Status [phone]): At least 40 percent but less than 60 percent impaired, limited or restricted Spoken Language Expression Goal Status [phone]): At least 40 percent but less than 60 percent impaired, limited or restricted Spoken Language Expression Discharge Status [phone]): At least 40 percent but less than 60 percent impaired, limited or restricted         Houston Siren 02/21/2017, 11:53 AM Orbie Pyo Colvin Caroli.Ed [employer] [phone]

## 2017-02-21 NOTE — Care Management Note (Signed)
Case Management Note  Patient Details  Name: Blake Bell. MRN: [id number] Date of Birth: 09-02-1950  Subjective/Objective:  Pt admitted with CVA. He is from home with his spouse.                   Action/Plan: No f/u per PT/OT. Patient to start PACE program and will receive activity at their center. CM following.  Expected Discharge Date:                  Expected Discharge Plan:  Home/Self Care (Pt to start PACE)  In-House Referral:     Discharge planning Services     Post Acute Care Choice:    Choice offered to:     DME Arranged:    DME Agency:     HH Arranged:    HH Agency:     Status of Service:  In process, will continue to follow  If discussed at Long Length of Stay Meetings, dates discussed:    Additional Comments:  Pollie Friar, RN 02/21/2017, 1:19 PM

## 2017-02-22 DIAGNOSIS — R4789 Other speech disturbances: Secondary | ICD-10-CM

## 2017-02-22 DIAGNOSIS — G4733 Obstructive sleep apnea (adult) (pediatric): Secondary | ICD-10-CM

## 2017-02-22 DIAGNOSIS — I638 Other cerebral infarction: Secondary | ICD-10-CM

## 2017-02-22 DIAGNOSIS — L8990 Pressure ulcer of unspecified site, unspecified stage: Secondary | ICD-10-CM | POA: Insufficient documentation

## 2017-02-22 LAB — CBC
HCT: 34.6 % — ABNORMAL LOW (ref 39.0–52.0)
Hemoglobin: 11.4 g/dL — ABNORMAL LOW (ref 13.0–17.0)
MCH: 31.3 pg (ref 26.0–34.0)
MCHC: 32.9 g/dL (ref 30.0–36.0)
MCV: 95.1 fL (ref 78.0–100.0)
Platelets: 318 K/uL (ref 150–400)
RBC: 3.64 MIL/uL — ABNORMAL LOW (ref 4.22–5.81)
RDW: 16 % — ABNORMAL HIGH (ref 11.5–15.5)
WBC: 10.2 10*3/uL (ref 4.0–10.5)

## 2017-02-22 LAB — RENAL FUNCTION PANEL
Albumin: 3 g/dL — ABNORMAL LOW (ref 3.5–5.0)
Anion gap: 11 (ref 5–15)
BUN: 51 mg/dL — ABNORMAL HIGH (ref 6–20)
CO2: 27 mmol/L (ref 22–32)
Calcium: 9.4 mg/dL (ref 8.9–10.3)
Chloride: 98 mmol/L — ABNORMAL LOW (ref 101–111)
Creatinine, Ser: 6.79 mg/dL — ABNORMAL HIGH (ref 0.61–1.24)
GFR calc Af Amer: 9 mL/min — ABNORMAL LOW (ref >60)
GFR calc non Af Amer: 8 mL/min — ABNORMAL LOW (ref >60)
Glucose, Bld: 126 mg/dL — ABNORMAL HIGH (ref 65–99)
Phosphorus: 6.5 mg/dL — ABNORMAL HIGH (ref 2.5–4.6)
Potassium: 3.8 mmol/L (ref 3.5–5.1)
Sodium: 136 mmol/L (ref 135–145)

## 2017-02-22 LAB — GLUCOSE, CAPILLARY: Glucose-Capillary: 117 mg/dL — ABNORMAL HIGH (ref 65–99)

## 2017-02-22 LAB — HEPATITIS B SURFACE ANTIGEN: HEP B S AG: NEGATIVE

## 2017-02-22 MED ORDER — LIDOCAINE-PRILOCAINE 2.5-2.5 % EX CREA: 1.0000 "application " | TOPICAL_CREAM | CUTANEOUS | Status: DC | PRN

## 2017-02-22 MED ORDER — HEPARIN SODIUM (PORCINE) 1000 UNIT/ML DIALYSIS: 20.0000 [IU]/kg | INTRAMUSCULAR | Status: DC | PRN

## 2017-02-22 MED ORDER — SODIUM CHLORIDE 0.9 % IV SOLN: 100.0000 mL | INTRAVENOUS | Status: DC | PRN

## 2017-02-22 MED ORDER — ALTEPLASE 2 MG IJ SOLR: 2.0000 mg | Freq: Once | INTRAMUSCULAR | Status: DC | PRN

## 2017-02-22 MED ORDER — LIDOCAINE HCL (PF) 1 % IJ SOLN: 5.0000 mL | INTRAMUSCULAR | Status: DC | PRN

## 2017-02-22 MED ORDER — CLOPIDOGREL BISULFATE 75 MG PO TABS: ORAL_TABLET | ORAL | 1 refills | Status: DC

## 2017-02-22 MED ORDER — CINACALCET HCL 30 MG PO TABS: 30.0000 mg | ORAL_TABLET | ORAL | 0 refills | Status: DC

## 2017-02-22 MED ORDER — PENTAFLUOROPROP-TETRAFLUOROETH EX AERO: 1.0000 "application " | INHALATION_SPRAY | CUTANEOUS | Status: DC | PRN

## 2017-02-22 MED ORDER — SENNOSIDES-DOCUSATE SODIUM 8.6-50 MG PO TABS: 1.0000 | ORAL_TABLET | Freq: Every evening | ORAL | 0 refills | Status: DC | PRN

## 2017-02-22 MED ORDER — ATORVASTATIN CALCIUM 80 MG PO TABS: 80.0000 mg | ORAL_TABLET | Freq: Every day | ORAL | 0 refills | Status: DC

## 2017-02-22 MED ORDER — ASPIRIN 325 MG PO TABS: 325.0000 mg | ORAL_TABLET | Freq: Every evening | ORAL | 0 refills | Status: DC

## 2017-02-22 MED ORDER — HEPARIN SODIUM (PORCINE) 1000 UNIT/ML DIALYSIS
1000.0000 [IU] | INTRAMUSCULAR | Status: DC | PRN
Start: 2017-02-22 — End: 2017-02-22

## 2017-02-22 NOTE — Progress Notes (Signed)
STROKE TEAM PROGRESS NOTE   SUBJECTIVE (INTERVAL HISTORY) Wife is at the bedside. Patient was just back from dialysis. No acute distress, no acute event overnight. BP stable, however blood pressures seems low right after dialysis.   OBJECTIVE Temp:  [97.5 F (36.4 C)-98.5 F (36.9 C)] 97.5 F (36.4 C) (09/08 0431) Pulse Rate:  [74-83] 82 (09/08 0431) Cardiac Rhythm: Normal sinus rhythm (09/08 0000) Resp:  [20] 20 (09/08 0431) BP: (132-158)/(76-96) 158/96 (09/08 0431) SpO2:  [95 %-100 %] 98 % (09/08 0431) Weight:  [304 lb 1.6 oz (137.9 kg)] 304 lb 1.6 oz (137.9 kg) (09/08 0431)  CBC:   Recent Labs Lab 02/20/17 1742 02/20/17 1750  WBC 11.3*  --   NEUTROABS 7.0  --   HGB 11.0* 12.2*  HCT 33.8* 36.0*  MCV 94.9  --   PLT 289  --     Basic Metabolic Panel:   Recent Labs Lab 02/20/17 1742 02/20/17 1750  NA 137 139  K 2.8* 2.9*  CL 101 98*  CO2 26  --   GLUCOSE 177* 179*  BUN 25* 26*  CREATININE 3.76* 3.90*  CALCIUM 8.0*  --     Lipid Panel:     Component Value Date/Time   CHOL 112 02/21/2017 0604   TRIG 95 02/21/2017 0604   HDL 43 02/21/2017 0604   CHOLHDL 2.6 02/21/2017 0604   VLDL 19 02/21/2017 0604   LDLCALC 50 02/21/2017 0604   HgbA1c:  Lab Results  Component Value Date   HGBA1C 6.9 (H) 02/21/2017   Urine Drug Screen:     Component Value Date/Time   LABOPIA NONE DETECTED 05/29/2015 2130   COCAINSCRNUR NONE DETECTED 05/29/2015 2130   LABBENZ NONE DETECTED 05/29/2015 2130   AMPHETMU NONE DETECTED 05/29/2015 2130   THCU NONE DETECTED 05/29/2015 2130   LABBARB NONE DETECTED 05/29/2015 2130    Alcohol Level     Component Value Date/Time   ETH <11 01/13/2014 1856    IMAGING I have personally reviewed the radiological images below and agree with the radiology interpretations.  Ct Angio Head W Or Wo Contrast Ct Angio Neck W Or Wo Contrast Ct Cerebral Perfusion W Contrast 02/20/2017 IMPRESSION:  1. Negative CT perfusion.  No acute infarct.  2.  Negative CTA for emergent large vessel occlusion.  3. Scattered atheromatous disease involving the major arterial vasculature of the head and neck as above. Most notable finding consists of 80 focal moderate proximal right M1 stenosis. No other high-grade or correctable stenosis identified.  4. Diffuse vessel tortuosity, suggesting chronic underlying hypertension.  [name] 02/20/2017 IMPRESSION:  1. Three total subcentimeter acute ischemic nonhemorrhagic infarcts involving the left parietal lobe, left splenium, and right frontal lobe as above. Findings are likely embolic in nature.  2. Remote left thalamic, pontine, and bilateral cerebellar infarcts.  3. Moderate chronic microvascular ischemic disease.    Ct Head Code Stroke Wo Contrast 02/20/2017 IMPRESSION:  1. No acute intracranial infarct or other process identified.  2. ASPECTS is 10  3. Moderate cerebral atrophy with chronic small vessel ischemic disease.  TTE pending   PHYSICAL EXAM  Vitals:   02/22/17 1100 02/22/17 1130 02/22/17 1200 02/22/17 1244  BP: (!) 135/108 (!) 148/81 104/84 (!) 150/93  Pulse: 87 88 72 83  Resp:    (!) 21  Temp:    98.6 F (37 C)  TempSrc:    Oral  SpO2:    96%  Weight:    285 lb 15 oz (129.7  kg)  Height:       Obese middle aged african [place] male not in distress. Afebrile. Head is nontraumatic. Neck is supple without bruit.  Cardiac exam no murmur or gallop. Lungs are clear to auscultation. Distal pulses are well felt. Neurological Exam :  Awake, Alert oriented to people, however not to time or place. nonfluent speech with mild hesitation. Occasional paraphasic errors. Intact with naming and repetition. Good comprehension. Eye movements are full range without nystagmus. mild right lower face weakness. Tongue midline. Motor exam shows symmetric upper and lower extremity strength. Orbits left over right upper extremity.  Sensation is intact. Plantars downgoing. Gait not  tested.   ASSESSMENT/PLAN Mr. Blake Imel Sr. is a 66 y.o. male with history of diabetes mellitus, congestive heart failure, pituitary adenoma, obstructive sleep apnea on C Pap, hypertension, hyperlipidemia, end-stage renal disease on dialysis, and strokes in 2015 and 2016 (right putamen/corona radiata and 2015 as well as left putamen/corona radiata and 2016) with residual lower extremity weakness and documented occasional/intermittent aphasia, who uses a walker to walk at baseline, was at dialysis and had sudden onset of word finding difficulty, for which he was brought in as a code stroke to the emergency room for evaluation. He did not receive IV t-PA due to low NIHSS/low suspicion of stroke.   Stroke: bilateral MCA/ACA, left MCA/PCA watershed punctate infarcts as well as left splenium punctate infarcts, likely due to hypotension during dialysis in the setting of intracranial atherosclerosis and small vessel disease  Resultant mild expressive aphasia  CT head: no acute stroke  MRI head: Three total subcentimeter acute ischemic nonhemorrhagic infarcts involving the left parietal lobe, left splenium, and right frontal lobe. Remote infarcts re-demonstrated.  CTA head/neck/cerebral perfusion: moderate proximal right M1 stenosis.  Negative CT perfusion.  No LVO or high-grade stenosis.  2D Echo - pending  LDL 50  HgbA1c 6.9  Heparin [place] for VTE prophylaxis Diet renal with fluid restriction Fluid restriction: 1200 mL Fluid; Room service appropriate? Yes; Fluid consistency: Thin  aspirin 325 mg daily and clopidogrel 75 mg daily prior to admission, now on aspirin 325 mg daily and clopidogrel 75 mg daily. Continue DAPT on discharge  Patient counseled to be compliant with his antithrombotic medications  Ongoing aggressive stroke risk factor management  Therapy recommendations: No further PT or OT recommended.  Disposition: Pending  History of strokes - small vessel disease  12/2013 -  left BG/CR infarct - A1c 9.9, LDL 145 - put on aspirin and Lipitor  11/2014 - left putamen/CR infarct - carotid Doppler negative EF 60-65% - A1c 7.1 and LDL 169 - aspirin switched to Plavix - continue Lipitor 80  Hypertension  Stable  Permissive hypertension (OK if < 220/120) but gradually normalize in 5-7 days  Long-term BP goal normotensive Avoid hypotension, especially during and after dialysis  Hyperlipidemia  Home meds: atorvastatin 80 mg PO daily , resumed in hospital  LDL 50, goal < 70  Continue statin at discharge  Diabetes  HgbA1c 6.9, goal < 7.0  Controlled  Other Stroke Risk Factors  Advanced age  Morbid obesity, Body mass index is 43.63 kg/m., recommend weight loss, diet and exercise as appropriate   OSA on CPAP at home  Other Active Problems  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 Please call with questions. Pt will follow up with Blake Rubin, NP, at [facility] in about 6 weeks. Thanks for the consult.  Rosalin Hawking,  MD PhD Stroke Neurology 02/22/2017 4:05 PM   To contact Stroke Continuity provider, please refer to [email or web address] After hours, contact General Neurology

## 2017-02-22 NOTE — Discharge Summary (Signed)
Physician Discharge Summary  [name] HWE:[id number] DOB: 13-Oct-1950 DOA: 02/20/2017  PCP: Shawnee Knapp, MD  Admit date: 02/20/2017 Discharge date: 02/22/2017  Admitted From: home  Disposition:  home   Recommendations for Outpatient Follow-up:  1. PCP to f/u on A1c/ diabetes and continue discussions about weight loss  Discharge Condition:  stable   CODE STATUS:  Full code   Consultations:  Neuro  nephro     Discharge Diagnoses:  Principal Problem:   Acute ischemic left MCA stroke ([place]) Active Problems:   OSA (obstructive sleep apnea)   Morbid obesity (HCC)   Essential hypertension, benign   Chronic kidney disease (CKD), stage IV (severe) (HCC)   TIA (transient ischemic attack)   Pressure injury of skin    Subjective: Complaints of cramps in right leg with dialysis.   Brief Summary: Blake Sauber Sr. is a 66 yo male with hx of CVA s/p residual left side weakness, ESRD on HD, HTN who was brought from HD center with cc of confusion/slurred speech/hypotension by the end of his HD session.  Hospital Course:  Principal Problem:   Acute ischemic left MCA stroke  MRI: Three total subcentimeter acute ischemic nonhemorrhagic infarcts involving the left parietal lobe, left splenium, and right frontal lobe as above. CTA: see report below- no stenosis A1c: 6.9 LDL 50, HDL 43 ECHO shows no source of thrombus/ embolus - Neuro recommendations: Aspirin 325mg , Plavix x 3 wks and then Plavix alone (note, Med rec mentions that he was already taking it but he can't remember if he was taking it) -  cont Lipitor - per speech therapy, his current speech deficits with expressive aphasia are his baseline - PT does not recommend any therapy  Active Problems: DM 2 - new - Hba1c 6.9 - nutrition consulted and education given-     OSA (obstructive sleep apnea) - does not use CPAP at home    Morbid obesity  Body mass index is 43.29 kg/m.    Essential hypertension,  benign - antihypertensives held in setting of acute CVA    Chronic kidney disease (CKD), stage IV (severe) (HCC) -   dialysis     Discharge Instructions  Discharge Instructions    Diet - low sodium heart healthy    Complete by:  As directed    Renal and diabetic diet   Increase activity slowly    Complete by:  As directed      Allergies as of 02/22/2017   No Known Allergies     Medication List    TAKE these medications   aspirin 325 MG tablet Take 1 tablet (325 mg total) by mouth every evening.   atorvastatin 80 MG tablet Commonly known as:  LIPITOR Take 1 tablet (80 mg total) by mouth daily at 6 PM. What changed:  See the new instructions.   cinacalcet 30 MG tablet Commonly known as:  SENSIPAR Take 1 tablet (30 mg total) by mouth Every Tuesday,Thursday,and Saturday with dialysis.   clopidogrel 75 MG tablet Commonly known as:  PLAVIX TAKE 75 mg TABLET BY MOUTH EVERY DAY WITH BREAKFAST   escitalopram 10 MG tablet Commonly known as:  LEXAPRO Take 1 tablet by mouth daily in evening What changed:  how much to take  how to take this  when to take this  additional instructions   FOSRENOL 1000 MG chewable tablet Generic drug:  lanthanum Chew 1 tablet by mouth 3 (three) times daily.   isosorbide mononitrate 120 MG 24 hr tablet Commonly  known as:  IMDUR TAKE 1 TABLET BY MOUTH DAILY.   multivitamin Tabs tablet Take 1 tablet by mouth daily.   senna-docusate 8.6-50 MG tablet Commonly known as:  Senokot-S Take 1 tablet by mouth at bedtime as needed for moderate constipation.            Discharge Care Instructions        Start     Ordered   02/25/17 0000  cinacalcet (SENSIPAR) 30 MG tablet  Every T-Th-Sa (Hemodialysis)     02/22/17 1533   02/22/17 0000  aspirin 325 MG tablet  Every evening     02/22/17 1533   02/22/17 0000  atorvastatin (LIPITOR) 80 MG tablet  Daily-1800     02/22/17 1533   02/22/17 0000  clopidogrel (PLAVIX) 75 MG tablet      02/22/17 1533   02/22/17 0000  senna-docusate (SENOKOT-S) 8.6-50 MG tablet  At bedtime PRN     02/22/17 1533   02/22/17 0000  Increase activity slowly     02/22/17 1533   02/22/17 0000  Diet - low sodium heart healthy    Comments:  Renal and diabetic diet   02/22/17 1533      No Known Allergies   Procedures/Studies: 2 D ECHO Study Conclusions  - Left ventricle: The cavity size was normal. There was severe   concentric hypertrophy. Systolic function was vigorous. The   estimated ejection fraction was in the range of 65% to 70%. Wall   motion was normal; there were no regional wall motion   abnormalities. Doppler parameters are consistent with abnormal   left ventricular relaxation (grade 1 diastolic dysfunction). - Aortic valve: Trileaflet; mildly thickened, mildly calcified   leaflets. Valve area (Vmax): 3.05 cm^2. - Mitral valve: Calcified annulus. Mildly thickened leaflets .   Valve area by continuity equation (using LVOT flow): 3.66 cm^2. - Left atrium: The atrium was mildly dilated.  Ct Angio Head W Or Wo Contrast  Result Date: 02/20/2017 CLINICAL DATA:  Initial evaluation for acute speech difficulty, altered mental status. EXAM: CT ANGIOGRAPHY HEAD AND NECK CT PERFUSION BRAIN TECHNIQUE: Multidetector CT imaging of the head and neck was performed using the standard protocol during bolus administration of intravenous contrast. Multiplanar CT image reconstructions and MIPs were obtained to evaluate the vascular anatomy. Carotid stenosis measurements (when applicable) are obtained utilizing NASCET criteria, using the distal internal carotid diameter as the denominator. Multiphase CT imaging of the brain was performed following IV bolus contrast injection. Subsequent parametric perfusion maps were calculated using RAPID software. CONTRAST:  100 cc of Isovue 370. COMPARISON:  Prior noncontrast head CT from earlier the same day. FINDINGS: CTA NECK FINDINGS Aortic arch: Visualized aortic  arch of normal caliber with normal branch pattern. Atheromatous plaque within the arch itself. No flow-limiting stenosis about the origin of the great vessels. Visualized subclavian artery is widely patent. Right carotid system: Right carotid artery system mildly tortuous but patent without significant stenosis, dissection, or occlusion. Calcified plaque about the right carotid bifurcation without flow-limiting stenosis. Left carotid system: Left common carotid artery system tortuous but widely patent at the skullbase without stenosis, dissection, or occlusion. Atheromatous plaque about the left carotid bifurcation without flow-limiting stenosis. Vertebral arteries: Both of the vertebral arteries arise from the subclavian arteries. Right vertebral artery dominant. Multifocal atheromatous irregularity within the right vertebral artery without flow-limiting stenosis. Left vertebral artery diffusely hypoplastic. Vertebral artery's patent to the skullbase without stenosis or occlusion. Skeleton: No acute osseous abnormality. No worrisome lytic or  blastic osseous lesions. Moderate degenerate spondylolysis at C3-4 through C6-7. Other neck: No acute soft tissue abnormality within the neck. No adenopathy. Right-sided central venous catheter noted. Upper chest: No acute abnormality. Review of the MIP images confirms the above findings CTA HEAD FINDINGS Anterior circulation: Petrous segments patent bilaterally without stenosis. Calcified atheromatous plaque within the cavernous/supraclinoid ICAs without high-grade stenosis. ICA termini widely patent. A1 segments patent bilaterally. Anterior cerebral arteries patent to their distal aspects without stenosis. M1 segments patent without occlusion. Focal moderate proximal right M1 stenosis noted (series 13, image 95). No proximal M2 occlusion. Distal MCA branches well opacified and symmetric. Posterior circulation: Scattered plaque within the dominant right vertebral artery  without flow-limiting stenosis. Hypoplastic left vertebral artery patent as well. Posterior inferior cerebral arteries patent bilaterally. Basilar artery tortuous but patent to its distal aspect without flow-limiting stenosis. Superior cerebral arteries patent bilaterally. Fetal type origin of the right PCA supplied via a patent right posterior communicating artery. Left PCA patent to its distal aspect without flow-limiting stenosis. Venous sinuses: Not well evaluated due to arterial timing of the contrast bolus. Anatomic variants: Fetal type right PCA. No aneurysm or vascular malformation. Delayed phase: Not performed. Review of the MIP images confirms the above findings CT Brain Perfusion Findings: CBF (<30%) Volume: CRLmL Perfusion (Tmax>6.0s) volume: 31mL Mismatch Volume: 0 roundmL Infarction Location:No acute infarct. IMPRESSION: 1. Negative CT perfusion.  No acute infarct. 2. Negative CTA for emergent large vessel occlusion. 3. Scattered atheromatous disease involving the major arterial vasculature of the head and neck as above. Most notable finding consists of 80 focal moderate proximal right M1 stenosis. No other high-grade or correctable stenosis identified. 4. Diffuse vessel tortuosity, suggesting chronic underlying hypertension. Results were paged at the time of interpretation on 02/20/2017 at 6:12 pm to Dr. Amie Portland who immediately called back, results were conveyed via telephone. Electronically Signed   By: Jeannine Boga M.D.   On: 02/20/2017 18:27   Ct Angio Neck W Or Wo Contrast  Result Date: 02/20/2017 CLINICAL DATA:  Initial evaluation for acute speech difficulty, altered mental status. EXAM: CT ANGIOGRAPHY HEAD AND NECK CT PERFUSION BRAIN TECHNIQUE: Multidetector CT imaging of the head and neck was performed using the standard protocol during bolus administration of intravenous contrast. Multiplanar CT image reconstructions and MIPs were obtained to evaluate the vascular anatomy. Carotid  stenosis measurements (when applicable) are obtained utilizing NASCET criteria, using the distal internal carotid diameter as the denominator. Multiphase CT imaging of the brain was performed following IV bolus contrast injection. Subsequent parametric perfusion maps were calculated using RAPID software. CONTRAST:  100 cc of Isovue 370. COMPARISON:  Prior noncontrast head CT from earlier the same day. FINDINGS: CTA NECK FINDINGS Aortic arch: Visualized aortic arch of normal caliber with normal branch pattern. Atheromatous plaque within the arch itself. No flow-limiting stenosis about the origin of the great vessels. Visualized subclavian artery is widely patent. Right carotid system: Right carotid artery system mildly tortuous but patent without significant stenosis, dissection, or occlusion. Calcified plaque about the right carotid bifurcation without flow-limiting stenosis. Left carotid system: Left common carotid artery system tortuous but widely patent at the skullbase without stenosis, dissection, or occlusion. Atheromatous plaque about the left carotid bifurcation without flow-limiting stenosis. Vertebral arteries: Both of the vertebral arteries arise from the subclavian arteries. Right vertebral artery dominant. Multifocal atheromatous irregularity within the right vertebral artery without flow-limiting stenosis. Left vertebral artery diffusely hypoplastic. Vertebral artery's patent to the skullbase without stenosis or occlusion. Skeleton: No  acute osseous abnormality. No worrisome lytic or blastic osseous lesions. Moderate degenerate spondylolysis at C3-4 through C6-7. Other neck: No acute soft tissue abnormality within the neck. No adenopathy. Right-sided central venous catheter noted. Upper chest: No acute abnormality. Review of the MIP images confirms the above findings CTA HEAD FINDINGS Anterior circulation: Petrous segments patent bilaterally without stenosis. Calcified atheromatous plaque within the  cavernous/supraclinoid ICAs without high-grade stenosis. ICA termini widely patent. A1 segments patent bilaterally. Anterior cerebral arteries patent to their distal aspects without stenosis. M1 segments patent without occlusion. Focal moderate proximal right M1 stenosis noted (series 13, image 95). No proximal M2 occlusion. Distal MCA branches well opacified and symmetric. Posterior circulation: Scattered plaque within the dominant right vertebral artery without flow-limiting stenosis. Hypoplastic left vertebral artery patent as well. Posterior inferior cerebral arteries patent bilaterally. Basilar artery tortuous but patent to its distal aspect without flow-limiting stenosis. Superior cerebral arteries patent bilaterally. Fetal type origin of the right PCA supplied via a patent right posterior communicating artery. Left PCA patent to its distal aspect without flow-limiting stenosis. Venous sinuses: Not well evaluated due to arterial timing of the contrast bolus. Anatomic variants: Fetal type right PCA. No aneurysm or vascular malformation. Delayed phase: Not performed. Review of the MIP images confirms the above findings CT Brain Perfusion Findings: CBF (<30%) Volume: CRLmL Perfusion (Tmax>6.0s) volume: 47mL Mismatch Volume: 0 roundmL Infarction Location:No acute infarct. IMPRESSION: 1. Negative CT perfusion.  No acute infarct. 2. Negative CTA for emergent large vessel occlusion. 3. Scattered atheromatous disease involving the major arterial vasculature of the head and neck as above. Most notable finding consists of 80 focal moderate proximal right M1 stenosis. No other high-grade or correctable stenosis identified. 4. Diffuse vessel tortuosity, suggesting chronic underlying hypertension. Results were paged at the time of interpretation on 02/20/2017 at 6:12 pm to Dr. Amie Portland who immediately called back, results were conveyed via telephone. Electronically Signed   By: Jeannine Boga M.D.   On: 02/20/2017  18:27   [name]  Result Date: 02/20/2017 CLINICAL DATA:  Initial evaluation for acute confusion, slurred speech. EXAM: MRI HEAD WITHOUT CONTRAST TECHNIQUE: Multiplanar, multiecho pulse sequences of the brain and surrounding structures were obtained without intravenous contrast. COMPARISON:  Prior CTA from earlier same day as well as previous MRI from 12/03/2014. FINDINGS: Brain: Study moderately degraded by motion artifact. Diffuse prominence of the CSF containing spaces compatible with generalized cerebral atrophy. Patchy and confluent T2/FLAIR hyperintensity within the periventricular and deep white matter both cerebral hemispheres most consistent with chronic microvascular ischemic disease, moderate nature. Associated remote lacunar infarcts present within the left basal ganglia/corona radiata out. Remote hemorrhagic left thalamic lacunar infarct. Remote lacunar infarcts within the pons. Probable few scattered tiny remote bilateral cerebellar infarcts noted as well. Punctate 4 mm left parietal cortical infarct (series 3, image 36). 8 mm left splenium all infarct (series 3, image 33) 5 mm subcortical right frontal infarct (series 3, image 34). Few additional probable late subacute subcentimeter infarcts noted at the bilateral centrum semi ovale. No associated hemorrhage or mass effect. No mass lesion, midline shift or mass effect. No hydrocephalus. No extra-axial fluid collection. Major dural sinuses patent. Vascular: Partially abnormal flow void within the hypoplastic left vertebral artery, consistent with previously seen atheromatous disease on prior CTA. Major intracranial vascular flow voids otherwise maintained. Tortuous basilar artery invaginate CED upon the brainstem. Skull and upper cervical spine: Craniocervical junction normal. Multilevel degenerative spondylolysis noted within the upper cervical spine without significant stenosis.  Bone marrow signal intensity normal. No scalp soft tissue  abnormality. Sinuses/Orbits: Globes normal soft tissues within normal limits. Paranasal sinuses clear. Trace right mastoid effusion. Inner ear structures normal. Other: None. IMPRESSION: 1. Three total subcentimeter acute ischemic nonhemorrhagic infarcts involving the left parietal lobe, left splenium, and right frontal lobe as above. Findings are likely embolic in nature. 2. Remote left thalamic, pontine, and bilateral cerebellar infarcts. 3. Moderate chronic microvascular ischemic disease. Electronically Signed   By: Jeannine Boga M.D.   On: 02/20/2017 21:13   Ct Cerebral Perfusion W Contrast  Result Date: 02/20/2017 CLINICAL DATA:  Initial evaluation for acute speech difficulty, altered mental status. EXAM: CT ANGIOGRAPHY HEAD AND NECK CT PERFUSION BRAIN TECHNIQUE: Multidetector CT imaging of the head and neck was performed using the standard protocol during bolus administration of intravenous contrast. Multiplanar CT image reconstructions and MIPs were obtained to evaluate the vascular anatomy. Carotid stenosis measurements (when applicable) are obtained utilizing NASCET criteria, using the distal internal carotid diameter as the denominator. Multiphase CT imaging of the brain was performed following IV bolus contrast injection. Subsequent parametric perfusion maps were calculated using RAPID software. CONTRAST:  100 cc of Isovue 370. COMPARISON:  Prior noncontrast head CT from earlier the same day. FINDINGS: CTA NECK FINDINGS Aortic arch: Visualized aortic arch of normal caliber with normal branch pattern. Atheromatous plaque within the arch itself. No flow-limiting stenosis about the origin of the great vessels. Visualized subclavian artery is widely patent. Right carotid system: Right carotid artery system mildly tortuous but patent without significant stenosis, dissection, or occlusion. Calcified plaque about the right carotid bifurcation without flow-limiting stenosis. Left carotid system: Left  common carotid artery system tortuous but widely patent at the skullbase without stenosis, dissection, or occlusion. Atheromatous plaque about the left carotid bifurcation without flow-limiting stenosis. Vertebral arteries: Both of the vertebral arteries arise from the subclavian arteries. Right vertebral artery dominant. Multifocal atheromatous irregularity within the right vertebral artery without flow-limiting stenosis. Left vertebral artery diffusely hypoplastic. Vertebral artery's patent to the skullbase without stenosis or occlusion. Skeleton: No acute osseous abnormality. No worrisome lytic or blastic osseous lesions. Moderate degenerate spondylolysis at C3-4 through C6-7. Other neck: No acute soft tissue abnormality within the neck. No adenopathy. Right-sided central venous catheter noted. Upper chest: No acute abnormality. Review of the MIP images confirms the above findings CTA HEAD FINDINGS Anterior circulation: Petrous segments patent bilaterally without stenosis. Calcified atheromatous plaque within the cavernous/supraclinoid ICAs without high-grade stenosis. ICA termini widely patent. A1 segments patent bilaterally. Anterior cerebral arteries patent to their distal aspects without stenosis. M1 segments patent without occlusion. Focal moderate proximal right M1 stenosis noted (series 13, image 95). No proximal M2 occlusion. Distal MCA branches well opacified and symmetric. Posterior circulation: Scattered plaque within the dominant right vertebral artery without flow-limiting stenosis. Hypoplastic left vertebral artery patent as well. Posterior inferior cerebral arteries patent bilaterally. Basilar artery tortuous but patent to its distal aspect without flow-limiting stenosis. Superior cerebral arteries patent bilaterally. Fetal type origin of the right PCA supplied via a patent right posterior communicating artery. Left PCA patent to its distal aspect without flow-limiting stenosis. Venous sinuses: Not  well evaluated due to arterial timing of the contrast bolus. Anatomic variants: Fetal type right PCA. No aneurysm or vascular malformation. Delayed phase: Not performed. Review of the MIP images confirms the above findings CT Brain Perfusion Findings: CBF (<30%) Volume: CRLmL Perfusion (Tmax>6.0s) volume: 71mL Mismatch Volume: 0 roundmL Infarction Location:No acute infarct. IMPRESSION: 1. Negative CT perfusion.  No acute infarct. 2. Negative CTA for emergent large vessel occlusion. 3. Scattered atheromatous disease involving the major arterial vasculature of the head and neck as above. Most notable finding consists of 80 focal moderate proximal right M1 stenosis. No other high-grade or correctable stenosis identified. 4. Diffuse vessel tortuosity, suggesting chronic underlying hypertension. Results were paged at the time of interpretation on 02/20/2017 at 6:12 pm to Dr. Amie Portland who immediately called back, results were conveyed via telephone. Electronically Signed   By: Jeannine Boga M.D.   On: 02/20/2017 18:27   Dg Chest Port 1 View  Result Date: 01/29/2017 CLINICAL DATA:  Confusion.  Shortness of breath. EXAM: PORTABLE CHEST 1 VIEW COMPARISON:  05/18/2015. FINDINGS: Right IJ dialysis catheter noted. Tip is over the superior vena cava. Mild cardiomegaly. No pulmonary venous congestion. Low lung volumes. No pleural effusion or pneumothorax. IMPRESSION: Right IJ dialysis catheter with tip over superior vena cava. No pneumothorax. 2. Mild cardiomegaly.  No pulmonary venous congestion. 3. Low lung volumes with mild basilar atelectasis . Electronically Signed   By: Marcello Moores  Register   On: 01/29/2017 12:57   Ct Head Code Stroke Wo Contrast  Result Date: 02/20/2017 CLINICAL DATA:  Code stroke. Initial evaluation for acute altered mental status. EXAM: CT HEAD WITHOUT CONTRAST TECHNIQUE: Contiguous axial images were obtained from the base of the skull through the vertex without intravenous contrast.  COMPARISON:  Prior MRI from 12/02/2014. FINDINGS: Brain: Generalized age related cerebral atrophy with moderate chronic small vessel ischemic disease. No acute intracranial hemorrhage. No evidence for acute large vessel territory infarct. No mass lesion, midline shift or mass effect. No hydrocephalus. No extra-axial fluid collection. Vascular: No asymmetric hyperdense vessel. Extensive intracranial atherosclerosis. Skull: Scalp soft tissues within normal limits.  Calvarium intact. Sinuses/Orbits: Globes and orbital soft tissues within normal limits. Paranasal sinuses are clear. No mastoid effusion. Other: None. ASPECTS [facility] Stroke Program Early CT Score) - Ganglionic level infarction (caudate, lentiform nuclei, internal capsule, insula, M1-M3 cortex): 7 - Supraganglionic infarction (M4-M6 cortex): 3 Total score (0-10 with 10 being normal): 10 IMPRESSION: 1. No acute intracranial infarct or other process identified. 2. ASPECTS is 10 3. Moderate cerebral atrophy with chronic small vessel ischemic disease. Critical Value/emergent results were called by telephone at the time of interpretation on 02/20/2017 at 5:48 pm to Dr. Marda Stalker , who verbally acknowledged these results. Results also paged to Dr. Rory Percy of the Stroke Neurology service via secure page at 5:53 p.m. on 02/20/2017. Electronically Signed   By: Jeannine Boga M.D.   On: 02/20/2017 17:54       Discharge Exam: Vitals:   02/22/17 1200 02/22/17 1244  BP: 104/84 (!) 150/93  Pulse: 72 83  Resp:  (!) 21  Temp:  98.6 F (37 C)  SpO2:  96%   Vitals:   02/22/17 1100 02/22/17 1130 02/22/17 1200 02/22/17 1244  BP: (!) 135/108 (!) 148/81 104/84 (!) 150/93  Pulse: 87 88 72 83  Resp:    (!) 21  Temp:    98.6 F (37 C)  TempSrc:    Oral  SpO2:    96%  Weight:    129.7 kg (285 lb 15 oz)  Height:        General: Pt is alert, awake, not in acute distress Cardiovascular: RRR, S1/S2 +, no rubs, no gallops Respiratory: CTA  bilaterally, no wheezing, no rhonchi Abdominal: Soft, NT, ND, bowel sounds + Extremities: no edema, no cyanosis    The results of significant diagnostics  from this hospitalization (including imaging, microbiology, ancillary and laboratory) are listed below for reference.     Microbiology: No results found for this or any previous visit (from the past 240 hour(s)).   Labs: BNP (last 3 results) No results for input(s): BNP in the last 8760 hours. Basic Metabolic Panel:  Recent Labs Lab 02/20/17 1742 02/20/17 1750 02/22/17 0817  NA 137 139 136  K 2.8* 2.9* 3.8  CL 101 98* 98*  CO2 26  --  27  GLUCOSE 177* 179* 126*  BUN 25* 26* 51*  CREATININE 3.76* 3.90* 6.79*  CALCIUM 8.0*  --  9.4  PHOS  --   --  6.5*   Liver Function Tests:  Recent Labs Lab 02/20/17 1742 02/22/17 0817  AST 17  --   ALT 17  --   ALKPHOS 70  --   BILITOT 0.3  --   PROT 6.9  --   ALBUMIN 3.0* 3.0*   No results for input(s): LIPASE, AMYLASE in the last 168 hours.  Recent Labs Lab 02/20/17 1955  AMMONIA 27   CBC:  Recent Labs Lab 02/20/17 1742 02/20/17 1750 02/22/17 0818  WBC 11.3*  --  10.2  NEUTROABS 7.0  --   --   HGB 11.0* 12.2* 11.4*  HCT 33.8* 36.0* 34.6*  MCV 94.9  --  95.1  PLT 289  --  318   Cardiac Enzymes: No results for input(s): CKTOTAL, CKMB, CKMBINDEX, TROPONINI in the last 168 hours. BNP: Invalid input(s): POCBNP CBG:  Recent Labs Lab 02/20/17 1853 02/21/17 0635 02/21/17 1144  GLUCAP 147* 116* 129*   D-Dimer No results for input(s): DDIMER in the last 72 hours. Hgb A1c  Recent Labs  02/21/17 0604  HGBA1C 6.9*   Lipid Profile  Recent Labs  02/21/17 0604  CHOL 112  HDL 43  LDLCALC 50  TRIG 95  CHOLHDL 2.6   Thyroid function studies  Recent Labs  02/20/17 1955  TSH 2.189   Anemia work up  Recent Labs  02/20/17 1955  VITAMINB12 456   Urinalysis    Component Value Date/Time   COLORURINE YELLOW 05/29/2015 2130   APPEARANCEUR  CLEAR 05/29/2015 2130   LABSPEC 1.017 05/29/2015 2130   PHURINE 6.0 05/29/2015 2130   GLUCOSEU NEGATIVE 05/29/2015 2130   HGBUR TRACE (A) 05/29/2015 2130   BILIRUBINUR negative 07/09/2016 1500   KETONESUR negative 07/09/2016 1500   KETONESUR NEGATIVE 05/29/2015 2130   PROTEINUR >=300 (A) 07/09/2016 1500   PROTEINUR >300 (A) 05/29/2015 2130   UROBILINOGEN 0.2 07/09/2016 1500   UROBILINOGEN 0.2 01/13/2014 2036   NITRITE Negative 07/09/2016 1500   NITRITE NEGATIVE 05/29/2015 2130   LEUKOCYTESUR Negative 07/09/2016 1500   Sepsis Labs Invalid input(s): PROCALCITONIN,  WBC,  LACTICIDVEN Microbiology No results found for this or any previous visit (from the past 240 hour(s)).   Time coordinating discharge: Over 30 minutes  SIGNED:   Debbe Odea, MD  [facility] 02/22/2017, 3:36 PM Pager   If 7PM-7AM, please contact night-coverage [email or web address] Password [email or web address]

## 2017-02-22 NOTE — Discharge Instructions (Signed)
You have a new diagnosis of diabetes. You should try to lose weight as this will help your diabetes get better.  Diet Recommendations 1. Refrain from drinking sugary beverages 2. Follow plate method: 53-64% of plate vegetables, 68-03% protein and 25% carb 3. Read nutritional labels, choose foods high in protein and low in carb/added sugar 4. Aim for 60-80 g carb per meal.  RD provided "Carbohydrate Counting for People with Diabetes" handout from the [employer]. Discussed different food groups and their effects on blood sugar, emphasizing carbohydrate-containing foods. Provided list of carbohydrates and recommended serving sizes of common foods.

## 2017-02-22 NOTE — Consult Note (Signed)
[facility] Renal Consultation Note    Indication for Consultation:  Management of ESRD/hemodialysis; anemia, hypertension/volume and secondary hyperparathyroidism PCP: Dr. [name]  HPI: Blake Aguinaldo Sr. is a 66 y.o. male with ESRD on hemodialysis T,Th,S at [facility]. PMH of CVA with residual L sided weakness, HTN, morbid obesity, DMT2, Systolic HF,  OSA on CPAP, AOCD, SHPT.   Patient was brought to ED 02/20/17 post dialysis with confusion, slurred speech, hypotension and hypokalemia. Initial CT was negative but MRI revealed Three total subcentimeter acute ischemic nonhemorrhagic infarcts involving the left parietal lobe, left splenium, and right frontal lobe. Infarcts thought to be embolic in nature. He was admitted by primary for CVA and is being followed by neurology. Currently he is being seen on hemodialysis. He denies C/Os pain/SOB. He says he feels fine. Speech is slightly slurred, grips slightly weaker on R side otherwise appears to be back to base line. He denies fever, chill, N,V,D, chest pain, abdominal pain, flank pain, bloody or tarry stools. He is dialyzing via [facility] [place] and is stable at present.   K+ on admission was 2.8. He received one time dose of 20 MEQ KCL with repeat K+2.9. No further labs or potassium supplements have been ordered for patient.   Past Medical History:  Diagnosis Date  . CVA (cerebral vascular accident) [facility]) 12/2013   admitted in July 2015 for acute right-sided infarct/notes 12/02/2014- 01/28/17-  uses walker   . ESRD (end stage renal disease) ([place])    Hemo TTHSat  . Hypercholesterolemia   . Hypertension   . Multiple wounds    on lower legs - sees [name] Pain at the [place]  . OSA on CPAP    uses cpap, setting of 5, 01/28/17- does not use any longer  . 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 Stroke ([place]) 12/02/2014   expressive aphasia 01/28/17- sometimes  . Systolic CHF ([place])   . Type II  diabetes mellitus ([place])    uncontrolled/notes 12/02/2014   Past Surgical History:  Procedure Laterality Date  . A/V FISTULAGRAM N/A 01/17/2017   Procedure: A/V Fistulagram - Left arm;  Surgeon: Elam Dutch, MD;  Location: [facility];  Service: Cardiovascular;  Laterality: N/A;  . AV FISTULA PLACEMENT Left 09/27/2015   Procedure: RADIOCEPHALIC ARTERIOVENOUS (AV) FISTULA CREATION LEFT ARM;  Surgeon: Mal Misty, MD;  Location: [place];  Service: Vascular;  Laterality: Left;  . AV FISTULA PLACEMENT Left 01/29/2017   Procedure: ARTERIOVENOUS (AV) FISTULA CREATION BRACHIOCEPHALIC;  Surgeon: Elam Dutch, MD;  Location: [place];  Service: Vascular;  Laterality: Left;  . COLONOSCOPY  6-7 yrs ago  . COLONOSCOPY N/A 04/05/2013   Procedure: COLONOSCOPY;  Surgeon: Juanita Craver, MD;  Location: [facility];  Service: Endoscopy;  Laterality: N/A;  . LIGATION OF ARTERIOVENOUS  FISTULA Left 01/29/2017   Procedure: LIGATION OF RADIOCEPHALIC ARTERIOVENOUS  FISTULA;  Surgeon: Elam Dutch, MD;  Location: [facility];  Service: Vascular;  Laterality: Left;   Family History  Problem Relation Age of Onset  . Diabetes Mother   . Hypertension Mother   . Diabetes Father   . Hypertension Father   . Asthma Son   . Diabetes Sister   . Diabetes Brother    Social History:  reports that he quit smoking about 46 years ago. His smoking use included Cigarettes. He has a 0.50 pack-year smoking history. He has never used smokeless tobacco. He reports that he uses  drugs, including Marijuana. He reports that he does not drink alcohol. No Known Allergies Prior to Admission medications   Medication Sig Start Date End Date Taking? Authorizing Provider  aspirin 325 MG tablet Take 325 mg by mouth every evening.    Yes [provider]  atorvastatin (LIPITOR) 80 MG tablet TAKE 1 TABLET AT 6PM. 01/13/17  Yes Shawnee Knapp, MD  clopidogrel (PLAVIX) 75 MG tablet TAKE 1 TABLET BY MOUTH EVERY DAY WITH  BREAKFAST Patient taking differently: TAKE 75 mg TABLET BY MOUTH EVERY DAY WITH BREAKFAST 09/28/16  Yes Shawnee Knapp, MD  escitalopram (LEXAPRO) 10 MG tablet Take 1 tablet by mouth daily in evening Patient taking differently: Take 10 mg by mouth at bedtime.  11/05/16  Yes Shawnee Knapp, MD  isosorbide mononitrate (IMDUR) 120 MG 24 hr tablet TAKE 1 TABLET BY MOUTH DAILY. 01/02/17  Yes Shawnee Knapp, MD  lanthanum (FOSRENOL) 1000 MG chewable tablet Chew 1 tablet by mouth 3 (three) times daily.    Yes [provider]  multivitamin (RENA-VIT) TABS tablet Take 1 tablet by mouth daily.   Yes [provider]   Current Facility-Administered Medications  Medication Dose Route Frequency Provider Last Rate Last Dose  . 0.9 %  sodium chloride infusion  100 mL Intravenous PRN Valentina Gu, NP      . 0.9 %  sodium chloride infusion  100 mL Intravenous PRN Valentina Gu, NP      . alteplase (CATHFLO ACTIVASE) injection 2 mg  2 mg Intracatheter Once PRN Valentina Gu, NP      . aspirin tablet 325 mg  325 mg Oral QPM Gennaro Africa, MD   325 mg at 02/21/17 1657  . atorvastatin (LIPITOR) tablet 80 mg  80 mg Oral q1800 Gennaro Africa, MD   80 mg at 02/21/17 1657  . cinacalcet (SENSIPAR) tablet 30 mg  30 mg Oral Q T,Th,Sa-HD Valentina Gu, NP      . clopidogrel (PLAVIX) tablet 75 mg  75 mg Oral Daily Gennaro Africa, MD   75 mg at 02/21/17 1030  . escitalopram (LEXAPRO) tablet 10 mg  10 mg Oral QHS Gennaro Africa, MD   10 mg at 02/21/17 2200  . feeding supplement (NEPRO CARB STEADY) liquid 237 mL  237 mL Oral BID BM Valentina Gu, NP      . heparin injection 1,000 Units  1,000 Units Dialysis PRN Valentina Gu, NP      . heparin injection 2,700 Units  20 Units/kg Dialysis PRN Valentina Gu, NP      . heparin injection 5,000 Units  5,000 Units Subcutaneous Q8H Gennaro Africa, MD   5,000 Units at 02/22/17 0600  . lanthanum (FOSRENOL) chewable tablet 1,000 mg  1,000 mg  Oral TID WC Gennaro Africa, MD   1,000 mg at 02/21/17 1657  . lidocaine (PF) (XYLOCAINE) 1 % injection 5 mL  5 mL Intradermal PRN Valentina Gu, NP      . lidocaine-prilocaine (EMLA) cream 1 application  1 application Topical PRN Valentina Gu, NP      . multivitamin (RENA-VIT) tablet 1 tablet  1 tablet Oral Daily Gennaro Africa, MD   1 tablet at 02/21/17 1030  . pentafluoroprop-tetrafluoroeth (GEBAUERS) aerosol 1 application  1 application Topical PRN Valentina Gu, NP      . senna-docusate (Senokot-S) tablet 1 tablet  1 tablet Oral QHS PRN Gennaro Africa, MD       Labs: Basic  Metabolic Panel:  Recent Labs Lab 02/20/17 1742 02/20/17 1750  NA 137 139  K 2.8* 2.9*  CL 101 98*  CO2 26  --   GLUCOSE 177* 179*  BUN 25* 26*  CREATININE 3.76* 3.90*  CALCIUM 8.0*  --    Liver Function Tests:  Recent Labs Lab 02/20/17 1742  AST 17  ALT 17  ALKPHOS 70  BILITOT 0.3  PROT 6.9  ALBUMIN 3.0*   No results for input(s): LIPASE, AMYLASE in the last 168 hours.  Recent Labs Lab 02/20/17 1955  AMMONIA 27   CBC:  Recent Labs Lab 02/20/17 1742 02/20/17 1750  WBC 11.3*  --   NEUTROABS 7.0  --   HGB 11.0* 12.2*  HCT 33.8* 36.0*  MCV 94.9  --   PLT 289  --    Cardiac Enzymes: No results for input(s): CKTOTAL, CKMB, CKMBINDEX, TROPONINI in the last 168 hours. CBG:  Recent Labs Lab 02/20/17 1853 02/21/17 0635 02/21/17 1144  GLUCAP 147* 116* 129*   Iron Studies: No results for input(s): IRON, TIBC, TRANSFERRIN, FERRITIN in the last 72 hours. Studies/Results: Ct Angio Head W Or Wo Contrast  Result Date: 02/20/2017 CLINICAL DATA:  Initial evaluation for acute speech difficulty, altered mental status. EXAM: CT ANGIOGRAPHY HEAD AND NECK CT PERFUSION BRAIN TECHNIQUE: Multidetector CT imaging of the head and neck was performed using the standard protocol during bolus administration of intravenous contrast. Multiplanar CT image reconstructions and MIPs were obtained  to evaluate the vascular anatomy. Carotid stenosis measurements (when applicable) are obtained utilizing NASCET criteria, using the distal internal carotid diameter as the denominator. Multiphase CT imaging of the brain was performed following IV bolus contrast injection. Subsequent parametric perfusion maps were calculated using RAPID software. CONTRAST:  100 cc of Isovue 370. COMPARISON:  Prior noncontrast head CT from earlier the same day. FINDINGS: CTA NECK FINDINGS Aortic arch: Visualized aortic arch of normal caliber with normal branch pattern. Atheromatous plaque within the arch itself. No flow-limiting stenosis about the origin of the great vessels. Visualized subclavian artery is widely patent. Right carotid system: Right carotid artery system mildly tortuous but patent without significant stenosis, dissection, or occlusion. Calcified plaque about the right carotid bifurcation without flow-limiting stenosis. Left carotid system: Left common carotid artery system tortuous but widely patent at the skullbase without stenosis, dissection, or occlusion. Atheromatous plaque about the left carotid bifurcation without flow-limiting stenosis. Vertebral arteries: Both of the vertebral arteries arise from the subclavian arteries. Right vertebral artery dominant. Multifocal atheromatous irregularity within the right vertebral artery without flow-limiting stenosis. Left vertebral artery diffusely hypoplastic. Vertebral artery's patent to the skullbase without stenosis or occlusion. Skeleton: No acute osseous abnormality. No worrisome lytic or blastic osseous lesions. Moderate degenerate spondylolysis at C3-4 through C6-7. Other neck: No acute soft tissue abnormality within the neck. No adenopathy. Right-sided central venous catheter noted. Upper chest: No acute abnormality. Review of the MIP images confirms the above findings CTA HEAD FINDINGS Anterior circulation: Petrous segments patent bilaterally without stenosis.  Calcified atheromatous plaque within the cavernous/supraclinoid ICAs without high-grade stenosis. ICA termini widely patent. A1 segments patent bilaterally. Anterior cerebral arteries patent to their distal aspects without stenosis. M1 segments patent without occlusion. Focal moderate proximal right M1 stenosis noted (series 13, image 95). No proximal M2 occlusion. Distal MCA branches well opacified and symmetric. Posterior circulation: Scattered plaque within the dominant right vertebral artery without flow-limiting stenosis. Hypoplastic left vertebral artery patent as well. Posterior inferior cerebral arteries patent bilaterally. Basilar artery tortuous  but patent to its distal aspect without flow-limiting stenosis. Superior cerebral arteries patent bilaterally. Fetal type origin of the right PCA supplied via a patent right posterior communicating artery. Left PCA patent to its distal aspect without flow-limiting stenosis. Venous sinuses: Not well evaluated due to arterial timing of the contrast bolus. Anatomic variants: Fetal type right PCA. No aneurysm or vascular malformation. Delayed phase: Not performed. Review of the MIP images confirms the above findings CT Brain Perfusion Findings: CBF (<30%) Volume: CRLmL Perfusion (Tmax>6.0s) volume: 1mL Mismatch Volume: 0 roundmL Infarction Location:No acute infarct. IMPRESSION: 1. Negative CT perfusion.  No acute infarct. 2. Negative CTA for emergent large vessel occlusion. 3. Scattered atheromatous disease involving the major arterial vasculature of the head and neck as above. Most notable finding consists of 80 focal moderate proximal right M1 stenosis. No other high-grade or correctable stenosis identified. 4. Diffuse vessel tortuosity, suggesting chronic underlying hypertension. Results were paged at the time of interpretation on 02/20/2017 at 6:12 pm to Dr. Amie Portland who immediately called back, results were conveyed via telephone. Electronically Signed   By:  Jeannine Boga M.D.   On: 02/20/2017 18:27   Ct Angio Neck W Or Wo Contrast  Result Date: 02/20/2017 CLINICAL DATA:  Initial evaluation for acute speech difficulty, altered mental status. EXAM: CT ANGIOGRAPHY HEAD AND NECK CT PERFUSION BRAIN TECHNIQUE: Multidetector CT imaging of the head and neck was performed using the standard protocol during bolus administration of intravenous contrast. Multiplanar CT image reconstructions and MIPs were obtained to evaluate the vascular anatomy. Carotid stenosis measurements (when applicable) are obtained utilizing NASCET criteria, using the distal internal carotid diameter as the denominator. Multiphase CT imaging of the brain was performed following IV bolus contrast injection. Subsequent parametric perfusion maps were calculated using RAPID software. CONTRAST:  100 cc of Isovue 370. COMPARISON:  Prior noncontrast head CT from earlier the same day. FINDINGS: CTA NECK FINDINGS Aortic arch: Visualized aortic arch of normal caliber with normal branch pattern. Atheromatous plaque within the arch itself. No flow-limiting stenosis about the origin of the great vessels. Visualized subclavian artery is widely patent. Right carotid system: Right carotid artery system mildly tortuous but patent without significant stenosis, dissection, or occlusion. Calcified plaque about the right carotid bifurcation without flow-limiting stenosis. Left carotid system: Left common carotid artery system tortuous but widely patent at the skullbase without stenosis, dissection, or occlusion. Atheromatous plaque about the left carotid bifurcation without flow-limiting stenosis. Vertebral arteries: Both of the vertebral arteries arise from the subclavian arteries. Right vertebral artery dominant. Multifocal atheromatous irregularity within the right vertebral artery without flow-limiting stenosis. Left vertebral artery diffusely hypoplastic. Vertebral artery's patent to the skullbase without  stenosis or occlusion. Skeleton: No acute osseous abnormality. No worrisome lytic or blastic osseous lesions. Moderate degenerate spondylolysis at C3-4 through C6-7. Other neck: No acute soft tissue abnormality within the neck. No adenopathy. Right-sided central venous catheter noted. Upper chest: No acute abnormality. Review of the MIP images confirms the above findings CTA HEAD FINDINGS Anterior circulation: Petrous segments patent bilaterally without stenosis. Calcified atheromatous plaque within the cavernous/supraclinoid ICAs without high-grade stenosis. ICA termini widely patent. A1 segments patent bilaterally. Anterior cerebral arteries patent to their distal aspects without stenosis. M1 segments patent without occlusion. Focal moderate proximal right M1 stenosis noted (series 13, image 95). No proximal M2 occlusion. Distal MCA branches well opacified and symmetric. Posterior circulation: Scattered plaque within the dominant right vertebral artery without flow-limiting stenosis. Hypoplastic left vertebral artery patent as well. Posterior inferior  cerebral arteries patent bilaterally. Basilar artery tortuous but patent to its distal aspect without flow-limiting stenosis. Superior cerebral arteries patent bilaterally. Fetal type origin of the right PCA supplied via a patent right posterior communicating artery. Left PCA patent to its distal aspect without flow-limiting stenosis. Venous sinuses: Not well evaluated due to arterial timing of the contrast bolus. Anatomic variants: Fetal type right PCA. No aneurysm or vascular malformation. Delayed phase: Not performed. Review of the MIP images confirms the above findings CT Brain Perfusion Findings: CBF (<30%) Volume: CRLmL Perfusion (Tmax>6.0s) volume: 69mL Mismatch Volume: 0 roundmL Infarction Location:No acute infarct. IMPRESSION: 1. Negative CT perfusion.  No acute infarct. 2. Negative CTA for emergent large vessel occlusion. 3. Scattered atheromatous disease  involving the major arterial vasculature of the head and neck as above. Most notable finding consists of 80 focal moderate proximal right M1 stenosis. No other high-grade or correctable stenosis identified. 4. Diffuse vessel tortuosity, suggesting chronic underlying hypertension. Results were paged at the time of interpretation on 02/20/2017 at 6:12 pm to Dr. Amie Portland who immediately called back, results were conveyed via telephone. Electronically Signed   By: Jeannine Boga M.D.   On: 02/20/2017 18:27   [name]  Result Date: 02/20/2017 CLINICAL DATA:  Initial evaluation for acute confusion, slurred speech. EXAM: MRI HEAD WITHOUT CONTRAST TECHNIQUE: Multiplanar, multiecho pulse sequences of the brain and surrounding structures were obtained without intravenous contrast. COMPARISON:  Prior CTA from earlier same day as well as previous MRI from 12/03/2014. FINDINGS: Brain: Study moderately degraded by motion artifact. Diffuse prominence of the CSF containing spaces compatible with generalized cerebral atrophy. Patchy and confluent T2/FLAIR hyperintensity within the periventricular and deep white matter both cerebral hemispheres most consistent with chronic microvascular ischemic disease, moderate nature. Associated remote lacunar infarcts present within the left basal ganglia/corona radiata out. Remote hemorrhagic left thalamic lacunar infarct. Remote lacunar infarcts within the pons. Probable few scattered tiny remote bilateral cerebellar infarcts noted as well. Punctate 4 mm left parietal cortical infarct (series 3, image 36). 8 mm left splenium all infarct (series 3, image 33) 5 mm subcortical right frontal infarct (series 3, image 34). Few additional probable late subacute subcentimeter infarcts noted at the bilateral centrum semi ovale. No associated hemorrhage or mass effect. No mass lesion, midline shift or mass effect. No hydrocephalus. No extra-axial fluid collection. Major dural  sinuses patent. Vascular: Partially abnormal flow void within the hypoplastic left vertebral artery, consistent with previously seen atheromatous disease on prior CTA. Major intracranial vascular flow voids otherwise maintained. Tortuous basilar artery invaginate CED upon the brainstem. Skull and upper cervical spine: Craniocervical junction normal. Multilevel degenerative spondylolysis noted within the upper cervical spine without significant stenosis. Bone marrow signal intensity normal. No scalp soft tissue abnormality. Sinuses/Orbits: Globes normal soft tissues within normal limits. Paranasal sinuses clear. Trace right mastoid effusion. Inner ear structures normal. Other: None. IMPRESSION: 1. Three total subcentimeter acute ischemic nonhemorrhagic infarcts involving the left parietal lobe, left splenium, and right frontal lobe as above. Findings are likely embolic in nature. 2. Remote left thalamic, pontine, and bilateral cerebellar infarcts. 3. Moderate chronic microvascular ischemic disease. Electronically Signed   By: Jeannine Boga M.D.   On: 02/20/2017 21:13   Ct Cerebral Perfusion W Contrast  Result Date: 02/20/2017 CLINICAL DATA:  Initial evaluation for acute speech difficulty, altered mental status. EXAM: CT ANGIOGRAPHY HEAD AND NECK CT PERFUSION BRAIN TECHNIQUE: Multidetector CT imaging of the head and neck was performed using the standard protocol during  bolus administration of intravenous contrast. Multiplanar CT image reconstructions and MIPs were obtained to evaluate the vascular anatomy. Carotid stenosis measurements (when applicable) are obtained utilizing NASCET criteria, using the distal internal carotid diameter as the denominator. Multiphase CT imaging of the brain was performed following IV bolus contrast injection. Subsequent parametric perfusion maps were calculated using RAPID software. CONTRAST:  100 cc of Isovue 370. COMPARISON:  Prior noncontrast head CT from earlier the same  day. FINDINGS: CTA NECK FINDINGS Aortic arch: Visualized aortic arch of normal caliber with normal branch pattern. Atheromatous plaque within the arch itself. No flow-limiting stenosis about the origin of the great vessels. Visualized subclavian artery is widely patent. Right carotid system: Right carotid artery system mildly tortuous but patent without significant stenosis, dissection, or occlusion. Calcified plaque about the right carotid bifurcation without flow-limiting stenosis. Left carotid system: Left common carotid artery system tortuous but widely patent at the skullbase without stenosis, dissection, or occlusion. Atheromatous plaque about the left carotid bifurcation without flow-limiting stenosis. Vertebral arteries: Both of the vertebral arteries arise from the subclavian arteries. Right vertebral artery dominant. Multifocal atheromatous irregularity within the right vertebral artery without flow-limiting stenosis. Left vertebral artery diffusely hypoplastic. Vertebral artery's patent to the skullbase without stenosis or occlusion. Skeleton: No acute osseous abnormality. No worrisome lytic or blastic osseous lesions. Moderate degenerate spondylolysis at C3-4 through C6-7. Other neck: No acute soft tissue abnormality within the neck. No adenopathy. Right-sided central venous catheter noted. Upper chest: No acute abnormality. Review of the MIP images confirms the above findings CTA HEAD FINDINGS Anterior circulation: Petrous segments patent bilaterally without stenosis. Calcified atheromatous plaque within the cavernous/supraclinoid ICAs without high-grade stenosis. ICA termini widely patent. A1 segments patent bilaterally. Anterior cerebral arteries patent to their distal aspects without stenosis. M1 segments patent without occlusion. Focal moderate proximal right M1 stenosis noted (series 13, image 95). No proximal M2 occlusion. Distal MCA branches well opacified and symmetric. Posterior circulation:  Scattered plaque within the dominant right vertebral artery without flow-limiting stenosis. Hypoplastic left vertebral artery patent as well. Posterior inferior cerebral arteries patent bilaterally. Basilar artery tortuous but patent to its distal aspect without flow-limiting stenosis. Superior cerebral arteries patent bilaterally. Fetal type origin of the right PCA supplied via a patent right posterior communicating artery. Left PCA patent to its distal aspect without flow-limiting stenosis. Venous sinuses: Not well evaluated due to arterial timing of the contrast bolus. Anatomic variants: Fetal type right PCA. No aneurysm or vascular malformation. Delayed phase: Not performed. Review of the MIP images confirms the above findings CT Brain Perfusion Findings: CBF (<30%) Volume: CRLmL Perfusion (Tmax>6.0s) volume: 27mL Mismatch Volume: 0 roundmL Infarction Location:No acute infarct. IMPRESSION: 1. Negative CT perfusion.  No acute infarct. 2. Negative CTA for emergent large vessel occlusion. 3. Scattered atheromatous disease involving the major arterial vasculature of the head and neck as above. Most notable finding consists of 80 focal moderate proximal right M1 stenosis. No other high-grade or correctable stenosis identified. 4. Diffuse vessel tortuosity, suggesting chronic underlying hypertension. Results were paged at the time of interpretation on 02/20/2017 at 6:12 pm to Dr. Amie Portland who immediately called back, results were conveyed via telephone. Electronically Signed   By: Jeannine Boga M.D.   On: 02/20/2017 18:27   Ct Head Code Stroke Wo Contrast  Result Date: 02/20/2017 CLINICAL DATA:  Code stroke. Initial evaluation for acute altered mental status. EXAM: CT HEAD WITHOUT CONTRAST TECHNIQUE: Contiguous axial images were obtained from the base of the skull through the  vertex without intravenous contrast. COMPARISON:  Prior MRI from 12/02/2014. FINDINGS: Brain: Generalized age related cerebral  atrophy with moderate chronic small vessel ischemic disease. No acute intracranial hemorrhage. No evidence for acute large vessel territory infarct. No mass lesion, midline shift or mass effect. No hydrocephalus. No extra-axial fluid collection. Vascular: No asymmetric hyperdense vessel. Extensive intracranial atherosclerosis. Skull: Scalp soft tissues within normal limits.  Calvarium intact. Sinuses/Orbits: Globes and orbital soft tissues within normal limits. Paranasal sinuses are clear. No mastoid effusion. Other: None. ASPECTS [facility] Stroke Program Early CT Score) - Ganglionic level infarction (caudate, lentiform nuclei, internal capsule, insula, M1-M3 cortex): 7 - Supraganglionic infarction (M4-M6 cortex): 3 Total score (0-10 with 10 being normal): 10 IMPRESSION: 1. No acute intracranial infarct or other process identified. 2. ASPECTS is 10 3. Moderate cerebral atrophy with chronic small vessel ischemic disease. Critical Value/emergent results were called by telephone at the time of interpretation on 02/20/2017 at 5:48 pm to Dr. Marda Stalker , who verbally acknowledged these results. Results also paged to Dr. Rory Percy of the Stroke Neurology service via secure page at 5:53 p.m. on 02/20/2017. Electronically Signed   By: Jeannine Boga M.D.   On: 02/20/2017 17:54    ROS: As per HPI otherwise negative.   Physical Exam: Vitals:   02/22/17 0431 02/22/17 0805 02/22/17 0813 02/22/17 0818  BP: (!) 158/96 (!) 155/91 (!) 160/99 (!) 161/98  Pulse: 82 81 80 73  Resp: 20 (!) 27  19  Temp: (!) 97.5 F (36.4 C) 97.9 F (36.6 C)    TempSrc: Oral Oral    SpO2: 98% 95%    Weight: (!) 137.9 kg (304 lb 1.6 oz) 133.1 kg (293 lb 6.9 oz)    Height:         General: Well developed, well nourished, in no acute distress. Head: Normocephalic, atraumatic, sclera non-icteric, mucus membranes are moist Neck: Supple. JVD not elevated. Lungs: Clear bilaterally to auscultation without wheezes, rales, or  rhonchi. Breathing is unlabored. Heart: RRR with S1 S2. No murmurs, rubs, or gallops appreciated. SR on monitor.  Abdomen: Soft, non-tender, non-distended with normoactive bowel sounds. No rebound/guarding. No obvious abdominal masses. M-S:  Strength and tone appear normal for age. Lower extremities: Bilateral 1+ LE edema.  Neuro: Alert and oriented X 3. Moves all extremities spontaneously. Psych:  Responds to questions appropriately with a normal affect. Dialysis Access: RIJ TDC blood lines connected. L radiocephalic AVF No bruit. IV placed above AVF. Will remove.   Dialysis Orders: [facility] T,Th,S 4.5 hrs 180 NRe 450/800 138.5 kg 2.0 K/ 2.0 Ca  -Heparin 10000 units IV TIW -Sensipar 30 mg PO TIW  -Venofer 50 mg IV weekly   BMD meds: -Fosrenol 1000 mg PO TID AC (Last Ca 10.2 C Ca 10.4 Phos 6.4 01/23/17)   Assessment/Plan: 1.  Acute Ischemic L MCA CVA: Three total sub-centimeter acute ischemic infarcts parietal lob, left splenius and R frontal lobe. Started on DAPT. Per primary/neurology following.  2. Hypokalemia-K+ 3.8 today.  Run on 4.0 K bath.   3.  ESRD -  T,Th,S HD today on schedule. Tight heparin.  4.  Hypertension/volume  - BP slightly elevated-no antihypertensive meds on OP med list. Attempt UFG 3.5. Avoid hypotension.  5.  Anemia  - HGB 11.4. No ESA needed. Follow HGB.  6.  Metabolic bone disease -  Cont binders, sensipar. Labs pending 7.  Nutrition -Albumin 3.0. Renal/Carb mod diet. Add nepro/renal vit. 8. DM: per primary-note 09/07 mentions starting metformin.  NO METFORMIN  USED IN ESRD! No DM meds on OP med list.   Jimmye Norman. Owens Shark, NP-C 02/22/2017, 8:37 AM  [place]

## 2017-02-22 NOTE — Plan of Care (Signed)
Problem: Food- and Nutrition-Related Knowledge Deficit (NB-1.1) Goal: Nutrition education Formal process to instruct or train a patient/client in a skill or to impart knowledge to help patients/clients voluntarily manage or modify food choices and eating behavior to maintain or improve health. Outcome: Adequate for Discharge  RD consulted for nutrition education regarding new diagnosis of diabetes.   Lab Results  Component Value Date   HGBA1C 6.9 (H) 02/21/2017   Pt seen in dialysis. He is able to interact and seems to understand what RD is saying, however, he has noticeable difficulty with word finding and becomes quite frustrated by this. RD only asked simple yes/no questions.   He denies drinking soda or other sweetened beverages with any consistency. He does not eat many potatoes or other starches. He denies eating sweets or dense carbs such as rice/pasta. He says he does not eat much fruit. He says he does read nutrition labels. He says he follows the renal diet  RD verbalized basics of DM diet. Asked him to continue to avoid sugary beverages. We went over the plate method and recommended making 25-50% of his plate vegetables, 94-17% protein and 25% carbohydrate. These are the ideal ratios for glycemic control.   He is shown a picture of a nutritional label and asked to choose foods high in protein and low in added sugar/carb. RD estimated that he should consume 60- 80 g/carb per meal.   He communicated understanding of these recommendations. RD left handouts in his patient room for when he returns from HD  Summary of Recommendations 1. Refrain from drinking sugary beverages 2. Follow plate method: 40-81% of plate vegetables, 44-81% protein and 25% carb 3. Read nutritional labels, choose foods high in protein and low in carb/added sugar 4. Aim for 60-80 g carb per meal.  RD provided "Carbohydrate Counting for People with Diabetes" handout from the Academy of Nutrition and Dietetics.  Discussed different food groups and their effects on blood sugar, emphasizing carbohydrate-containing foods. Provided list of carbohydrates and recommended serving sizes of common foods.  Unable to predict level of compliance.  Body mass index is 42.1 kg/m. Pt meets criteria for morbid obesirt based on current BMI.  Current diet order is Renal w/ fluid restriction. There is no documented intake of meals at this time. Labs and medications reviewed. No further nutrition interventions warranted at this time. If additional nutrition issues arise, please re-consult RD.  Burtis Junes RD, LDN, CNSC Clinical Nutrition Pager: [phone] 02/22/2017 11:38 AM

## 2017-02-23 ENCOUNTER — Other Ambulatory Visit (HOSPITAL_COMMUNITY): Payer: Medicare Other

## 2017-02-23 ENCOUNTER — Inpatient Hospital Stay (HOSPITAL_COMMUNITY): Payer: Medicare (Managed Care)

## 2017-02-23 DIAGNOSIS — I63512 Cerebral infarction due to unspecified occlusion or stenosis of left middle cerebral artery: Secondary | ICD-10-CM

## 2017-02-23 DIAGNOSIS — R4789 Other speech disturbances: Secondary | ICD-10-CM

## 2017-02-23 LAB — ECHOCARDIOGRAM COMPLETE
HEIGHTINCHES: 70 in
WEIGHTICAEL: 4574.99 [oz_av]

## 2017-02-23 NOTE — Progress Notes (Signed)
  Echocardiogram 2D Echocardiogram has been performed.  Blake Bell [name] 02/23/2017, 10:08 AM

## 2017-02-23 NOTE — Progress Notes (Signed)
Patient given discharge instructions.  All questions and concerns addressed.  Patient left unit with all belongings in wheelchair accompanied by staff.

## 2017-02-23 NOTE — Progress Notes (Signed)
 [facility] Progress Note   Subjective:  No new complaints. Still with mild expressive aphasia.    Objective Vitals:   02/22/17 1838 02/22/17 2106 02/23/17 0110 02/23/17 0533  BP: 122/72 120/72 112/61 (!) 141/89  Pulse: 90 86 70 90  Resp: 20 20 20 20   Temp: 98.1 F (36.7 C) 99 F (37.2 C) 98.6 F (37 C) 98.7 F (37.1 C)  TempSrc: Oral Oral Oral Oral  SpO2: 99% 97% 100% 100%  Weight:      Height:       Physical Exam General: WNWD  Heart: S1,S2 RRR Lungs: CTAB A/P Abdomen: Active BS Extremities: Still has trace LE edema Dialysis Access: RIJ TDC drsg CDI. LFA AVF No bruit   Additional Objective Labs: Basic Metabolic Panel:  Recent Labs Lab 02/20/17 1742 02/20/17 1750 02/22/17 0817  NA 137 139 136  K 2.8* 2.9* 3.8  CL 101 98* 98*  CO2 26  --  27  GLUCOSE 177* 179* 126*  BUN 25* 26* 51*  CREATININE 3.76* 3.90* 6.79*  CALCIUM 8.0*  --  9.4  PHOS  --   --  6.5*   Liver Function Tests:  Recent Labs Lab 02/20/17 1742 02/22/17 0817  AST 17  --   ALT 17  --   ALKPHOS 70  --   BILITOT 0.3  --   PROT 6.9  --   ALBUMIN 3.0* 3.0*   No results for input(s): LIPASE, AMYLASE in the last 168 hours. CBC:  Recent Labs Lab 02/20/17 1742 02/20/17 1750 02/22/17 0818  WBC 11.3*  --  10.2  NEUTROABS 7.0  --   --   HGB 11.0* 12.2* 11.4*  HCT 33.8* 36.0* 34.6*  MCV 94.9  --  95.1  PLT 289  --  318   Blood Culture    Component Value Date/Time   SDES URINE, CLEAN CATCH 07/03/2013 1917   SPECREQUEST NONE 07/03/2013 1917   CULT  07/03/2013 1917    PROTEUS MIRABILIS Performed at [place] 07/06/2013 FINAL 07/03/2013 1917    Cardiac Enzymes: No results for input(s): CKTOTAL, CKMB, CKMBINDEX, TROPONINI in the last 168 hours. CBG:  Recent Labs Lab 02/20/17 1853 02/21/17 0635 02/21/17 1144 02/22/17 1756  GLUCAP 147* 116* 129* 117*   Iron Studies: No results for input(s): IRON, TIBC, TRANSFERRIN, FERRITIN in the  last 72 hours. @lablastinr3 @ Studies/Results: No results found. Medications:  . aspirin  325 mg Oral QPM  . atorvastatin  80 mg Oral q1800  . cinacalcet  30 mg Oral Q T,Th,Sa-HD  . clopidogrel  75 mg Oral Daily  . escitalopram  10 mg Oral QHS  . feeding supplement (NEPRO CARB STEADY)  237 mL Oral BID BM  . heparin  5,000 Units Subcutaneous Q8H  . lanthanum  1,000 mg Oral TID WC  . multivitamin  1 tablet Oral Daily     Dialysis Orders: [facility] T,Th,S 4.5 hrs 180 NRe 450/800 138.5 kg 2.0 K/ 2.0 Ca  -Heparin 10000 units IV TIW -Sensipar 30 mg PO TIW  -Venofer 50 mg IV weekly   BMD meds: -Fosrenol 1000 mg PO TID AC (Last Ca 10.2 C Ca 10.4 Phos 6.4 01/23/17)   Assessment/Plan: 1.  Acute Ischemic L MCA CVA: Three total sub-centimeter acute ischemic infarcts parietal lob, left splenius and R frontal lobe. Started on DAPT. Per primary/neurology following.  2. Hypokalemia-Next HD 02/25/17 3.  ESRD -  T,Th,S HD today on schedule. Tight heparin.  4.  Hypertension/volume  -  Better control of BP today. HD 02/22/17 Pre wt 133.1 kg Net UF 3.0 liters Post wt 129.7 kg. Wt is here is totally different than OP wts. Need standing wts.  5.  Anemia  - HGB 11.4. No ESA needed. Follow HGB.  6.  Metabolic bone disease -  Cont binders, sensipar. Ca 9.4 C Ca 10.2. No VDRA. Phos 6.5.  7.  Nutrition -Albumin 3.0. Renal/Carb mod diet. Add nepro/renal vit. 8. DM: per primary-note 09/07 mentions starting metformin.  NO METFORMIN USED IN ESRD! No DM meds on OP med list  Disposition: DC home today post echo with wife.    H.  NP-C 02/23/2017, 10:15 AM  [employer] [phone]

## 2017-02-27 ENCOUNTER — Encounter (HOSPITAL_COMMUNITY): Payer: Self-pay

## 2017-02-27 ENCOUNTER — Emergency Department (HOSPITAL_COMMUNITY)
Admission: EM | Admit: 2017-02-27 | Discharge: 2017-02-28 | Disposition: A | Discharge status: Home / Self Care | Payer: Medicare (Managed Care) | Attending: Emergency Medicine | Admitting: Emergency Medicine

## 2017-02-27 ENCOUNTER — Other Ambulatory Visit: Payer: Self-pay

## 2017-02-27 ENCOUNTER — Emergency Department (HOSPITAL_COMMUNITY): Payer: Medicare (Managed Care)

## 2017-02-27 DIAGNOSIS — R4701 Aphasia: Secondary | ICD-10-CM | POA: Insufficient documentation

## 2017-02-27 DIAGNOSIS — Z79899 Other long term (current) drug therapy: Secondary | ICD-10-CM | POA: Insufficient documentation

## 2017-02-27 DIAGNOSIS — I132 Hypertensive heart and chronic kidney disease with heart failure and with stage 5 chronic kidney disease, or end stage renal disease: Secondary | ICD-10-CM | POA: Diagnosis not present

## 2017-02-27 DIAGNOSIS — Z87891 Personal history of nicotine dependence: Secondary | ICD-10-CM | POA: Insufficient documentation

## 2017-02-27 DIAGNOSIS — E1122 Type 2 diabetes mellitus with diabetic chronic kidney disease: Secondary | ICD-10-CM | POA: Insufficient documentation

## 2017-02-27 DIAGNOSIS — I5022 Chronic systolic (congestive) heart failure: Secondary | ICD-10-CM | POA: Insufficient documentation

## 2017-02-27 DIAGNOSIS — N186 End stage renal disease: Secondary | ICD-10-CM | POA: Diagnosis not present

## 2017-02-27 DIAGNOSIS — Z7982 Long term (current) use of aspirin: Secondary | ICD-10-CM | POA: Diagnosis not present

## 2017-02-27 DIAGNOSIS — Z7902 Long term (current) use of antithrombotics/antiplatelets: Secondary | ICD-10-CM | POA: Insufficient documentation

## 2017-02-27 DIAGNOSIS — Z992 Dependence on renal dialysis: Secondary | ICD-10-CM | POA: Insufficient documentation

## 2017-02-27 DIAGNOSIS — Z8673 Personal history of transient ischemic attack (TIA), and cerebral infarction without residual deficits: Secondary | ICD-10-CM | POA: Diagnosis not present

## 2017-02-27 DIAGNOSIS — R4182 Altered mental status, unspecified: Secondary | ICD-10-CM | POA: Diagnosis present

## 2017-02-27 LAB — CBC WITH DIFFERENTIAL/PLATELET
BASOS ABS: 0.1 10*3/uL (ref 0.0–0.1)
BASOS PCT: 1 %
EOS PCT: 3 %
Eosinophils Absolute: 0.2 10*3/uL (ref 0.0–0.7)
HCT: 34 % — ABNORMAL LOW (ref 39.0–52.0)
Hemoglobin: 11.3 g/dL — ABNORMAL LOW (ref 13.0–17.0)
Lymphocytes Relative: 32 %
Lymphs Abs: 2.8 10*3/uL (ref 0.7–4.0)
MCH: 31.5 pg (ref 26.0–34.0)
MCHC: 33.2 g/dL (ref 30.0–36.0)
MCV: 94.7 fL (ref 78.0–100.0)
MONO ABS: 0.9 10*3/uL (ref 0.1–1.0)
Monocytes Relative: 11 %
Neutro Abs: 4.6 10*3/uL (ref 1.7–7.7)
Neutrophils Relative %: 53 %
Platelets: 344 10*3/uL (ref 150–400)
RBC: 3.59 MIL/uL — ABNORMAL LOW (ref 4.22–5.81)
RDW: 16.2 % — AB (ref 11.5–15.5)
WBC: 8.6 10*3/uL (ref 4.0–10.5)

## 2017-02-27 LAB — ETHANOL: Alcohol, Ethyl (B): <5 mg/dL (ref <5)

## 2017-02-27 LAB — BASIC METABOLIC PANEL
ANION GAP: 8 (ref 5–15)
BUN: 24 mg/dL — AB (ref 6–20)
CALCIUM: 8.6 mg/dL — AB (ref 8.9–10.3)
CO2: 29 mmol/L (ref 22–32)
Chloride: 99 mmol/L — ABNORMAL LOW (ref 101–111)
Creatinine, Ser: 4.8 mg/dL — ABNORMAL HIGH (ref 0.61–1.24)
GFR calc Af Amer: 13 mL/min — ABNORMAL LOW (ref >60)
GFR, EST NON AFRICAN AMERICAN: 11 mL/min — AB (ref >60)
GLUCOSE: 177 mg/dL — AB (ref 65–99)
Potassium: 3.3 mmol/L — ABNORMAL LOW (ref 3.5–5.1)
Sodium: 136 mmol/L (ref 135–145)

## 2017-02-27 NOTE — ED Notes (Signed)
Pt states that he doesn't pee every day and that he does sometimes.

## 2017-02-27 NOTE — ED Notes (Signed)
Dr. Wentz at bedside. 

## 2017-02-27 NOTE — ED Triage Notes (Signed)
Per [facility]: Pt came from dialysis. Staff said at 4:15, pt had all but 30 minutes of dialysis, pt "started acting funny", pt was not answering questions. Pt was conscious when EMS arrived. Pt seemed selective to what questions he answers and what commands he follows per EMS. Pt wouldn't lift his arm for EMS until he knew that EMS was trying to start a line and used his arm/hand to sign for EMS. Pt was seen on Saturday for "TIA", pt was apparently adamant that the had "multiple heart attacks", but the paperwork from Saturday said "TIA". Pt does have hx of anxiety, staff at dialysis stated that the pt seemed anxious today when he was complaining of shortness of breath.  Dialysis access is in left arm and chest.

## 2017-02-27 NOTE — ED Notes (Signed)
Dr. Eulis Foster with EMS assessing pt.

## 2017-02-27 NOTE — ED Notes (Signed)
Pt is talking to his son Will on the phone.

## 2017-02-27 NOTE — ED Notes (Signed)
Pt's son contacted and was notified pt is discharged home.  He states that he cannot pick pt up.

## 2017-02-28 NOTE — ED Notes (Signed)
Pt's son was concerned about pt going home tonight.  Explained to pt that per Eulis Foster EDP the head CT was negative for new stroke and that pt is cleared medically and his next step is to follow-up with his doctor.  HE states he does not understand why he can't just stay the night due to the time of night.  He's made aware that we cannot admit pts d/t time.  Pt's son verbalizes understanding and states that he will come pick pt up.

## 2017-02-28 NOTE — ED Provider Notes (Signed)
[facility] DEPT Provider Note   CSN: [id number] Arrival date & time: 02/27/17  1705     History   Chief Complaint Chief Complaint  Patient presents with  . Altered Mental Status    HPI Blake Seavey Sr. is a 66 y.o. male.  He presents, from dialysis, because he was "acting funny."  EMS feel that his examination was variable.  Level 5 caveat-nonverbal   HPI  Past Medical History:  Diagnosis Date  . CVA (cerebral vascular accident) [facility]) 12/2013   admitted in July 2015 for acute right-sided infarct/notes 12/02/2014- 01/28/17-  uses walker   . ESRD (end stage renal disease) ([place])    [facility] TTHSat  . Hypercholesterolemia   . Hypertension   . Multiple wounds    on lower legs - sees [name] Pain at the [facility]  . OSA on CPAP    uses cpap, setting of 5, 01/28/17- does not use any longer  . 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 Stroke ([place]) 12/02/2014   expressive aphasia 01/28/17- sometimes  . Systolic CHF ([place])   . Type II diabetes mellitus ([place])    uncontrolled/notes 12/02/2014    Patient Active Problem List   Diagnosis Date Noted  . Word finding difficulty   . Pressure injury of skin 02/22/2017  . TIA (transient ischemic attack) 02/21/2017  . Expressive aphasia 09/06/2016  . Weakness following cerebrovascular accident (CVA) 09/06/2016  . Poor tolerance for ambulation 09/06/2016  . Chronic kidney disease (CKD), stage IV (severe) ([place]) 09/12/2015  . Chronic diastolic heart failure, NYHA class 2 ([place]) 08/08/2015  . Anemia, iron deficiency 06/04/2015  . Acute on chronic systolic (congestive) heart failure ([place]) 05/29/2015  . Systolic CHF ([place]) 65/99/3570  . Uncontrolled diabetes with stage 4 chronic kidney disease GFR 15-29 ([place]) 12/28/2014  . Type II diabetes mellitus with neurological manifestations ([place]) 12/12/2014  . Acute ischemic left MCA stroke ([place]) 12/02/2014  . Type 2 diabetes mellitus with complication ([place]) 17/79/3903    . Essential hypertension, benign 03/24/2014  . HLD (hyperlipidemia) 03/24/2014  . CVA (cerebral infarction) 01/13/2014  . Hypernatremia 07/03/2013  . Morbid obesity ([place]) 07/03/2013  . Venous stasis ulcers ([place]) 07/03/2013  . DM (diabetes mellitus) type 2, uncontrolled, with ketoacidosis ([place]) 07/03/2013  . 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 Sellar or suprasellar mass 06/22/2013  . Acute respiratory failure ([place]) 06/22/2013  . Seizure ([place]) 06/21/2013  . Altered mental status 06/21/2013  . Hypercarbia 06/21/2013  . OSA (obstructive sleep apnea) 05/25/2013    Past Surgical History:  Procedure Laterality Date  . A/V FISTULAGRAM N/A 01/17/2017   Procedure: A/V Fistulagram - Left arm;  Surgeon: Elam Dutch, MD;  Location: [facility];  Service: Cardiovascular;  Laterality: N/A;  . AV FISTULA PLACEMENT Left 09/27/2015   Procedure: RADIOCEPHALIC ARTERIOVENOUS (AV) FISTULA CREATION LEFT ARM;  Surgeon: Mal Misty, MD;  Location: [facility];  Service: Vascular;  Laterality: Left;  . AV FISTULA PLACEMENT Left 01/29/2017   Procedure: ARTERIOVENOUS (AV) FISTULA CREATION BRACHIOCEPHALIC;  Surgeon: Elam Dutch, MD;  Location: [facility];  Service: Vascular;  Laterality: Left;  . COLONOSCOPY  6-7 yrs ago  . COLONOSCOPY N/A 04/05/2013   Procedure: COLONOSCOPY;  Surgeon: Juanita Craver, MD;  Location: [facility];  Service: Endoscopy;  Laterality: N/A;  . LIGATION OF ARTERIOVENOUS  FISTULA Left 01/29/2017   Procedure: LIGATION OF RADIOCEPHALIC ARTERIOVENOUS  FISTULA;  Surgeon: Elam Dutch, MD;  Location: [facility];  Service: Vascular;  Laterality: Left;       Home Medications    Prior to Admission medications   Medication Sig Start Date End Date Taking? Authorizing Provider  aspirin 325 MG tablet Take 1 tablet (325 mg total) by mouth every evening. 02/22/17   Debbe Odea, MD  atorvastatin (LIPITOR) 80 MG tablet Take 1 tablet (80 mg total) by mouth daily at 6 PM. 02/22/17   Debbe Odea, MD  cinacalcet (SENSIPAR) 30 MG tablet Take 1 tablet (30 mg total) by mouth Every Tuesday,Thursday,and Saturday with dialysis. 02/25/17   Debbe Odea, MD  clopidogrel (PLAVIX) 75 MG tablet TAKE 75 mg TABLET BY MOUTH EVERY DAY WITH BREAKFAST 02/22/17   Debbe Odea, MD  escitalopram (LEXAPRO) 10 MG tablet Take 1 tablet by mouth daily in evening Patient taking differently: Take 10 mg by mouth at bedtime.  11/05/16   Shawnee Knapp, MD  isosorbide mononitrate (IMDUR) 120 MG 24 hr tablet TAKE 1 TABLET BY MOUTH DAILY. 01/02/17   Shawnee Knapp, MD  lanthanum (FOSRENOL) 1000 MG chewable tablet Chew 1 tablet by mouth 3 (three) times daily.     [provider]  multivitamin (RENA-VIT) TABS tablet Take 1 tablet by mouth daily.    [provider]  senna-docusate (SENOKOT-S) 8.6-50 MG tablet Take 1 tablet by mouth at bedtime as needed for moderate constipation. 02/22/17   Debbe Odea, MD    Family History Family History  Problem Relation Age of Onset  . Diabetes Mother   . Hypertension Mother   . Diabetes Father   . Hypertension Father   . Asthma Son   . Diabetes Sister   . Diabetes Brother     Social History Social History  Substance Use Topics  . Smoking status: Former Smoker    Packs/day: 0.25    Years: 2.00    Types: Cigarettes    Quit date: 06/17/1970  . Smokeless tobacco: Never Used  . Alcohol use No     Allergies   Patient has no known allergies.   Review of Systems Review of Systems  Unable to perform ROS: Mental status change     Physical Exam Updated Vital Signs BP (!) 128/103   Pulse 78   Resp (!) 26   SpO2 99%   Physical Exam  Constitutional: He appears well-developed.  Obese  HENT:  Head: Normocephalic and atraumatic.  Right Ear: External ear normal.  Left Ear: External ear normal.  Eyes: Pupils are equal, round, and reactive to light. Conjunctivae and EOM are normal.  Neck: Normal range of motion and phonation normal. Neck supple.    Cardiovascular: Normal rate, regular rhythm and normal heart sounds.   Vascular catheter right upper chest wall, site and appliance appear normal  Pulmonary/Chest: Effort normal and breath sounds normal. He exhibits no bony tenderness.  Abdominal: Soft. There is no tenderness.  Musculoskeletal: Normal range of motion. He exhibits edema (Bilateral lower leg edema, 3-4+.).  Neurological: He is alert. No cranial nerve deficit. He exhibits normal muscle tone.  Nonverbal.  Less arms and legs, independently, on command.  Cooperates with examination.  Skin: Skin is warm, dry and intact.  Psychiatric:  Nonverbal, does not answer questions, follows commands and does not appear depressed.  Nursing note and vitals reviewed.    ED Treatments / Results  Labs (all labs ordered are listed, but only abnormal results are displayed) Labs Reviewed  BASIC METABOLIC PANEL - Abnormal; Notable for the following:       Result  Value   Potassium 3.3 (*)    Chloride 99 (*)    Glucose, Bld 177 (*)    BUN 24 (*)    Creatinine, Ser 4.80 (*)    Calcium 8.6 (*)    GFR calc non Af Amer 11 (*)    GFR calc Af Amer 13 (*)    All other components within normal limits  CBC WITH DIFFERENTIAL/PLATELET - Abnormal; Notable for the following:    RBC 3.59 (*)    Hemoglobin 11.3 (*)    HCT 34.0 (*)    RDW 16.2 (*)    All other components within normal limits  ETHANOL  RAPID URINE DRUG SCREEN, HOSP PERFORMED    EKG  EKG Interpretation  Date/Time:  Thursday February 27 2017 17:17:49 EDT Ventricular Rate:  94 PR Interval:    QRS Duration: 103 QT Interval:  423 QTC Calculation: 529 R Axis:   -34 Text Interpretation:  Sinus rhythm Ventricular premature complex Left axis deviation Prolonged QT interval Since last tracing QT has lengthened Confirmed by Daleen Bo [phone]) on 02/27/2017 5:24:56 PM       Radiology Ct Head Wo Contrast  Result Date: 02/27/2017 CLINICAL DATA:  Altered level consciousness.  Patient returned from dialysis but selectively follows commands and answers questions. EXAM: CT HEAD WITHOUT CONTRAST TECHNIQUE: Contiguous axial images were obtained from the base of the skull through the vertex without intravenous contrast. COMPARISON:  02/20/2017 FINDINGS: Brain: Stable generalized cerebral atrophy with moderate chronic small vessel ischemia. No large vascular territory infarct. No acute intracranial hemorrhage. No intra-axial mass, midline shift or mass effect. No hydrocephalus. No extra-axial fluid collections. Vascular: Atherosclerosis the carotid siphons, both vertebral arteries and proximal basilar. No hyperdense vessels. Skull: Negative for fracture or focal lesion. Sinuses/Orbits: No acute finding. Other: None. IMPRESSION: Moderate chronic small vessel ischemic disease. Stable generalized atrophy. No acute intracranial abnormality. Electronically Signed   By: Ashley Royalty M.D.   On: 02/27/2017 19:25    Procedures Procedures (including critical care time)  Medications Ordered in ED Medications - No data to display   Initial Impression / Assessment and Plan / ED Course  I have reviewed the triage vital signs and the nursing notes.  Pertinent labs & imaging results that were available during my care of the patient were reviewed by me and considered in my medical decision making (see chart for details).      Patient Vitals for the past 24 hrs:  BP Pulse Resp SpO2  02/27/17 2345 (!) 150/90 86 15 98 %  02/27/17 2215 (!) 128/103 78 (!) 26 99 %  02/27/17 2130 (!) 153/95 78 (!) 24 97 %  02/27/17 2045 (!) 153/97 78 (!) 38 97 %  02/27/17 2030 (!) 144/101 78 (!) 39 98 %  02/27/17 2015 (!) 175/91 84 (!) 29 98 %  02/27/17 1945 (!) 165/93 75 20 100 %  02/27/17 1915 (!) 164/95 81 (!) 35 95 %  02/27/17 1900 (!) 168/98 77 (!) 33 97 %  02/27/17 1830 (!) 152/98 79 (!) 34 98 %  02/27/17 1815 (!) 151/98 80 (!) 30 99 %  02/27/17 1800 (!) 152/95 82 (!) 42 97 %  02/27/17 1745 (!)  146/93 84 (!) 32 95 %  02/27/17 1730 125/83 85 (!) 25 94 %  02/27/17 1719 128/85 94 (!) 38 97 %  02/27/17 1655 - - - 96 %    12:16 AM Reevaluation with update and discussion. After initial assessment and treatment, an updated evaluation reveals  at this time the patient is eating comfortably, speaking clearly and has no further complaints.  Strength examination improved bilaterally, with near normal strength.  Findings discussed with patient and all questions answered. Nayel Purdy L      Final Clinical Impressions(s) / ED Diagnoses   Final diagnoses:  Expressive aphasia    A aphasia, with known recent stroke and apparent stable neurologic examination.  Doubt metabolic instability serious bacterial infection or impending vascular collapse.  End-stage renal disease, dialyzed today, with stable hemodynamic status.   Nursing Notes Reviewed/ Care Coordinated Applicable Imaging Reviewed Interpretation of Laboratory Data incorporated into ED treatment  The patient appears reasonably screened and/or stabilized for discharge and I doubt any other medical condition or other [facility] requiring further screening, evaluation, or treatment in the ED at this time prior to discharge.  Plan: Home Medications-continue usual; Home Treatments-rest; return here if the recommended treatment, does not improve the symptoms; Recommended follow up-P [place].    New Prescriptions New Prescriptions   No medications on file     Daleen Bo, MD 02/28/17 [phone]

## 2017-02-28 NOTE — Discharge Instructions (Signed)
The testing today was reassuring.  There is no sign of another stroke.  Take your medications as directed.  See your doctor for ongoing management as planned and as needed.

## 2017-02-28 NOTE — ED Notes (Signed)
Basin with soap and water placed on the bedside table for pt to wash up with per his request.  Pt is ambulatory in the room without difficulty and changed back into his clothes.

## 2017-03-10 ENCOUNTER — Other Ambulatory Visit: Payer: Self-pay | Admitting: *Deleted

## 2017-03-10 NOTE — Patient Outreach (Signed)
[facility]) Care Management  03/10/2017  Sion Reinders Sr. 1950/09/15 [id number]   EMMI- Stroke RED ON EMMI ALERT DAY#: 13 DATE: 03/09/17 RED ALERT: Feeling Worse Overall? Yes   Outreach attempt #1 to patient. No answer. RN CM left HIPAA compliant message along with contact info.    Plan: RN CM will contact patient within one week.   Lake Bells, RN, BSN, MHA/MSL, [place] Telephonic Care Manager Coordinator [facility] Direct Phone: [phone] Toll Free: [phone] Fax: [phone]

## 2017-03-11 ENCOUNTER — Other Ambulatory Visit: Payer: Self-pay | Admitting: *Deleted

## 2017-03-11 ENCOUNTER — Encounter: Payer: Self-pay | Admitting: *Deleted

## 2017-03-11 NOTE — Telephone Encounter (Signed)
This encounter was created in error - please disregard.

## 2017-03-11 NOTE — Patient Outreach (Signed)
[facility]) Care Management  03/11/2017  Abhay Godbolt Sr. 08-15-1950 [id number]  EMMI- Stroke RED ON EMMI ALERT DAY#: 13 DATE: 03/09/17 RED ALERT: Feeling Worse Overall? Yes Went to follow-up appointment? No Scheduled a follow-up appointment? No  Outreach attempt to patient. HIPAA identifiers verified with son Rajesh Wyss.). Son stated, patient may not have comprehended the questions correctly and answered the EMMI automated phone questionnaire inaccurate. Son reported, patient is active with [facility] on Monday, Wednesday, and Friday. Patient has dialysis on Tuesday and Thursday.  Son stated to please call him with any questions or concerns related to his parents.  Plan: RN CM will notify [facility] CM administrative assistant regarding case closure.    Lake Bells, RN, BSN, MHA/MSL, [place] Telephonic Care Manager Coordinator [facility] Direct Phone: [phone] Toll Free: [phone] Fax: [phone]

## 2017-03-19 ENCOUNTER — Other Ambulatory Visit: Payer: Self-pay | Admitting: *Deleted

## 2017-03-24 NOTE — Progress Notes (Signed)
Anesthesia Chart Review:  Pt is a same day work up.   Pt is a 66 year old male scheduled for L brachiocephalic AV fistula creation and ligation L radiocephalic on 09/98/3382 with Harold Barban, M.D.  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 Former smoker.  - Hospitalized 9/6-8/18 for acute stroke (bilateral MCA/ACA, left MCA/PCA watershed punctate infarcts as well as left splenium punctate infarcts, likely due to hypotension during dialysis in the setting of intracranial atherosclerosis and small vessel disease)  Medications include: ASA 325 mg, Plavix. Pt to stop plavix 03/21/17  Labs will be obtained DOS.   1 view CXR 01/29/17:  1. Right IJ dialysis catheter with tip over superior vena cava. No pneumothorax. 2. Mild cardiomegaly.  No pulmonary venous congestion. 3. Low lung volumes with mild basilar atelectasis.  EKG 02/27/17: Sinus rhythm. PVCs. LAD. Prolonged QT interval  Echo 02/23/17:  - Left ventricle: The cavity size was normal. There was severe concentric hypertrophy. Systolic function was vigorous. The estimated ejection fraction was in the range of 65% to 70%. Wall motion was normal; there were no regional wall motion abnormalities. Doppler parameters are consistent with abnormal left ventricular relaxation (grade 1 diastolic dysfunction). - Aortic valve: Trileaflet; mildly thickened, mildly calcified leaflets. Valve area (Vmax): 3.05 cm^2. - Mitral valve: Calcified annulus. Mildly thickened leaflets. Valve area by continuity equation (using LVOT flow): 3.66 cm^2. - Left atrium: The atrium was mildly dilated. - Impressions: No cardiac source of emboli was indentified.  If labs acceptable DOS, I anticipate pt can proceed as scheduled.   Willeen Cass, FNP-BC [facility]/Anesthesiology Phone: [phone] 03/24/2017 12:30 PM

## 2017-03-25 ENCOUNTER — Other Ambulatory Visit: Payer: Self-pay | Admitting: *Deleted

## 2017-03-31 ENCOUNTER — Ambulatory Visit: Payer: Medicare Other | Admitting: Podiatry

## 2017-04-01 ENCOUNTER — Encounter (HOSPITAL_COMMUNITY): Payer: Self-pay | Admitting: *Deleted

## 2017-04-01 ENCOUNTER — Ambulatory Visit: Payer: Medicare Other | Admitting: Podiatry

## 2017-04-01 NOTE — Progress Notes (Signed)
Spoke with pt's son, Blake Bell [facility]) for pre-op call. Blake Bell is very concerned that pt is going to be extremely anxious tomorrow and waiting 2.5 - 3 hours is going to make it even worse. He states the last 2 times that pt was scheduled for this surgery, pt was unable to control his anxiety. In August pt ended up in the ED from pre-op due to anxiety and in September, pt just refused to leave his home to come to the hospital. I talked with Rolla Flatten, RN, Asst. Director and she ok'ed that pt arrive 1.5 hours prior to surgery time. Also, pt Blake probably need something to sedate him/calm him while he is waiting. Pt's son states that would be fine to do. Pt is a diabetic, does not check his blood sugar at home. Last A1C was 6.9 on 02/21/17. Pt is not any diabetic medications. Son states pt has not complained of any recent chest pain or sob.

## 2017-04-02 ENCOUNTER — Ambulatory Visit (HOSPITAL_COMMUNITY)
Admission: RE | Admit: 2017-04-02 | Discharge: 2017-04-02 | Disposition: A | Discharge status: Home / Self Care | Payer: Medicare (Managed Care) | Source: Ambulatory Visit | Attending: Surgery | Admitting: Surgery

## 2017-04-02 ENCOUNTER — Encounter (HOSPITAL_COMMUNITY): Payer: Self-pay | Admitting: Certified Registered Nurse Anesthetist

## 2017-04-02 ENCOUNTER — Encounter (HOSPITAL_COMMUNITY)
Admission: RE | Disposition: A | Discharge status: Home / Self Care | Payer: Self-pay | Source: Ambulatory Visit | Attending: Surgery

## 2017-04-02 ENCOUNTER — Telehealth: Payer: Self-pay | Admitting: Vascular Surgery

## 2017-04-02 ENCOUNTER — Ambulatory Visit (HOSPITAL_COMMUNITY): Payer: Medicare (Managed Care) | Admitting: Emergency Medicine

## 2017-04-02 ENCOUNTER — Other Ambulatory Visit: Payer: Self-pay | Admitting: Family Medicine

## 2017-04-02 DIAGNOSIS — Z8673 Personal history of transient ischemic attack (TIA), and cerebral infarction without residual deficits: Secondary | ICD-10-CM | POA: Insufficient documentation

## 2017-04-02 DIAGNOSIS — E1122 Type 2 diabetes mellitus with diabetic chronic kidney disease: Secondary | ICD-10-CM | POA: Diagnosis not present

## 2017-04-02 DIAGNOSIS — N183 Chronic kidney disease, stage 3 (moderate): Secondary | ICD-10-CM | POA: Insufficient documentation

## 2017-04-02 DIAGNOSIS — Z87891 Personal history of nicotine dependence: Secondary | ICD-10-CM | POA: Insufficient documentation

## 2017-04-02 DIAGNOSIS — N186 End stage renal disease: Secondary | ICD-10-CM | POA: Diagnosis not present

## 2017-04-02 DIAGNOSIS — T82898A Other specified complication of vascular prosthetic devices, implants and grafts, initial encounter: Secondary | ICD-10-CM | POA: Diagnosis not present

## 2017-04-02 DIAGNOSIS — I130 Hypertensive heart and chronic kidney disease with heart failure and stage 1 through stage 4 chronic kidney disease, or unspecified chronic kidney disease: Secondary | ICD-10-CM | POA: Diagnosis not present

## 2017-04-02 DIAGNOSIS — E7800 Pure hypercholesterolemia, unspecified: Secondary | ICD-10-CM | POA: Diagnosis not present

## 2017-04-02 DIAGNOSIS — G4733 Obstructive sleep apnea (adult) (pediatric): Secondary | ICD-10-CM | POA: Diagnosis not present

## 2017-04-02 DIAGNOSIS — Z79899 Other long term (current) drug therapy: Secondary | ICD-10-CM | POA: Diagnosis not present

## 2017-04-02 DIAGNOSIS — Z7982 Long term (current) use of aspirin: Secondary | ICD-10-CM | POA: Diagnosis not present

## 2017-04-02 DIAGNOSIS — I5020 Unspecified systolic (congestive) heart failure: Secondary | ICD-10-CM | POA: Diagnosis not present

## 2017-04-02 HISTORY — PX: AV FISTULA PLACEMENT: SHX1204

## 2017-04-02 HISTORY — DX: Anemia, unspecified: D64.9

## 2017-04-02 LAB — GLUCOSE, CAPILLARY
GLUCOSE-CAPILLARY: 117 mg/dL — AB (ref 65–99)
GLUCOSE-CAPILLARY: 98 mg/dL (ref 65–99)

## 2017-04-02 LAB — POCT I-STAT 4, (NA,K, GLUC, HGB,HCT)
GLUCOSE: 122 mg/dL — AB (ref 65–99)
HCT: 38 % — ABNORMAL LOW (ref 39.0–52.0)
Hemoglobin: 12.9 g/dL — ABNORMAL LOW (ref 13.0–17.0)
POTASSIUM: 3.3 mmol/L — AB (ref 3.5–5.1)
Sodium: 142 mmol/L (ref 135–145)

## 2017-04-02 SURGERY — ARTERIOVENOUS (AV) FISTULA CREATION
Anesthesia: Monitor Anesthesia Care | Site: Arm Lower | Laterality: Left

## 2017-04-02 MED ORDER — LIDOCAINE HCL (PF) 1 % IJ SOLN
INTRAMUSCULAR | Status: AC
  Filled 2017-04-02: qty 30

## 2017-04-02 MED ORDER — SODIUM CHLORIDE 0.9 % IV SOLN
INTRAVENOUS | Status: DC | PRN
  Administered 2017-04-02: 12:00:00 500 mL

## 2017-04-02 MED ORDER — GLYCOPYRROLATE 0.2 MG/ML IJ SOLN
INTRAMUSCULAR | Status: DC | PRN
  Administered 2017-04-02: 0.2 mg via INTRAVENOUS

## 2017-04-02 MED ORDER — 0.9 % SODIUM CHLORIDE (POUR BTL) OPTIME
TOPICAL | Status: DC | PRN
  Administered 2017-04-02: 1000 mL

## 2017-04-02 MED ORDER — ONDANSETRON HCL 4 MG/2ML IJ SOLN
INTRAMUSCULAR | Status: DC | PRN
  Administered 2017-04-02: 4 mg via INTRAVENOUS

## 2017-04-02 MED ORDER — SODIUM CHLORIDE 0.9 % IV SOLN
INTRAVENOUS | Status: DC | PRN
  Administered 2017-04-02: 12:00:00 via INTRAVENOUS

## 2017-04-02 MED ORDER — HEPARIN SODIUM (PORCINE) 1000 UNIT/ML IJ SOLN
INTRAMUSCULAR | Status: AC
  Filled 2017-04-02: qty 1

## 2017-04-02 MED ORDER — DEXTROSE 5 % IV SOLN
INTRAVENOUS | Status: AC
  Filled 2017-04-02: qty 1.5

## 2017-04-02 MED ORDER — PHENYLEPHRINE 40 MCG/ML (10ML) SYRINGE FOR IV PUSH (FOR BLOOD PRESSURE SUPPORT)
PREFILLED_SYRINGE | INTRAVENOUS | Status: AC
  Filled 2017-04-02: qty 10

## 2017-04-02 MED ORDER — PROPOFOL 1000 MG/100ML IV EMUL
INTRAVENOUS | Status: AC
  Filled 2017-04-02: qty 100

## 2017-04-02 MED ORDER — LIDOCAINE 2% (20 MG/ML) 5 ML SYRINGE
INTRAMUSCULAR | Status: AC
  Filled 2017-04-02: qty 5

## 2017-04-02 MED ORDER — LIDOCAINE HCL (CARDIAC) 20 MG/ML IV SOLN
INTRAVENOUS | Status: DC | PRN
  Administered 2017-04-02: 100 mg via INTRAVENOUS

## 2017-04-02 MED ORDER — FENTANYL CITRATE (PF) 100 MCG/2ML IJ SOLN
INTRAMUSCULAR | Status: DC | PRN
  Administered 2017-04-02 (×2): 50 ug via INTRAVENOUS
  Administered 2017-04-02 (×2): 25 ug via INTRAVENOUS

## 2017-04-02 MED ORDER — LIDOCAINE HCL (PF) 1 % IJ SOLN
INTRAMUSCULAR | Status: DC | PRN
  Administered 2017-04-02: 16 mL

## 2017-04-02 MED ORDER — ONDANSETRON HCL 4 MG/2ML IJ SOLN
INTRAMUSCULAR | Status: AC
  Filled 2017-04-02: qty 2

## 2017-04-02 MED ORDER — MIDAZOLAM HCL 2 MG/2ML IJ SOLN
INTRAMUSCULAR | Status: AC
  Filled 2017-04-02: qty 2

## 2017-04-02 MED ORDER — PHENYLEPHRINE HCL 10 MG/ML IJ SOLN
INTRAMUSCULAR | Status: DC | PRN
  Administered 2017-04-02: 80 ug via INTRAVENOUS
  Administered 2017-04-02: 120 ug via INTRAVENOUS
  Administered 2017-04-02 (×2): 80 ug via INTRAVENOUS

## 2017-04-02 MED ORDER — DEXTROSE 5 % IV SOLN
INTRAVENOUS | Status: DC | PRN
  Administered 2017-04-02: 1.5 g via INTRAVENOUS

## 2017-04-02 MED ORDER — EPHEDRINE 5 MG/ML INJ
INTRAVENOUS | Status: AC
  Filled 2017-04-02: qty 10

## 2017-04-02 MED ORDER — HEPARIN SODIUM (PORCINE) 1000 UNIT/ML IJ SOLN
INTRAMUSCULAR | Status: DC | PRN
  Administered 2017-04-02: 5000 [IU] via INTRAVENOUS

## 2017-04-02 MED ORDER — PROPOFOL 500 MG/50ML IV EMUL
INTRAVENOUS | Status: DC | PRN
  Administered 2017-04-02: 50 ug/kg/min via INTRAVENOUS

## 2017-04-02 MED ORDER — PROTAMINE SULFATE 10 MG/ML IV SOLN
INTRAVENOUS | Status: AC
  Filled 2017-04-02: qty 15

## 2017-04-02 MED ORDER — MIDAZOLAM HCL 2 MG/2ML IJ SOLN
INTRAMUSCULAR | Status: DC | PRN
  Administered 2017-04-02: 2 mg via INTRAVENOUS

## 2017-04-02 MED ORDER — PROPOFOL 10 MG/ML IV BOLUS
INTRAVENOUS | Status: DC | PRN
  Administered 2017-04-02: 20 mg via INTRAVENOUS
  Administered 2017-04-02: 30 mg via INTRAVENOUS

## 2017-04-02 MED ORDER — FENTANYL CITRATE (PF) 250 MCG/5ML IJ SOLN
INTRAMUSCULAR | Status: AC
  Filled 2017-04-02: qty 5

## 2017-04-02 SURGICAL SUPPLY — 32 items
ARMBAND PINK RESTRICT EXTREMIT (MISCELLANEOUS) ×2 IMPLANT
CANISTER SUCT 3000ML PPV (MISCELLANEOUS) ×2 IMPLANT
CANNULA VESSEL 3MM 2 BLNT TIP (CANNULA) ×2 IMPLANT
CLIP VESOCCLUDE MED 6/CT (CLIP) ×2 IMPLANT
CLIP VESOCCLUDE SM WIDE 6/CT (CLIP) ×2 IMPLANT
COVER PROBE W GEL 5X96 (DRAPES) IMPLANT
DECANTER SPIKE VIAL GLASS SM (MISCELLANEOUS) ×2 IMPLANT
DERMABOND ADVANCED (GAUZE/BANDAGES/DRESSINGS) ×1
DERMABOND ADVANCED .7 DNX12 (GAUZE/BANDAGES/DRESSINGS) ×1 IMPLANT
DRAIN PENROSE 1/4X12 LTX STRL (WOUND CARE) ×2 IMPLANT
ELECT REM PT RETURN 9FT ADLT (ELECTROSURGICAL) ×2
ELECTRODE REM PT RTRN 9FT ADLT (ELECTROSURGICAL) ×1 IMPLANT
GLOVE BIO SURGEON STRL SZ7.5 (GLOVE) ×2 IMPLANT
GOWN STRL REUS W/ TWL LRG LVL3 (GOWN DISPOSABLE) ×3 IMPLANT
GOWN STRL REUS W/ TWL XL LVL3 (GOWN DISPOSABLE) ×2 IMPLANT
GOWN STRL REUS W/TWL LRG LVL3 (GOWN DISPOSABLE) ×3
GOWN STRL REUS W/TWL XL LVL3 (GOWN DISPOSABLE) ×2
KIT BASIN OR (CUSTOM PROCEDURE TRAY) ×2 IMPLANT
KIT ROOM TURNOVER OR (KITS) ×2 IMPLANT
LOOP VESSEL MINI RED (MISCELLANEOUS) IMPLANT
NS IRRIG 1000ML POUR BTL (IV SOLUTION) ×2 IMPLANT
PACK CV ACCESS (CUSTOM PROCEDURE TRAY) ×2 IMPLANT
PAD ARMBOARD 7.5X6 YLW CONV (MISCELLANEOUS) ×4 IMPLANT
SPONGE SURGIFOAM ABS GEL 100 (HEMOSTASIS) IMPLANT
SUT PROLENE 6 0 BV (SUTURE) ×2 IMPLANT
SUT PROLENE 7 0 BV 1 (SUTURE) ×2 IMPLANT
SUT VIC AB 3-0 SH 27 (SUTURE) ×2
SUT VIC AB 3-0 SH 27X BRD (SUTURE) ×2 IMPLANT
SUT VIC AB 4-0 PS2 27 (SUTURE) ×2 IMPLANT
SUT VICRYL 4-0 PS2 18IN ABS (SUTURE) ×2 IMPLANT
UNDERPAD 30X30 (UNDERPADS AND DIAPERS) ×2 IMPLANT
WATER STERILE IRR 1000ML POUR (IV SOLUTION) ×2 IMPLANT

## 2017-04-02 NOTE — Op Note (Addendum)
Procedure: Ligation of left radial cephalic AV fistula, placement Left Brachial Cephalic AV fistula  Preop: ESRD  Postop: ESRD  Anesthesia: General  Assistant RNFA  Findings: 3.5 mm cephalic vein  Procedure: After obtaining informed consent, the patient was taken to the operating room.  After adequate sedation, the left upper extremity was prepped and draped in usual sterile fashion.  Local anesthesia was infiltrated over the left wrist.  A longitudinal incision was made near the left wrist and carried down through the subcutaneous tissues to the level of the fistula.  This had minimal flow in it.  It was dissected free circumferentially and ligated with  2 0 silk tie.  Local anesthesia was then infiltrated near the antecubital crease.  A transverse incision was then made near the antecubital crease the left arm. The incision was carried into the subcutaneous tissues down to level of the cephalic vein. The cephalic vein was approximately 3.5 mm in diameter. It was of good quality. This was dissected free circumferentially and small side branches ligated and divided between silk ties or clips. Next the brachial artery was dissected free in the medial portion of the incision. The artery was  3-4 mm in diameter. The vessel loops were placed proximal and distal to the planned site of arteriotomy. The patient was given 5000 units of intravenous heparin. After appropriate circulation time, the vessel loops were used to control the artery. A longitudinal opening was made in the brachial artery.  The vein was ligated distally with a 2-0 silk tie. The vein was controlled proximally with a fine bulldog clamp. The vein was then swung over to the artery and sewn end of vein to side of artery using a running 7-0 Prolene suture. Just prior to completion of the anastomosis, everything was fore bled back bled and thoroughly flushed. The anastomosis was secured, vessel loops released, and there was a palpable thrill in  the fistula immediately. After hemostasis was obtained, the subcutaneous tissues of both incisions were reapproximated using a running 3-0 Vicryl suture. The skin was then closed with a 4 0 Vicryl subcuticular stitch. Dermabond was applied to the skin incisions.  The patient had an audible radial doppler signal at the end of the case.  Ruta Hinds, MD [facility] [place] Office: [phone] Pager: [phone]

## 2017-04-02 NOTE — Discharge Instructions (Signed)
[facility]  Discharge Instructions  AV Fistula or Graft Surgery for Dialysis Access  Please refer to the following instructions for your post-procedure care. Your surgeon or physician assistant will discuss any changes with you.  Activity  You may drive the day following your surgery, if you are comfortable and no longer taking prescription pain medication. Resume full activity as the soreness in your incision resolves.  Bathing/Showering  You may shower after you go home. Keep your incision dry for 48 hours. Do not soak in a bathtub, hot tub, or swim until the incision heals completely. You may not shower if you have a hemodialysis catheter.  Incision Care  Clean your incision with mild soap and water after 48 hours. Pat the area dry with a clean towel. You do not need a bandage unless otherwise instructed. Do not apply any ointments or creams to your incision. You may have skin glue on your incision. Do not peel it off. It will come off on its own in about one week. Your arm may swell a bit after surgery. To reduce swelling use pillows to elevate your arm so it is above your heart. Your doctor will tell you if you need to lightly wrap your arm with an ACE bandage.  Diet  Resume your normal diet. There are not special food restrictions following this procedure. In order to heal from your surgery, it is CRITICAL to get adequate nutrition. Your body requires vitamins, minerals, and protein. Vegetables are the best source of vitamins and minerals. Vegetables also provide the perfect balance of protein. Processed food has little nutritional value, so try to avoid this.  Medications  Resume taking all of your medications. If your incision is causing pain, you may take over-the counter pain relievers such as acetaminophen (Tylenol). If you were prescribed a stronger pain medication, please be aware these medications can cause nausea and constipation. Prevent  nausea by taking the medication with a snack or meal. Avoid constipation by drinking plenty of fluids and eating foods with high amount of fiber, such as fruits, vegetables, and grains. Do not take Tylenol if you are taking prescription pain medications.  Follow up Your surgeon may want to see you in the office following your access surgery. If so, this will be arranged at the time of your surgery.  Please call us immediately for any of the following conditions:  Increased pain, redness, drainage (pus) from your incision site Fever of 101 degrees or higher Severe or worsening pain at your incision site Hand pain or numbness.  Reduce your risk of vascular disease:  Stop smoking. If you would like help, call [facility] at 1-800-QUIT-NOW 838 211 9361) or [place] [facility] [place] your cholesterol Maintain a desired weight Control your diabetes Keep your blood pressure down  Dialysis  It will take several weeks to several months for your new dialysis access to be ready for use. Your surgeon will determine when it is OK to use it. Your nephrologist will continue to direct your dialysis. You can continue to use your Permcath until your new access is ready for use.   04/02/2017 Blake Ina Sr. [id number] 09-07-1950  Surgeon(s): Fields, Jessy Oto, MD  Procedure(s): ARTERIOVENOUS (AV) FISTULA CREATION LEFT BRACHIOCEPHALIC AND LIGATION LEFT RADIOCEPHALIC   May stick graft immediately   May stick graft on designated area only:    Do not stick fistula for 12 weeks    If you have any questions, please call the  office at [phone].

## 2017-04-02 NOTE — Transfer of Care (Cosign Needed)
Immediate Anesthesia Transfer of Care Note  Patient: Blake Bell.  Procedure(s) Performed: ARTERIOVENOUS (AV) FISTULA CREATION LEFT BRACHIOCEPHALIC AND LIGATION LEFT RADIOCEPHALIC (Left Arm Lower)  Patient Location: PACU  Anesthesia Type:MAC  Level of Consciousness: awake, alert , oriented and patient cooperative  Airway & Oxygen Therapy: Patient Spontanous Breathing and Patient connected to face mask oxygen  Post-op Assessment: Report given to RN and Post -op Vital signs reviewed and stable  Post vital signs: Reviewed and stable  Last Vitals:  Vitals:   04/02/17 1138 04/02/17 1356  BP: 133/81 110/75  Pulse: 83 92  Resp: 20 16  Temp: 36.6 C   SpO2: 99% 100%    Last Pain:  Vitals:   04/02/17 1138  TempSrc: Oral         Complications: No apparent anesthesia complications

## 2017-04-02 NOTE — Telephone Encounter (Signed)
Sched appt 04/14/17; lab at 2:00 and MD at 2:30. Lm on hm#.

## 2017-04-02 NOTE — Telephone Encounter (Signed)
-----   Message from Mena Goes, RN sent at 04/02/2017  1:45 PM EDT ----- Regarding: 6 weeks w/ duplex   ----- Message ----- From: Ulyses Amor, PA-C Sent: 04/02/2017   1:26 PM To: Vvs Charge Pool  F/U f/u with Dr. Oneida Alar in 6 weeks s/p av fistula creation needs fistula duplex.

## 2017-04-02 NOTE — H&P (Signed)
[facility] [place]  Patient name: Blake Ibarra Sr.  MRN: [id number]        DOB: Nov 17, 1950          Sex: male   HISTORY OF PRESENT ILLNESS:    Blake Wuertz Sr. is a 66 y.o. male who is s/p left radio-cephalic fistula creation by Dr. Kellie Simmering on 09-27-2015.  He is now on hemodialysis Tuesday Thursday Saturday through a catheter.  They're unable to use his fistula.  He had a fistulogram several months ago which showed the fistula was non salvageable.   PAST MEDICAL HISTORY:       Past Medical History:  Diagnosis Date  . Chronic kidney disease (CKD), stage III (moderate)    sees dr Florene Glen nephrology every 6-9 months  . CVA (cerebral vascular accident) [facility]) 12/2013   admitted in July 2015 for acute right-sided infarct/notes 12/02/2014  . Hypercholesterolemia   . Hypertension   . Multiple wounds    on lower legs - sees [name] Pain at the [facility]  . OSA on CPAP    uses cpap, setting of 5  . Prolactin secreting pituitary adenoma ([place])    [name] 12/02/2014  . Stroke ([place]) 12/02/2014   expressive aphasia  . Systolic CHF ([place])   . Type II diabetes mellitus ([place])    uncontrolled/notes 12/02/2014     FAMILY HISTORY:        Family History  Problem Relation Age of Onset  . Diabetes Mother   . Hypertension Mother   . Diabetes Father   . Hypertension Father   . Asthma Son   . Diabetes Sister   . Diabetes Brother     SOCIAL HISTORY:        Social History  Substance Use Topics  . Smoking status: Former Smoker    Packs/day: 0.25    Years: 2.00    Types: Cigarettes    Quit date: 06/17/1970  . Smokeless tobacco: Never Used  . Alcohol use No     ALLERGIES:   No Known Allergies   No current facility-administered medications on file prior to encounter.    Current Outpatient Prescriptions on File Prior to Encounter  Medication Sig Dispense Refill  .  aspirin 325 MG tablet Take 1 tablet (325 mg total) by mouth every evening. 30 tablet 0  . cinacalcet (SENSIPAR) 30 MG tablet Take 1 tablet (30 mg total) by mouth Every Tuesday,Thursday,and Saturday with dialysis. 60 tablet 0  . clopidogrel (PLAVIX) 75 MG tablet TAKE 75 mg TABLET BY MOUTH EVERY DAY WITH BREAKFAST 90 tablet 1  . escitalopram (LEXAPRO) 10 MG tablet Take 1 tablet by mouth daily in evening (Patient taking differently: Take 10 mg by mouth at bedtime. Take 1 tablet by mouth daily in evening) 90 tablet 1  . lanthanum (FOSRENOL) 1000 MG chewable tablet Chew 1 tablet by mouth 3 (three) times daily.     . multivitamin (RENA-VIT) TABS tablet Take 1 tablet by mouth daily.    Marland Kitchen senna-docusate (SENOKOT-S) 8.6-50 MG tablet Take 1 tablet by mouth at bedtime as needed for moderate constipation. 30 tablet 0  . atorvastatin (LIPITOR) 80 MG tablet Take 1 tablet (80 mg total) by mouth daily at 6 PM. (Patient not taking: Reported on 03/21/2017) 60 tablet 0  . isosorbide mononitrate (IMDUR) 120 MG 24 hr tablet TAKE 1 TABLET BY MOUTH DAILY. (Patient not taking: Reported on 03/21/2017) 90 tablet 0    PHYSICAL EXAM:    GENERAL: The patient  is a well-nourished male, in no acute distress. PULMONARY: Non-labored respirations MUSCULOSKELETAL: There are no major deformities or cyanosis. NEUROLOGIC: No focal weakness or paresthesias are detected. SKIN: There are no ulcers or rashes noted. PSYCHIATRIC: The patient has a normal affect.  MEDICAL ISSUES:   Non-maturing left radiocephalic fistula.  left brachiocephalic fistula today.   Risks benefits complications procedure details discussed. He wishes to proceed.  Ruta Hinds, MD [facility] [place] [facility] [place] Office: [phone] Pager: [phone]

## 2017-04-02 NOTE — Anesthesia Preprocedure Evaluation (Addendum)
Anesthesia Evaluation  Patient identified by MRN, date of birth, ID band Patient awake    Reviewed: Allergy & Precautions, H&P , NPO status , Patient's Chart, lab work & pertinent test results  Airway Mallampati: III  TM Distance: >3 FB Neck ROM: full    Dental  (+) Dental Advidsory Given   Pulmonary sleep apnea , former smoker,    breath sounds clear to auscultation       Cardiovascular hypertension, +CHF   Rhythm:regular     Neuro/Psych Seizures -,  CVA    GI/Hepatic   Endo/Other  diabetes  Renal/GU ESRFRenal disease     Musculoskeletal   Abdominal   Peds  Hematology  (+) anemia ,   Anesthesia Other Findings - Left ventricle: The cavity size was normal. There was moderate  concentric hypertrophy. Systolic function was normal. The  estimated ejection fraction was in the range of 55% to 60%. Wall  motion was normal; there were no regional wall motion  abnormalities. The study is not technically sufficient to allow  evaluation of LV diastolic function. - Mitral valve: There was mild regurgitation. - Left atrium: The atrium was mildly dilated. - Right ventricle: The cavity size was moderately dilated. Wall  thickness was normal. Systolic function was moderately reduced. - Right atrium: The atrium was moderately dilated. - Atrial septum: No defect or patent foramen ovale was identified. - Pulmonary arteries: PA peak pressure: 65 mm Hg (S). - Impressions: The right ventricular systolic pressure was  increased consistent with severe pulmonary hypertension.  Reproductive/Obstetrics                             Anesthesia Physical  Anesthesia Plan  ASA: III  Anesthesia Plan: MAC   Post-op Pain Management:    Induction: Intravenous  PONV Risk Score and Plan: 1 and Ondansetron and Propofol infusion  Airway Management Planned: Natural Airway and Simple Face Mask  Additional  Equipment:   Intra-op Plan:   Post-operative Plan:   Informed Consent: I have reviewed the patients History and Physical, chart, labs and discussed the procedure including the risks, benefits and alternatives for the proposed anesthesia with the patient or authorized representative who has indicated his/her understanding and acceptance.   Dental Advisory Given and Dental advisory given  Plan Discussed with: CRNA and Anesthesiologist  Anesthesia Plan Comments: (PLAN MAC  )       Anesthesia Quick Evaluation

## 2017-04-03 ENCOUNTER — Encounter (HOSPITAL_COMMUNITY): Payer: Self-pay | Admitting: Vascular Surgery

## 2017-04-03 NOTE — Addendum Note (Signed)
Addendum  created 04/03/17 1013 by Nolon Nations, MD   Anesthesia Staff edited

## 2017-04-03 NOTE — Anesthesia Postprocedure Evaluation (Signed)
Anesthesia Post Note  Patient: Blake Ina Sr.  Procedure(s) Performed: ARTERIOVENOUS (AV) FISTULA CREATION LEFT BRACHIOCEPHALIC AND LIGATION LEFT RADIOCEPHALIC (Left Arm Lower)     Patient location during evaluation: PACU Anesthesia Type: MAC Level of consciousness: awake and alert Pain management: pain level controlled Vital Signs Assessment: post-procedure vital signs reviewed and stable Respiratory status: spontaneous breathing Cardiovascular status: stable Anesthetic complications: no    Last Vitals:  Vitals:   04/02/17 1430 04/02/17 1442  BP: 121/87 117/66  Pulse: 89 88  Resp: (!) 30 18  Temp: 36.6 C 36.6 C  SpO2: 94% 96%    Last Pain:  Vitals:   04/02/17 1138  TempSrc: Oral   Pain Goal:                 Nolon Nations

## 2017-04-07 ENCOUNTER — Other Ambulatory Visit: Payer: Self-pay

## 2017-04-07 DIAGNOSIS — N186 End stage renal disease: Secondary | ICD-10-CM

## 2017-04-07 DIAGNOSIS — Z48812 Encounter for surgical aftercare following surgery on the circulatory system: Secondary | ICD-10-CM

## 2017-04-07 DIAGNOSIS — Z992 Dependence on renal dialysis: Secondary | ICD-10-CM

## 2017-04-09 ENCOUNTER — Encounter (HOSPITAL_COMMUNITY): Payer: Self-pay | Admitting: Vascular Surgery

## 2017-04-09 NOTE — Addendum Note (Signed)
Addendum  created 04/09/17 1317 by Nolon Nations, MD   Anesthesia Event edited, Anesthesia Staff edited

## 2017-04-15 ENCOUNTER — Inpatient Hospital Stay (HOSPITAL_COMMUNITY)
Admission: EM | Admit: 2017-04-15 | Discharge: 2017-04-18 | DRG: 064 | Disposition: A | Discharge status: Home / Self Care | Payer: Medicare (Managed Care) | Attending: Internal Medicine | Admitting: Internal Medicine

## 2017-04-15 ENCOUNTER — Emergency Department (HOSPITAL_COMMUNITY): Payer: Medicare (Managed Care)

## 2017-04-15 ENCOUNTER — Inpatient Hospital Stay (HOSPITAL_COMMUNITY): Payer: Medicare (Managed Care)

## 2017-04-15 ENCOUNTER — Other Ambulatory Visit: Payer: Self-pay

## 2017-04-15 DIAGNOSIS — Z794 Long term (current) use of insulin: Secondary | ICD-10-CM

## 2017-04-15 DIAGNOSIS — R29704 NIHSS score 4: Secondary | ICD-10-CM | POA: Diagnosis present

## 2017-04-15 DIAGNOSIS — Z993 Dependence on wheelchair: Secondary | ICD-10-CM

## 2017-04-15 DIAGNOSIS — Z6841 Body Mass Index (BMI) 40.0 and over, adult: Secondary | ICD-10-CM

## 2017-04-15 DIAGNOSIS — E1122 Type 2 diabetes mellitus with diabetic chronic kidney disease: Secondary | ICD-10-CM | POA: Diagnosis present

## 2017-04-15 DIAGNOSIS — I639 Cerebral infarction, unspecified: Principal | ICD-10-CM | POA: Diagnosis present

## 2017-04-15 DIAGNOSIS — E878 Other disorders of electrolyte and fluid balance, not elsewhere classified: Secondary | ICD-10-CM | POA: Diagnosis present

## 2017-04-15 DIAGNOSIS — D631 Anemia in chronic kidney disease: Secondary | ICD-10-CM | POA: Diagnosis present

## 2017-04-15 DIAGNOSIS — Z7902 Long term (current) use of antithrombotics/antiplatelets: Secondary | ICD-10-CM

## 2017-04-15 DIAGNOSIS — R4701 Aphasia: Secondary | ICD-10-CM | POA: Diagnosis present

## 2017-04-15 DIAGNOSIS — I132 Hypertensive heart and chronic kidney disease with heart failure and with stage 5 chronic kidney disease, or end stage renal disease: Secondary | ICD-10-CM | POA: Diagnosis present

## 2017-04-15 DIAGNOSIS — E119 Type 2 diabetes mellitus without complications: Secondary | ICD-10-CM | POA: Diagnosis present

## 2017-04-15 DIAGNOSIS — R402142 Coma scale, eyes open, spontaneous, at arrival to emergency department: Secondary | ICD-10-CM | POA: Diagnosis present

## 2017-04-15 DIAGNOSIS — I69320 Aphasia following cerebral infarction: Secondary | ICD-10-CM

## 2017-04-15 DIAGNOSIS — G4733 Obstructive sleep apnea (adult) (pediatric): Secondary | ICD-10-CM | POA: Diagnosis present

## 2017-04-15 DIAGNOSIS — Z87891 Personal history of nicotine dependence: Secondary | ICD-10-CM

## 2017-04-15 DIAGNOSIS — I120 Hypertensive chronic kidney disease with stage 5 chronic kidney disease or end stage renal disease: Secondary | ICD-10-CM | POA: Diagnosis not present

## 2017-04-15 DIAGNOSIS — G9341 Metabolic encephalopathy: Secondary | ICD-10-CM | POA: Diagnosis present

## 2017-04-15 DIAGNOSIS — I6300 Cerebral infarction due to thrombosis of unspecified precerebral artery: Secondary | ICD-10-CM | POA: Diagnosis not present

## 2017-04-15 DIAGNOSIS — I5032 Chronic diastolic (congestive) heart failure: Secondary | ICD-10-CM | POA: Diagnosis present

## 2017-04-15 DIAGNOSIS — R402252 Coma scale, best verbal response, oriented, at arrival to emergency department: Secondary | ICD-10-CM | POA: Diagnosis present

## 2017-04-15 DIAGNOSIS — E876 Hypokalemia: Secondary | ICD-10-CM | POA: Diagnosis present

## 2017-04-15 DIAGNOSIS — N186 End stage renal disease: Secondary | ICD-10-CM

## 2017-04-15 DIAGNOSIS — R471 Dysarthria and anarthria: Secondary | ICD-10-CM | POA: Diagnosis present

## 2017-04-15 DIAGNOSIS — I10 Essential (primary) hypertension: Secondary | ICD-10-CM | POA: Diagnosis present

## 2017-04-15 DIAGNOSIS — I5042 Chronic combined systolic (congestive) and diastolic (congestive) heart failure: Secondary | ICD-10-CM | POA: Diagnosis present

## 2017-04-15 DIAGNOSIS — Z79899 Other long term (current) drug therapy: Secondary | ICD-10-CM

## 2017-04-15 DIAGNOSIS — R402362 Coma scale, best motor response, obeys commands, at arrival to emergency department: Secondary | ICD-10-CM | POA: Diagnosis present

## 2017-04-15 DIAGNOSIS — E785 Hyperlipidemia, unspecified: Secondary | ICD-10-CM | POA: Diagnosis present

## 2017-04-15 DIAGNOSIS — F329 Major depressive disorder, single episode, unspecified: Secondary | ICD-10-CM | POA: Diagnosis present

## 2017-04-15 DIAGNOSIS — N2581 Secondary hyperparathyroidism of renal origin: Secondary | ICD-10-CM | POA: Diagnosis present

## 2017-04-15 DIAGNOSIS — Z8249 Family history of ischemic heart disease and other diseases of the circulatory system: Secondary | ICD-10-CM

## 2017-04-15 DIAGNOSIS — R410 Disorientation, unspecified: Secondary | ICD-10-CM

## 2017-04-15 DIAGNOSIS — Z992 Dependence on renal dialysis: Secondary | ICD-10-CM

## 2017-04-15 DIAGNOSIS — E8889 Other specified metabolic disorders: Secondary | ICD-10-CM | POA: Diagnosis present

## 2017-04-15 DIAGNOSIS — Z833 Family history of diabetes mellitus: Secondary | ICD-10-CM

## 2017-04-15 DIAGNOSIS — Z7901 Long term (current) use of anticoagulants: Secondary | ICD-10-CM

## 2017-04-15 DIAGNOSIS — E1149 Type 2 diabetes mellitus with other diabetic neurological complication: Secondary | ICD-10-CM | POA: Diagnosis present

## 2017-04-15 DIAGNOSIS — Z7982 Long term (current) use of aspirin: Secondary | ICD-10-CM

## 2017-04-15 DIAGNOSIS — Z9989 Dependence on other enabling machines and devices: Secondary | ICD-10-CM | POA: Diagnosis not present

## 2017-04-15 DIAGNOSIS — I6789 Other cerebrovascular disease: Secondary | ICD-10-CM | POA: Diagnosis not present

## 2017-04-15 LAB — GLUCOSE, CAPILLARY: Glucose-Capillary: 112 mg/dL — ABNORMAL HIGH (ref 65–99)

## 2017-04-15 LAB — COMPREHENSIVE METABOLIC PANEL
ALT: 21 U/L (ref 17–63)
ANION GAP: 12 (ref 5–15)
AST: 21 U/L (ref 15–41)
Albumin: 3.5 g/dL (ref 3.5–5.0)
Alkaline Phosphatase: 96 U/L (ref 38–126)
BUN: 24 mg/dL — ABNORMAL HIGH (ref 6–20)
CHLORIDE: 95 mmol/L — AB (ref 101–111)
CO2: 30 mmol/L (ref 22–32)
CREATININE: 4.16 mg/dL — AB (ref 0.61–1.24)
Calcium: 8.6 mg/dL — ABNORMAL LOW (ref 8.9–10.3)
GFR, EST AFRICAN AMERICAN: 16 mL/min — AB (ref >60)
GFR, EST NON AFRICAN AMERICAN: 14 mL/min — AB (ref >60)
Glucose, Bld: 116 mg/dL — ABNORMAL HIGH (ref 65–99)
POTASSIUM: 3 mmol/L — AB (ref 3.5–5.1)
SODIUM: 137 mmol/L (ref 135–145)
Total Bilirubin: 0.5 mg/dL (ref 0.3–1.2)
Total Protein: 7.6 g/dL (ref 6.5–8.1)

## 2017-04-15 LAB — ETHANOL

## 2017-04-15 LAB — DIFFERENTIAL
BASOS PCT: 1 %
Basophils Absolute: 0.1 10*3/uL (ref 0.0–0.1)
EOS ABS: 0.3 10*3/uL (ref 0.0–0.7)
EOS PCT: 3 %
Lymphocytes Relative: 37 %
Lymphs Abs: 3.1 10*3/uL (ref 0.7–4.0)
MONO ABS: 0.7 10*3/uL (ref 0.1–1.0)
MONOS PCT: 8 %
NEUTROS ABS: 4.3 10*3/uL (ref 1.7–7.7)
Neutrophils Relative %: 51 %

## 2017-04-15 LAB — CBC
HEMATOCRIT: 37 % — AB (ref 39.0–52.0)
Hemoglobin: 12.2 g/dL — ABNORMAL LOW (ref 13.0–17.0)
MCH: 31.4 pg (ref 26.0–34.0)
MCHC: 33 g/dL (ref 30.0–36.0)
MCV: 95.4 fL (ref 78.0–100.0)
PLATELETS: 302 10*3/uL (ref 150–400)
RBC: 3.88 MIL/uL — ABNORMAL LOW (ref 4.22–5.81)
RDW: 18.3 % — AB (ref 11.5–15.5)
WBC: 8.4 10*3/uL (ref 4.0–10.5)

## 2017-04-15 LAB — I-STAT CHEM 8, ED
BUN: 28 mg/dL — ABNORMAL HIGH (ref 6–20)
CALCIUM ION: 0.95 mmol/L — AB (ref 1.15–1.40)
CHLORIDE: 95 mmol/L — AB (ref 101–111)
Creatinine, Ser: 4.1 mg/dL — ABNORMAL HIGH (ref 0.61–1.24)
GLUCOSE: 120 mg/dL — AB (ref 65–99)
HCT: 41 % (ref 39.0–52.0)
HEMOGLOBIN: 13.9 g/dL (ref 13.0–17.0)
POTASSIUM: 3.2 mmol/L — AB (ref 3.5–5.1)
SODIUM: 140 mmol/L (ref 135–145)
TCO2: 32 mmol/L (ref 22–32)

## 2017-04-15 LAB — APTT: aPTT: 39 seconds — ABNORMAL HIGH (ref 24–36)

## 2017-04-15 LAB — PROTIME-INR
INR: 1.05
PROTHROMBIN TIME: 13.6 s (ref 11.4–15.2)

## 2017-04-15 LAB — I-STAT TROPONIN, ED: TROPONIN I, POC: 0.03 ng/mL (ref 0.00–0.08)

## 2017-04-15 LAB — AMMONIA: Ammonia: 40 umol/L — ABNORMAL HIGH (ref 9–35)

## 2017-04-15 MED ORDER — ESCITALOPRAM OXALATE 10 MG PO TABS
10.0000 mg | ORAL_TABLET | Freq: Every day | ORAL | Status: DC
  Administered 2017-04-16 – 2017-04-17 (×3): 10 mg via ORAL
  Filled 2017-04-15 (×3): qty 1

## 2017-04-15 MED ORDER — RENA-VITE PO TABS
1.0000 | ORAL_TABLET | Freq: Every day | ORAL | Status: DC
  Administered 2017-04-16 – 2017-04-18 (×3): 1 via ORAL
  Filled 2017-04-15 (×3): qty 1

## 2017-04-15 MED ORDER — POTASSIUM CHLORIDE 20 MEQ/15ML (10%) PO SOLN
40.0000 meq | Freq: Once | ORAL | Status: AC
  Administered 2017-04-16: 40 meq via ORAL
  Filled 2017-04-15: qty 30

## 2017-04-15 MED ORDER — LORAZEPAM 2 MG/ML IJ SOLN: 1.0000 mg | Freq: Once | INTRAMUSCULAR | Status: DC

## 2017-04-15 MED ORDER — ACETAMINOPHEN 325 MG PO TABS
650.0000 mg | ORAL_TABLET | Freq: Three times a day (TID) | ORAL | Status: DC
  Administered 2017-04-16 – 2017-04-18 (×9): 650 mg via ORAL
  Filled 2017-04-15 (×9): qty 2

## 2017-04-15 MED ORDER — SENNOSIDES-DOCUSATE SODIUM 8.6-50 MG PO TABS: 1.0000 | ORAL_TABLET | Freq: Every evening | ORAL | Status: DC | PRN

## 2017-04-15 MED ORDER — ZOLPIDEM TARTRATE 5 MG PO TABS
5.0000 mg | ORAL_TABLET | Freq: Every evening | ORAL | Status: DC | PRN
  Administered 2017-04-16: 5 mg via ORAL
  Filled 2017-04-15 (×2): qty 1

## 2017-04-15 MED ORDER — CLOPIDOGREL BISULFATE 75 MG PO TABS
75.0000 mg | ORAL_TABLET | Freq: Every day | ORAL | Status: DC
  Administered 2017-04-16 – 2017-04-18 (×3): 75 mg via ORAL
  Filled 2017-04-15 (×3): qty 1

## 2017-04-15 MED ORDER — CINACALCET HCL 30 MG PO TABS: 30.0000 mg | ORAL_TABLET | ORAL | Status: DC

## 2017-04-15 MED ORDER — LANTHANUM CARBONATE 500 MG PO CHEW
1000.0000 mg | CHEWABLE_TABLET | Freq: Three times a day (TID) | ORAL | Status: DC
  Administered 2017-04-16 – 2017-04-18 (×7): 1000 mg via ORAL
  Filled 2017-04-15 (×10): qty 2

## 2017-04-15 MED ORDER — HEPARIN SODIUM (PORCINE) 5000 UNIT/ML IJ SOLN
5000.0000 [IU] | Freq: Three times a day (TID) | INTRAMUSCULAR | Status: DC
  Administered 2017-04-16 – 2017-04-18 (×9): 5000 [IU] via SUBCUTANEOUS
  Filled 2017-04-15 (×9): qty 1

## 2017-04-15 MED ORDER — ASPIRIN 325 MG PO TABS
325.0000 mg | ORAL_TABLET | Freq: Every evening | ORAL | Status: DC
  Administered 2017-04-16: 325 mg via ORAL
  Filled 2017-04-15: qty 1

## 2017-04-15 MED ORDER — ATORVASTATIN CALCIUM 80 MG PO TABS
80.0000 mg | ORAL_TABLET | Freq: Every day | ORAL | Status: DC
  Administered 2017-04-16 – 2017-04-17 (×2): 80 mg via ORAL
  Filled 2017-04-15: qty 1
  Filled 2017-04-15: qty 2

## 2017-04-15 MED ORDER — ONDANSETRON HCL 4 MG/2ML IJ SOLN: 4.0000 mg | Freq: Three times a day (TID) | INTRAMUSCULAR | Status: DC | PRN

## 2017-04-15 MED ORDER — STROKE: EARLY STAGES OF RECOVERY BOOK
Freq: Once | Status: AC
  Administered 2017-04-16: 01:00:00
  Filled 2017-04-15: qty 1

## 2017-04-15 MED ORDER — HYDRALAZINE HCL 20 MG/ML IJ SOLN: 5.0000 mg | INTRAMUSCULAR | Status: DC | PRN

## 2017-04-15 NOTE — Consult Note (Signed)
Neurology Consultation  Reason for Consult: Code stroke Referring Physician: Dr. Leonette Monarch  CC: Aphasia  History is obtained from: EMS  HPI: Blake Harte Sr. is a 66 y.o. male who has a past medical history of anemia, end-stage renal disease on dialysis, stroke in the left cerebral hemisphere watershed distribution in September 2018, intracranial atherosclerosis, obstructive sleep apnea, and intermittent expressive aphasia was brought in for evaluation his acute stroke from the dialysis facility where he was last seen normal at 5 PM. The patient was waiting for his right after dialysis as he is wheelchair-bound and uses the transit [place], and when the driver of the [place] arrived, the patient's speech did not sound right. Roughly around 5 PM, right after dialysis, the patient had communication with the staff at the dialysis center presumably and was in his usual baseline. Upon arrival into the ER, he was examined at the bridge where he had no focal cranial nerve, motor or sensory weakness.  His speech was mildly dysarthric and aphasic. He was not able to provide a full coherent history at the time of this exam.   LKW: 5.00pm 04/15/17 tpa given?: no, low stroke scale Premorbid modified Rankin scale (mRS): 3-4  ROS: Unable to obtain due to altered mental status.   Past Medical History:  Diagnosis Date  . Anemia   . CVA (cerebral vascular accident) [facility]) 12/2013   admitted in July 2015 for acute right-sided infarct/notes 12/02/2014- 01/28/17-  uses walker   . ESRD (end stage renal disease) ([place])    Hemo TTHSat  . Hypercholesterolemia   . Hypertension   . Multiple wounds    on lower legs - sees [name] Pain at the [facility]  . OSA on CPAP    uses cpap, setting of 5, 01/28/17- does not use any longer  . 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 Stroke ([place]) 12/02/2014   expressive aphasia 01/28/17- sometimes  . Systolic CHF ([place])   . Type II diabetes mellitus  (HCC)    uncontrolled/notes 12/02/2014    Family History  Problem Relation Age of Onset  . Diabetes Mother   . Hypertension Mother   . Diabetes Father   . Hypertension Father   . Asthma Son   . Diabetes Sister   . Diabetes Brother     Social History:   reports that he quit smoking about 46 years ago. His smoking use included Cigarettes. He has a 0.50 pack-year smoking history. He has never used smokeless tobacco. He reports that he uses drugs, including Marijuana. He reports that he does not drink alcohol.  Medications No current facility-administered medications for this encounter.   Current Outpatient Prescriptions:  .  acetaminophen (ACETAMINOPHEN 8 HOUR) 650 MG CR tablet, Take 650 mg by mouth every 8 (eight) hours., Disp: , Rfl:  .  aspirin 325 MG tablet, Take 1 tablet (325 mg total) by mouth every evening., Disp: 30 tablet, Rfl: 0 .  atorvastatin (LIPITOR) 80 MG tablet, Take 1 tablet (80 mg total) by mouth daily at 6 PM. (Patient not taking: Reported on 03/21/2017), Disp: 60 tablet, Rfl: 0 .  cinacalcet (SENSIPAR) 30 MG tablet, Take 1 tablet (30 mg total) by mouth Every Tuesday,Thursday,and Saturday with dialysis., Disp: 60 tablet, Rfl: 0 .  clopidogrel (PLAVIX) 75 MG tablet, TAKE 75 mg TABLET BY MOUTH EVERY DAY WITH BREAKFAST, Disp: 90 tablet, Rfl: 1 .  escitalopram (LEXAPRO) 10 MG tablet, Take 1 tablet by mouth daily in evening (Patient  taking differently: Take 10 mg by mouth at bedtime. Take 1 tablet by mouth daily in evening), Disp: 90 tablet, Rfl: 1 .  isosorbide mononitrate (IMDUR) 120 MG 24 hr tablet, TAKE 1 TABLET BY MOUTH DAILY. (Patient not taking: Reported on 03/21/2017), Disp: 90 tablet, Rfl: 0 .  lanthanum (FOSRENOL) 1000 MG chewable tablet, Chew 1 tablet by mouth 3 (three) times daily. , Disp: , Rfl:  .  multivitamin (RENA-VIT) TABS tablet, Take 1 tablet by mouth daily., Disp: , Rfl:  .  senna-docusate (SENOKOT-S) 8.6-50 MG tablet, Take 1 tablet by mouth at bedtime as  needed for moderate constipation., Disp: 30 tablet, Rfl: 0   Exam: Current vital signs: BP 120/70 (BP Location: Right Arm)   Pulse 76   Temp 97.8 F (36.6 C) (Oral)   Resp 20   SpO2 96%  Vital signs in last 24 hours: Temp:  [97.8 F (36.6 C)] 97.8 F (36.6 C) (10/30 1803) Pulse Rate:  [76] 76 (10/30 1803) Resp:  [20] 20 (10/30 1803) BP: (120)/(70) 120/70 (10/30 1803) SpO2:  [96 %] 96 % (10/30 1803) GENERAL: Awake, alert in NAD HEENT: - Normocephalic and atraumatic, dry mm, no LN++, no Thyromegally LUNGS - Clear to auscultation bilaterally with no wheezes CV - S1S2 RRR, no m/r/g, equal pulses bilaterally. ABDOMEN - Soft, nontender, nondistended with normoactive BS Ext: warm, well perfused, intact peripheral pulses, 2+edema  NEURO:  Mental Status: AA&ox2 Language: speech is mildly dysarthric.  Naming is mildly impaired.  Repetition is intact.  Fluency is intact.  Attention is intact. Cranial Nerves: PERRL 45mm/brisk. EOMI, visual fields full, no facial asymmetry, facial sensation intact, hearing intact, tongue/uvula/soft palate midline, normal sternocleidomastoid and trapezius muscle strength. No evidence of tongue atrophy or fibrillations Motor: Symmetric 3/5 bilateral lower extremities.  Symmetric nearly 5/5 bilateral upper extremities.  No vertical drift in upper extremities.  Unable to hold lower extremities up without falling to bed. Tone: is normal and bulk is normal Sensation- Intact to light touch bilaterally Coordination: FTN intact bilaterally, unable to perform heel-knee-shin Gait- deferred  NIHSS 1a Level of Conscious.: 0 1b LOC Questions: 1 1c LOC Commands: 0 2 Best Gaze: 0 3 Visual: 0 4 Facial Palsy: 0 5a Motor Arm - left: 0 5b Motor Arm - Right: 0 6a Motor Leg - Left: 2 6b Motor Leg - Right: 2 7 Limb Ataxia: 0 8 Sensory: 0 9 Best Language: 1 10 Dysarthria: 1 11 Extinct. and Inatten.: 0 TOTAL: 7 (delta NIH may be 1-2)    Labs I have reviewed labs in  epic and the results pertinent to this consultation are:  CBC    Component Value Date/Time   WBC 8.6 02/27/2017 1740   RBC 3.59 (L) 02/27/2017 1740   HGB 13.9 04/15/2017 1757   HGB 9.4 (L) 11/15/2016 1038   HCT 41.0 04/15/2017 1757   HCT 28.4 (L) 11/15/2016 1038   PLT 344 02/27/2017 1740   PLT 378 11/15/2016 1038   MCV 94.7 02/27/2017 1740   MCV 89 11/15/2016 1038   MCH 31.5 02/27/2017 1740   MCHC 33.2 02/27/2017 1740   RDW 16.2 (H) 02/27/2017 1740   RDW 16.3 (H) 11/15/2016 1038   LYMPHSABS 2.8 02/27/2017 1740   LYMPHSABS 1.3 11/15/2016 1038   MONOABS 0.9 02/27/2017 1740   EOSABS 0.2 02/27/2017 1740   EOSABS 0.2 11/15/2016 1038   BASOSABS 0.1 02/27/2017 1740   BASOSABS 0.1 11/15/2016 1038    CMP     Component Value Date/Time  NA 140 04/15/2017 1757   NA 139 11/15/2016 1038   K 3.2 (L) 04/15/2017 1757   CL 95 (L) 04/15/2017 1757   CO2 29 02/27/2017 1740   GLUCOSE 120 (H) 04/15/2017 1757   BUN 28 (H) 04/15/2017 1757   BUN 111 (HH) 11/15/2016 1038   CREATININE 4.10 (H) 04/15/2017 1757   CREATININE 5.01 (H) 03/12/2016 1341   CALCIUM 8.6 (L) 02/27/2017 1740   PROT 6.9 02/20/2017 1742   PROT 8.0 11/15/2016 1038   ALBUMIN 3.0 (L) 02/22/2017 0817   ALBUMIN 3.8 11/15/2016 1038   AST 17 02/20/2017 1742   ALT 17 02/20/2017 1742   ALKPHOS 70 02/20/2017 1742   BILITOT 0.3 02/20/2017 1742   BILITOT 0.3 11/15/2016 1038   GFRNONAA 11 (L) 02/27/2017 1740   GFRNONAA 11 (L) 03/12/2016 1341   GFRAA 13 (L) 02/27/2017 1740   GFRAA 13 (L) 03/12/2016 1341    Lipid Panel     Component Value Date/Time   CHOL 112 02/21/2017 0604   TRIG 95 02/21/2017 0604   HDL 43 02/21/2017 0604   CHOLHDL 2.6 02/21/2017 0604   VLDL 19 02/21/2017 0604   LDLCALC 50 02/21/2017 0604     Imaging I have reviewed the images obtained:  CT-scan of the brain -no acute changes.  Chronic white matter disease.  CTA from prior admission -focal moderate proximal right M1 stenosis.  Diffuse  intracranial lateral sclerosis. MRI from September 2018 showed 3 punctate areas of excessive diffusion in the left cerebral hemisphere almost in a watershed pattern.   Assessment:  66 year old man with above the past medical history presented to the emergency room for evaluation of sudden onset of word finding difficulty and speech problems. On examination, he is mildly dysarthric and aphasic. He has bilateral symmetrical lower extremity weakness.  No focal upper extremity weakness.  No sensory or cranial nerve abnormalities at this time. His stroke scale although 7, changes only 1 or 2 points. Given his relatively mild change in his neurological status, even in the presence of stroke risk factors, and these episodes having had happened close to dialysis which could also be because of dialysis disequilibrium syndrome, I decided not to give him IV TPA. There was no finding suggestive of large vessel occlusion. He is a renal patient, I will obtain an MRA instead of doing a CTA on him.  Impression: Acute ischemic stroke versus TIA versus dialysis disequilibrium syndrome  Recommendations: .-Admit to observation -Telemetry monitoring -Allow for permissive hypertension for the first 24-48h - only treat PRN if SBP >220 mmHg. Blood pressures can be gradually normalized to SBP<140 upon discharge. -MRI, MRA  of the brain without contrast -Carotid dopplers -Echocardiogram -HgbA1c, fasting lipid panel -Frequent neuro checks -Prophylactic therapy-c/w ASA and PLAVIX -Atorvastatin 80 mg PO daily -Risk factor modification -PT consult, OT consult, Speech consult  Please page stroke NP/PA/MD (listed on AMION)  from 8am-4 pm as this patient will be followed by the stroke team at this point.      Amie Portland, MD [facility] [phone] If 7pm to 7am, please call on call as listed on AMION.

## 2017-04-15 NOTE — ED Triage Notes (Signed)
Per [facility],  Pt finished dialysis. When transporter arrived to pick up pt. He noticed the pt was not able to communicate. Dialysis center called EMS. Pt has expressive aphasia. Pt has hx of stroke, but can normally communicate without difficulty. Pt alert, in no acute distress.

## 2017-04-15 NOTE — ED Provider Notes (Signed)
[facility] EMERGENCY DEPARTMENT Provider Note  CSN: [id number] Arrival date & time: 04/15/17 1740  Chief Complaint(s) Code Stroke  HPI Blake Carattini Sr. is a 66 y.o. male with an extensive past medical history listed below including hypertension, hyperlipidemia, ESRD on dialysis, prior CVAs who presents to the emergency department with sudden onset of confusion and aphasia which started at 1700 following completion of his dialysis session.  Patient was waiting on transportation back home when his symptoms began suddenly.  EMS was called who transported the patient to the emergency department.  Code stroke was initiated in route.  Upon arrival airway was cleared at the past.  Taken to CT scan with neurology evaluation.  Patient is disoriented and does have a aphasia.  Able to follow commands.  Remainder of history, ROS, and physical exam limited due to patient's condition (AMS). Additional information was obtained from EMS.   Level V Caveat.    HPI  Past Medical History Past Medical History:  Diagnosis Date  . Anemia   . CVA (cerebral vascular accident) [facility] 12/2013   admitted in July 2015 for acute right-sided infarct/notes 12/02/2014- 01/28/17-  uses walker   . ESRD (end stage renal disease) ([place])    Hemo TTHSat  . Hypercholesterolemia   . Hypertension   . Multiple wounds    on lower legs - sees [name] Pain at the [facility]  . OSA on CPAP    uses cpap, setting of 5, 01/28/17- does not use any longer  . 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 Stroke ([place]) 12/02/2014   expressive aphasia 01/28/17- sometimes  . Systolic CHF ([place])   . Type II diabetes mellitus ([place])    uncontrolled/notes 12/02/2014   Patient Active Problem List   Diagnosis Date Noted  . Word finding difficulty   . Pressure injury of skin 02/22/2017  . TIA (transient ischemic attack) 02/21/2017  . Expressive aphasia 09/06/2016  . Weakness following  cerebrovascular accident (CVA) 09/06/2016  . Poor tolerance for ambulation 09/06/2016  . Chronic kidney disease (CKD), stage IV (severe) ([place]) 09/12/2015  . Chronic diastolic heart failure, NYHA class 2 ([place]) 08/08/2015  . Anemia, iron deficiency 06/04/2015  . Acute on chronic systolic (congestive) heart failure ([place]) 05/29/2015  . Systolic CHF ([place]) 56/38/7564  . Uncontrolled diabetes with stage 4 chronic kidney disease GFR 15-29 ([place]) 12/28/2014  . Type II diabetes mellitus with neurological manifestations ([place]) 12/12/2014  . Acute ischemic left MCA stroke ([place]) 12/02/2014  . Type 2 diabetes mellitus with complication ([place]) 33/29/5188  . Essential hypertension, benign 03/24/2014  . HLD (hyperlipidemia) 03/24/2014  . CVA (cerebral infarction) 01/13/2014  . Hypernatremia 07/03/2013  . Morbid obesity ([place]) 07/03/2013  . Venous stasis ulcers ([place]) 07/03/2013  . DM (diabetes mellitus) type 2, uncontrolled, with ketoacidosis ([place]) 07/03/2013  . 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 Sellar or suprasellar mass 06/22/2013  . Acute respiratory failure ([place]) 06/22/2013  . Seizure ([place]) 06/21/2013  . Altered mental status 06/21/2013  . Hypercarbia 06/21/2013  . OSA (obstructive sleep apnea) 05/25/2013   Home Medication(s) Prior to Admission medications   Medication Sig Start Date End Date Taking? Authorizing Provider  acetaminophen (ACETAMINOPHEN 8 HOUR) 650 MG CR tablet Take 650 mg by mouth every 8 (eight) hours.   Yes [provider]  cinacalcet (SENSIPAR) 30 MG tablet Take 1 tablet (30 mg total) by mouth Every Tuesday,Thursday,and Saturday with dialysis. 02/25/17  Yes Debbe Odea, MD  clopidogrel (PLAVIX) 75  MG tablet TAKE 75 mg TABLET BY MOUTH EVERY DAY WITH BREAKFAST 02/22/17  Yes Debbe Odea, MD  escitalopram (LEXAPRO) 10 MG tablet Take 1 tablet by mouth daily in evening Patient taking differently: Take 10 mg by mouth at bedtime. Take 1 tablet by mouth daily in evening 11/05/16   Yes Shawnee Knapp, MD  lanthanum (FOSRENOL) 1000 MG chewable tablet Chew 1 tablet by mouth 3 (three) times daily.    Yes [provider]  multivitamin (RENA-VIT) TABS tablet Take 1 tablet by mouth daily.   Yes [provider]  senna-docusate (SENOKOT-S) 8.6-50 MG tablet Take 1 tablet by mouth at bedtime as needed for moderate constipation. Patient taking differently: Take 2 tablets by mouth at bedtime as needed for moderate constipation.  02/22/17  Yes Debbe Odea, MD  aspirin 325 MG tablet Take 1 tablet (325 mg total) by mouth every evening. 02/22/17   Debbe Odea, MD  atorvastatin (LIPITOR) 80 MG tablet Take 1 tablet (80 mg total) by mouth daily at 6 PM. Patient not taking: Reported on 03/21/2017 02/22/17   Debbe Odea, MD                                                                                                                                    Past Surgical History Past Surgical History:  Procedure Laterality Date  . A/V FISTULAGRAM N/A 01/17/2017   Procedure: A/V Fistulagram - Left arm;  Surgeon: Elam Dutch, MD;  Location: [facility];  Service: Cardiovascular;  Laterality: N/A;  . AV FISTULA PLACEMENT Left 09/27/2015   Procedure: RADIOCEPHALIC ARTERIOVENOUS (AV) FISTULA CREATION LEFT ARM;  Surgeon: Mal Misty, MD;  Location: [facility];  Service: Vascular;  Laterality: Left;  . AV FISTULA PLACEMENT Left 01/29/2017   Procedure: ARTERIOVENOUS (AV) FISTULA CREATION BRACHIOCEPHALIC;  Surgeon: Elam Dutch, MD;  Location: [facility] OR;  Service: Vascular;  Laterality: Left;  . AV FISTULA PLACEMENT Left 04/02/2017   Procedure: ARTERIOVENOUS (AV) FISTULA CREATION LEFT BRACHIOCEPHALIC AND LIGATION LEFT RADIOCEPHALIC;  Surgeon: Elam Dutch, MD;  Location: [facility];  Service: Vascular;  Laterality: Left;  . COLONOSCOPY  6-7 yrs ago  . COLONOSCOPY N/A 04/05/2013   Procedure: COLONOSCOPY;  Surgeon: Juanita Craver, MD;  Location: [facility];  Service: Endoscopy;   Laterality: N/A;  . LIGATION OF ARTERIOVENOUS  FISTULA Left 01/29/2017   Procedure: LIGATION OF RADIOCEPHALIC ARTERIOVENOUS  FISTULA;  Surgeon: Elam Dutch, MD;  Location: [facility] OR;  Service: Vascular;  Laterality: Left;   Family History Family History  Problem Relation Age of Onset  . Diabetes Mother   . Hypertension Mother   . Diabetes Father   . Hypertension Father   . Asthma Son   . Diabetes Sister   . Diabetes Brother     Social History Social History  Substance Use Topics  . Smoking status: Former Smoker    Packs/day: 0.25  Years: 2.00    Types: Cigarettes    Quit date: 06/17/1970  . Smokeless tobacco: Never Used  . Alcohol use No   Allergies Patient has no known allergies.  Review of Systems Review of Systems  Unable to perform ROS: Mental status change    Physical Exam Vital Signs  I have reviewed the triage vital signs BP 120/70 (BP Location: Right Arm)   Pulse 76   Temp 97.8 F (36.6 C) (Oral)   Resp 20   SpO2 96%   Physical Exam  Constitutional: He appears well-developed and well-nourished. No distress.  Morbidly obese  HENT:  Head: Normocephalic and atraumatic.  Nose: Nose normal.  Eyes: Pupils are equal, round, and reactive to light. Conjunctivae and EOM are normal. Right eye exhibits no discharge. Left eye exhibits no discharge. No scleral icterus.  Neck: Normal range of motion. Neck supple.  Cardiovascular: Normal rate and regular rhythm.  Exam reveals no gallop and no friction rub.   No murmur heard. Pulmonary/Chest: Effort normal and breath sounds normal. No stridor. No respiratory distress. He has no rales.    Abdominal: Soft. He exhibits no distension. There is no tenderness.  Musculoskeletal: He exhibits no edema or tenderness.  Neurological: He is alert. He is disoriented.  Able to follow commands.  5 out of 5 strength and symmetric in upper extremities.  3 out of 5 strength and symmetric in bilateral lower extremities.  Sensation  intact.  Rest of the neuro exam defer to neurology.  Skin: Skin is warm and dry. No rash noted. He is not diaphoretic. No erythema.  Psychiatric: He has a normal mood and affect.  Vitals reviewed.   ED Results and Treatments Labs (all labs ordered are listed, but only abnormal results are displayed) Labs Reviewed  APTT - Abnormal; Notable for the following:       Result Value   aPTT 39 (*)    All other components within normal limits  CBC - Abnormal; Notable for the following:    RBC 3.88 (*)    Hemoglobin 12.2 (*)    HCT 37.0 (*)    RDW 18.3 (*)    All other components within normal limits  COMPREHENSIVE METABOLIC PANEL - Abnormal; Notable for the following:    Potassium 3.0 (*)    Chloride 95 (*)    Glucose, Bld 116 (*)    BUN 24 (*)    Creatinine, Ser 4.16 (*)    Calcium 8.6 (*)    GFR calc non Af Amer 14 (*)    GFR calc Af Amer 16 (*)    All other components within normal limits  I-STAT CHEM 8, ED - Abnormal; Notable for the following:    Potassium 3.2 (*)    Chloride 95 (*)    BUN 28 (*)    Creatinine, Ser 4.10 (*)    Glucose, Bld 120 (*)    Calcium, Ion 0.95 (*)    All other components within normal limits  ETHANOL  PROTIME-INR  DIFFERENTIAL  RAPID URINE DRUG SCREEN, HOSP PERFORMED  URINALYSIS, ROUTINE W REFLEX MICROSCOPIC  AMMONIA  I-STAT TROPONIN, ED  EKG  EKG Interpretation  Date/Time:    Ventricular Rate:    PR Interval:    QRS Duration:   QT Interval:    QTC Calculation:   R Axis:     Text Interpretation:        Radiology Ct Head Code Stroke Wo Contrast  Result Date: 04/15/2017 CLINICAL DATA:  Code stroke.  Aphasia.  History of stroke. EXAM: CT HEAD WITHOUT CONTRAST TECHNIQUE: Contiguous axial images were obtained from the base of the skull through the vertex without intravenous contrast. COMPARISON:  CT head 02/27/2017.  MR  head 02/20/2017. FINDINGS: Brain: No evidence for acute infarction, hemorrhage, mass lesion, hydrocephalus, or extra-axial fluid. Generalized atrophy. Chronic microvascular ischemic change. Vascular: Calcification of the cavernous internal carotid arteries and distal vertebral arteries consistent with cerebrovascular atherosclerotic disease. No signs of intracranial large vessel occlusion. Skull: Calvarium intact. Sinuses/Orbits: No sinus or mastoid disease. BILATERAL lenticular opacities consistent cataracts. Other: None. ASPECTS [facility] Stroke Program Early CT Score) - Ganglionic level infarction (caudate, lentiform nuclei, internal capsule, insula, M1-M3 cortex): 7 - Supraganglionic infarction (M4-M6 cortex): 3 Total score (0-10 with 10 being normal): 10 IMPRESSION: 1. Atrophy and small vessel disease. Cerebrovascular atherosclerotic change. No signs of acute infarction or intracranial large vessel occlusion. Stable appearance from priors. 2. ASPECTS is 10. These results were called by telephone at the time of interpretation on 04/15/2017 at 6:03 pm to Dr. Amie Portland , who verbally acknowledged these results. Electronically Signed   By: Staci Righter M.D.   On: 04/15/2017 18:03   Pertinent labs & imaging results that were available during my care of the patient were reviewed by me and considered in my medical decision making (see chart for details).  Medications Ordered in ED Medications - No data to display                                                                                                                                  Procedures Procedures  (including critical care time)  Medical Decision Making / ED Course I have reviewed the nursing notes for this encounter and the patient's prior records (if available in EHR or on provided paperwork).    Code stroke in route.  On arrival patient is disoriented to time, location.  He is a phasic.  NIH score 47.  Neurology evaluate the patient  and determined that he is not a good TPA candidate.  CT head without evidence of ICH.  Labs grossly reassuring and not specific for any particular etiology.  They believe this may be stroke versus TIA versus post dialysis disequilibrium.  They recommended observation for TIA rule out.  Will discuss case with medicine for admission.  Final Clinical Impression(s) / ED Diagnoses Final diagnoses:  Aphasia  Disorientation      This chart was dictated using voice recognition software.  Despite best efforts to proofread,  errors can occur which can change the documentation  meaning.   Fatima Blank, MD 04/15/17 [phone]

## 2017-04-15 NOTE — Code Documentation (Signed)
Patient s/p dialysis treatment.  Per staff he was normally conversant at 1700.  When his transportation arrived he had difficulty finding his words and slurred speech.  EMS called Code Stroke en route at 1729.  Patient arrived at 1740  Stat head CT and labs done.  NIHSS 4, unable to state month, mild facial droop, mild aphasia, mild dysarthria.  Dr Rory Percy at bedside to assess patient.  Not a TPA candidate symptoms mild and improving.  Plan admit other.

## 2017-04-15 NOTE — H&P (Addendum)
History and Physical    [employer] Sr. JJO:[id number] DOB: [birth year] DOA: 04/15/2017  Referring MD/NP/PA:   PCP: Shawnee Knapp, MD   Patient coming from:  The patient is coming from home.  At baseline, pt is independent for most of ADL.   Chief Complaint: aphasia and confusion   HPI: Blake Mester Sr. is a 66 y.o. male with medical history significant of hypertension, hyperlipidemia, diet controlled diabetes mellitus, stroke, depression, dCHF, OSA on CPAP, ESRD-HD (TTS), anemia, who presents with aphasia and confusion.  Per report, pt developed sudden onset of confusion and aphasia, which started at 1700 following completion of his dialysis session.  Patient was waiting on transportation back home when his symptoms began suddenly. When I saw pt in ED, he has difficulty speaking. He could only say yes or no to my questions. He seems to be able to understand my questions clearly. He denies any chest pain, abdominal pain, or any pain anywhere. No nausea, vomiting, diarrhea, active cough noted. He moves all extremities normally. He denies numbness or tingliness in extremities. No facial droop, vision change or hearing loss. No fever or chills.  ED Course: pt was found to have WBC 8.4, INR 1.05, PTT 39, potassium is 3.0, bicarbonate 30, creatinine 4.16, BUN 24, temperature normal, no tachycardia, oxygen saturation 96% on room air, negative CT head for acute intracranial abnormalities. Patient is admitted to telemetry bed as inpatient. Urology, Dr. Rory Percy was consulted.  Review of Systems:   General: no fevers, chills, no body weight gain, has poor appetite, has fatigue HEENT: no blurry vision, hearing changes or sore throat Respiratory: no dyspnea, coughing, wheezing CV: no chest pain, no palpitations GI: no nausea, vomiting, abdominal pain, diarrhea, constipation GU: no dysuria, burning on urination, increased urinary frequency, hematuria  Ext: no leg edema Neuro: no unilateral  weakness, numbness, or tingling, no vision change or hearing loss. Has aphasia. Skin: no rash, no skin tear. MSK: No muscle spasm, no deformity, no limitation of range of movement in spin Heme: No easy bruising.  Travel history: No recent long distant travel.  Allergy: No Known Allergies  Past Medical History:  Diagnosis Date  . Anemia   . CVA (cerebral vascular accident) [facility]) 12/2013   admitted in July 2015 for acute right-sided infarct/notes 12/02/2014- 01/28/17-  uses walker   . ESRD (end stage renal disease) ([place])    Hemo TTHSat  . Hypercholesterolemia   . Hypertension   . Multiple wounds    on lower legs - sees [name] Pain at the [facility]  . OSA on CPAP    uses cpap, setting of 5, 01/28/17- does not use any longer  . 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 Stroke ([place]) 12/02/2014   expressive aphasia 01/28/17- sometimes  . Systolic CHF ([place])   . Type II diabetes mellitus ([place])    uncontrolled/notes 12/02/2014    Past Surgical History:  Procedure Laterality Date  . A/V FISTULAGRAM N/A 01/17/2017   Procedure: A/V Fistulagram - Left arm;  Surgeon: Elam Dutch, MD;  Location: [place] CV LAB;  Service: Cardiovascular;  Laterality: N/A;  . AV FISTULA PLACEMENT Left 09/27/2015   Procedure: RADIOCEPHALIC ARTERIOVENOUS (AV) FISTULA CREATION LEFT ARM;  Surgeon: Mal Misty, MD;  Location: [place];  Service: Vascular;  Laterality: Left;  . AV FISTULA PLACEMENT Left 01/29/2017   Procedure: ARTERIOVENOUS (AV) FISTULA CREATION BRACHIOCEPHALIC;  Surgeon: Elam Dutch, MD;  Location: [place];  Service:  Vascular;  Laterality: Left;  . AV FISTULA PLACEMENT Left 04/02/2017   Procedure: ARTERIOVENOUS (AV) FISTULA CREATION LEFT BRACHIOCEPHALIC AND LIGATION LEFT RADIOCEPHALIC;  Surgeon: Elam Dutch, MD;  Location: [place];  Service: Vascular;  Laterality: Left;  . COLONOSCOPY  6-7 yrs ago  . COLONOSCOPY N/A 04/05/2013   Procedure: COLONOSCOPY;  Surgeon:  Juanita Craver, MD;  Location: [facility];  Service: Endoscopy;  Laterality: N/A;  . LIGATION OF ARTERIOVENOUS  FISTULA Left 01/29/2017   Procedure: LIGATION OF RADIOCEPHALIC ARTERIOVENOUS  FISTULA;  Surgeon: Elam Dutch, MD;  Location: [facility] OR;  Service: Vascular;  Laterality: Left;    Social History:  reports that he quit smoking about 46 years ago. His smoking use included Cigarettes. He has a 0.50 pack-year smoking history. He has never used smokeless tobacco. He reports that he uses drugs, including Marijuana. He reports that he does not drink alcohol.  Family History:  Family History  Problem Relation Age of Onset  . Diabetes Mother   . Hypertension Mother   . Diabetes Father   . Hypertension Father   . Asthma Son   . Diabetes Sister   . Diabetes Brother      Prior to Admission medications   Medication Sig Start Date End Date Taking? Authorizing Provider  acetaminophen (ACETAMINOPHEN 8 HOUR) 650 MG CR tablet Take 650 mg by mouth every 8 (eight) hours.   Yes [provider]  cinacalcet (SENSIPAR) 30 MG tablet Take 1 tablet (30 mg total) by mouth Every Tuesday,Thursday,and Saturday with dialysis. 02/25/17  Yes Debbe Odea, MD  clopidogrel (PLAVIX) 75 MG tablet TAKE 75 mg TABLET BY MOUTH EVERY DAY WITH BREAKFAST 02/22/17  Yes Debbe Odea, MD  escitalopram (LEXAPRO) 10 MG tablet Take 1 tablet by mouth daily in evening Patient taking differently: Take 10 mg by mouth at bedtime. Take 1 tablet by mouth daily in evening 11/05/16  Yes Shawnee Knapp, MD  lanthanum (FOSRENOL) 1000 MG chewable tablet Chew 1 tablet by mouth 3 (three) times daily.    Yes [provider]  multivitamin (RENA-VIT) TABS tablet Take 1 tablet by mouth daily.   Yes [provider]  senna-docusate (SENOKOT-S) 8.6-50 MG tablet Take 1 tablet by mouth at bedtime as needed for moderate constipation. Patient taking differently: Take 2 tablets by mouth at bedtime as needed for moderate constipation.   02/22/17  Yes Debbe Odea, MD  aspirin 325 MG tablet Take 1 tablet (325 mg total) by mouth every evening. 02/22/17   Debbe Odea, MD  atorvastatin (LIPITOR) 80 MG tablet Take 1 tablet (80 mg total) by mouth daily at 6 PM. Patient not taking: Reported on 03/21/2017 02/22/17   Debbe Odea, MD    Physical Exam: Vitals:   04/15/17 1845 04/15/17 1915 04/15/17 1930 04/15/17 2000  BP: (!) 145/83 (!) 156/88 (!) 158/77 (!) 158/95  Pulse: 72 76 77 77  Resp: 17 20 19 17   Temp:      TempSrc:      SpO2: 96% 98% 96% 97%  Weight:       General: Not in acute distress HEENT:       Eyes: PERRL, EOMI, no scleral icterus.       ENT: No discharge from the ears and nose, no pharynx injection, no tonsillar enlargement.        Neck: No JVD, no bruit, no mass felt. Heme: No neck lymph node enlargement. Cardiac: S1/S2, RRR, No murmurs, No gallops or rubs. Respiratory: No rales, wheezing, rhonchi  or rubs. GI: Soft, nondistended, nontender, no rebound pain, no organomegaly, BS present. GU: No hematuria Ext: No pitting leg edema bilaterally. 2+DP/PT pulse bilaterally. Musculoskeletal: No joint deformities, No joint redness or warmth, no limitation of ROM in spin. Skin: No rashes.  Neuro: Alert, has aphasia, oriented X3 ( he agreed with me when I say he is in [facility] hospital, year 2018 and Trump is currently present), cranial nerves II-XII grossly intact, moves all extremities normally. Muscle strength 5/5 in all extremities, sensation to light touch intact. Brachial reflex 2+ bilaterally. Negative Babinski's sign. Psych: Patient is not psychotic, no suicidal or hemocidal ideation.  Labs on Admission: I have personally reviewed following labs and imaging studies  CBC:  Recent Labs Lab 04/15/17 1757 04/15/17 1814  WBC  --  8.4  NEUTROABS  --  4.3  HGB 13.9 12.2*  HCT 41.0 37.0*  MCV  --  95.4  PLT  --  093   Basic Metabolic Panel:  Recent Labs Lab 04/15/17 1757 04/15/17 1814  NA 140 137  K 3.2*  3.0*  CL 95* 95*  CO2  --  30  GLUCOSE 120* 116*  BUN 28* 24*  CREATININE 4.10* 4.16*  CALCIUM  --  8.6*   GFR: Estimated Creatinine Clearance: 24.9 mL/min (A) (by C-G formula based on SCr of 4.16 mg/dL (H)). Liver Function Tests:  Recent Labs Lab 04/15/17 1814  AST 21  ALT 21  ALKPHOS 96  BILITOT 0.5  PROT 7.6  ALBUMIN 3.5   No results for input(s): LIPASE, AMYLASE in the last 168 hours. No results for input(s): AMMONIA in the last 168 hours. Coagulation Profile:  Recent Labs Lab 04/15/17 1814  INR 1.05   Cardiac Enzymes: No results for input(s): CKTOTAL, CKMB, CKMBINDEX, TROPONINI in the last 168 hours. BNP (last 3 results) No results for input(s): PROBNP in the last 8760 hours. HbA1C: No results for input(s): HGBA1C in the last 72 hours. CBG: No results for input(s): GLUCAP in the last 168 hours. Lipid Profile: No results for input(s): CHOL, HDL, LDLCALC, TRIG, CHOLHDL, LDLDIRECT in the last 72 hours. Thyroid Function Tests: No results for input(s): TSH, T4TOTAL, FREET4, T3FREE, THYROIDAB in the last 72 hours. Anemia Panel: No results for input(s): VITAMINB12, FOLATE, FERRITIN, TIBC, IRON, RETICCTPCT in the last 72 hours. Urine analysis:    Component Value Date/Time   COLORURINE YELLOW 05/29/2015 2130   APPEARANCEUR CLEAR 05/29/2015 2130   LABSPEC 1.017 05/29/2015 2130   PHURINE 6.0 05/29/2015 2130   GLUCOSEU NEGATIVE 05/29/2015 2130   HGBUR TRACE (A) 05/29/2015 2130   BILIRUBINUR negative 07/09/2016 1500   KETONESUR negative 07/09/2016 1500   KETONESUR NEGATIVE 05/29/2015 2130   PROTEINUR >=300 (A) 07/09/2016 1500   PROTEINUR >300 (A) 05/29/2015 2130   UROBILINOGEN 0.2 07/09/2016 1500   UROBILINOGEN 0.2 01/13/2014 2036   NITRITE Negative 07/09/2016 1500   NITRITE NEGATIVE 05/29/2015 2130   LEUKOCYTESUR Negative 07/09/2016 1500   Sepsis Labs: @LABRCNTIP (procalcitonin:4,lacticidven:4) )No results found for this or any previous visit (from the past  240 hour(s)).   Radiological Exams on Admission: Ct Head Code Stroke Wo Contrast  Result Date: 04/15/2017 CLINICAL DATA:  Code stroke.  Aphasia.  History of stroke. EXAM: CT HEAD WITHOUT CONTRAST TECHNIQUE: Contiguous axial images were obtained from the base of the skull through the vertex without intravenous contrast. COMPARISON:  CT head 02/27/2017.  MR head 02/20/2017. FINDINGS: Brain: No evidence for acute infarction, hemorrhage, mass lesion, hydrocephalus, or extra-axial fluid. Generalized atrophy. Chronic microvascular  ischemic change. Vascular: Calcification of the cavernous internal carotid arteries and distal vertebral arteries consistent with cerebrovascular atherosclerotic disease. No signs of intracranial large vessel occlusion. Skull: Calvarium intact. Sinuses/Orbits: No sinus or mastoid disease. BILATERAL lenticular opacities consistent cataracts. Other: None. ASPECTS [facility] Stroke Program Early CT Score) - Ganglionic level infarction (caudate, lentiform nuclei, internal capsule, insula, M1-M3 cortex): 7 - Supraganglionic infarction (M4-M6 cortex): 3 Total score (0-10 with 10 being normal): 10 IMPRESSION: 1. Atrophy and small vessel disease. Cerebrovascular atherosclerotic change. No signs of acute infarction or intracranial large vessel occlusion. Stable appearance from priors. 2. ASPECTS is 10. These results were called by telephone at the time of interpretation on 04/15/2017 at 6:03 pm to Dr. Amie Portland , who verbally acknowledged these results. Electronically Signed   By: Staci Righter M.D.   On: 04/15/2017 18:03     EKG: Independently reviewed.  Sinus rhythm, QTC 482, LAD, PA-C, T-wave flattening   Assessment/Plan Principal Problem:   Stroke (cerebrum) (HCC) Active Problems:   OSA (obstructive sleep apnea)   Essential hypertension, benign   HLD (hyperlipidemia)   Type II diabetes mellitus with neurological manifestations (HCC)   Chronic diastolic heart failure, NYHA class 2  (HCC)   Expressive aphasia   Acute metabolic encephalopathy   Aphasia   ESRD on dialysis (TTS):   Hypokalemia   Hx of Stroke and new aphasia: CT head is negative for acute intracranial abnormalities. Neurology, Dr.Arora was consulted--> recommended stroke workup. DD include new stroke, TIA and dialysis disequilibrium syndrome per Dr. Rory Percy.  -will admit to tele bed as inpt -highly appreciate Dr. Johny Chess consultation, will follow up recommendations as follows:  -Telemetry monitoring  -Allow for permissive hypertension for the first 24-48h - only treat PRN if SBP >220 mmHg. Blood pressures can be gradually normalized to SBP<140 upon discharge.  -MRI, MRA  of the brain without contrast  -Carotid dopplers  -Echocardiogram  -HgbA1c, fasting lipid panel  -Frequent neuro checks  -Prophylactic therapy-c/w ASA and PLAVIX  -Atorvastatin 80 mg PO daily  -Risk factor modification  -PT consult, OT consult, Speech consult  HLD: -Lipitor  HTN: bp 145/83, not taking medications at home -IV prn hydralazine for blood pressure>220  ESRD-HD (TTS): had HD today. Potassium is 3.0, bicarbonate 30, creatinine 4.16, BUN 24 -Left message to renal box for HD -Continue Sensipar, Fosrenol, Renvela   Chronic diastolic heart failure, NYHA class 2 ([place]): 2-D echo on 02/23/17 showed EF 65-70% with grade 1 diastolic dysfunction. Patient does not have leg edema or JVD. CHF is compensated. 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 Patient is not taking meds at home -check cbg qAM  Depression: Stable, no suicidal or homicidal ideations. -Continue home medications: Lexapro  Acute metabolic encephalopathy: seems to have resolved. Likely due to possible stroke/TIA. -Frequent neurologic checks -f/u ammonia level  Addendum: Ammonia level slightly increased,  40. LFT normal. I do not think pt has hepatic encephalopathy, but will start lactulose to lower the ammonia level. -lactulose 25 g bid  Hypokalemia: K= 3.0  on admission. - Repleted - Check Mg level   DVT ppx: SQ Heparin  Code Status: Full code Family Communication: None at bed side.   Disposition Plan:  Anticipate discharge back to previous home environment Consults called:  Neuro, Dr. Rory Percy Admission status:   Inpatient/tele      Date of Service 04/15/2017  Ivor Costa [facility] Pager [phone]  If 7PM-7AM, please contact night-coverage [email or web address] Password [email or web address] 04/15/2017, 9:46 PM

## 2017-04-15 NOTE — ED Notes (Signed)
Pt unable to provide urine sample. Pt states because of dialysis he has a small amount of urine output

## 2017-04-16 ENCOUNTER — Inpatient Hospital Stay (HOSPITAL_COMMUNITY): Payer: Medicare (Managed Care)

## 2017-04-16 ENCOUNTER — Other Ambulatory Visit (HOSPITAL_COMMUNITY): Payer: Medicare (Managed Care)

## 2017-04-16 ENCOUNTER — Encounter (HOSPITAL_COMMUNITY): Payer: Self-pay | Admitting: *Deleted

## 2017-04-16 DIAGNOSIS — Z992 Dependence on renal dialysis: Secondary | ICD-10-CM

## 2017-04-16 DIAGNOSIS — R4701 Aphasia: Secondary | ICD-10-CM

## 2017-04-16 DIAGNOSIS — Z7902 Long term (current) use of antithrombotics/antiplatelets: Secondary | ICD-10-CM

## 2017-04-16 DIAGNOSIS — Z9989 Dependence on other enabling machines and devices: Secondary | ICD-10-CM

## 2017-04-16 DIAGNOSIS — E1122 Type 2 diabetes mellitus with diabetic chronic kidney disease: Secondary | ICD-10-CM

## 2017-04-16 DIAGNOSIS — Z79899 Other long term (current) drug therapy: Secondary | ICD-10-CM

## 2017-04-16 DIAGNOSIS — I639 Cerebral infarction, unspecified: Principal | ICD-10-CM

## 2017-04-16 DIAGNOSIS — F329 Major depressive disorder, single episode, unspecified: Secondary | ICD-10-CM

## 2017-04-16 DIAGNOSIS — Z7982 Long term (current) use of aspirin: Secondary | ICD-10-CM

## 2017-04-16 DIAGNOSIS — I6300 Cerebral infarction due to thrombosis of unspecified precerebral artery: Secondary | ICD-10-CM

## 2017-04-16 DIAGNOSIS — N186 End stage renal disease: Secondary | ICD-10-CM

## 2017-04-16 DIAGNOSIS — Z993 Dependence on wheelchair: Secondary | ICD-10-CM

## 2017-04-16 LAB — VAS US CAROTID
LCCADDIAS: -9 cm/s
LEFT ECA DIAS: -40 cm/s
LEFT VERTEBRAL DIAS: -7 cm/s
LICADDIAS: -20 cm/s
LICAPSYS: -40 cm/s
Left CCA dist sys: -64 cm/s
Left CCA prox dias: -11 cm/s
Left CCA prox sys: -120 cm/s
Left ICA dist sys: -86 cm/s
Left ICA prox dias: -9 cm/s
RCCADSYS: -52 cm/s
RIGHT CCA MID DIAS: -11 cm/s
RIGHT ECA DIAS: -31 cm/s
RIGHT VERTEBRAL DIAS: -14 cm/s
Right CCA prox dias: 9 cm/s
Right CCA prox sys: 83 cm/s

## 2017-04-16 LAB — LIPID PANEL
CHOLESTEROL: 175 mg/dL (ref 0–200)
HDL: 46 mg/dL (ref >40)
LDL Cholesterol: 101 mg/dL — ABNORMAL HIGH (ref 0–99)
TRIGLYCERIDES: 138 mg/dL (ref <150)
Total CHOL/HDL Ratio: 3.8 RATIO
VLDL: 28 mg/dL (ref 0–40)

## 2017-04-16 LAB — BASIC METABOLIC PANEL
ANION GAP: 12 (ref 5–15)
BUN: 34 mg/dL — AB (ref 6–20)
CALCIUM: 8.7 mg/dL — AB (ref 8.9–10.3)
CO2: 28 mmol/L (ref 22–32)
Chloride: 97 mmol/L — ABNORMAL LOW (ref 101–111)
Creatinine, Ser: 5.42 mg/dL — ABNORMAL HIGH (ref 0.61–1.24)
GFR calc Af Amer: 11 mL/min — ABNORMAL LOW (ref >60)
GFR, EST NON AFRICAN AMERICAN: 10 mL/min — AB (ref >60)
GLUCOSE: 114 mg/dL — AB (ref 65–99)
Potassium: 3.4 mmol/L — ABNORMAL LOW (ref 3.5–5.1)
SODIUM: 137 mmol/L (ref 135–145)

## 2017-04-16 LAB — GLUCOSE, CAPILLARY
GLUCOSE-CAPILLARY: 116 mg/dL — AB (ref 65–99)
Glucose-Capillary: 113 mg/dL — ABNORMAL HIGH (ref 65–99)
Glucose-Capillary: 214 mg/dL — ABNORMAL HIGH (ref 65–99)
Glucose-Capillary: 135 mg/dL — ABNORMAL HIGH (ref 65–99)

## 2017-04-16 LAB — HEMOGLOBIN A1C
Hgb A1c MFr Bld: 6.8 % — ABNORMAL HIGH (ref 4.8–5.6)
Mean Plasma Glucose: 148.46 mg/dL

## 2017-04-16 LAB — MAGNESIUM: MAGNESIUM: 2 mg/dL (ref 1.7–2.4)

## 2017-04-16 MED ORDER — POTASSIUM CHLORIDE CRYS ER 20 MEQ PO TBCR
40.0000 meq | EXTENDED_RELEASE_TABLET | Freq: Once | ORAL | Status: AC
  Administered 2017-04-16: 40 meq via ORAL
  Filled 2017-04-16: qty 2

## 2017-04-16 MED ORDER — PRO-STAT SUGAR FREE PO LIQD
30.0000 mL | Freq: Two times a day (BID) | ORAL | Status: DC
  Administered 2017-04-16 – 2017-04-18 (×4): 30 mL via ORAL
  Filled 2017-04-16 (×5): qty 30

## 2017-04-16 MED ORDER — LACTULOSE 10 GM/15ML PO SOLN
20.0000 g | Freq: Two times a day (BID) | ORAL | Status: DC
  Administered 2017-04-16 – 2017-04-17 (×4): 20 g via ORAL
  Filled 2017-04-16 (×4): qty 30

## 2017-04-16 MED ORDER — CINACALCET HCL 30 MG PO TABS
60.0000 mg | ORAL_TABLET | ORAL | Status: DC
  Administered 2017-04-17: 60 mg via ORAL
  Filled 2017-04-16: qty 2

## 2017-04-16 NOTE — Progress Notes (Signed)
  Echocardiogram 2D Echocardiogram has been attempted. Patient refused exam because he did not know the cost of the exam.  Blake Bell [name] 04/16/2017, 3:51 PM

## 2017-04-16 NOTE — Evaluation (Signed)
Physical Therapy Evaluation Patient Details Name: Blake Siebert Sr. MRN: [id number] DOB: [birth year] Today's Date: 04/16/2017   History of Present Illness  66 y.o. male with medical history significant of hypertension, hyperlipidemia, diet controlled diabetes mellitus, stroke, depression, dCHF, OSA on CPAP, ESRD-HD (TTS), anemia, who presents with aphasia and confusion.  MRI revealed Subcentimeter acute/early subacute infarction within the right parietal subcortical white matter.  Clinical Impression  Pt seems to be close to his baseline with most significant new deficit related to his speech.  PT will follow acutely and recommend that he resume PACE day program at discharge.   PT to follow acutely for deficits listed below.     Follow Up Recommendations Other (comment) (resume PACE at d/c)    Equipment Recommendations  None recommended by PT    Recommendations for Other Services   NA    Precautions / Restrictions Precautions Precautions: Fall      Mobility  Bed Mobility Overal bed mobility: Needs Assistance Bed Mobility: Supine to Sit     Supine to sit: HOB elevated;Min assist     General bed mobility comments: Min assist to help progress LEs over EOB and boost up using bed rail.   Transfers Overall transfer level: Needs assistance Equipment used: Rolling walker (2 wheeled) Transfers: Sit to/from Stand Sit to Stand: Min guard         General transfer comment: Min guard assist for safety.  Verbal cues for safe hand placement during transitions.   Ambulation/Gait Ambulation/Gait assistance: Min guard Ambulation Distance (Feet): 150 Feet Assistive device: Rolling walker (2 wheeled) Gait Pattern/deviations: Step-through pattern;Trunk flexed   Gait velocity interpretation: Below normal speed for age/gender       Modified Rankin (Stroke Patients Only) Modified Rankin (Stroke Patients Only) Pre-Morbid Rankin Score: Moderate disability Modified Rankin:  Moderately severe disability     Balance Overall balance assessment: Needs assistance Sitting-balance support: Feet supported;No upper extremity supported Sitting balance-Leahy Scale: Good     Standing balance support: No upper extremity supported Standing balance-Leahy Scale: Fair Standing balance comment: supervision for static standing, dynamic activities require an AD and external assist.                              Pertinent Vitals/Pain Pain Assessment: No/denies pain    Home Living Family/patient expects to be discharged to:: Private residence Living Arrangements: Spouse/significant other Available Help at Discharge: Family Type of Home: House Home Access: Level entry     Home Layout: One [place]: Environmental consultant - 2 wheels;Shower seat;Toilet riser Additional Comments: Attends PACE every day, HD 3x per week    Prior Function Level of Independence: Independent with assistive device(s)         Comments: RW for mobility     Hand Dominance   Dominant Hand: Right    Extremity/Trunk Assessment   Upper Extremity Assessment Upper Extremity Assessment: Defer to OT evaluation    Lower Extremity Assessment Lower Extremity Assessment: RLE deficits/detail;LLE deficits/detail RLE Deficits / Details: at least 4/5 per gross seated MMT at ankle, knee, hip RLE Sensation: history of peripheral neuropathy LLE Deficits / Details: at least 4/5 per gross seated MMT at ankle, knee, hip LLE Sensation: history of peripheral neuropathy    Cervical / Trunk Assessment Cervical / Trunk Assessment: Kyphotic  Communication   Communication: Expressive difficulties  Cognition Arousal/Alertness: Awake/alert Behavior During Therapy: Impulsive Overall Cognitive Status: History of cognitive impairments - at baseline  General Comments: No family present to report baseline.              Assessment/Plan    PT  Assessment Patient needs continued PT services  PT Problem List Decreased strength;Decreased activity tolerance;Decreased mobility;Decreased balance;Decreased knowledge of use of DME;Decreased safety awareness;Impaired sensation;Obesity       PT Treatment Interventions DME instruction;Gait training;Functional mobility training;Therapeutic activities;Therapeutic exercise;Balance training;Neuromuscular re-education;Cognitive remediation;Patient/family education    PT Goals (Current goals can be found in the Care Plan section)  Acute Rehab PT Goals Patient Stated Goal: return home PT Goal Formulation: With patient Time For Goal Achievement: 04/30/17 Potential to Achieve Goals: Good    Frequency Min 4X/week           AM-PAC PT "6 Clicks" Daily Activity  Outcome Measure Difficulty turning over in bed (including adjusting bedclothes, sheets and blankets)?: Unable Difficulty moving from lying on back to sitting on the side of the bed? : Unable Difficulty sitting down on and standing up from a chair with arms (e.g., wheelchair, bedside commode, etc,.)?: Unable Help needed moving to and from a bed to chair (including a wheelchair)?: A Little Help needed walking in hospital room?: A Little Help needed climbing 3-5 steps with a railing? : A Lot 6 Click Score: 11    End of Session Equipment Utilized During Treatment: Gait belt;Other (comment) (we donned his shoes for gait) Activity Tolerance: Patient limited by fatigue Patient left: in chair;with call bell/phone within reach;with chair alarm set Nurse Communication: Mobility status PT Visit Diagnosis: Muscle weakness (generalized) (M62.81);Other symptoms and signs involving the nervous system (I68.032)    Time: 1224-8250 PT Time Calculation (min) (ACUTE ONLY): 21 min   Charges:      Wells Guiles B. Casy Tavano, PT, DPT [phone]   PT Evaluation $PT Eval Moderate Complexity: 1 Mod     04/16/2017, 5:13 PM

## 2017-04-16 NOTE — Progress Notes (Signed)
Carotid Duplex complete, please see Results Review or CV Proc tab for preliminary report.  [place], RVT 3:52 PM  04/16/2017

## 2017-04-16 NOTE — Evaluation (Signed)
Occupational Therapy Evaluation Patient Details Name: Blake Guyette Sr. MRN: [id number] DOB: 1950-10-26 Today's Date: 04/16/2017    History of Present Illness 66 y.o. male with medical history significant of hypertension, hyperlipidemia, diet controlled diabetes mellitus, stroke, depression, dCHF, OSA on CPAP, ESRD-HD (TTS), anemia, who presents with aphasia and confusion.   Clinical Impression   Pt reports he was mod I with ADL PTA. Currently pt overall min guard for ADL and functional mobility. Pt presenting with speech deficits, impaired cognition, and balance deficits impacting his independence and safety with ADL and functional mobility. Pt planning to d/c home with supervision from family. Recommend return to [facility] upon return home. Pt would benefit from continued skilled OT to address established goals.    Follow Up Recommendations  No OT follow up;Supervision/Assistance - 24 hour (return to [facility] day center)    Equipment Recommendations  None recommended by OT    Recommendations for Other Services PT consult     Precautions / Restrictions Precautions Precautions: Fall Restrictions Weight Bearing Restrictions: No      Mobility Bed Mobility               General bed mobility comments: Pt OOB upon arrival  Transfers Overall transfer level: Needs assistance Equipment used: Rolling walker (2 wheeled) Transfers: Sit to/from Stand Sit to Stand: Min guard         General transfer comment: Min guard for safety. Impulsive with transfers    Balance Overall balance assessment: Needs assistance Sitting-balance support: Feet supported;No upper extremity supported Sitting balance-Leahy Scale: Good     Standing balance support: No upper extremity supported;During functional activity Standing balance-Leahy Scale: Fair Standing balance comment: Able to stand at sink to wash hands                           ADL either performed or assessed with clinical  judgement   ADL Overall ADL's : Needs assistance/impaired Eating/Feeding: Set up;Sitting   Grooming: Min guard;Wash/dry hands;Standing   Upper Body Bathing: Set up;Supervision/ safety;Sitting   Lower Body Bathing: Min guard;Sit to/from stand   Upper Body Dressing : Set up;Supervision/safety;Sitting   Lower Body Dressing: Min guard;Sit to/from stand Lower Body Dressing Details (indicate cue type and reason): Able to adjust socks in sitting Toilet Transfer: Min guard;Ambulation;Comfort height toilet;RW   Toileting- Water quality scientist and Hygiene: Min guard;Set up [place] Manipulation Details (indicate cue type and reason): for peri care in standing     Functional mobility during ADLs: Min guard;Rolling walker       Vision         Perception     Praxis      Pertinent Vitals/Pain Pain Assessment: No/denies pain     Hand Dominance Right   Extremity/Trunk Assessment Upper Extremity Assessment Upper Extremity Assessment: Generalized weakness   Lower Extremity Assessment Lower Extremity Assessment: Defer to PT evaluation   Cervical / Trunk Assessment Cervical / Trunk Assessment: Kyphotic   Communication Communication Communication: Expressive difficulties   Cognition Arousal/Alertness: Awake/alert Behavior During Therapy: Impulsive Overall Cognitive Status: No family/caregiver present to determine baseline cognitive functioning                                     General Comments       Exercises     Shoulder Instructions      Home Living Family/patient expects to  be discharged to:: Private residence Living Arrangements: Spouse/significant other Available Help at Discharge: Family Type of Home: House Home Access: Level entry     [place]: One level     Bathroom Shower/Tub: Tub/shower unit;Walk-in shower   Bathroom Toilet: Standard     Home Equipment: Environmental consultant - 2 wheels;Shower seat;Toilet riser   Additional  Comments: Attends [facility] every day, HD 3x per week      Prior Functioning/Environment Level of Independence: Independent with assistive device(s)        Comments: RW for mobility        OT Problem List: Decreased strength;Impaired balance (sitting and/or standing);Decreased activity tolerance;Decreased cognition;Decreased safety awareness;Decreased knowledge of use of DME or AE      OT Treatment/Interventions: Self-care/ADL training;Therapeutic exercise;Neuromuscular education;Energy conservation;DME and/or AE instruction;Therapeutic activities;Cognitive remediation/compensation;Patient/family education;Balance training    OT Goals(Current goals can be found in the care plan section) Acute Rehab OT Goals Patient Stated Goal: return home OT Goal Formulation: With patient Time For Goal Achievement: 04/30/17 Potential to Achieve Goals: Good ADL Goals Pt Will Perform Lower Body Bathing: with modified independence;sit to/from stand Pt Will Perform Lower Body Dressing: with modified independence;sit to/from stand Pt Will Transfer to Toilet: with modified independence;ambulating;regular height toilet Pt Will Perform Toileting - Clothing Manipulation and hygiene: with modified independence;sit to/from stand Pt Will Perform Tub/Shower Transfer: Shower transfer;with modified independence;ambulating;shower seat  OT Frequency: Min 2X/week   Barriers to D/C:            Co-evaluation              AM-PAC PT "6 Clicks" Daily Activity     Outcome Measure Help from another person eating meals?: None Help from another person taking care of personal grooming?: A Little Help from another person toileting, which includes using toliet, bedpan, or urinal?: A Little Help from another person bathing (including washing, rinsing, drying)?: A Little Help from another person to put on and taking off regular upper body clothing?: A Little Help from another person to put on and taking off regular  lower body clothing?: A Little 6 Click Score: 19   End of Session Equipment Utilized During Treatment: Rolling walker Nurse Communication: Mobility status;Other (comment) (pt found in bathroom with lights off)  Activity Tolerance: Patient tolerated treatment well Patient left: in chair;with call bell/phone within reach;Other (comment) (MD present)  OT Visit Diagnosis: Unsteadiness on feet (R26.81);Other abnormalities of gait and mobility (R26.89)                Time: 6063-0160 OT Time Calculation (min): 14 min Charges:  OT General Charges $OT Visit: 1 Visit OT Evaluation $OT Eval Moderate Complexity: 1 Mod G-Codes:     Della Homan A. Ulice Brilliant, M.S., OTR/L Pager: [place] 04/16/2017, 1:15 PM

## 2017-04-16 NOTE — Progress Notes (Signed)
Transfer of service note  Chart briefly reviewed. I briefly interviewed and examined patient. He reports that his PCP is with the [facility]. He has multiple medical problems, HTN, HLD, DM, prior CVA, OSA on CPAP, ESRD on TTS HD who presented with speech difficulties. He states that his speech is slightly better as long as he takes time to speak. Denies any other complaints. Pleasant middle aged male, moderately built and morbidly obese, sitting up in bed and just finished eating all his breakfast. Vitals signs stable. RS: clear, CVS: S1S2 heard and RRR, Abd: obese, NT, soft and normal BS's present, CNS: Alert and oriented, mostly expressive aphasia, no dysarthria, flattening of R NL fold. No pronator drift. Ext: grade 5/5 power. HD catheter right upper anterior chest.  MRI Brain confirms acute R brain stroke. I discussed with the Neuro Hospitalist who had seen him yesterday. He is to be seen by the stroke team today.  Nephrology has been informed by the admitter re HD needs, but I would ask the teaching service to confirm with them.  Since [facility] patients are admitted by the IM teaching service, I discussed with Dr. Joni Reining, IM teaching service attending who has kindly accepted further care of the patient on to the IM service. I have informed patient about this. TRH will sign off. Please call for any further assistance.  Vernell Leep, MD, FACP, FHM. [facility] Pager [phone]  If 7PM-7AM, please contact night-coverage [email or web address] Password [email or web address] 04/16/2017, 8:46 AM

## 2017-04-16 NOTE — Consult Note (Signed)
[facility] Renal Consultation Note    Indication for Consultation:  Management of ESRD/hemodialysis; anemia, hypertension/volume and secondary hyperparathyroidism   HPI: Blake Ferrara Sr. is a 66 y.o. male with ESRD secondary to HTN/DM. HD initiated 11/2016. PMH also includes CHF (Echo 02/2017 with EF 65-70%) OSA.  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  Admitted with acute R parietal CVA. He presented to [facility] ED yesterday from his dialysis center. EMS called for noted difficulty communicating, slurred speech after his dialysis treatment. Dysarthria and aphasia noted on arrival and stroke code called. Brain MRI showed subcentimeter acute/early subacute infarction within the right parietal subcortical white matter. Echo, carotid dopplers pending. Neurology following.  Labs showing Na 137 K 3.4 Glucose 148, Ca 8.7. WBC 8.4, Hgb 12.2.   Seen in room. Alert, communicative with aphasia. Disoriented to time and place. Remembers yesterday's events. Had trouble with word finding after dialysis yesterday. Feels better today, has complaints about being taken off machine late at dialysis center. Denies CP, SOB, abdominal pain, N,V,D.   Dialyzes at [facility] TTS. Last HD yesterday, completed full treatment, compliant with HD and usually meeting target weight.    Past Medical History:  Diagnosis Date  . Anemia   . CVA (cerebral vascular accident) [facility]) 12/2013   admitted in July 2015 for acute right-sided infarct/notes 12/02/2014- 01/28/17-  uses walker   . ESRD (end stage renal disease) ([place])    Hemo TTHSat  . Hypercholesterolemia   . Hypertension   . Multiple wounds    on lower legs - sees [name] Pain at the [place]  . OSA on CPAP    uses cpap, setting of 5, 01/28/17- does not use any longer  . 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 Stroke ([place]) 12/02/2014   expressive aphasia 01/28/17-  sometimes  . Systolic CHF ([place])   . Type II diabetes mellitus ([place])    uncontrolled/notes 12/02/2014   Past Surgical History:  Procedure Laterality Date  . A/V FISTULAGRAM N/A 01/17/2017   Procedure: A/V Fistulagram - Left arm;  Surgeon: Elam Dutch, MD;  Location: [place] [facility];  Service: Cardiovascular;  Laterality: N/A;  . AV FISTULA PLACEMENT Left 09/27/2015   Procedure: RADIOCEPHALIC ARTERIOVENOUS (AV) FISTULA CREATION LEFT ARM;  Surgeon: Mal Misty, MD;  Location: [place];  Service: Vascular;  Laterality: Left;  . AV FISTULA PLACEMENT Left 01/29/2017   Procedure: ARTERIOVENOUS (AV) FISTULA CREATION BRACHIOCEPHALIC;  Surgeon: Elam Dutch, MD;  Location: [facility] OR;  Service: Vascular;  Laterality: Left;  . AV FISTULA PLACEMENT Left 04/02/2017   Procedure: ARTERIOVENOUS (AV) FISTULA CREATION LEFT BRACHIOCEPHALIC AND LIGATION LEFT RADIOCEPHALIC;  Surgeon: Elam Dutch, MD;  Location: [place];  Service: Vascular;  Laterality: Left;  . COLONOSCOPY  6-7 yrs ago  . COLONOSCOPY N/A 04/05/2013   Procedure: COLONOSCOPY;  Surgeon: Juanita Craver, MD;  Location: [facility];  Service: Endoscopy;  Laterality: N/A;  . LIGATION OF ARTERIOVENOUS  FISTULA Left 01/29/2017   Procedure: LIGATION OF RADIOCEPHALIC ARTERIOVENOUS  FISTULA;  Surgeon: Elam Dutch, MD;  Location: [facility] OR;  Service: Vascular;  Laterality: Left;   Family History  Problem Relation Age of Onset  . Diabetes Mother   . Hypertension Mother   . Diabetes Father   . Hypertension Father   . Asthma Son   . Diabetes Sister   . Diabetes Brother  Social History:  reports that he quit smoking about 46 years ago. His smoking use included Cigarettes. He has a 0.50 pack-year smoking history. He has never used smokeless tobacco. He reports that he uses drugs, including Marijuana. He reports that he does not drink alcohol. No Known Allergies Prior to Admission medications   Medication Sig Start Date End Date Taking?  Authorizing Provider  acetaminophen (ACETAMINOPHEN 8 HOUR) 650 MG CR tablet Take 650 mg by mouth every 8 (eight) hours.   Yes [provider]  cinacalcet (SENSIPAR) 30 MG tablet Take 1 tablet (30 mg total) by mouth Every Tuesday,Thursday,and Saturday with dialysis. 02/25/17  Yes Debbe Odea, MD  clopidogrel (PLAVIX) 75 MG tablet TAKE 75 mg TABLET BY MOUTH EVERY DAY WITH BREAKFAST 02/22/17  Yes Debbe Odea, MD  escitalopram (LEXAPRO) 10 MG tablet Take 1 tablet by mouth daily in evening Patient taking differently: Take 10 mg by mouth at bedtime. Take 1 tablet by mouth daily in evening 11/05/16  Yes Shawnee Knapp, MD  lanthanum (FOSRENOL) 1000 MG chewable tablet Chew 1 tablet by mouth 3 (three) times daily.    Yes [provider]  multivitamin (RENA-VIT) TABS tablet Take 1 tablet by mouth daily.   Yes [provider]  senna-docusate (SENOKOT-S) 8.6-50 MG tablet Take 1 tablet by mouth at bedtime as needed for moderate constipation. Patient taking differently: Take 2 tablets by mouth at bedtime as needed for moderate constipation.  02/22/17  Yes Debbe Odea, MD  aspirin 325 MG tablet Take 1 tablet (325 mg total) by mouth every evening. 02/22/17   Debbe Odea, MD  atorvastatin (LIPITOR) 80 MG tablet Take 1 tablet (80 mg total) by mouth daily at 6 PM. Patient not taking: Reported on 03/21/2017 02/22/17   Debbe Odea, MD   Current Facility-Administered Medications  Medication Dose Route Frequency Provider Last Rate Last Dose  . acetaminophen (TYLENOL) tablet 650 mg  650 mg Oral Q8H Ivor Costa, MD   650 mg at 04/16/17 0602  . aspirin tablet 325 mg  325 mg Oral QPM Ivor Costa, MD   325 mg at 04/16/17 0050  . atorvastatin (LIPITOR) tablet 80 mg  80 mg Oral q1800 Ivor Costa, MD      . Derrill Memo ON 04/17/2017] cinacalcet (SENSIPAR) tablet 30 mg  30 mg Oral Q T,Th,Sa-HD Ivor Costa, MD      . clopidogrel (PLAVIX) tablet 75 mg  75 mg Oral Daily Ivor Costa, MD   75 mg at 04/16/17 1024  .  escitalopram (LEXAPRO) tablet 10 mg  10 mg Oral QHS Ivor Costa, MD   10 mg at 04/16/17 0050  . heparin injection 5,000 Units  5,000 Units Subcutaneous Q8H Ivor Costa, MD   5,000 Units at 04/16/17 0602  . hydrALAZINE (APRESOLINE) injection 5 mg  5 mg Intravenous Q2H PRN Ivor Costa, MD      . lactulose ([place]) 10 GM/15ML solution 20 g  20 g Oral BID Ivor Costa, MD   20 g at 04/16/17 1025  . lanthanum (FOSRENOL) chewable tablet 1,000 mg  1,000 mg Oral TID WC Ivor Costa, MD   1,000 mg at 04/16/17 1021  . LORazepam (ATIVAN) injection 1 mg  1 mg Intravenous Once Ivor Costa, MD      . multivitamin (RENA-VIT) tablet 1 tablet  1 tablet Oral Daily Ivor Costa, MD   1 tablet at 04/16/17 1024  . ondansetron (ZOFRAN) injection 4 mg  4 mg Intravenous Q8H PRN Ivor Costa, MD      .  potassium chloride SA (K-DUR,KLOR-CON) CR tablet 40 mEq  40 mEq Oral Once Melanee Spry, MD      . senna-docusate (Senokot-S) tablet 1 tablet  1 tablet Oral QHS PRN Ivor Costa, MD      . zolpidem (AMBIEN) tablet 5 mg  5 mg Oral QHS PRN Ivor Costa, MD   5 mg at 04/16/17 0050    ROS: As per HPI otherwise negative.  Physical Exam: Vitals:   04/16/17 0244 04/16/17 0444 04/16/17 0600 04/16/17 1000  BP: 134/78 122/80 (!) 120/99 (!) 149/89  Pulse: 79 83 82 78  Resp: 18 18 18 16   Temp: 98.3 F (36.8 C) 98.3 F (36.8 C) 98.3 F (36.8 C) 97.7 F (36.5 C)  TempSrc: Oral Oral Oral Oral  SpO2: 94% 96% 98% 98%  Weight:      Height:         General: WDWN NAD Head: NCAT sclera not icteric MMM Neck: Supple. No JVD No masses Lungs: Breathing unlabored. Lungs CTAB  Heart: RRR with S1 S2 Abdomen: soft NT + BS Lower extremities: trace LE edema bilat; no open wounds  Neuro: Oriented to person, expressive aphasia  Psych:  Responds to questions appropriately with a normal affect. Dialysis Access: R IJ TDC, L BC AVF maturing +bruit   Labs: Basic Metabolic Panel:  Recent Labs Lab 04/15/17 1757 04/15/17 1814 04/16/17 0716   NA 140 137 137  K 3.2* 3.0* 3.4*  CL 95* 95* 97*  CO2  --  30 28  GLUCOSE 120* 116* 114*  BUN 28* 24* 34*  CREATININE 4.10* 4.16* 5.42*  CALCIUM  --  8.6* 8.7*   Liver Function Tests:  Recent Labs Lab 04/15/17 1814  AST 21  ALT 21  ALKPHOS 96  BILITOT 0.5  PROT 7.6  ALBUMIN 3.5   No results for input(s): LIPASE, AMYLASE in the last 168 hours.  Recent Labs Lab 04/15/17 2248  AMMONIA 40*   CBC:  Recent Labs Lab 04/15/17 1757 04/15/17 1814  WBC  --  8.4  NEUTROABS  --  4.3  HGB 13.9 12.2*  HCT 41.0 37.0*  MCV  --  95.4  PLT  --  302   Cardiac Enzymes: No results for input(s): CKTOTAL, CKMB, CKMBINDEX, TROPONINI in the last 168 hours. CBG:  Recent Labs Lab 04/15/17 2312 04/16/17 0702  GLUCAP 112* 116*   Iron Studies: No results for input(s): IRON, TIBC, TRANSFERRIN, FERRITIN in the last 72 hours. Studies/Results: [name]  Result Date: 04/15/2017 CLINICAL DATA:  66 y/o  M; altered speech.  Evaluate for stroke. EXAM: MRI HEAD WITHOUT CONTRAST MRA HEAD WITHOUT CONTRAST TECHNIQUE: Multiplanar, multiecho pulse sequences of the brain and surrounding structures were obtained without intravenous contrast. Angiographic images of the head were obtained using MRA technique without contrast. COMPARISON:  02/20/2017 MRI of the head. FINDINGS: MRI HEAD FINDINGS Brain: Subcentimeter focus of reduced diffusion within right parietal subcortical white matter compatible with acute/early subacute infarction (series 4, image 36). Infarction demonstrates T2 FLAIR hyperintensity. No associated hemorrhage or mass effect. Patchy nonspecific foci of T2 FLAIR hyperintense signal abnormality in subcortical and periventricular white matter are compatible with moderate chronic microvascular ischemic changes for age. Moderate brain parenchymal volume loss. Several chronic lacunar infarcts are present within the left lentiform nucleus and thalamus. The left thalamus lacunar infarct  demonstrates hemosiderin staining. Additionally there is a punctate focus of chronic microhemorrhage within the right thalamus. Vascular: As below. Skull and upper cervical spine: Normal marrow  signal. Sinuses/Orbits: Negative. Other: None. MRA HEAD FINDINGS Internal carotid arteries:  Patent. Anterior cerebral arteries: Patent. Right A1 fenestration near A-comm junction. Middle cerebral arteries: Patent. Right proximal M1 moderate stenosis. Anterior communicating artery: Patent.  Fenestrated A-comm. Posterior communicating arteries: Right fetal PCA. No left posterior communicating artery identified, likely hypoplastic or absent. Posterior cerebral arteries:  Patent. Basilar artery:  Patent. Vertebral arteries:  Patent.  Right dominant. No evidence of high-grade stenosis, large vessel occlusion, or aneurysm unless noted above. IMPRESSION: 1. Subcentimeter acute/early subacute infarction within the right parietal subcortical white matter. No associated hemorrhage or mass effect. 2. Stable moderate chronic microvascular ischemic changes and moderate parenchymal volume loss of the brain. 3. Multiple chronic lacunar infarcts in left basal ganglia. 4. Patent circle of Willis. No high-grade stenosis, aneurysm, or large vessel occlusion. 5. Right proximal M1 moderate stenosis. These results will be called to the ordering clinician or representative by the Radiologist Assistant, and communication documented in the PACS or zVision Dashboard. Electronically Signed   By: Kristine Garbe M.D.   On: 04/15/2017 22:29   Mr Virgel Paling YY Contrast  Result Date: 04/15/2017 CLINICAL DATA:  66 y/o  M; altered speech.  Evaluate for stroke. EXAM: MRI HEAD WITHOUT CONTRAST MRA HEAD WITHOUT CONTRAST TECHNIQUE: Multiplanar, multiecho pulse sequences of the brain and surrounding structures were obtained without intravenous contrast. Angiographic images of the head were obtained using MRA technique without contrast. COMPARISON:   02/20/2017 MRI of the head. FINDINGS: MRI HEAD FINDINGS Brain: Subcentimeter focus of reduced diffusion within right parietal subcortical white matter compatible with acute/early subacute infarction (series 4, image 36). Infarction demonstrates T2 FLAIR hyperintensity. No associated hemorrhage or mass effect. Patchy nonspecific foci of T2 FLAIR hyperintense signal abnormality in subcortical and periventricular white matter are compatible with moderate chronic microvascular ischemic changes for age. Moderate brain parenchymal volume loss. Several chronic lacunar infarcts are present within the left lentiform nucleus and thalamus. The left thalamus lacunar infarct demonstrates hemosiderin staining. Additionally there is a punctate focus of chronic microhemorrhage within the right thalamus. Vascular: As below. Skull and upper cervical spine: Normal marrow signal. Sinuses/Orbits: Negative. Other: None. MRA HEAD FINDINGS Internal carotid arteries:  Patent. Anterior cerebral arteries: Patent. Right A1 fenestration near A-comm junction. Middle cerebral arteries: Patent. Right proximal M1 moderate stenosis. Anterior communicating artery: Patent.  Fenestrated A-comm. Posterior communicating arteries: Right fetal PCA. No left posterior communicating artery identified, likely hypoplastic or absent. Posterior cerebral arteries:  Patent. Basilar artery:  Patent. Vertebral arteries:  Patent.  Right dominant. No evidence of high-grade stenosis, large vessel occlusion, or aneurysm unless noted above. IMPRESSION: 1. Subcentimeter acute/early subacute infarction within the right parietal subcortical white matter. No associated hemorrhage or mass effect. 2. Stable moderate chronic microvascular ischemic changes and moderate parenchymal volume loss of the brain. 3. Multiple chronic lacunar infarcts in left basal ganglia. 4. Patent circle of Willis. No high-grade stenosis, aneurysm, or large vessel occlusion. 5. Right proximal M1  moderate stenosis. These results will be called to the ordering clinician or representative by the Radiologist Assistant, and communication documented in the PACS or zVision Dashboard. Electronically Signed   By: Kristine Garbe M.D.   On: 04/15/2017 22:29   Ct Head Code Stroke Wo Contrast  Result Date: 04/15/2017 CLINICAL DATA:  Code stroke.  Aphasia.  History of stroke. EXAM: CT HEAD WITHOUT CONTRAST TECHNIQUE: Contiguous axial images were obtained from the base of the skull through the vertex without intravenous contrast. COMPARISON:  CT head 02/27/2017.  MR head 02/20/2017. FINDINGS: Brain: No evidence for acute infarction, hemorrhage, mass lesion, hydrocephalus, or extra-axial fluid. Generalized atrophy. Chronic microvascular ischemic change. Vascular: Calcification of the cavernous internal carotid arteries and distal vertebral arteries consistent with cerebrovascular atherosclerotic disease. No signs of intracranial large vessel occlusion. Skull: Calvarium intact. Sinuses/Orbits: No sinus or mastoid disease. BILATERAL lenticular opacities consistent cataracts. Other: None. ASPECTS [facility] Stroke Program Early CT Score) - Ganglionic level infarction (caudate, lentiform nuclei, internal capsule, insula, M1-M3 cortex): 7 - Supraganglionic infarction (M4-M6 cortex): 3 Total score (0-10 with 10 being normal): 10 IMPRESSION: 1. Atrophy and small vessel disease. Cerebrovascular atherosclerotic change. No signs of acute infarction or intracranial large vessel occlusion. Stable appearance from priors. 2. ASPECTS is 10. These results were called by telephone at the time of interpretation on 04/15/2017 at 6:03 pm to Dr. Amie Portland , who verbally acknowledged these results. Electronically Signed   By: Staci Righter M.D.   On: 04/15/2017 18:03    Dialysis Orders:  [facility] TTS  4h 62min  138kg   2/2   Hep 10000    [facility] TDC / L BC AVF (placed 04/02/17) - No VDRA/No ESA  - Venofer 100mg  IV x 10 (until  11/1) - Sensipar 60mg  PO q HD  Assessment/Plan: 1. Acute CVA - h/o prior CVA, Brain MRI acute/subacute right parietal subcortical infarction. Stroke work-up per neurology   2.  ESRD -  TTS. No urgent HD needs today. Next HD Thursday Use 4K bath  3.  Hypertension/volume  - BP ok, No outpatient BP meds/ UF to EDW   4.  Anemia  - Hgb 12.2 No ESA needs  5.  Metabolic bone disease -  Cont Fosrenol binder  6.  Nutrition - Renal diet/vitamins/prostat for low albumin  7. DM- per primary  8. CHF - EF 25-85%, grade 1 diastolic dysfunction   Lynnda Child PA-C [place] Pager [phone] 04/16/2017, 11:33 AM   Pt seen, examined, agree w assess/plan as above with additions as indicated. ESRD pt with recent initiation of dialysis in past few months, here with aphasia and new CVA.  W/U is in progress.  Plan is for HD tomorrow.  Will follow.  Kelly Splinter MD [employer] pager [phone]    cell [phone] 04/16/2017, 2:18 PM

## 2017-04-16 NOTE — Progress Notes (Signed)
PT Cancellation Note  Patient Details Name: Blake Brar Sr. MRN: [id number] DOB: 1950-08-06   Cancelled Treatment:    Reason Eval/Treat Not Completed: Patient at procedure or test/unavailable Pt having ECHO done. PT will check back this afternoon as able.   Ericka Marcellus B. Migdalia Dk PT, DPT Acute Rehabilitation  [phone] Pager [phone]  [place] 04/16/2017, 1:59 PM

## 2017-04-16 NOTE — Progress Notes (Signed)
Internal Medicine Attending:   I saw and examined the patient. I reviewed the resident's note and I agree with the resident's findings and plan as documented in the resident's note. Still having some word finding difficulty, otherwise has no complaints. On exam: right nasolabial fold flattening, EOMI, normal heels to shin, strength intact in all extremities, heart RRR without murmur,, lungs CTAB, HD cath in left upper chest, + bruit in right anti cubital fossa.  A/P Acute CVA - stroke team to evaluate today with SLP, PT and OT - DAPT ASA 325 + plavix 75mg  daily - Continue lipitor 80mg  daily  ESRD - Nephrology consulted for HD management.

## 2017-04-16 NOTE — Progress Notes (Signed)
   Subjective:  Patient feels that his speech/and word finding abilities have improved since admission. States he was at dialysis for ~5 hours yesterday and after his session he felt confused and had difficulty with word finding. He says he has more difficulty speaking and remembering words when he "gets going" and talks more.  The patient states this is his fourth stroke. He denies focal weakness in any of his extremities. He uses a walker or wheelchair to assist with ambulation.   Objective:  Vital signs in last 24 hours: Vitals:   04/16/17 0044 04/16/17 0244 04/16/17 0444 04/16/17 0600  BP: (!) 154/75 134/78 122/80 (!) 120/99  Pulse: 73 79 83 82  Resp: 18 18 18 18   Temp: 98.3 F (36.8 C) 98.3 F (36.8 C) 98.3 F (36.8 C) 98.3 F (36.8 C)  TempSrc: Oral Oral Oral Oral  SpO2: 93% 94% 96% 98%  Weight:      Height:       General: Laying in bed comfortably, NAD HEENT: [place]/AT, EOMI, no scleral icterus, PERRL; mild loss of right nasolabial fold, minimal dysarthria  Cardiac: RRR, No R/M/G appreciated Pulm: normal effort, CTAB in anterior and lateral lung fields Abd: soft, non tender, non distended, BS normal Ext: extremities well perfused, no peripheral edema Neuro: alert and oriented X3, cranial nerves II-XII grossly intact; expressive aphasia, mild loss of right nasolabial fold, minimal dysarthria; upper and lower extremity strength 5/5 bilaterally; normal finger to nose and heel to shin   Assessment/Plan:  Principal Problem:   Stroke (cerebrum) (HCC) Active Problems:   OSA (obstructive sleep apnea)   Essential hypertension, benign   HLD (hyperlipidemia)   Type II diabetes mellitus with neurological manifestations (HCC)   Chronic diastolic heart failure, NYHA class 2 (HCC)   Expressive aphasia   Acute metabolic encephalopathy   Aphasia   ESRD on dialysis (TTS):   Hypokalemia   Acute CVA  Expressive aphasia improved since admission. Mild loss of right nasolabial fold,  minimal dysarthria; with extremity strength 5/5 bilaterally. MRI brain with subcentimeter acute/early subacute infarction within the right parietal subcortical white matter. No associated hemorrhage or mass effect. MRA with moderate stenosis of right proximal M1. Hemoglobin A1c 6.8, LDL 101. Patient recently admitted for acute CVA in 02/2017; MRI at that time with multiple nonhemorrhagic infarcts affecting the left cerebrum, thalamus, and cerebellum likely embolic. ECHO at that time did not reveal a cardiac source of emboli. Previously started on high dose aspirin and Plavix, and high intensity statin. Getting stroke work up.  -Neurology on board -- appreciate recommendations  -ECHO -Carotid dopplers -PT/OT/Speech -ASA 325 mg daily, Plavix 75 mg daily -Lipitor 80 mg daily  -Cardiac monitoring - reviewed and is sinus rhythm  ESRD Hemodialysis TTS. Had HD yesterday. Potassium is 3.4, bicarbonate 28, BUN 34. -Nephrology on board --appreciate recommendations -Continue Fosrenol, [place]  Type 2 diabetes mellitus CBG this AM 114. A1C 6.8. Currently diet controlled and not on medications at home. -CBG qAM  Chronic diastolic heart failure Most recent ECHO 02/2017 with EF 65-70% and grade 1 diastolic dysfunction. Patient is euvolemic on examination, CHF is compensated.  -continue to monitor volume status  Depression -Continue Lexapro 10 mg QHS   Dispo: Anticipated discharge in approximately 1 day.  Melanee Spry, MD 04/16/2017, 9:04 AM Pager: [phone]

## 2017-04-16 NOTE — Progress Notes (Signed)
STROKE TEAM PROGRESS NOTE   SUBJECTIVE (INTERVAL HISTORY) Patient is found sitting in bedside chair. No family at bedside.   Overall he feels his condition is gradually improving.  Voices no new complaints. No new events reported overnight.  States his symptoms occurred "half way through dialysis". He noticed some word finding and speech difficulties. States he had to stay 45 minutes longer to get more fluid off.   On exam this morning he states he feels his cognition is not at baseline and continues to have word finding difficulties. This has been documented as a baseline finding in the medical record.  OBJECTIVE  Recent Labs Lab 04/15/17 2312 04/16/17 0702 04/16/17 1156  GLUCAP 112* 116* 113*    Recent Labs Lab 04/15/17 1757 04/15/17 1814 04/16/17 0716  NA 140 137 137  K 3.2* 3.0* 3.4*  CL 95* 95* 97*  CO2  --  30 28  GLUCOSE 120* 116* 114*  BUN 28* 24* 34*  CREATININE 4.10* 4.16* 5.42*  CALCIUM  --  8.6* 8.7*  MG  --   --  2.0    Recent Labs Lab 04/15/17 1814  AST 21  ALT 21  ALKPHOS 96  BILITOT 0.5  PROT 7.6  ALBUMIN 3.5    Recent Labs Lab 04/15/17 1757 04/15/17 1814  WBC  --  8.4  NEUTROABS  --  4.3  HGB 13.9 12.2*  HCT 41.0 37.0*  MCV  --  95.4  PLT  --  302   No results for input(s): CKTOTAL, CKMB, CKMBINDEX, TROPONINI in the last 168 hours.  Recent Labs  04/15/17 1814  LABPROT 13.6  INR 1.05   No results for input(s): COLORURINE, LABSPEC, PHURINE, GLUCOSEU, HGBUR, BILIRUBINUR, KETONESUR, PROTEINUR, UROBILINOGEN, NITRITE, LEUKOCYTESUR in the last 72 hours.  Invalid input(s): APPERANCEUR     Component Value Date/Time   CHOL 175 04/16/2017 0716   TRIG 138 04/16/2017 0716   HDL 46 04/16/2017 0716   CHOLHDL 3.8 04/16/2017 0716   VLDL 28 04/16/2017 0716   LDLCALC 101 (H) 04/16/2017 0716   Lab Results  Component Value Date   HGBA1C 6.8 (H) 04/16/2017      Component Value Date/Time   LABOPIA NONE DETECTED 05/29/2015 2130    COCAINSCRNUR NONE DETECTED 05/29/2015 2130   LABBENZ NONE DETECTED 05/29/2015 2130   AMPHETMU NONE DETECTED 05/29/2015 2130   THCU NONE DETECTED 05/29/2015 2130   LABBARB NONE DETECTED 05/29/2015 2130     Recent Labs Lab 04/15/17 1814  ETH <10    IMAGING: I have personally reviewed the radiological images below and agree with the radiology interpretations.  [name] Result Date: 04/15/2017 IMPRESSION: 1. Subcentimeter acute/early subacute infarction within the right parietal subcortical white matter. No associated hemorrhage or mass effect. 2. Stable moderate chronic microvascular ischemic changes and moderate parenchymal volume loss of the brain. 3. Multiple chronic lacunar infarcts in left basal ganglia. 4. Patent circle of Willis. No high-grade stenosis, aneurysm, or large vessel occlusion. 5. Right proximal M1 moderate stenosis.   Mr Jodene Nam Head Wo Contrast Result Date: 04/15/2017 IMPRESSION: 1. Subcentimeter acute/early subacute infarction within the right parietal subcortical white matter. No associated hemorrhage or mass effect. 2. Stable moderate chronic microvascular ischemic changes and moderate parenchymal volume loss of the brain. 3. Multiple chronic lacunar infarcts in left basal ganglia. 4. Patent circle of Willis. No high-grade stenosis, aneurysm, or large vessel occlusion. 5. Right proximal M1 moderate stenosis.  Ct Head Code Stroke Wo Contrast Result Date: 04/15/2017 IMPRESSION:  1. Atrophy and small vessel disease. Cerebrovascular atherosclerotic change. No signs of acute infarction or intracranial large vessel occlusion. Stable appearance from priors. 2. ASPECTS is 10. T   2D Echo:                            PENDING  B/L Carotid Doppler:         PENDING  PHYSICAL EXAM Temp:  [97.7 F (36.5 C)-98.3 F (36.8 C)] 97.7 F (36.5 C) (10/31 1000) Pulse Rate:  [70-83] 78 (10/31 1000) Resp:  [16-20] 16 (10/31 1000) BP: (120-158)/(70-99) 149/89 (10/31  1000) SpO2:  [92 %-98 %] 98 % (10/31 1000) Weight:  [137.4 kg (303 lb)-142.3 kg (313 lb 11.4 oz)] 137.4 kg (303 lb) (10/30 2244)  General -obese middle-aged African-American male, in no apparent distress Respiratory - Unlabored breathing on exam Cardiovascular - Regular rate and rhythm  NEURO:  Mental Status: AA&ox3 Language: speech is mildly dysarthric with slow and hesitant spech.  Naming is mildly impaired.  Repetition is intact.  Fluency and comprehension is intact.  Attention is intact. Cranial Nerves: PERRL 32mm/brisk. EOMI, visual fields full, no facial asymmetry, facial sensation intact, hearing intact, tongue/uvula/soft palate midline, normal sternocleidomastoid and trapezius muscle strength. No evidence of tongue atrophy or fibrillations Motor: Symmetric 4+/5 bilateral lower extremities.  Symmetric nearly 5/5 bilateral upper extremities.  No vertical drift in upper extremities.  Tone: is normal and bulk is normal Sensation- Intact to light touch bilaterally Coordination: FTN intact bilaterally, unable to perform heel-knee-shin Gait- deferred  ASSESSMENT AND PLAN: [name] is a 66 y.o. male with PMH of prior CVA HTN, HLD, DM2, CHF,  ESRD on dialysis  admitted for mildly dysarthric and aphasic speech.  Acute Right parietal subcortical white matter Infarction: Secondary to chronic small vessel disease and hypotension in the setting of   dialysis   Resultant:   Word finding difficulties   MRI / MRA:   Acute Right parietal subcortical white matter Infarction.   Multiple chronic lacunar infarcts in left basal ganglia.  Right proximal M1 moderate stenosis.                     CT Head: No acute findings   2D Echo:                             PENDING  B/L Carotid Doppler:         PENDING     VTE prophylaxis:  Heparin   Diet heart healthy/carb modified Room service appropriate? Yes; Fluid consistency: Thin    aspirin 325 mg daily and Plavix 75 mg prior  to admission, now on clopidogrel 75 mg daily.   Please discharge patient on Plavix 75 mg daily only  Patient counseled to be compliant with his antithrombotic medications        Therapy recommendations:  NONE       Disposition: HOME          Recommendations:   Follow ECHO and Carotid doppler results  Avoid Hypotensive episodes in Dialysis.  Ongoing aggressive stroke risk factor management        Follow up Appointments:    PCP follow up   Follow up with [facility], Dr Leonie Man in 6 weeks  Intracranial Stenosis: Right proximal M1 moderate stenosis.  Pt was previously on DAPT for greater than 3 months, have discontinued ASA  on this admission and patient will be discharged home on Plavix alone  Diabetes:  HgbA1c 6.8    goal < 7.0  Controlled  CBG monitoring and SSI  DM education and close PCP follow up  Hypertension: Stable Permissive hypertension (OK if <220/120) for 24-48 hours post stroke and then gradually normalized within 5-7 days.  Long term BP goal normotensive. May slowly restart home B/P medications after 48 hours with close PCP follow up.  Hyperlipidemia:  Home meds:  Lipitor 80 mg  LDL  101   , goal < 70  Now on Lipitor to 80 mg daily  Continue statin at discharge  Other Stroke Risk Factors:  Advanced age  Obesity, Body mass index is 43.48 kg/m.   Hx stroke/TIA  Obstructive sleep apnea, on CPAP at home  CHF  Other Active Problems:  CKD III  Hospital day # 1   Renie Ora Stroke Neurology Team 04/16/2017 1:36 PM I have personally examined this patient, reviewed notes, independently viewed imaging studies, participated in medical decision making and plan of care.ROS completed by me personally and pertinent positives fully documented  I have made any additions or clarifications directly to the above note. Agree with note above. The patient developed transient speech difficulties in the setting of hypotension  and prolonged dialysis and MRI scan shows a tiny right parietal white matter infarct which is likely from small vessel disease. Recommend avoid hypotension and dehydration during dialysis in the future and continuing aspirin and aggressive risk factor modification. Greater than 50% time during this 25 minute visit was spent on counseling and coordination of care about his lacunar infarct and discussion about prevention and treatment options. Follow-up as an outpatient in stroke clinic in 6 weeks. Stroke team will sign off. Kindly call for questions.  Antony Contras, MD Medical Director [facility] Pager: [phone] 04/16/2017 3:50 PM  Neurology to sign-off at this time. Please call with any further questions or concerns. Thank you for this consultation.  To contact Stroke Continuity provider, please refer to [email or web address] After hours, contact General Neurology

## 2017-04-17 ENCOUNTER — Inpatient Hospital Stay (HOSPITAL_COMMUNITY): Payer: Medicare (Managed Care)

## 2017-04-17 DIAGNOSIS — E878 Other disorders of electrolyte and fluid balance, not elsewhere classified: Secondary | ICD-10-CM

## 2017-04-17 DIAGNOSIS — G4733 Obstructive sleep apnea (adult) (pediatric): Secondary | ICD-10-CM

## 2017-04-17 DIAGNOSIS — I6789 Other cerebrovascular disease: Secondary | ICD-10-CM

## 2017-04-17 DIAGNOSIS — I120 Hypertensive chronic kidney disease with stage 5 chronic kidney disease or end stage renal disease: Secondary | ICD-10-CM

## 2017-04-17 LAB — RENAL FUNCTION PANEL
Albumin: 3.1 g/dL — ABNORMAL LOW (ref 3.5–5.0)
Anion gap: 13 (ref 5–15)
BUN: 52 mg/dL — AB (ref 6–20)
CHLORIDE: 97 mmol/L — AB (ref 101–111)
CO2: 27 mmol/L (ref 22–32)
CREATININE: 7.33 mg/dL — AB (ref 0.61–1.24)
Calcium: 9.1 mg/dL (ref 8.9–10.3)
GFR calc Af Amer: 8 mL/min — ABNORMAL LOW (ref >60)
GFR, EST NON AFRICAN AMERICAN: 7 mL/min — AB (ref >60)
GLUCOSE: 134 mg/dL — AB (ref 65–99)
POTASSIUM: 3.6 mmol/L (ref 3.5–5.1)
Phosphorus: 4.9 mg/dL — ABNORMAL HIGH (ref 2.5–4.6)
Sodium: 137 mmol/L (ref 135–145)

## 2017-04-17 LAB — CBC
HCT: 36.1 % — ABNORMAL LOW (ref 39.0–52.0)
HEMOGLOBIN: 11.7 g/dL — AB (ref 13.0–17.0)
MCH: 31.7 pg (ref 26.0–34.0)
MCHC: 32.4 g/dL (ref 30.0–36.0)
MCV: 97.8 fL (ref 78.0–100.0)
PLATELETS: 324 10*3/uL (ref 150–400)
RBC: 3.69 MIL/uL — ABNORMAL LOW (ref 4.22–5.81)
RDW: 19.1 % — ABNORMAL HIGH (ref 11.5–15.5)
WBC: 11.6 10*3/uL — ABNORMAL HIGH (ref 4.0–10.5)

## 2017-04-17 LAB — GLUCOSE, CAPILLARY: GLUCOSE-CAPILLARY: 125 mg/dL — AB (ref 65–99)

## 2017-04-17 MED ORDER — ALTEPLASE 2 MG IJ SOLR: 2.0000 mg | Freq: Once | INTRAMUSCULAR | Status: DC | PRN

## 2017-04-17 MED ORDER — SODIUM CHLORIDE 0.9 % IV SOLN: 100.0000 mL | INTRAVENOUS | Status: DC | PRN

## 2017-04-17 MED ORDER — HEPARIN SODIUM (PORCINE) 1000 UNIT/ML DIALYSIS: 1000.0000 [IU] | INTRAMUSCULAR | Status: DC | PRN

## 2017-04-17 MED ORDER — CLOPIDOGREL BISULFATE 75 MG PO TABS: 75.0000 mg | ORAL_TABLET | Freq: Every day | ORAL | 1 refills | Status: DC

## 2017-04-17 MED ORDER — ASPIRIN 325 MG PO TABS: 325.0000 mg | ORAL_TABLET | Freq: Every day | ORAL | Status: DC

## 2017-04-17 MED ORDER — HEPARIN SODIUM (PORCINE) 1000 UNIT/ML DIALYSIS
5000.0000 [IU] | Freq: Once | INTRAMUSCULAR | Status: DC
  Filled 2017-04-17: qty 5

## 2017-04-17 MED ORDER — LIDOCAINE-PRILOCAINE 2.5-2.5 % EX CREA: 1.0000 "application " | TOPICAL_CREAM | CUTANEOUS | Status: DC | PRN

## 2017-04-17 MED ORDER — LIDOCAINE HCL (PF) 1 % IJ SOLN: 5.0000 mL | INTRAMUSCULAR | Status: DC | PRN

## 2017-04-17 MED ORDER — PENTAFLUOROPROP-TETRAFLUOROETH EX AERO: 1.0000 "application " | INHALATION_SPRAY | CUTANEOUS | Status: DC | PRN

## 2017-04-17 NOTE — Progress Notes (Signed)
Hemodialysis. UF has been off for over 30 minutes due to hypotension. Per Dr. Jonnie Finner keep uf off and give saline until bp >695 systolic. Continue to monitor patient.

## 2017-04-17 NOTE — Discharge Summary (Signed)
Name: Blake Malcomb Sr. MRN: [id number] DOB: 1950/10/04 66 y.o. PCP: Shawnee Knapp, MD  Date of Admission: 04/15/2017  5:41 PM Date of Discharge: 04/18/2017 Attending Physician: Aldine Contes, MD  Discharge Diagnosis: Principal Problem:   Stroke (cerebrum) ([place])  Active Problems:   OSA (obstructive sleep apnea)   Essential hypertension, benign   HLD (hyperlipidemia)   Type II diabetes mellitus with neurological manifestations (HCC)   Chronic diastolic heart failure, NYHA class 2 (HCC)   Expressive aphasia   Acute metabolic encephalopathy   Aphasia   ESRD on dialysis (TTS):   Hypokalemia   Discharge Medications: Allergies as of 04/17/2017   No Known Allergies     Medication List    STOP taking these medications   aspirin 325 MG tablet     TAKE these medications   ACETAMINOPHEN 8 HOUR 650 MG CR tablet Generic drug:  acetaminophen Take 650 mg by mouth every 8 (eight) hours.   atorvastatin 80 MG tablet Commonly known as:  LIPITOR Take 1 tablet (80 mg total) by mouth daily at 6 PM.   cinacalcet 30 MG tablet Commonly known as:  SENSIPAR Take 1 tablet (30 mg total) by mouth Every Tuesday,Thursday,and Saturday with dialysis.   clopidogrel 75 MG tablet Commonly known as:  PLAVIX TAKE 75 mg TABLET BY MOUTH EVERY DAY WITH BREAKFAST What changed:  Another medication with the same name was added. Make sure you understand how and when to take each.   clopidogrel 75 MG tablet Commonly known as:  PLAVIX Take 1 tablet (75 mg total) by mouth daily. What changed:  You were already taking a medication with the same name, and this prescription was added. Make sure you understand how and when to take each.   escitalopram 10 MG tablet Commonly known as:  LEXAPRO Take 1 tablet by mouth daily in evening What changed:  how much to take  how to take this  when to take this  additional instructions   FOSRENOL 1000 MG chewable tablet Generic drug:  lanthanum Chew 1  tablet by mouth 3 (three) times daily.   multivitamin Tabs tablet Take 1 tablet by mouth daily.   senna-docusate 8.6-50 MG tablet Commonly known as:  Senokot-S Take 1 tablet by mouth at bedtime as needed for moderate constipation. What changed:  how much to take       Disposition and follow-up:   Blake Antony Sr. was discharged from [facility] in Good condition.  At the hospital follow up visit please address:  1.  Problem based management   Cerebrovascular accident -Neurological exam to note any new deficits.  On day of discharge the patient did not have any upper or lower extremity deficits in strength or sensation.  The patient did not have any facial droop.  2.  Labs / imaging needed at time of follow-up: BMP to follow creatinine and electrolytes, follow-up on patient's blood glucose control, blood pressure check to note whether patient should be on antihypertensive agent  3.  Pending labs/ test needing follow-up: None  Follow-up Appointments: Follow-up Information    Garvin Fila, MD. Schedule an appointment as soon as possible for a visit in 6 week(s).   Specialties:  Neurology, Radiology Contact information: [address] [place] [address] [place] [facility] Course by problem list: Principal Problem:   Stroke (cerebrum) ([place]) Active Problems:   OSA (obstructive sleep apnea)   Essential hypertension,  benign   HLD (hyperlipidemia)   Type II diabetes mellitus with neurological manifestations (HCC)   Chronic diastolic heart failure, NYHA class 2 (HCC)   Expressive aphasia   Acute metabolic encephalopathy   Aphasia   ESRD on dialysis (TTS):   Hypokalemia   Cerebrovascular accident The patient presented with sudden onset confusion, aphasia on 04/15/17 during his dialysis.  He did not have any numbness, tingling in extremities, facial droop, vision change, or hearing loss.  He did not have any nausea,  vomiting, diarrhea,fever, or chills.  CT of the head was negative for any intracranial abnormalities.  However, MRI of the brain showed acute infarction in the right parietal subcortical white matter, microvascular ischemic changes, moderate parenchymal volume loss, lacunar infarcts in the left basal ganglia, right proximal M1 moderate stenosis.  There is also stenosis noted in both right and left carotids 1-39%.  There was no irregular rhythms noted per cardiac monitoring.  Echocardiogram showed preserved ejection fraction, grade 1 diastolic dysfunction, mild to moderately calcified mitral annulus, mild pattern of LVH no aortic stenosis.  Neurology was consulted for stroke workup.  Patient's deficits were thought to be due to small vessel disease.  Neurology also thought that there might be a component of dialysis disequilibrium syndrome. . Patient was placed on Lipitor 80 mg daily to maintain an LDL goal of less than 70 and he was also placed on Plavix 75 mg daily.  ESRD on dialysis The patient is on a TTS dialysis schedule.  The patient received dialysis during his hospitalization.  He also continued Fosrenol and Conacelet.  Patient's creatinine on day of discharge was 5.6  Essential hypertension The patient was admitted with a blood pressure of 145/83.  Hydralazine was available as needed if blood pressure exceeded 220.  Diabetes mellitus type 2 The patient's hemoglobin A1c was 6.8.  We monitored the patient's blood glucose every morning.   Discharge Vitals:   BP 123/87 (BP Location: Right Arm)   Pulse 93   Temp 98 F (36.7 C) (Oral)   Resp 20   Ht 5\' 10"  (1.778 m)   Wt (!) 304 lb 7.3 oz (138.1 kg)   SpO2 97%   BMI 43.68 kg/m   Pertinent Labs, Studies, and Procedures:   CT head (04/15/17)  negative for acute intracranial abnormalities, atrophy and small vessel disease, cerebrovascular atherosclerotic change  -MR brain (04/15/17) showed smallacute/early subacute infarction within the  right parietal subcortical white matter, stable moderate chronic microvascular ischemic changes and moderate parenchymal volume loss of brain, lacunar infarcts in left basal ganglia, right proximal M1 moderate stenosis  -Vas US Carotid Duplex (04/16/17) showed right ICA 1-39% stenosis. Left carotid showed 1-39% stenosis , right subclavian artery was not visualized, and vertebrals were not well visualized.   -Echo (04/17/17): Normal LV size with mild LV hypertrophy. EF 60-65%. Normal RV size and systolic function. No significant valvular abnormalities.  -Lipid panel: tcholes=175, hdl=46, ldl=101  -HgBA1c=6.8  -PT made recommendation that patient is safe to be discharged with [facility] program   Discharge Instructions: Discharge Instructions    Ambulatory referral to Neurology    Complete by:  As directed    An appointment is requested in approximately: 6 weeks Follow up with stroke clinic (Dr Leonie Man preferred, if not available, then consider Cecille Rubin, Dr Erlinda Hong, [facility] or Jaynee Eagles whoever is available) at [facility] in about 6-8 weeks. Thanks.   Diet - low sodium heart healthy    Complete by:  As directed  Increase activity slowly    Complete by:  As directed       Signed: [name], MD Internal Medicine PGY1 [id number]:[phone] 04/20/2017, 10:08 AM

## 2017-04-17 NOTE — Progress Notes (Signed)
HD initiated without issue. Patient currently resting quietly watching tv. Labs sent as ordered.

## 2017-04-17 NOTE — Care Management Note (Signed)
Case Management Note  Patient Details  Name: Blake Borum Sr. MRN: [id number] Date of Birth: Mar 20, 1951  Subjective/Objective:    Pt admitted with CVA. He is from home with his spouse and is active with [facility].                Action/Plan: PT/OT recommending to return to [facility] program. CM following for d/c needs, physician orders.   Expected Discharge Date:                  Expected Discharge Plan:  Home/Self Care (active with [facility])  In-House Referral:     Discharge planning Services     Post Acute Care Choice:    Choice offered to:     DME Arranged:    DME Agency:     HH Arranged:    HH Agency:     Status of Service:  In process, will continue to follow  If discussed at Long Length of Stay Meetings, dates discussed:    Additional Comments:  Pollie Friar, RN 04/17/2017, 1:19 PM

## 2017-04-17 NOTE — Progress Notes (Signed)
   Subjective: The patient was laying in his bed receiving hemodialysis when seen by IMTS. The patient states that he is doing better in comparison to yesterday. He denies any sob, dizziness, or chest pain. He seems to have poor understanding of what caused his hospitalization. He stated that he thinks he had a heart attack but was informed that it was a stroke.  Objective:  Vital signs in last 24 hours: Vitals:   04/16/17 1446 04/16/17 1815 04/16/17 2140 04/17/17 0528  BP: 134/70 109/83 136/80 (!) 128/54  Pulse: 88  95 80  Resp: 18  18 18   Temp: 98.9 F (37.2 C) 98.6 F (37 C) 98.5 F (36.9 C) 98.2 F (36.8 C)  TempSrc: Oral Oral Oral Oral  SpO2: 98% 94% 94% 96%  Weight:      Height:       Physical Exam  Constitutional: He is oriented to person, place, and time. He appears well-developed and well-nourished.  HENT:  Head: Normocephalic and atraumatic.  Eyes: Conjunctivae are normal.  Cardiovascular: Normal rate, regular rhythm and normal heart sounds.   Pulmonary/Chest: Effort normal and breath sounds normal. No respiratory distress. He has no wheezes.  Abdominal: Soft. Bowel sounds are normal. He exhibits no distension. There is no tenderness.  Musculoskeletal: Normal range of motion.  5/5 bilateral upper and lower extremity strength.  Neurological: He is alert and oriented to person, place, and time. No cranial nerve deficit.  Psychiatric: He has a normal mood and affect. His behavior is normal. Judgment and thought content normal.   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 The patient's imaging is consistent with small vessel disease as a cause of his stroke. The patient was also thought to have a component of dialysis disequilibrium syndrome causing his neurological deficits.  Workup- -CT head  negative for acute intracranial abnormalities, atrophy and small vessel disease, cerebrovascular atherosclerotic change -MR brain showed small acute/early subacute infarction within the right parietal subcortical white matter, stable moderate chronic microvascular ischemic changes and moderate parenchymal volume loss of brain, lacunar infarcts in left basal ganglia, right proximal M1 moderate stenosis -Vas US Carotid Duplex showed right ICA 1-39% stenosis. Left carotid showed 1-39% stenosis , right subclavian artery was not visualized, and vertebrals were not well visualized.  -Echo pending -Lipid panel: tcholes=175, hdl=46, ldl=101 -Telemetry did not show any atrial fibrillation -HgBA1c=6.8 -PT made recommendation that patient is safe to be discharged with [facility] program  Plan: -Lipitor 80mg  qd, LDL goal <70 -The patient was placed on Plavix 75mg  qd  ESRD The patient received hd today. Patient is at dry weight, no  Hemodialsys schedule TTS -nephrology following  -continue fosrenol, conacelet -BMP for AM 04/18/17  OSA -CPAP at night   Essential Hypertension The patient's blood pressure during the past 24 hrs has ranged 109-149/83-89 -The patient is now out of permissive htn window so his bp should be normalizing. The patient also received hd today.   Diabetes Mellitus Type 2 -HgBA1c=6.8, outpatient follow-up recommended -CBG monitoring inpatient   F: none E: none N: renal diet with 1298mL fluid restriction DVT: heparin   Dispo: Anticipated discharge in approximately 1 day.   [name], MD Internal Medicine PGY1 [phone] 04/17/2017, 6:49 AM

## 2017-04-17 NOTE — Progress Notes (Signed)
PT Cancellation Note  Patient Details Name: Blake Bell. MRN: [id number] DOB: September 25, 1950   Cancelled Treatment:    Reason Eval/Treat Not Completed: Patient at procedure or test/unavailable Pt is at HD.  PT to check back later today or tomorrow as time allows.   Thanks,   Barbarann Ehlers. Quintez Maselli, PT, DPT [phone]   04/17/2017, 9:42 AM

## 2017-04-17 NOTE — Progress Notes (Signed)
  Echocardiogram 2D Echocardiogram has been performed.  Blake Bell 04/17/2017, 4:03 PM

## 2017-04-17 NOTE — Progress Notes (Signed)
PT Cancellation Note  Patient Details Name: Blake Stephanie Sr. MRN: [id number] DOB: 12/10/1950   Cancelled Treatment:    Reason Eval/Treat Not Completed: Patient at procedure or test/unavailable.  Pt is still out of room.  PT will attempt back later today or tomorrow if still here.  Spoke with MD who sates he may d/c later today.  Based on yesterday's evaluation, recommending he return home with [facility] program in place.   Thanks,    Barbarann Ehlers. Darthula Desa, PT, DPT [phone]   04/17/2017, 2:48 PM

## 2017-04-17 NOTE — Evaluation (Signed)
Speech Language Pathology Evaluation Patient Details Name: Blake Worthington Sr. MRN: [id number] DOB: 05-12-1951 Today's Date: 04/17/2017 Time: 6295-2841 SLP Time Calculation (min) (ACUTE ONLY): 21 min  Problem List:  Patient Active Problem List   Diagnosis Date Noted  . Stroke (cerebrum) ([place]) 04/15/2017  . Acute metabolic encephalopathy 32/44/0102  . Aphasia 04/15/2017  . ESRD on dialysis (TTS): 04/15/2017  . Hypokalemia 04/15/2017  . Word finding difficulty   . Pressure injury of skin 02/22/2017  . TIA (transient ischemic attack) 02/21/2017  . Expressive aphasia 09/06/2016  . Weakness following cerebrovascular accident (CVA) 09/06/2016  . Poor tolerance for ambulation 09/06/2016  . Chronic kidney disease (CKD), stage IV (severe) ([place]) 09/12/2015  . Chronic diastolic heart failure, NYHA class 2 ([place]) 08/08/2015  . Anemia, iron deficiency 06/04/2015  . Acute on chronic systolic (congestive) heart failure ([place]) 05/29/2015  . Systolic CHF ([place]) 72/53/6644  . Uncontrolled diabetes with stage 4 chronic kidney disease GFR 15-29 ([place]) 12/28/2014  . Type II diabetes mellitus with neurological manifestations ([place]) 12/12/2014  . Acute ischemic left MCA stroke ([place]) 12/02/2014  . Type 2 diabetes mellitus with complication ([place]) 03/47/4259  . Essential hypertension, benign 03/24/2014  . HLD (hyperlipidemia) 03/24/2014  . CVA (cerebral infarction) 01/13/2014  . Hypernatremia 07/03/2013  . Morbid obesity ([place]) 07/03/2013  . Venous stasis ulcers ([place]) 07/03/2013  . DM (diabetes mellitus) type 2, uncontrolled, with ketoacidosis ([place]) 07/03/2013  . 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 Sellar or suprasellar mass 06/22/2013  . Acute respiratory failure ([place]) 06/22/2013  . Seizure ([place]) 06/21/2013  . Altered mental status 06/21/2013  . Hypercarbia 06/21/2013  . OSA (obstructive sleep apnea) 05/25/2013   Past Medical History:  Past Medical History:  Diagnosis Date  . Anemia   . CVA  (cerebral vascular accident) [facility]) 12/2013   admitted in July 2015 for acute right-sided infarct/notes 12/02/2014- 01/28/17-  uses walker   . ESRD (end stage renal disease) ([place])    Hemo TTHSat  . Hypercholesterolemia   . Hypertension   . Multiple wounds    on lower legs - sees [name] Pain at the [facility]  . OSA on CPAP    uses cpap, setting of 5, 01/28/17- does not use any longer  . 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 Stroke ([place]) 12/02/2014   expressive aphasia 01/28/17- sometimes  . Systolic CHF ([place])   . Type II diabetes mellitus ([place])    uncontrolled/notes 12/02/2014   Past Surgical History:  Past Surgical History:  Procedure Laterality Date  . A/V FISTULAGRAM N/A 01/17/2017   Procedure: A/V Fistulagram - Left arm;  Surgeon: Elam Dutch, MD;  Location: [facility];  Service: Cardiovascular;  Laterality: N/A;  . AV FISTULA PLACEMENT Left 09/27/2015   Procedure: RADIOCEPHALIC ARTERIOVENOUS (AV) FISTULA CREATION LEFT ARM;  Surgeon: Mal Misty, MD;  Location: [facility];  Service: Vascular;  Laterality: Left;  . AV FISTULA PLACEMENT Left 01/29/2017   Procedure: ARTERIOVENOUS (AV) FISTULA CREATION BRACHIOCEPHALIC;  Surgeon: Elam Dutch, MD;  Location: [facility];  Service: Vascular;  Laterality: Left;  . AV FISTULA PLACEMENT Left 04/02/2017   Procedure: ARTERIOVENOUS (AV) FISTULA CREATION LEFT BRACHIOCEPHALIC AND LIGATION LEFT RADIOCEPHALIC;  Surgeon: Elam Dutch, MD;  Location: [facility];  Service: Vascular;  Laterality: Left;  . COLONOSCOPY  6-7 yrs ago  . COLONOSCOPY N/A 04/05/2013   Procedure: COLONOSCOPY;  Surgeon: Juanita Craver, MD;  Location: [facility];  Service: Endoscopy;  Laterality: N/A;  .  LIGATION OF ARTERIOVENOUS  FISTULA Left 01/29/2017   Procedure: LIGATION OF RADIOCEPHALIC ARTERIOVENOUS  FISTULA;  Surgeon: Elam Dutch, MD;  Location: [facility];  Service: Vascular;  Laterality: Left;   HPI:  66 y.o. male with medical history  significant of hypertension, hyperlipidemia, diet controlled diabetes mellitus, stroke, depression, CHF, OSA on CPAP, ESRD-HD, anemia, who presents with aphasia and confusion. MRI subcentimeter acute/early subacute infarction within the right   Assessment / Plan / Recommendation Clinical Impression  Pt familiar to this SLP from speech-language-cognitive assessment performed 02/2017. Pt admitted with difficulty speaking at dialysis. Overall his cognitive and communicative abilites appear at baseline (hx of aphasia and dysfluency) with improvements noted in confrontational naming. Dysfluency significantly less than previous evaluation. Speech is intelligible and suspect difficulty with higher level/more complex executive functioning. Pt lives with his wife and reports she handles the finances. He reports "not being able to write well" before his strokes. No follow up ST needed at this time. If pt notices increased difficulties, please reconsult.    SLP Assessment  SLP Recommendation/Assessment: Patient does not need any further Speech Lanaguage Pathology Services SLP Visit Diagnosis: Cognitive communication deficit (R41.841)    Follow Up Recommendations  None    Frequency and Duration           SLP Evaluation Cognition  Overall Cognitive Status: History of cognitive impairments - at baseline Arousal/Alertness: Awake/alert Orientation Level: Oriented to person;Oriented to place;Oriented to time;Oriented to situation Attention: Sustained Sustained Attention: Appears intact Memory:  (not formally assessed) Awareness: Appears intact Problem Solving: Impaired Problem Solving Impairment: Verbal complex;Functional complex Safety/Judgment: Appears intact       Comprehension  Auditory Comprehension Overall Auditory Comprehension: Impaired at baseline Commands: Impaired Multistep Basic Commands: 75-100% accurate Conversation: Simple Visual Recognition/Discrimination Discrimination: Not  tested Reading Comprehension Reading Status: Not tested    Expression Expression Primary Mode of Expression: Verbal Verbal Expression Overall Verbal Expression: Impaired at baseline Initiation: Impaired Level of Generative/Spontaneous Verbalization: Conversation Repetition: No impairment Naming: No impairment Pragmatics: No impairment Written Expression Dominant Hand: Right Written Expression:  (baseline difficulty, wrote name and part of sentence)   Oral / Motor  Oral Motor/Sensory Function Overall Oral Motor/Sensory Function: Within functional limits Motor Speech Overall Motor Speech: Impaired Respiration: Within functional limits Phonation: Normal Resonance: Within functional limits Articulation: Within functional limitis Intelligibility: Intelligible Motor Planning: Impaired Level of Impairment: Sentence Motor Speech Errors: Aware Effective Techniques: Slow rate   GO                    Houston Siren 04/17/2017, 3:30 PM   Orbie Pyo Chiniqua Kilcrease M.Ed [employer] [phone]

## 2017-04-17 NOTE — Care Management (Signed)
CM received call concerning patient being discharged home this evening. Patient is active with [facility] and will resume services. Patient states he is unsure how he travels home. ED CM contacted on call nurse at [facility], concerning transportation home, she was unsure. CM contacted patient's son Blake Bell. Emergency contact he will arrange to have family come and transport patient home today. Updated RN on 3W. No further CM needs identified.

## 2017-04-17 NOTE — Progress Notes (Signed)
[facility] Progress Note  Subjective: no c/o, on HD  Vitals:   04/17/17 0900 04/17/17 0930 04/17/17 1000 04/17/17 1015  BP: 102/64 103/63 (!) 87/50 (!) 84/51  Pulse: 85 92 88 86  Resp: 20 19 20 19   Temp:      TempSrc:      SpO2: 99% 99% 100% 99%  Weight:      Height:        Inpatient medications: . acetaminophen  650 mg Oral Q8H  . atorvastatin  80 mg Oral q1800  . cinacalcet  60 mg Oral Q T,Th,Sa-HD  . clopidogrel  75 mg Oral Daily  . escitalopram  10 mg Oral QHS  . feeding supplement (PRO-STAT SUGAR FREE 64)  30 mL Oral BID  . heparin  5,000 Units Subcutaneous Q8H  . heparin  5,000 Units Dialysis Once in dialysis  . lactulose  20 g Oral BID  . lanthanum  1,000 mg Oral TID WC  . LORazepam  1 mg Intravenous Once  . multivitamin  1 tablet Oral Daily   . sodium chloride    . sodium chloride     sodium chloride, sodium chloride, alteplase, heparin, hydrALAZINE, lidocaine (PF), lidocaine-prilocaine, ondansetron (ZOFRAN) IV, pentafluoroprop-tetrafluoroeth, senna-docusate, zolpidem  Exam: Alert, no distress No jvd Chest clear bilat RRR no mrg Abd obese soft ntnd Ext no edema NF, Ox 3, +aphasia R IJ TDC/ L BC AVF maturing+bruit  Dialysis: [facility] TTS  4h 76min  138kg   2/2   Hep 10000    RIJ TDC / L BC AVF (placed 04/02/17) - No VDRA/No ESA  - Venofer 100mg  IV x 10 (until 11/1) - Sensipar 60mg  PO q HD      Impression: 1. Acute CVA - h/o prior CVA, Brain MRI acute/subacute right parietal subcortical infarction. Stroke work-up per neurology   2.  ESRD -  HD TTS 3.  HTN - no outpatient BP meds.  Avoid hypotension on HD. 4.  Voluem - is at dry wt today 5.  Anemia  - Hgb 12.2 No ESA needs  6.  Metabolic bone disease -  Cont Fosrenol binder  7.  Nutrition - Renal diet/vitamins/prostat for low albumin  8. DM- per primary  9. CHF - EF 09-60%, grade 1 diastolic dysfunction   Plan - HD today, no UF, at dry wt   Kelly Splinter MD [place] pager [phone]   04/17/2017, 10:33 AM    Recent Labs Lab 04/15/17 1814 04/16/17 0716 04/17/17 0748  NA 137 137 137  K 3.0* 3.4* 3.6  CL 95* 97* 97*  CO2 30 28 27   GLUCOSE 116* 114* 134*  BUN 24* 34* 52*  CREATININE 4.16* 5.42* 7.33*  CALCIUM 8.6* 8.7* 9.1  PHOS  --   --  4.9*    Recent Labs Lab 04/15/17 1814 04/17/17 0748  AST 21  --   ALT 21  --   ALKPHOS 96  --   BILITOT 0.5  --   PROT 7.6  --   ALBUMIN 3.5 3.1*    Recent Labs Lab 04/15/17 1757 04/15/17 1814 04/17/17 0749  WBC  --  8.4 11.6*  NEUTROABS  --  4.3  --   HGB 13.9 12.2* 11.7*  HCT 41.0 37.0* 36.1*  MCV  --  95.4 97.8  PLT  --  302 324   Iron/TIBC/Ferritin/ %Sat    Component Value Date/Time   IRON 10 (L) 06/04/2015 0430   TIBC 197 (L) 06/04/2015 0430  IRONPCTSAT 5 (L) 06/04/2015 0430

## 2017-04-17 NOTE — Progress Notes (Signed)
HD completed without issue. No UF per MD. Patient tolerated well. Post bp 120/71. Denies pain/complaints. Report called to primary RN.

## 2017-04-18 LAB — ECHOCARDIOGRAM COMPLETE
AVLVOTPG: 6 mmHg
CHL CUP DOP CALC LVOT VTI: 22 cm
CHL CUP MV DEC (S): 269
E decel time: 269 msec
EERAT: 4.92
FS: 45 % — AB (ref 28–44)
HEIGHTINCHES: 70 in
IV/PV OW: 0.85
LA ID, A-P, ES: 48 mm
LA diam index: 1.92 cm/m2
LA vol A4C: 70.1 ml
LA vol index: 26.6 mL/m2
LA vol: 66.5 mL
LDCA: 3.14 cm2
LEFT ATRIUM END SYS DIAM: 48 mm
LV TDI E'LATERAL: 14.3
LV e' LATERAL: 14.3 cm/s
LVEEAVG: 4.92
LVEEMED: 4.92
LVOT SV: 69 mL
LVOT diameter: 20 mm
LVOTPV: 126 cm/s
MV pk A vel: 106 m/s
MV pk E vel: 70.4 m/s
PW: 14.3 mm — AB (ref 0.6–1.1)
RV LATERAL S' VELOCITY: 18.9 cm/s
TAPSE: 19.4 mm
TDI e' medial: 5.44
WEIGHTICAEL: 4871.28 [oz_av]

## 2017-04-18 LAB — BASIC METABOLIC PANEL
Anion gap: 12 (ref 5–15)
BUN: 36 mg/dL — AB (ref 6–20)
CO2: 24 mmol/L (ref 22–32)
Calcium: 9.4 mg/dL (ref 8.9–10.3)
Chloride: 101 mmol/L (ref 101–111)
Creatinine, Ser: 5.66 mg/dL — ABNORMAL HIGH (ref 0.61–1.24)
GFR calc Af Amer: 11 mL/min — ABNORMAL LOW (ref >60)
GFR calc non Af Amer: 9 mL/min — ABNORMAL LOW (ref >60)
GLUCOSE: 141 mg/dL — AB (ref 65–99)
POTASSIUM: 4.1 mmol/L (ref 3.5–5.1)
Sodium: 137 mmol/L (ref 135–145)

## 2017-04-18 LAB — URINALYSIS, ROUTINE W REFLEX MICROSCOPIC
BILIRUBIN URINE: NEGATIVE
Glucose, UA: NEGATIVE mg/dL
HGB URINE DIPSTICK: NEGATIVE
Ketones, ur: NEGATIVE mg/dL
LEUKOCYTES UA: NEGATIVE
Nitrite: NEGATIVE
Specific Gravity, Urine: 1.02 (ref 1.005–1.030)
pH: 5 (ref 5.0–8.0)

## 2017-04-18 LAB — RAPID URINE DRUG SCREEN, HOSP PERFORMED
AMPHETAMINES: NOT DETECTED
Barbiturates: NOT DETECTED
Benzodiazepines: NOT DETECTED
COCAINE: NOT DETECTED
OPIATES: NOT DETECTED
Tetrahydrocannabinol: NOT DETECTED

## 2017-04-18 LAB — GLUCOSE, CAPILLARY: Glucose-Capillary: 151 mg/dL — ABNORMAL HIGH (ref 65–99)

## 2017-04-18 MED ORDER — AQUAPHOR EX OINT
TOPICAL_OINTMENT | Freq: Every day | CUTANEOUS | 0 refills | Status: DC | PRN
Start: 2017-04-18 — End: 2017-08-28

## 2017-04-18 MED ORDER — AQUAPHOR EX OINT
TOPICAL_OINTMENT | Freq: Every day | CUTANEOUS | Status: DC | PRN
  Filled 2017-04-18: qty 50

## 2017-04-18 NOTE — Care Management Note (Signed)
Case Management Note  Patient Details  Name: Chrisangel Eskenazi Sr. MRN: [id number] Date of Birth: 1950-10-01  Subjective/Objective:                    Action/Plan: Patient discharging home. CM spoke with [facility] and patients son. [facility] is aware of d/c and will provide transportation home for him today. [facility] left message informing son of the above.   Expected Discharge Date:  04/18/17               Expected Discharge Plan:  Home/Self Care (active with [facility])  In-House Referral:     Discharge planning Services  CM Consult  Post Acute Care Choice:    Choice offered to:     DME Arranged:    DME Agency:     HH Arranged:    [place] Agency:     Status of Service:  Completed, signed off  If discussed at [employer] of Stay Meetings, dates discussed:    Additional Comments:  Pollie Friar, RN 04/18/2017, 1:24 PM

## 2017-04-18 NOTE — Progress Notes (Signed)
Subjective: Blake Bell was seen resting in a chair eating his breakfast this morning. He was pleasant and happy to be discharged today. He denied any new signs of weakness, nausea, vomiting, or headaches. He does state that he has dizziness when he gets up too fast.  Patient states that he had some itching during dialysis yesterday and last night 04/17/2017 that was generalized over the body.  The team spoke to the patient about plans for discharge and explained his diagnosis and treatment course.  The patient's questions were answered.  Objective:  Vital signs in last 24 hours: Vitals:   04/17/17 2131 04/17/17 2145 04/18/17 0126 04/18/17 0500  BP: (!) 136/59  132/61 138/83  Pulse: 91  87 87  Resp: 20 18 20 20   Temp: 98.7 F (37.1 C)  98.5 F (36.9 C) 98.5 F (36.9 C)  TempSrc: Oral  Oral Oral  SpO2: 97%  98% 96%  Weight:      Height:       Physical Exam  Constitutional: He is oriented to person, place, and time. He appears well-developed and well-nourished.  HENT:  Head: Normocephalic and atraumatic.  Eyes: Conjunctivae are normal.  Cardiovascular: Normal rate, regular rhythm and normal heart sounds.   Pulmonary/Chest: Effort normal and breath sounds normal. No respiratory distress. He has no wheezes.  Abdominal: Soft. Bowel sounds are normal. He exhibits no distension. There is no tenderness.  Neurological: He is alert and oriented to person, place, and time. No cranial nerve deficit.  Psychiatric: He has a normal mood and affect. His behavior is normal. Judgment and thought content normal.    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 The patient's imaging showed three total subcentimeter acute ischemic nonhemorrhagic infarcts involving the left parietal lobe, left splenium, and right frontal lobe  secondary to small vessel disease. He was also thought to have a component of dialysis disequilibrium syndrome causing his neurological deficits.  Workup- -CT head negative for acute intracranial abnormalities, atrophy and small vessel disease, cerebrovascular atherosclerotic change -MR brain showed small acute/early subacute infarction within the right parietal subcortical white matter, stable moderate chronic microvascular ischemic changes and moderate parenchymal volume loss of brain, lacunar infarcts in left basal ganglia, right proximal M1 moderate stenosis -Vas US Carotid Duplex showed right ICA 1-39% stenosis. Left carotid showed 1-39% stenosis , right subclavian artery was not visualized, and vertebrals were not well visualized.  -Lipid panel: tcholes=175, hdl=46, ldl=101 -HgBA1c=6.8 -PT made recommendation that patient is safe to be discharged with [facility] program  Plan: -Lipitor 80mg  qd, LDL goal <70 -The patient was placed on Plavix 75mg  qd -The patient is on heparin 5000 units every 8 hours -Echo read pending and will discharge if normal  ESRD Hemodialsys schedule TTS  -nephrology following  -continue fosrenol, conacelet -BMP this morning 04/18/17 showed creatinine = 1.56 down from creatinine=7.33 yesterday.  K = 4.1, sodium = 137  OSA -CPAP at night   Essential Hypertension The patient's blood pressure during the past 24 hrs has ranged 84-138/51-83 The patient's blood pressures seem stable no antihypertensives needed at this time.  Diabetes Mellitus Type 2 -HgBA1c=6.8, outpatient follow-up recommended -CBG monitoring inpatient   Itching  -aquaphor as needed   F: none E: none N: renal diet with 1223mL fluid restriction DVT: Heparin  Dispo: Anticipated discharge today.   [name], MD Internal Medicine PGY1  KAJGO:[phone] 04/18/2017, 6:24 AM

## 2017-04-18 NOTE — Progress Notes (Signed)
Physical Therapy Treatment Patient Details Name: Blake Slinger Sr. MRN: [id number] DOB: 1950-07-10 Today's Date: 04/18/2017    History of Present Illness 66 y.o. male with medical history significant of hypertension, hyperlipidemia, diet controlled diabetes mellitus, stroke, depression, dCHF, OSA on CPAP, ESRD-HD (TTS), anemia, who presents with aphasia and confusion.  MRI revealed Subcentimeter acute/early subacute infarction within the right parietal subcortical white matter.    PT Comments    Pt is making good progress towards his goals. Pt currently supervision for safety with transfers and ambulation of 120 feet with RW. Pt reports that he still feels a little weaker on his L side than before he came into the hospital but is much better. PT supports resumption of [facility] program at discharge to continue to work on strengthening and mobility.     Follow Up Recommendations  Other (comment) (resume [facility] at d/c)     Equipment Recommendations  None recommended by PT    Recommendations for Other Services       Precautions / Restrictions Precautions Precautions: Fall Restrictions Weight Bearing Restrictions: No    Mobility  Bed Mobility               General bed mobility comments: sitting EoB at entry  Transfers Overall transfer level: Needs assistance Equipment used: Rolling walker (2 wheeled) Transfers: Sit to/from Stand Sit to Stand: Supervision Stand pivot transfers: Supervision       General transfer comment: supervision for safety.  vc for safe use of RW to stand and pivot to wc for discharge  Ambulation/Gait Ambulation/Gait assistance: Supervision Ambulation Distance (Feet): 120 Feet Assistive device: Rolling walker (2 wheeled) Gait Pattern/deviations: Step-through pattern;Trunk flexed Gait velocity: slowed Gait velocity interpretation: Below normal speed for age/gender General Gait Details: Supervision for safety, Verbal cues for staying in walker and  for upright posture    Modified Rankin (Stroke Patients Only) Modified Rankin (Stroke Patients Only) Pre-Morbid Rankin Score: Moderate disability Modified Rankin: Moderate disability     Balance Overall balance assessment: Needs assistance Sitting-balance support: Feet supported;No upper extremity supported Sitting balance-Leahy Scale: Good     Standing balance support: No upper extremity supported Standing balance-Leahy Scale: Fair Standing balance comment: supervision for static standing                            Cognition Arousal/Alertness: Awake/alert Behavior During Therapy: Impulsive Overall Cognitive Status: History of cognitive impairments - at baseline                                 General Comments: No family present to report baseline.              Pertinent Vitals/Pain Pain Assessment: No/denies pain           PT Goals (current goals can now be found in the care plan section) Acute Rehab PT Goals Patient Stated Goal: return home PT Goal Formulation: With patient Time For Goal Achievement: 04/30/17 Potential to Achieve Goals: Good Progress towards PT goals: Progressing toward goals    Frequency    Min 4X/week      PT Plan Current plan remains appropriate       AM-PAC PT "6 Clicks" Daily Activity  Outcome Measure  Difficulty turning over in bed (including adjusting bedclothes, sheets and blankets)?: A Lot Difficulty moving from lying on back to sitting on the side of the bed? :  A Lot Difficulty sitting down on and standing up from a chair with arms (e.g., wheelchair, bedside commode, etc,.)?: A Little Help needed moving to and from a bed to chair (including a wheelchair)?: A Little Help needed walking in hospital room?: A Little Help needed climbing 3-5 steps with a railing? : A Lot 6 Click Score: 15    End of Session Equipment Utilized During Treatment: Gait belt;Other (comment) (we donned his shoes for  gait) Activity Tolerance: Patient limited by fatigue Patient left: in chair;with call bell/phone within reach;with chair alarm set Nurse Communication: Mobility status PT Visit Diagnosis: Muscle weakness (generalized) (M62.81);Other symptoms and signs involving the nervous system (P79.480)     Time: 1655-3748 PT Time Calculation (min) (ACUTE ONLY): 10 min  Charges:  $Gait Training: 8-22 mins                    G Codes:       Blake Bell B. Migdalia Dk PT, DPT Acute Rehabilitation  [phone] Pager [phone]     [place] 04/18/2017, 2:41 PM

## 2017-04-18 NOTE — Progress Notes (Signed)
[facility] Progress Note  Subjective: no c/o,  Says he might be going home today  Vitals:   04/17/17 2145 04/18/17 0126 04/18/17 0500 04/18/17 1027  BP:  132/61 138/83 (!) 142/88  Pulse:  87 87 88  Resp: 18 20 20  (!) 21  Temp:  98.5 F (36.9 C) 98.5 F (36.9 C) 98 F (36.7 C)  TempSrc:  Oral Oral Oral  SpO2:  98% 96% 92%  Weight:      Height:        Inpatient medications: . acetaminophen  650 mg Oral Q8H  . atorvastatin  80 mg Oral q1800  . cinacalcet  60 mg Oral Q T,Th,Sa-HD  . clopidogrel  75 mg Oral Daily  . escitalopram  10 mg Oral QHS  . feeding supplement (PRO-STAT SUGAR FREE 64)  30 mL Oral BID  . heparin  5,000 Units Subcutaneous Q8H  . lanthanum  1,000 mg Oral TID WC  . LORazepam  1 mg Intravenous Once  . multivitamin  1 tablet Oral Daily    mineral oil-hydrophilic petrolatum, ondansetron (ZOFRAN) IV, senna-docusate, zolpidem  Exam: Alert, no distress No jvd Chest clear bilat RRR no mrg Abd obese soft ntnd Ext no edema NF, Ox 3, +aphasia R IJ TDC/ L BC AVF maturing+bruit  Dialysis: [facility] TTS  4h 25min  138kg   2/2   Hep 10000    RIJ TDC / L BC AVF (placed 04/02/17) - No VDRA/No ESA  - Venofer 100mg  IV x 10 (until 11/1) - Sensipar 60mg  PO q HD      Impression: 1. Acute CVA - h/o prior CVA, Brain MRI acute/subacute right parietal subcortical infarction. +aphasia  2.  ESRD -  HD TTS 3.  HTN - no home BP meds. Avoid hypotension on HD given recent CVA.  4.  Voluem - is at dry wt today 5.  Anemia  - Hgb 12.2 No ESA needs  6.  Metabolic bone disease -  Cont Fosrenol binder  7.  Nutrition - Renal diet/vitamins/prostat for low albumin  8. DM- per primary  9. CHF - EF 16-07%, grade 1 diastolic dysfunction   Plan - HD Sat (if still here), min UF   Kelly Splinter MD [facility] pager [phone]   04/18/2017, 1:35 PM    Recent Labs Lab 04/16/17 0716 04/17/17 0748 04/18/17 0433  NA 137 137 137  K 3.4* 3.6 4.1  CL 97*  97* 101  CO2 28 27 24   GLUCOSE 114* 134* 141*  BUN 34* 52* 36*  CREATININE 5.42* 7.33* 5.66*  CALCIUM 8.7* 9.1 9.4  PHOS  --  4.9*  --     Recent Labs Lab 04/15/17 1814 04/17/17 0748  AST 21  --   ALT 21  --   ALKPHOS 96  --   BILITOT 0.5  --   PROT 7.6  --   ALBUMIN 3.5 3.1*    Recent Labs Lab 04/15/17 1757 04/15/17 1814 04/17/17 0749  WBC  --  8.4 11.6*  NEUTROABS  --  4.3  --   HGB 13.9 12.2* 11.7*  HCT 41.0 37.0* 36.1*  MCV  --  95.4 97.8  PLT  --  302 324   Iron/TIBC/Ferritin/ %Sat    Component Value Date/Time   IRON 10 (L) 06/04/2015 0430   TIBC 197 (L) 06/04/2015 0430   IRONPCTSAT 5 (L) 06/04/2015 0430

## 2017-04-18 NOTE — Discharge Instructions (Signed)
It was a pleasure to take care of you Blake Bell. During your admission you were diagnosed and managed for a stroke which means that there was decreased blood flow to your brain.   -Please follow with neurology after you are discharged -Please follow up with your primary care physician after discharge -Please resume your dialysis sessions from tomorrow   Thank you,  Blake Mage, MD  What is a stroke? Stroke is the term doctors use when a part of the brain is damaged because of a problem with blood flow. Strokes can happen when:  ?An artery going to the brain gets clogged or closes off, and part of the brain goes without blood for too long  ?An artery breaks open and starts bleeding into or around the brain  How do strokes affect people?--The effects of a stroke depend on a lot of things, including: ?Which part and how much of the brain is affected ?How quickly the stroke is treated Some people who have a stroke have no lasting effects. Others lose important brain functions. For example, some people become partly paralyzed or unable to speak. Stroke is one of the leading causes of death and disability in the world. How can you tell if someone is having a stroke?--There is an easy way to remember the signs of a stroke. Just think of the word "FAST" (figure 1). Each letter in the word stands for one of the things you should watch for: ?Face - Does the person's face look uneven or droop on one side? ?Arm - Does the person have weakness or numbness in one or both arms? Does one arm drift down if the person tries to hold both arms out? ?Speech - Is the person having trouble speaking? Does his or her speech sound strange? ?Time - If you notice any of these stroke signs, call for an ambulance (in the [place] and [place], [place] 9-1-1). You need to act FAST. The sooner treatment begins, the better the chances of recovery. How are strokes treated?--The right treatment depends on what kind of  stroke you are having. You need to get to the hospital very quickly to figure this out. People whose strokes are caused by clogged arteries can: ?Get treatments that help reopen clogged arteries. These treatments can help you recover from the stroke. ?Get medicines that prevent new blood clots. These medicines also help prevent future strokes. People whose strokes are caused by bleeding can: ?Have treatments that might reduce the damage caused by bleeding in or around the brain ?Stop taking medicines that increase bleeding, or take a lower dose ?Have surgery to repair the artery or stop the bleeding (this is not always possible to do) Can strokes be prevented?--Many strokes can be prevented, though not all. You can greatly lower your chance of having a stroke by: ?Taking your medicines exactly as directed. Medicines that are especially important in preventing strokes include: Blood pressure medicines Medicines called statins, which lower cholesterol Medicines to prevent blood clots, such aspirin or blood thinners Medicines that help to keep your blood sugar as close to normal as possible (if you have diabetes) ?Making lifestyle changes: Stop smoking, if you smoke Get regular exercise (if your doctor says it's safe) for at least 30 minutes a day on most days of the week Lose weight, if you are overweight Eat a diet rich in fruits, vegetables, and low-fat dairy products, and low in meats, sweets, and refined grains (such as white bread or white rice) Eat less  salt (sodium) Limit the amount of alcohol you drink -If you are a woman, do not drink more than 1 drink a day -If you are a man, do not drink more than 2 drinks a day Another way to prevent strokes is to have surgery to reopen clogged arteries in the neck. This surgery is appropriate for only a small group of people.  What is a "TIA"? A TIA is like a stroke, but it does not damage the brain. TIAs happen when an artery in the  brain gets clogged or closes off and then reopens on its own. This can happen if a blood clot forms and then moves away or dissolves. TIA stands for "transient ischemic attack." Even though TIAs do not cause lasting symptoms, they are serious. If you have a TIA, you are at high risk of having a stroke. It's important that you see a doctor and take steps to prevent that from happening. Do not ignore the symptoms of a stroke even if they go away!

## 2017-04-18 NOTE — Progress Notes (Signed)
Occupational Therapy Treatment Patient Details Name: Blake Murguia Sr. MRN: [id number] DOB: 12-30-1950 Today's Date: 04/18/2017    History of present illness 66 y.o. male with medical history significant of hypertension, hyperlipidemia, diet controlled diabetes mellitus, stroke, depression, dCHF, OSA on CPAP, ESRD-HD (TTS), anemia, who presents with aphasia and confusion.  MRI revealed Subcentimeter acute/early subacute infarction within the right parietal subcortical white matter.   OT comments  Pt. Agreeable to participation.  Able to complete seated LB dressing tasks and grooming tasks standing at sink.    Follow Up Recommendations  No OT follow up;Supervision/Assistance - 24 hour    Equipment Recommendations  None recommended by OT    Recommendations for Other Services      Precautions / Restrictions Precautions Precautions: Fall Restrictions Weight Bearing Restrictions: No       Mobility Bed Mobility               General bed mobility comments: seated eob at beginning of session, and seated in recliner at end of session, no bed mobility observed this session  Transfers Overall transfer level: Needs assistance Equipment used: Rolling walker (2 wheeled) Transfers: Sit to/from [facility] Sit to Stand: Min guard Stand pivot transfers: Min guard       General transfer comment: Min guard assist for safety.  Verbal cues for safe hand placement during transitions.     Balance                                           ADL either performed or assessed with clinical judgement   ADL Overall ADL's : Needs assistance/impaired     Grooming: Oral care;Standing;Min guard               Lower Body Dressing: Min guard;Sit to/from stand Lower Body Dressing Details (indicate cue type and reason): Able to adjust socks in sitting             Functional mobility during ADLs: Min guard;Rolling walker       Vision        Perception     Praxis      Cognition Arousal/Alertness: Awake/alert Behavior During Therapy: Impulsive Overall Cognitive Status: History of cognitive impairments - at baseline                                 General Comments: No family present to report baseline.         Exercises     Shoulder Instructions       General Comments      Pertinent Vitals/ Pain       Pain Assessment: No/denies pain  Home Living                                          Prior Functioning/Environment              Frequency  Min 2X/week        Progress Toward Goals  OT Goals(current goals can now be found in the care plan section)  Progress towards OT goals: Progressing toward goals     Plan Discharge plan remains appropriate    Co-evaluation  AM-PAC PT "6 Clicks" Daily Activity     Outcome Measure   Help from another person eating meals?: None Help from another person taking care of personal grooming?: A Little Help from another person toileting, which includes using toliet, bedpan, or urinal?: A Little Help from another person bathing (including washing, rinsing, drying)?: A Little Help from another person to put on and taking off regular upper body clothing?: A Little Help from another person to put on and taking off regular lower body clothing?: A Little 6 Click Score: 19    End of Session Equipment Utilized During Treatment: Rolling walker  OT Visit Diagnosis: Unsteadiness on feet (R26.81);Other abnormalities of gait and mobility (R26.89)   Activity Tolerance Patient tolerated treatment well   Patient Left in chair;with call bell/phone within reach;with chair alarm set;with nursing/sitter in room   Nurse Communication          Time: 7867-6720 OT Time Calculation (min): 10 min  Charges: OT General Charges $OT Visit: 1 Visit OT Treatments $Self Care/Home Management : 8-22 mins   Janice Coffin, COTA/L 04/18/2017, 9:01 AM

## 2017-04-19 ENCOUNTER — Other Ambulatory Visit: Payer: Self-pay

## 2017-04-19 ENCOUNTER — Encounter (HOSPITAL_COMMUNITY): Payer: Self-pay | Admitting: Emergency Medicine

## 2017-04-19 ENCOUNTER — Emergency Department (HOSPITAL_COMMUNITY): Payer: Medicare (Managed Care)

## 2017-04-19 ENCOUNTER — Emergency Department (HOSPITAL_COMMUNITY)
Admission: EM | Admit: 2017-04-19 | Discharge: 2017-04-19 | Disposition: A | Discharge status: Home / Self Care | Payer: Medicare (Managed Care) | Attending: Emergency Medicine | Admitting: Emergency Medicine

## 2017-04-19 DIAGNOSIS — Z87891 Personal history of nicotine dependence: Secondary | ICD-10-CM | POA: Insufficient documentation

## 2017-04-19 DIAGNOSIS — Z79899 Other long term (current) drug therapy: Secondary | ICD-10-CM | POA: Diagnosis not present

## 2017-04-19 DIAGNOSIS — I509 Heart failure, unspecified: Secondary | ICD-10-CM | POA: Insufficient documentation

## 2017-04-19 DIAGNOSIS — Z992 Dependence on renal dialysis: Secondary | ICD-10-CM | POA: Insufficient documentation

## 2017-04-19 DIAGNOSIS — I132 Hypertensive heart and chronic kidney disease with heart failure and with stage 5 chronic kidney disease, or end stage renal disease: Secondary | ICD-10-CM | POA: Diagnosis not present

## 2017-04-19 DIAGNOSIS — N186 End stage renal disease: Secondary | ICD-10-CM | POA: Diagnosis not present

## 2017-04-19 DIAGNOSIS — R1084 Generalized abdominal pain: Secondary | ICD-10-CM | POA: Insufficient documentation

## 2017-04-19 DIAGNOSIS — R0602 Shortness of breath: Secondary | ICD-10-CM | POA: Diagnosis not present

## 2017-04-19 DIAGNOSIS — E119 Type 2 diabetes mellitus without complications: Secondary | ICD-10-CM | POA: Insufficient documentation

## 2017-04-19 DIAGNOSIS — Z8673 Personal history of transient ischemic attack (TIA), and cerebral infarction without residual deficits: Secondary | ICD-10-CM | POA: Insufficient documentation

## 2017-04-19 LAB — CBC
HEMATOCRIT: 38.7 % — AB (ref 39.0–52.0)
Hemoglobin: 12.9 g/dL — ABNORMAL LOW (ref 13.0–17.0)
MCH: 32.4 pg (ref 26.0–34.0)
MCHC: 33.3 g/dL (ref 30.0–36.0)
MCV: 97.2 fL (ref 78.0–100.0)
PLATELETS: 321 10*3/uL (ref 150–400)
RBC: 3.98 MIL/uL — AB (ref 4.22–5.81)
RDW: 18.1 % — AB (ref 11.5–15.5)
WBC: 9 10*3/uL (ref 4.0–10.5)

## 2017-04-19 LAB — COMPREHENSIVE METABOLIC PANEL
ALK PHOS: 99 U/L (ref 38–126)
ALT: 31 U/L (ref 17–63)
AST: 31 U/L (ref 15–41)
Albumin: 3.6 g/dL (ref 3.5–5.0)
Anion gap: 10 (ref 5–15)
BILIRUBIN TOTAL: 0.5 mg/dL (ref 0.3–1.2)
BUN: 21 mg/dL — AB (ref 6–20)
CO2: 28 mmol/L (ref 22–32)
Calcium: 8.4 mg/dL — ABNORMAL LOW (ref 8.9–10.3)
Chloride: 93 mmol/L — ABNORMAL LOW (ref 101–111)
Creatinine, Ser: 3.91 mg/dL — ABNORMAL HIGH (ref 0.61–1.24)
GFR, EST AFRICAN AMERICAN: 17 mL/min — AB (ref >60)
GFR, EST NON AFRICAN AMERICAN: 15 mL/min — AB (ref >60)
Glucose, Bld: 115 mg/dL — ABNORMAL HIGH (ref 65–99)
Potassium: 3 mmol/L — ABNORMAL LOW (ref 3.5–5.1)
Sodium: 131 mmol/L — ABNORMAL LOW (ref 135–145)
TOTAL PROTEIN: 8 g/dL (ref 6.5–8.1)

## 2017-04-19 LAB — LIPASE, BLOOD: LIPASE: 61 U/L — AB (ref 11–51)

## 2017-04-19 NOTE — ED Triage Notes (Signed)
Per EMS:  Pt from dialysis, where he finished his full treatment.  Pt then began c/o SOB and abdominal pain, diffuse in nature, no n/v/d.  EMS noted clear lung sounds, 98% on RA.

## 2017-04-19 NOTE — ED Notes (Signed)
Patient transported to X-ray 

## 2017-04-19 NOTE — ED Notes (Signed)
Pt back from x-ray.

## 2017-04-19 NOTE — ED Notes (Signed)
Attempted to obtain a urine sample from pt pt states that he is unable to provide a urine sample due to being on dialysis

## 2017-04-19 NOTE — Discharge Instructions (Signed)
It was my pleasure taking care of you today!   Please follow up with your primary care doctor for further discussion of today's hospital visit.   Return to ER for chest pain, trouble breathing, new or worsening symptoms, any additional concerns.

## 2017-04-19 NOTE — ED Provider Notes (Signed)
[facility] EMERGENCY DEPARTMENT Provider Note   CSN: [id number] Arrival date & time: 04/19/17  1813     History   Chief Complaint Chief Complaint  Patient presents with  . Abdominal Pain    HPI Blake Zajkowski Sr. is a 66 y.o. male.  The history is provided by the patient and medical records. No language interpreter was used.  Abdominal Pain   Pertinent negatives include diarrhea, nausea, vomiting and constipation.   Blake Ina Sr. is a 66 y.o. male  with a PMH of HTN, HLD, ESRD on dialysis, prior CVA's who presents to the Emergency Department complaining of shortness of breath and abdominal pain after completing dialysis which have now resolved. Patient states that he finished his entire dialysis treatment. After finishing, he felt short of breath. This resolved when EMS placed oxygen. He also notes central abdominal pain which was sharp for a few minutes, probably less than 30 minutes, and this too has resolved. Patient now stating that he has no complaints. Denies any chest pain. No current shortness of breath or abdominal pain. No back pain, fever, chills, nausea, vomiting or diarrhea. No medications taken prior to arrival for symptoms.   Past Medical History:  Diagnosis Date  . Anemia   . CVA (cerebral vascular accident) [facility]) 12/2013   admitted in July 2015 for acute right-sided infarct/notes 12/02/2014- 01/28/17-  uses walker   . ESRD (end stage renal disease) ([place])    Hemo TTHSat  . Hypercholesterolemia   . Hypertension   . Multiple wounds    on lower legs - sees [name] Pain at the [facility]  . OSA on CPAP    uses cpap, setting of 5, 01/28/17- does not use any longer  . 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 Stroke ([place]) 12/02/2014   expressive aphasia 01/28/17- sometimes  . Systolic CHF ([place])   . Type II diabetes mellitus ([place])    uncontrolled/notes 12/02/2014    Patient Active Problem List   Diagnosis Date  Noted  . Stroke (cerebrum) ([place]) 04/15/2017  . Acute metabolic encephalopathy 62/13/0865  . Aphasia 04/15/2017  . ESRD on dialysis (TTS): 04/15/2017  . Hypokalemia 04/15/2017  . Word finding difficulty   . Pressure injury of skin 02/22/2017  . TIA (transient ischemic attack) 02/21/2017  . Expressive aphasia 09/06/2016  . Weakness following cerebrovascular accident (CVA) 09/06/2016  . Poor tolerance for ambulation 09/06/2016  . Chronic kidney disease (CKD), stage IV (severe) ([place]) 09/12/2015  . Chronic diastolic heart failure, NYHA class 2 ([place]) 08/08/2015  . Anemia, iron deficiency 06/04/2015  . Acute on chronic systolic (congestive) heart failure ([place]) 05/29/2015  . Systolic CHF ([place]) 78/46/9629  . Uncontrolled diabetes with stage 4 chronic kidney disease GFR 15-29 ([place]) 12/28/2014  . Type II diabetes mellitus with neurological manifestations ([place]) 12/12/2014  . Acute ischemic left MCA stroke ([place]) 12/02/2014  . Type 2 diabetes mellitus with complication ([place]) 52/84/1324  . Essential hypertension, benign 03/24/2014  . HLD (hyperlipidemia) 03/24/2014  . CVA (cerebral infarction) 01/13/2014  . Hypernatremia 07/03/2013  . Morbid obesity ([place]) 07/03/2013  . Venous stasis ulcers ([place]) 07/03/2013  . DM (diabetes mellitus) type 2, uncontrolled, with ketoacidosis ([place]) 07/03/2013  . 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 Sellar or suprasellar mass 06/22/2013  . Acute respiratory failure ([place]) 06/22/2013  . Seizure ([place]) 06/21/2013  . Altered mental status 06/21/2013  . Hypercarbia 06/21/2013  . OSA (obstructive sleep apnea) 05/25/2013    Past Surgical  History:  Procedure Laterality Date  . A/V FISTULAGRAM N/A 01/17/2017   Procedure: A/V Fistulagram - Left arm;  Surgeon: Elam Dutch, MD;  Location: [facility];  Service: Cardiovascular;  Laterality: N/A;  . AV FISTULA PLACEMENT Left 09/27/2015   Procedure: RADIOCEPHALIC ARTERIOVENOUS (AV) FISTULA CREATION LEFT ARM;  Surgeon:  Mal Misty, MD;  Location: [facility];  Service: Vascular;  Laterality: Left;  . AV FISTULA PLACEMENT Left 01/29/2017   Procedure: ARTERIOVENOUS (AV) FISTULA CREATION BRACHIOCEPHALIC;  Surgeon: Elam Dutch, MD;  Location: [facility];  Service: Vascular;  Laterality: Left;  . AV FISTULA PLACEMENT Left 04/02/2017   Procedure: ARTERIOVENOUS (AV) FISTULA CREATION LEFT BRACHIOCEPHALIC AND LIGATION LEFT RADIOCEPHALIC;  Surgeon: Elam Dutch, MD;  Location: [facility];  Service: Vascular;  Laterality: Left;  . COLONOSCOPY  6-7 yrs ago  . COLONOSCOPY N/A 04/05/2013   Procedure: COLONOSCOPY;  Surgeon: Juanita Craver, MD;  Location: [facility];  Service: Endoscopy;  Laterality: N/A;  . LIGATION OF ARTERIOVENOUS  FISTULA Left 01/29/2017   Procedure: LIGATION OF RADIOCEPHALIC ARTERIOVENOUS  FISTULA;  Surgeon: Elam Dutch, MD;  Location: [facility] OR;  Service: Vascular;  Laterality: Left;       Home Medications    Prior to Admission medications   Medication Sig Start Date End Date Taking? Authorizing Provider  acetaminophen (ACETAMINOPHEN 8 HOUR) 650 MG CR tablet Take 650 mg by mouth every 8 (eight) hours.    [provider]  atorvastatin (LIPITOR) 80 MG tablet Take 1 tablet (80 mg total) by mouth daily at 6 PM. Patient not taking: Reported on 03/21/2017 02/22/17   Debbe Odea, MD  cinacalcet (SENSIPAR) 30 MG tablet Take 1 tablet (30 mg total) by mouth Every Tuesday,Thursday,and Saturday with dialysis. 02/25/17   Debbe Odea, MD  clopidogrel (PLAVIX) 75 MG tablet TAKE 75 mg TABLET BY MOUTH EVERY DAY WITH BREAKFAST 02/22/17   Debbe Odea, MD  clopidogrel (PLAVIX) 75 MG tablet Take 1 tablet (75 mg total) by mouth daily. 04/18/17   Burgess Estelle, MD  escitalopram (LEXAPRO) 10 MG tablet Take 1 tablet by mouth daily in evening Patient taking differently: Take 10 mg by mouth at bedtime. Take 1 tablet by mouth daily in evening 11/05/16   Shawnee Knapp, MD  lanthanum (FOSRENOL) 1000 MG chewable tablet Chew  1 tablet by mouth 3 (three) times daily.     [provider]  mineral oil-hydrophilic petrolatum (AQUAPHOR) ointment Apply topically daily as needed for dry skin. 04/18/17   Burgess Estelle, MD  multivitamin (RENA-VIT) TABS tablet Take 1 tablet by mouth daily.    [provider]  senna-docusate (SENOKOT-S) 8.6-50 MG tablet Take 1 tablet by mouth at bedtime as needed for moderate constipation. Patient taking differently: Take 2 tablets by mouth at bedtime as needed for moderate constipation.  02/22/17   Debbe Odea, MD    Family History Family History  Problem Relation Age of Onset  . Diabetes Mother   . Hypertension Mother   . Diabetes Father   . Hypertension Father   . Asthma Son   . Diabetes Sister   . Diabetes Brother     Social History Social History  Substance Use Topics  . Smoking status: Former Smoker    Packs/day: 0.25    Years: 2.00    Types: Cigarettes    Quit date: 06/17/1970  . Smokeless tobacco: Never Used  . Alcohol use No     Allergies   Patient has no known allergies.  Review of Systems Review of Systems  Respiratory: Positive for shortness of breath (Resolved). Negative for cough and wheezing.   Gastrointestinal: Positive for abdominal pain (Resolved). Negative for blood in stool, constipation, diarrhea, nausea and vomiting.  All other systems reviewed and are negative.    Physical Exam Updated Vital Signs BP (!) 142/81 (BP Location: Right Arm)   Pulse 89   Temp 98 F (36.7 C) (Oral)   Resp 20   SpO2 99%   Physical Exam  Constitutional: He is oriented to person, place, and time. He appears well-developed and well-nourished. No distress.  HENT:  Head: Normocephalic and atraumatic.  Neck: Neck supple.  Cardiovascular: Normal rate, regular rhythm and normal heart sounds.   No murmur heard. Pulmonary/Chest: Effort normal and breath sounds normal. No respiratory distress. He has no wheezes. He has no rales. He exhibits no  tenderness.  Abdominal: Soft. He exhibits no distension.  No abdominal tenderness.  Neurological: He is alert and oriented to person, place, and time.  Skin: Skin is warm and dry.  Nursing note and vitals reviewed.    ED Treatments / Results  Labs (all labs ordered are listed, but only abnormal results are displayed) Labs Reviewed  LIPASE, BLOOD - Abnormal; Notable for the following:       Result Value   Lipase 61 (*)    All other components within normal limits  COMPREHENSIVE METABOLIC PANEL - Abnormal; Notable for the following:    Sodium 131 (*)    Potassium 3.0 (*)    Chloride 93 (*)    Glucose, Bld 115 (*)    BUN 21 (*)    Creatinine, Ser 3.91 (*)    Calcium 8.4 (*)    GFR calc non Af Amer 15 (*)    GFR calc Af Amer 17 (*)    All other components within normal limits  CBC - Abnormal; Notable for the following:    RBC 3.98 (*)    Hemoglobin 12.9 (*)    HCT 38.7 (*)    RDW 18.1 (*)    All other components within normal limits  URINALYSIS, ROUTINE W REFLEX MICROSCOPIC    EKG  EKG Interpretation None       Radiology Dg Chest 2 View  Result Date: 04/19/2017 CLINICAL DATA:  Patient with dialysis. Shortness of breath and abdominal pain. EXAM: CHEST  2 VIEW COMPARISON:  Chest radiograph 01/29/2017. FINDINGS: Central venous catheter projects over the superior vena cava. Stable cardiomegaly. Pulmonary vascular redistribution and mild interstitial pulmonary opacities. Lateral view nondiagnostic due to overlapping soft tissue. IMPRESSION: Cardiomegaly and mild interstitial edema. Electronically Signed   By: Lovey Newcomer M.D.   On: 04/19/2017 19:18    Procedures Procedures (including critical care time)  Medications Ordered in ED Medications - No data to display   Initial Impression / Assessment and Plan / ED Course  I have reviewed the triage vital signs and the nursing notes.  Pertinent labs & imaging results that were available during my care of the patient were  reviewed by me and considered in my medical decision making (see chart for details).    Blake Norfleet Sr. is a 66 y.o. male who presents to ED for shortness of breath and abdominal pain which occurred after finishing dialysis today. By the time of ED arrival, all symptoms have resolved. Benign abdominal exam. Clear lungs. Afebrile, hemodynamically stable 98-99% O2 on RA. Labs reviewed and reassuring. Evaluation does not show pathology that would require ongoing emergent intervention  or inpatient treatment. PCP follow up. Return precautions discussed and all questions answered.   Patient discussed with Dr. Leonette Monarch who agrees with treatment plan.    Final Clinical Impressions(s) / ED Diagnoses   Final diagnoses:  Shortness of breath    New Prescriptions New Prescriptions   No medications on file     Ward, Ozella Almond, Hershal Coria 04/19/17 2107    Fatima Blank, MD 04/20/17 0040

## 2017-04-22 LAB — HEPATITIS B SURFACE ANTIGEN: HEP B S AG: NEGATIVE

## 2017-04-24 NOTE — Telephone Encounter (Signed)
Error

## 2017-05-15 ENCOUNTER — Encounter: Payer: Medicare (Managed Care) | Admitting: Vascular Surgery

## 2017-05-15 ENCOUNTER — Encounter (HOSPITAL_COMMUNITY): Payer: Medicare (Managed Care)

## 2017-06-26 ENCOUNTER — Ambulatory Visit (INDEPENDENT_AMBULATORY_CARE_PROVIDER_SITE_OTHER): Payer: Self-pay | Admitting: Vascular Surgery

## 2017-06-26 ENCOUNTER — Other Ambulatory Visit: Payer: Self-pay

## 2017-06-26 ENCOUNTER — Encounter: Payer: Self-pay | Admitting: Vascular Surgery

## 2017-06-26 ENCOUNTER — Ambulatory Visit (HOSPITAL_COMMUNITY)
Admission: RE | Admit: 2017-06-26 | Discharge: 2017-06-26 | Disposition: A | Discharge status: Home / Self Care | Payer: Medicare (Managed Care) | Source: Ambulatory Visit | Attending: Vascular Surgery | Admitting: Vascular Surgery

## 2017-06-26 VITALS — BP 137/82 | HR 58 | Temp 96.8°F | Resp 18 | Ht 69.5 in | Wt 305.0 lb

## 2017-06-26 DIAGNOSIS — N186 End stage renal disease: Secondary | ICD-10-CM | POA: Diagnosis not present

## 2017-06-26 DIAGNOSIS — Z48812 Encounter for surgical aftercare following surgery on the circulatory system: Secondary | ICD-10-CM | POA: Insufficient documentation

## 2017-06-26 DIAGNOSIS — Z992 Dependence on renal dialysis: Secondary | ICD-10-CM | POA: Insufficient documentation

## 2017-06-26 NOTE — Progress Notes (Signed)
Patient is a 67 year old male who returns for follow-up today after placement of a left brachiocephalic AV fistula April 02, 2017.  He denies any numbness or tingling in his hand.  He currently is dialyzing via right-sided catheter.  Physical exam:  Vitals:   06/26/17 0837  BP: 137/82  Pulse: (!) 58  Resp: 18  Temp: (!) 96.8 F (36 C)  TempSrc: Oral  SpO2: 95%  Weight: (!) 305 lb (138.3 kg)  Height: 5' 9.5" (1.765 m)    Left upper extremity: Well-healed antecubital incision palpable thrill in fistula fistula is palpable throughout most of the upper arm  Data: Patient had a duplex ultrasound of his AV fistula today.  This shows the fistula diameter is 8-12 mm 3-6 mm in depth.  Assessment: Mature AV fistula left arm  Plan: Should be able to cannulate starting next week.  The patient will get scheduled for removal of his catheter soon after that.  He will follow-up with me on an as-needed basis.  Ruta Hinds, MD [facility] Office: [phone] Pager: [phone]

## 2017-08-10 ENCOUNTER — Encounter (HOSPITAL_COMMUNITY): Payer: Self-pay

## 2017-08-10 ENCOUNTER — Emergency Department (HOSPITAL_COMMUNITY)
Admission: EM | Admit: 2017-08-10 | Discharge: 2017-08-10 | Disposition: A | Discharge status: Home / Self Care | Payer: Medicare (Managed Care) | Attending: Emergency Medicine | Admitting: Emergency Medicine

## 2017-08-10 ENCOUNTER — Emergency Department (HOSPITAL_COMMUNITY): Payer: Medicare (Managed Care)

## 2017-08-10 DIAGNOSIS — I132 Hypertensive heart and chronic kidney disease with heart failure and with stage 5 chronic kidney disease, or end stage renal disease: Secondary | ICD-10-CM | POA: Insufficient documentation

## 2017-08-10 DIAGNOSIS — Z7902 Long term (current) use of antithrombotics/antiplatelets: Secondary | ICD-10-CM | POA: Insufficient documentation

## 2017-08-10 DIAGNOSIS — Z79899 Other long term (current) drug therapy: Secondary | ICD-10-CM | POA: Insufficient documentation

## 2017-08-10 DIAGNOSIS — E7800 Pure hypercholesterolemia, unspecified: Secondary | ICD-10-CM | POA: Diagnosis not present

## 2017-08-10 DIAGNOSIS — R4182 Altered mental status, unspecified: Secondary | ICD-10-CM | POA: Diagnosis present

## 2017-08-10 DIAGNOSIS — Z87891 Personal history of nicotine dependence: Secondary | ICD-10-CM | POA: Insufficient documentation

## 2017-08-10 DIAGNOSIS — Z8673 Personal history of transient ischemic attack (TIA), and cerebral infarction without residual deficits: Secondary | ICD-10-CM | POA: Diagnosis not present

## 2017-08-10 DIAGNOSIS — R404 Transient alteration of awareness: Secondary | ICD-10-CM | POA: Diagnosis not present

## 2017-08-10 DIAGNOSIS — N186 End stage renal disease: Secondary | ICD-10-CM | POA: Insufficient documentation

## 2017-08-10 DIAGNOSIS — R4789 Other speech disturbances: Secondary | ICD-10-CM | POA: Diagnosis not present

## 2017-08-10 DIAGNOSIS — I5022 Chronic systolic (congestive) heart failure: Secondary | ICD-10-CM | POA: Diagnosis not present

## 2017-08-10 DIAGNOSIS — R479 Unspecified speech disturbances: Secondary | ICD-10-CM

## 2017-08-10 DIAGNOSIS — E1122 Type 2 diabetes mellitus with diabetic chronic kidney disease: Secondary | ICD-10-CM | POA: Insufficient documentation

## 2017-08-10 DIAGNOSIS — Z992 Dependence on renal dialysis: Secondary | ICD-10-CM | POA: Diagnosis not present

## 2017-08-10 LAB — BASIC METABOLIC PANEL
Anion gap: 14 (ref 5–15)
BUN: 32 mg/dL — AB (ref 6–20)
CALCIUM: 9.1 mg/dL (ref 8.9–10.3)
CHLORIDE: 96 mmol/L — AB (ref 101–111)
CO2: 28 mmol/L (ref 22–32)
CREATININE: 6.44 mg/dL — AB (ref 0.61–1.24)
GFR calc Af Amer: 9 mL/min — ABNORMAL LOW (ref >60)
GFR calc non Af Amer: 8 mL/min — ABNORMAL LOW (ref >60)
GLUCOSE: 152 mg/dL — AB (ref 65–99)
Potassium: 3.6 mmol/L (ref 3.5–5.1)
Sodium: 138 mmol/L (ref 135–145)

## 2017-08-10 LAB — CBC
HEMATOCRIT: 38.8 % — AB (ref 39.0–52.0)
HEMOGLOBIN: 12.6 g/dL — AB (ref 13.0–17.0)
MCH: 32.2 pg (ref 26.0–34.0)
MCHC: 32.5 g/dL (ref 30.0–36.0)
MCV: 99.2 fL (ref 78.0–100.0)
Platelets: 381 10*3/uL (ref 150–400)
RBC: 3.91 MIL/uL — ABNORMAL LOW (ref 4.22–5.81)
RDW: 15.8 % — AB (ref 11.5–15.5)
WBC: 12.4 10*3/uL — ABNORMAL HIGH (ref 4.0–10.5)

## 2017-08-10 NOTE — ED Notes (Signed)
Pt in CT.

## 2017-08-10 NOTE — ED Provider Notes (Signed)
[facility] EMERGENCY DEPARTMENT Provider Note   CSN: [id number] Arrival date & time: 08/10/17  1306     History   Chief Complaint Chief Complaint  Patient presents with  . Altered Mental Status    HPI Blake Schliep Sr. is a 67 y.o. male.  Patient with hx esrd/hd (had normal hd yesterday) cva, residual expressive aphasia challenges, presents after having a period this morning where he wasn't speaking or trying to speak. Symptoms lasted a few minutes.  Patient states he feels better now, and is speaking at his baseline. Pt denies any headache. He denies any change in vision. Pt denies any new numbness/weakness, or change in his baseline functional ability.    The history is provided by the patient and the EMS personnel. The history is limited by the condition of the patient.  Altered Mental Status   Pertinent negatives include no weakness.    Past Medical History:  Diagnosis Date  . Anemia   . CVA (cerebral vascular accident) [facility]) 12/2013   admitted in July 2015 for acute right-sided infarct/notes 12/02/2014- 01/28/17-  uses walker   . ESRD (end stage renal disease) ([place])    Hemo TTHSat  . Hypercholesterolemia   . Hypertension   . Multiple wounds    on lower legs - sees [name] Pain at the [facility]  . OSA on CPAP    uses cpap, setting of 5, 01/28/17- does not use any longer  . 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 Stroke ([place]) 12/02/2014   expressive aphasia 01/28/17- sometimes  . Systolic CHF ([place])   . Type II diabetes mellitus ([place])    uncontrolled/notes 12/02/2014    Patient Active Problem List   Diagnosis Date Noted  . Stroke (cerebrum) ([place]) 04/15/2017  . Acute metabolic encephalopathy 83/38/2505  . Aphasia 04/15/2017  . ESRD on dialysis (TTS): 04/15/2017  . Hypokalemia 04/15/2017  . Word finding difficulty   . Pressure injury of skin 02/22/2017  . TIA (transient ischemic attack) 02/21/2017  . Expressive  aphasia 09/06/2016  . Weakness following cerebrovascular accident (CVA) 09/06/2016  . Poor tolerance for ambulation 09/06/2016  . Chronic kidney disease (CKD), stage IV (severe) ([place]) 09/12/2015  . Chronic diastolic heart failure, NYHA class 2 ([place]) 08/08/2015  . Anemia, iron deficiency 06/04/2015  . Acute on chronic systolic (congestive) heart failure ([place]) 05/29/2015  . Systolic CHF ([place]) 39/76/7341  . Uncontrolled diabetes with stage 4 chronic kidney disease GFR 15-29 ([place]) 12/28/2014  . Type II diabetes mellitus with neurological manifestations ([place]) 12/12/2014  . Acute ischemic left MCA stroke ([place]) 12/02/2014  . Type 2 diabetes mellitus with complication ([place]) 93/79/0240  . Essential hypertension, benign 03/24/2014  . HLD (hyperlipidemia) 03/24/2014  . CVA (cerebral infarction) 01/13/2014  . Hypernatremia 07/03/2013  . Morbid obesity ([place]) 07/03/2013  . Venous stasis ulcers ([place]) 07/03/2013  . DM (diabetes mellitus) type 2, uncontrolled, with ketoacidosis ([place]) 07/03/2013  . 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 Sellar or suprasellar mass 06/22/2013  . Acute respiratory failure ([place]) 06/22/2013  . Seizure ([place]) 06/21/2013  . Altered mental status 06/21/2013  . Hypercarbia 06/21/2013  . OSA (obstructive sleep apnea) 05/25/2013    Past Surgical History:  Procedure Laterality Date  . A/V FISTULAGRAM N/A 01/17/2017   Procedure: A/V Fistulagram - Left arm;  Surgeon: Elam Dutch, MD;  Location: [facility];  Service: Cardiovascular;  Laterality: N/A;  . AV FISTULA PLACEMENT Left 09/27/2015   Procedure: RADIOCEPHALIC  ARTERIOVENOUS (AV) FISTULA CREATION LEFT ARM;  Surgeon: Mal Misty, MD;  Location[facility];  Service: Vascular;  Laterality: Left;  . AV FISTULA PLACEMENT Left 01/29/2017   Procedure: ARTERIOVENOUS (AV) FISTULA CREATION BRACHIOCEPHALIC;  Surgeon: Elam Dutch, MD;  Location: [facility] OR;  Service: Vascular;  Laterality: Left;  . AV FISTULA PLACEMENT Left  04/02/2017   Procedure: ARTERIOVENOUS (AV) FISTULA CREATION LEFT BRACHIOCEPHALIC AND LIGATION LEFT RADIOCEPHALIC;  Surgeon: Elam Dutch, MD;  Location: [facility];  Service: Vascular;  Laterality: Left;  . COLONOSCOPY  6-7 yrs ago  . COLONOSCOPY N/A 04/05/2013   Procedure: COLONOSCOPY;  Surgeon: Juanita Craver, MD;  Location: [facility];  Service: Endoscopy;  Laterality: N/A;  . LIGATION OF ARTERIOVENOUS  FISTULA Left 01/29/2017   Procedure: LIGATION OF RADIOCEPHALIC ARTERIOVENOUS  FISTULA;  Surgeon: Elam Dutch, MD;  Location: [facility] OR;  Service: Vascular;  Laterality: Left;       Home Medications    Prior to Admission medications   Medication Sig Start Date End Date Taking? Authorizing Provider  acetaminophen (ACETAMINOPHEN 8 HOUR) 650 MG CR tablet Take 650 mg by mouth every 8 (eight) hours.    [provider]  atorvastatin (LIPITOR) 80 MG tablet Take 1 tablet (80 mg total) by mouth daily at 6 PM. Patient not taking: Reported on 06/26/2017 02/22/17   Debbe Odea, MD  cinacalcet (SENSIPAR) 30 MG tablet Take 1 tablet (30 mg total) by mouth Every Tuesday,Thursday,and Saturday with dialysis. 02/25/17   Debbe Odea, MD  clopidogrel (PLAVIX) 75 MG tablet TAKE 75 mg TABLET BY MOUTH EVERY DAY WITH BREAKFAST 02/22/17   Debbe Odea, MD  clopidogrel (PLAVIX) 75 MG tablet Take 1 tablet (75 mg total) by mouth daily. 04/18/17   Burgess Estelle, MD  escitalopram (LEXAPRO) 10 MG tablet Take 1 tablet by mouth daily in evening Patient taking differently: Take 10 mg by mouth at bedtime. Take 1 tablet by mouth daily in evening 11/05/16   Shawnee Knapp, MD  lanthanum (FOSRENOL) 1000 MG chewable tablet Chew 1 tablet by mouth 3 (three) times daily.     [provider]  mineral oil-hydrophilic petrolatum (AQUAPHOR) ointment Apply topically daily as needed for dry skin. 04/18/17   Burgess Estelle, MD  multivitamin (RENA-VIT) TABS tablet Take 1 tablet by mouth daily.    [provider]    senna-docusate (SENOKOT-S) 8.6-50 MG tablet Take 1 tablet by mouth at bedtime as needed for moderate constipation. Patient taking differently: Take 2 tablets by mouth at bedtime as needed for moderate constipation.  02/22/17   Debbe Odea, MD    Family History Family History  Problem Relation Age of Onset  . Diabetes Mother   . Hypertension Mother   . Diabetes Father   . Hypertension Father   . Asthma Son   . Diabetes Sister   . Diabetes Brother     Social History Social History   Tobacco Use  . Smoking status: Former Smoker    Packs/day: 0.25    Years: 2.00    Pack years: 0.50    Types: Cigarettes    Last attempt to quit: 06/17/1970    Years since quitting: 47.1  . Smokeless tobacco: Never Used  Substance Use Topics  . Alcohol use: No    Alcohol/week: 0.0 oz  . Drug use: Yes    Types: Marijuana    Comment: H/O marijuana use x 1 many years ago     Allergies   Patient has no known allergies.  Review of Systems Review of Systems  Constitutional: Negative for fever.  HENT: Negative for sore throat.   Eyes: Negative for visual disturbance.  Respiratory: Negative for cough and shortness of breath.   Cardiovascular: Negative for chest pain.  Gastrointestinal: Negative for abdominal pain and vomiting.  Genitourinary: Negative for flank pain.  Musculoskeletal: Negative for neck pain.  Skin: Negative for rash.  Neurological: Negative for weakness, numbness and headaches.  Hematological: Does not bruise/bleed easily.  Psychiatric/Behavioral: The patient is not nervous/anxious.      Physical Exam Updated Vital Signs BP (!) 145/73 (BP Location: Right Arm)   Pulse 84   Temp 98.6 F (37 C) (Oral)   Resp (!) 32   Ht 1.765 m (5' 9.5")   Wt (!) 138.3 kg (305 lb)   SpO2 96%   BMI 44.39 kg/m   Physical Exam  Constitutional: He appears well-developed and well-nourished. No distress.  HENT:  Mouth/Throat: Oropharynx is clear and moist.  Eyes: Conjunctivae and  EOM are normal. Pupils are equal, round, and reactive to light.  Neck: Neck supple. No tracheal deviation present.  No bruits.   Cardiovascular: Normal rate, regular rhythm, normal heart sounds and intact distal pulses. Exam reveals no gallop and no friction rub.  No murmur heard. Pulmonary/Chest: Effort normal and breath sounds normal. No accessory muscle usage. No respiratory distress.  Abdominal: Soft. Bowel sounds are normal. He exhibits no distension. There is no tenderness.  Genitourinary:  Genitourinary Comments: No cva tenderness  Musculoskeletal: He exhibits no edema.  Neurological: He is alert.  Speech w mild dysarthria/aphasia (noted to be at his baseline per his [facility] nurse).  Responds to questions appropriately. Motor intact bil ext. sens grossly intact.   Skin: Skin is warm and dry. He is not diaphoretic.  Psychiatric: He has a normal mood and affect.  Nursing note and vitals reviewed.    ED Treatments / Results  Labs (all labs ordered are listed, but only abnormal results are displayed) Results for orders placed or performed during the hospital encounter of 08/10/17  CBC  Result Value Ref Range   WBC 12.4 (H) 4.0 - 10.5 K/uL   RBC 3.91 (L) 4.22 - 5.81 MIL/uL   Hemoglobin 12.6 (L) 13.0 - 17.0 g/dL   HCT 38.8 (L) 39.0 - 52.0 %   MCV 99.2 78.0 - 100.0 fL   MCH 32.2 26.0 - 34.0 pg   MCHC 32.5 30.0 - 36.0 g/dL   RDW 15.8 (H) 11.5 - 15.5 %   Platelets 381 150 - 400 K/uL  Basic metabolic panel  Result Value Ref Range   Sodium 138 135 - 145 mmol/L   Potassium 3.6 3.5 - 5.1 mmol/L   Chloride 96 (L) 101 - 111 mmol/L   CO2 28 22 - 32 mmol/L   Glucose, Bld 152 (H) 65 - 99 mg/dL   BUN 32 (H) 6 - 20 mg/dL   Creatinine, Ser 6.44 (H) 0.61 - 1.24 mg/dL   Calcium 9.1 8.9 - 10.3 mg/dL   GFR calc non Af Amer 8 (L) >60 mL/min   GFR calc Af Amer 9 (L) >60 mL/min   Anion gap 14 5 - 15   Ct Head Wo Contrast  Result Date: 08/10/2017 CLINICAL DATA:  Altered level of  consciousness.  No reported injury. EXAM: CT HEAD WITHOUT CONTRAST TECHNIQUE: Contiguous axial images were obtained from the base of the skull through the vertex without intravenous contrast. COMPARISON:  04/15/2017 head CT. FINDINGS: Brain: Stable small bilateral  basal ganglia lacunes. No evidence of parenchymal hemorrhage or extra-axial fluid collection. No mass lesion, mass effect, or midline shift. No CT evidence of acute infarction. Nonspecific prominent subcortical and periventricular white matter hypodensity, most in keeping with chronic small vessel ischemic change. No ventriculomegaly. Vascular: No acute abnormality. Skull: No evidence of calvarial fracture. Sinuses/Orbits: The visualized paranasal sinuses are essentially clear. Other:  The mastoid air cells are unopacified. IMPRESSION: 1.  No evidence of acute intracranial abnormality. 2. Prominent chronic small vessel ischemic changes in the cerebral white matter. Stable small bilateral basal ganglia lacunes. Electronically Signed   By: Ilona Sorrel M.D.   On: 08/10/2017 14:06    EKG  EKG Interpretation None       Radiology Ct Head Wo Contrast  Result Date: 08/10/2017 CLINICAL DATA:  Altered level of consciousness.  No reported injury. EXAM: CT HEAD WITHOUT CONTRAST TECHNIQUE: Contiguous axial images were obtained from the base of the skull through the vertex without intravenous contrast. COMPARISON:  04/15/2017 head CT. FINDINGS: Brain: Stable small bilateral basal ganglia lacunes. No evidence of parenchymal hemorrhage or extra-axial fluid collection. No mass lesion, mass effect, or midline shift. No CT evidence of acute infarction. Nonspecific prominent subcortical and periventricular white matter hypodensity, most in keeping with chronic small vessel ischemic change. No ventriculomegaly. Vascular: No acute abnormality. Skull: No evidence of calvarial fracture. Sinuses/Orbits: The visualized paranasal sinuses are essentially clear. Other:   The mastoid air cells are unopacified. IMPRESSION: 1.  No evidence of acute intracranial abnormality. 2. Prominent chronic small vessel ischemic changes in the cerebral white matter. Stable small bilateral basal ganglia lacunes. Electronically Signed   By: Ilona Sorrel M.D.   On: 08/10/2017 14:06    Procedures Procedures (including critical care time)  Medications Ordered in ED Medications - No data to display   Initial Impression / Assessment and Plan / ED Course  I have reviewed the triage vital signs and the nursing notes.  Pertinent labs & imaging results that were available during my care of the patient were reviewed by me and considered in my medical decision making (see chart for details).  Labs and imaging ordered.  Reviewed nursing notes and prior charts for additional history.   Recheck - pt continues to be at baseline. Denies any c/o. Vitals normal.  Patient currently appears stable for d/c.  Return precautions provided.     Final Clinical Impressions(s) / ED Diagnoses   Final diagnoses:  None    ED Discharge Orders    None       Lajean Saver, MD 08/10/17 [phone]

## 2017-08-10 NOTE — Discharge Instructions (Signed)
It was our pleasure to provide your ER care today - we hope that you feel better.  Follow up with your primary care doctor in the next 1-2 days.  Return to ER if worse, new symptoms, change in speech or vision, new numbness/weakness, other concern.

## 2017-08-10 NOTE — ED Triage Notes (Signed)
Per [facility], Pt is coming from home where family reported that he was unable to speak with family like normal. Last seen normal at 0700. Pt was able to speak with [employer] and then answered questions with EMS. Pt had negative neuro exam. Vitals per EMS: 269 CBG, 140/73, 88 HR,   During transport, pt noted some sadness and depression with EMS. Pt is a [facility] patient that goes three days a week. [facility] reported cognitive delayed and speech impairment. Pt tends to exhibit depression with TIA due to frustration. [facility] RN reported that pt is at his baseline.   Reported that he ate dinner last night with some shrimp and he vomited last night and this morning per family. PVCs noted on the 12 Lead EKG.   Pt denies headache, dizziness, or chest pain.

## 2017-08-10 NOTE — ED Notes (Signed)
MD Steinl at the bedside  

## 2017-08-10 NOTE — ED Notes (Signed)
Family arrived and helping pt get dressed. Pt to be taken to the car at this time.

## 2017-08-10 NOTE — ED Notes (Signed)
Pt's wife verbalized understanding of pt's discharge over the phone. Pt verbalized understanding, and son verbalized understanding. Unable to sign at this time .

## 2017-08-11 ENCOUNTER — Ambulatory Visit: Payer: Medicare Other | Admitting: Neurology

## 2017-08-15 ENCOUNTER — Other Ambulatory Visit: Payer: Self-pay | Admitting: Nurse Practitioner

## 2017-08-15 ENCOUNTER — Ambulatory Visit
Admission: RE | Admit: 2017-08-15 | Discharge: 2017-08-15 | Disposition: A | Discharge status: Home / Self Care | Payer: Medicare (Managed Care) | Source: Ambulatory Visit | Attending: Nurse Practitioner | Admitting: Nurse Practitioner

## 2017-08-15 DIAGNOSIS — R0602 Shortness of breath: Secondary | ICD-10-CM

## 2017-08-25 ENCOUNTER — Emergency Department (HOSPITAL_COMMUNITY): Payer: Medicare (Managed Care)

## 2017-08-25 ENCOUNTER — Inpatient Hospital Stay (HOSPITAL_COMMUNITY)
Admission: EM | Admit: 2017-08-25 | Discharge: 2017-08-28 | DRG: 064 | Disposition: A | Discharge status: Hospice | Payer: Medicare (Managed Care) | Attending: Oncology | Admitting: Oncology

## 2017-08-25 ENCOUNTER — Inpatient Hospital Stay (HOSPITAL_COMMUNITY): Payer: Medicare (Managed Care)

## 2017-08-25 ENCOUNTER — Encounter (HOSPITAL_COMMUNITY): Payer: Self-pay | Admitting: Pharmacy Technician

## 2017-08-25 DIAGNOSIS — I5042 Chronic combined systolic (congestive) and diastolic (congestive) heart failure: Secondary | ICD-10-CM | POA: Diagnosis present

## 2017-08-25 DIAGNOSIS — R402214 Coma scale, best verbal response, none, 24 hours or more after hospital admission: Secondary | ICD-10-CM | POA: Diagnosis not present

## 2017-08-25 DIAGNOSIS — R4701 Aphasia: Secondary | ICD-10-CM | POA: Diagnosis present

## 2017-08-25 DIAGNOSIS — Z7902 Long term (current) use of antithrombotics/antiplatelets: Secondary | ICD-10-CM

## 2017-08-25 DIAGNOSIS — I63512 Cerebral infarction due to unspecified occlusion or stenosis of left middle cerebral artery: Secondary | ICD-10-CM | POA: Diagnosis not present

## 2017-08-25 DIAGNOSIS — N186 End stage renal disease: Secondary | ICD-10-CM

## 2017-08-25 DIAGNOSIS — I63412 Cerebral infarction due to embolism of left middle cerebral artery: Secondary | ICD-10-CM | POA: Diagnosis not present

## 2017-08-25 DIAGNOSIS — Z8249 Family history of ischemic heart disease and other diseases of the circulatory system: Secondary | ICD-10-CM

## 2017-08-25 DIAGNOSIS — Z6841 Body Mass Index (BMI) 40.0 and over, adult: Secondary | ICD-10-CM

## 2017-08-25 DIAGNOSIS — G8191 Hemiplegia, unspecified affecting right dominant side: Secondary | ICD-10-CM | POA: Diagnosis present

## 2017-08-25 DIAGNOSIS — G40109 Localization-related (focal) (partial) symptomatic epilepsy and epileptic syndromes with simple partial seizures, not intractable, without status epilepticus: Secondary | ICD-10-CM | POA: Diagnosis not present

## 2017-08-25 DIAGNOSIS — Z794 Long term (current) use of insulin: Secondary | ICD-10-CM | POA: Diagnosis not present

## 2017-08-25 DIAGNOSIS — Z515 Encounter for palliative care: Secondary | ICD-10-CM | POA: Diagnosis not present

## 2017-08-25 DIAGNOSIS — R402314 Coma scale, best motor response, none, 24 hours or more after hospital admission: Secondary | ICD-10-CM | POA: Diagnosis not present

## 2017-08-25 DIAGNOSIS — I5030 Unspecified diastolic (congestive) heart failure: Secondary | ICD-10-CM | POA: Diagnosis not present

## 2017-08-25 DIAGNOSIS — E119 Type 2 diabetes mellitus without complications: Secondary | ICD-10-CM

## 2017-08-25 DIAGNOSIS — E872 Acidosis: Secondary | ICD-10-CM | POA: Diagnosis present

## 2017-08-25 DIAGNOSIS — Z833 Family history of diabetes mellitus: Secondary | ICD-10-CM

## 2017-08-25 DIAGNOSIS — I672 Cerebral atherosclerosis: Secondary | ICD-10-CM | POA: Diagnosis present

## 2017-08-25 DIAGNOSIS — G9340 Encephalopathy, unspecified: Secondary | ICD-10-CM | POA: Diagnosis not present

## 2017-08-25 DIAGNOSIS — I132 Hypertensive heart and chronic kidney disease with heart failure and with stage 5 chronic kidney disease, or end stage renal disease: Secondary | ICD-10-CM | POA: Diagnosis present

## 2017-08-25 DIAGNOSIS — Z66 Do not resuscitate: Secondary | ICD-10-CM | POA: Diagnosis not present

## 2017-08-25 DIAGNOSIS — G460 Middle cerebral artery syndrome: Secondary | ICD-10-CM | POA: Diagnosis present

## 2017-08-25 DIAGNOSIS — I639 Cerebral infarction, unspecified: Secondary | ICD-10-CM | POA: Diagnosis present

## 2017-08-25 DIAGNOSIS — E785 Hyperlipidemia, unspecified: Secondary | ICD-10-CM | POA: Diagnosis present

## 2017-08-25 DIAGNOSIS — R414 Neurologic neglect syndrome: Secondary | ICD-10-CM | POA: Diagnosis present

## 2017-08-25 DIAGNOSIS — J9601 Acute respiratory failure with hypoxia: Secondary | ICD-10-CM | POA: Diagnosis not present

## 2017-08-25 DIAGNOSIS — Z87891 Personal history of nicotine dependence: Secondary | ICD-10-CM | POA: Diagnosis not present

## 2017-08-25 DIAGNOSIS — I63312 Cerebral infarction due to thrombosis of left middle cerebral artery: Secondary | ICD-10-CM | POA: Diagnosis not present

## 2017-08-25 DIAGNOSIS — E1122 Type 2 diabetes mellitus with diabetic chronic kidney disease: Secondary | ICD-10-CM | POA: Diagnosis present

## 2017-08-25 DIAGNOSIS — I10 Essential (primary) hypertension: Secondary | ICD-10-CM | POA: Diagnosis not present

## 2017-08-25 DIAGNOSIS — H518 Other specified disorders of binocular movement: Secondary | ICD-10-CM | POA: Diagnosis not present

## 2017-08-25 DIAGNOSIS — R4702 Dysphasia: Secondary | ICD-10-CM | POA: Diagnosis present

## 2017-08-25 DIAGNOSIS — Z992 Dependence on renal dialysis: Secondary | ICD-10-CM

## 2017-08-25 DIAGNOSIS — G4733 Obstructive sleep apnea (adult) (pediatric): Secondary | ICD-10-CM | POA: Diagnosis present

## 2017-08-25 DIAGNOSIS — I6789 Other cerebrovascular disease: Secondary | ICD-10-CM | POA: Diagnosis present

## 2017-08-25 DIAGNOSIS — R29730 NIHSS score 30: Secondary | ICD-10-CM | POA: Diagnosis present

## 2017-08-25 DIAGNOSIS — R1310 Dysphagia, unspecified: Secondary | ICD-10-CM | POA: Diagnosis present

## 2017-08-25 DIAGNOSIS — R402134 Coma scale, eyes open, to sound, 24 hours or more after hospital admission: Secondary | ICD-10-CM | POA: Diagnosis not present

## 2017-08-25 DIAGNOSIS — Z7189 Other specified counseling: Secondary | ICD-10-CM | POA: Diagnosis not present

## 2017-08-25 DIAGNOSIS — I69320 Aphasia following cerebral infarction: Secondary | ICD-10-CM | POA: Diagnosis not present

## 2017-08-25 LAB — CBC WITH DIFFERENTIAL/PLATELET
BASOS PCT: 0 %
Basophils Absolute: 0 10*3/uL (ref 0.0–0.1)
Eosinophils Absolute: 0 10*3/uL (ref 0.0–0.7)
Eosinophils Relative: 0 %
HEMATOCRIT: 39.7 % (ref 39.0–52.0)
HEMOGLOBIN: 12.7 g/dL — AB (ref 13.0–17.0)
Lymphocytes Relative: 10 %
Lymphs Abs: 1.1 10*3/uL (ref 0.7–4.0)
MCH: 31.8 pg (ref 26.0–34.0)
MCHC: 32 g/dL (ref 30.0–36.0)
MCV: 99.3 fL (ref 78.0–100.0)
MONOS PCT: 7 %
Monocytes Absolute: 0.8 10*3/uL (ref 0.1–1.0)
NEUTROS ABS: 8.9 10*3/uL — AB (ref 1.7–7.7)
NEUTROS PCT: 83 %
Platelets: 372 10*3/uL (ref 150–400)
RBC: 4 MIL/uL — ABNORMAL LOW (ref 4.22–5.81)
RDW: 15.2 % (ref 11.5–15.5)
WBC: 10.8 10*3/uL — ABNORMAL HIGH (ref 4.0–10.5)

## 2017-08-25 LAB — COMPREHENSIVE METABOLIC PANEL
ALK PHOS: 104 U/L (ref 38–126)
ALT: 28 U/L (ref 17–63)
ANION GAP: 17 — AB (ref 5–15)
AST: 21 U/L (ref 15–41)
Albumin: 3.5 g/dL (ref 3.5–5.0)
BUN: 48 mg/dL — ABNORMAL HIGH (ref 6–20)
CALCIUM: 9.2 mg/dL (ref 8.9–10.3)
CHLORIDE: 96 mmol/L — AB (ref 101–111)
CO2: 28 mmol/L (ref 22–32)
Creatinine, Ser: 9.21 mg/dL — ABNORMAL HIGH (ref 0.61–1.24)
GFR calc non Af Amer: 5 mL/min — ABNORMAL LOW (ref >60)
GFR, EST AFRICAN AMERICAN: 6 mL/min — AB (ref >60)
Glucose, Bld: 159 mg/dL — ABNORMAL HIGH (ref 65–99)
POTASSIUM: 3.7 mmol/L (ref 3.5–5.1)
SODIUM: 141 mmol/L (ref 135–145)
Total Bilirubin: 0.7 mg/dL (ref 0.3–1.2)
Total Protein: 7.9 g/dL (ref 6.5–8.1)

## 2017-08-25 LAB — I-STAT CG4 LACTIC ACID, ED
LACTIC ACID, VENOUS: 1.65 mmol/L (ref 0.5–1.9)
Lactic Acid, Venous: 2.66 mmol/L (ref 0.5–1.9)

## 2017-08-25 LAB — URINALYSIS, ROUTINE W REFLEX MICROSCOPIC
BILIRUBIN URINE: NEGATIVE
Glucose, UA: NEGATIVE mg/dL
KETONES UR: NEGATIVE mg/dL
LEUKOCYTES UA: NEGATIVE
NITRITE: NEGATIVE
SPECIFIC GRAVITY, URINE: 1.015 (ref 1.005–1.030)
pH: 6 (ref 5.0–8.0)

## 2017-08-25 LAB — PROTIME-INR
INR: 1.11
PROTHROMBIN TIME: 14.2 s (ref 11.4–15.2)

## 2017-08-25 LAB — CBG MONITORING, ED: GLUCOSE-CAPILLARY: 164 mg/dL — AB (ref 65–99)

## 2017-08-25 LAB — APTT: APTT: 30 s (ref 24–36)

## 2017-08-25 MED ORDER — ACETAMINOPHEN 160 MG/5ML PO SOLN: 650.0000 mg | ORAL | Status: DC | PRN

## 2017-08-25 MED ORDER — IOPAMIDOL (ISOVUE-370) INJECTION 76%
INTRAVENOUS | Status: AC
  Filled 2017-08-25: qty 50

## 2017-08-25 MED ORDER — SODIUM CHLORIDE 0.9 % IV SOLN: INTRAVENOUS | Status: DC

## 2017-08-25 MED ORDER — ACETAMINOPHEN 650 MG RE SUPP: 650.0000 mg | RECTAL | Status: DC | PRN

## 2017-08-25 MED ORDER — SENNOSIDES-DOCUSATE SODIUM 8.6-50 MG PO TABS: 1.0000 | ORAL_TABLET | Freq: Every evening | ORAL | Status: DC | PRN

## 2017-08-25 MED ORDER — ACETAMINOPHEN 325 MG PO TABS: 650.0000 mg | ORAL_TABLET | ORAL | Status: DC | PRN

## 2017-08-25 MED ORDER — INSULIN ASPART 100 UNIT/ML ~~LOC~~ SOLN
0.0000 [IU] | Freq: Three times a day (TID) | SUBCUTANEOUS | Status: DC
  Administered 2017-08-26 (×2): 3 [IU] via SUBCUTANEOUS
  Filled 2017-08-25 (×2): qty 1

## 2017-08-25 MED ORDER — STROKE: EARLY STAGES OF RECOVERY BOOK
Freq: Once | Status: DC
  Filled 2017-08-25: qty 1

## 2017-08-25 MED ORDER — IOPAMIDOL (ISOVUE-370) INJECTION 76%: 50.0000 mL | Freq: Once | INTRAVENOUS | Status: DC | PRN

## 2017-08-25 NOTE — ED Provider Notes (Signed)
[facility] EMERGENCY DEPARTMENT Provider Note   CSN: [id number] Arrival date & time: 08/25/17  1752     History   Chief Complaint Chief Complaint  Patient presents with  . Altered Mental Status    HPI Blake Welchel Sr. is a 67 y.o. male.  HPI   Level 5 caveat due to altered mental status.  Blake Ina Sr. is a 67 y.o. male, with a history of anemia, CVA, CHF, DM, HTN, and ESRD on dialysis, presenting to the ED via EMS from [facility] for altered mental status beginning yesterday.  Patient is reportedly verbal and conversational as well as alert and oriented at baseline. Patient also reportedly sustained an unwitnessed fall yesterday, however,    Patient is on a Tuesday, Thursday, and Saturday dialysis schedule.  Past Medical History:  Diagnosis Date  . Anemia   . CVA (cerebral vascular accident) [facility]) 12/2013   admitted in July 2015 for acute right-sided infarct/notes 12/02/2014- 01/28/17-  uses walker   . ESRD (end stage renal disease) ([place])    Hemo TTHSat  . Hypercholesterolemia   . Hypertension   . Multiple wounds    on lower legs - sees [name] Pain at the [facility]  . OSA on CPAP    uses cpap, setting of 5, 01/28/17- does not use any longer  . 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 Stroke ([place]) 12/02/2014   expressive aphasia 01/28/17- sometimes  . Systolic CHF ([place])   . Type II diabetes mellitus ([place])    uncontrolled/notes 12/02/2014    Patient Active Problem List   Diagnosis Date Noted  . Stroke (cerebrum) ([place]) 04/15/2017  . Acute metabolic encephalopathy 77/41/2878  . Aphasia 04/15/2017  . ESRD on dialysis (TTS): 04/15/2017  . Hypokalemia 04/15/2017  . Word finding difficulty   . Pressure injury of skin 02/22/2017  . TIA (transient ischemic attack) 02/21/2017  . Expressive aphasia 09/06/2016  . Weakness following cerebrovascular accident (CVA) 09/06/2016  . Poor tolerance for ambulation  09/06/2016  . Chronic kidney disease (CKD), stage IV (severe) ([place]) 09/12/2015  . Chronic diastolic heart failure, NYHA class 2 ([place]) 08/08/2015  . Anemia, iron deficiency 06/04/2015  . Acute on chronic systolic (congestive) heart failure ([place]) 05/29/2015  . Systolic CHF ([place]) 67/67/2094  . Uncontrolled diabetes with stage 4 chronic kidney disease GFR 15-29 ([place]) 12/28/2014  . Type II diabetes mellitus with neurological manifestations ([place]) 12/12/2014  . Acute ischemic left MCA stroke ([place]) 12/02/2014  . Type 2 diabetes mellitus with complication ([place]) 70/96/2836  . Essential hypertension, benign 03/24/2014  . HLD (hyperlipidemia) 03/24/2014  . CVA (cerebral infarction) 01/13/2014  . Hypernatremia 07/03/2013  . Morbid obesity ([place]) 07/03/2013  . Venous stasis ulcers ([place]) 07/03/2013  . DM (diabetes mellitus) type 2, uncontrolled, with ketoacidosis ([place]) 07/03/2013  . 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 Sellar or suprasellar mass 06/22/2013  . Acute respiratory failure ([place]) 06/22/2013  . Seizure ([place]) 06/21/2013  . Altered mental status 06/21/2013  . Hypercarbia 06/21/2013  . OSA (obstructive sleep apnea) 05/25/2013    Past Surgical History:  Procedure Laterality Date  . A/V FISTULAGRAM N/A 01/17/2017   Procedure: A/V Fistulagram - Left arm;  Surgeon: Elam Dutch, MD;  Location: [facility] CV LAB;  Service: Cardiovascular;  Laterality: N/A;  . AV FISTULA PLACEMENT Left 09/27/2015   Procedure: RADIOCEPHALIC ARTERIOVENOUS (AV) FISTULA CREATION LEFT ARM;  Surgeon: Mal Misty, MD;  Location: [facility];  Service: Vascular;  Laterality: Left;  . AV FISTULA PLACEMENT Left 01/29/2017   Procedure: ARTERIOVENOUS (AV) FISTULA CREATION BRACHIOCEPHALIC;  Surgeon: Elam Dutch, MD;  Location: [facility] OR;  Service: Vascular;  Laterality: Left;  . AV FISTULA PLACEMENT Left 04/02/2017   Procedure: ARTERIOVENOUS (AV) FISTULA CREATION LEFT BRACHIOCEPHALIC AND LIGATION LEFT RADIOCEPHALIC;   Surgeon: Elam Dutch, MD;  Location: [facility];  Service: Vascular;  Laterality: Left;  . COLONOSCOPY  6-7 yrs ago  . COLONOSCOPY N/A 04/05/2013   Procedure: COLONOSCOPY;  Surgeon: Juanita Craver, MD;  Location: [facility];  Service: Endoscopy;  Laterality: N/A;  . LIGATION OF ARTERIOVENOUS  FISTULA Left 01/29/2017   Procedure: LIGATION OF RADIOCEPHALIC ARTERIOVENOUS  FISTULA;  Surgeon: Elam Dutch, MD;  [facility] OR;  Service: Vascular;  Laterality: Left;       Home Medications    Prior to Admission medications   Medication Sig Start Date End Date Taking? Authorizing Provider  acetaminophen (ACETAMINOPHEN 8 HOUR) 650 MG CR tablet Take 650 mg by mouth 3 (three) times daily.    Yes [provider]  acetaminophen (TYLENOL) 650 MG suppository Place 650 mg rectally once. For elevated temp   Yes [provider]  B Complex-C-Folic Acid (RENAL VITAMIN PO) Take 1 tablet by mouth daily.   Yes [provider]  bisacodyl (DULCOLAX) 10 MG suppository Place 10 mg rectally daily as needed (constipation not relieved by M.O.M.).   Yes [provider]  clopidogrel (PLAVIX) 75 MG tablet Take 1 tablet (75 mg total) by mouth daily. 04/18/17  Yes Burgess Estelle, MD  escitalopram (LEXAPRO) 10 MG tablet Take 1 tablet by mouth daily in evening Patient taking differently: Take 10 mg by mouth daily.  11/05/16  Yes Shawnee Knapp, MD  lanthanum (FOSRENOL) 1000 MG chewable tablet Chew 1,000 mg by mouth 3 (three) times daily.    Yes [provider]  Menthol, Topical Analgesic, (BIOFREEZE EX) Apply 1 application topically See admin instructions. Apply to bilateral knees four times daily as needed for pain   Yes [provider]  senna-docusate (SENOKOT-S) 8.6-50 MG tablet Take 1 tablet by mouth at bedtime as needed for moderate constipation. Patient taking differently: Take 2 tablets by mouth at bedtime as needed (constipation).  02/22/17  Yes Debbe Odea, MD    sodium phosphate (FLEET) 7-19 GM/118ML ENEM Place 1 enema rectally daily as needed (constipation not relieved by bisacodyl suppository).   Yes [provider]  atorvastatin (LIPITOR) 80 MG tablet Take 1 tablet (80 mg total) by mouth daily at 6 PM. Patient not taking: Reported on 06/26/2017 02/22/17   Debbe Odea, MD  cinacalcet (SENSIPAR) 30 MG tablet Take 1 tablet (30 mg total) by mouth Every Tuesday,Thursday,and Saturday with dialysis. 02/25/17   Debbe Odea, MD  clopidogrel (PLAVIX) 75 MG tablet TAKE 75 mg TABLET BY MOUTH EVERY DAY WITH BREAKFAST Patient not taking: Reported on 08/25/2017 02/22/17   Debbe Odea, MD  mineral oil-hydrophilic petrolatum (AQUAPHOR) ointment Apply topically daily as needed for dry skin. Patient not taking: Reported on 08/25/2017 04/18/17   Burgess Estelle, MD    Family History Family History  Problem Relation Age of Onset  . Diabetes Mother   . Hypertension Mother   . Diabetes Father   . Hypertension Father   . Asthma Son   . Diabetes Sister   . Diabetes Brother     Social History Social History   Tobacco Use  . Smoking status: Former Smoker    Packs/day: 0.25  Years: 2.00    Pack years: 0.50    Types: Cigarettes    Last attempt to quit: 06/17/1970    Years since quitting: 47.2  . Smokeless tobacco: Never Used  Substance Use Topics  . Alcohol use: No    Alcohol/week: 0.0 oz  . Drug use: Yes    Types: Marijuana    Comment: H/O marijuana use x 1 many years ago     Allergies   Patient has no known allergies.   Review of Systems Review of Systems  Unable to perform ROS: Mental status change     Physical Exam Updated Vital Signs BP (!) 202/86   Pulse 89   Temp 100.1 F (37.8 C) (Rectal)   Resp 17   SpO2 96%   Physical Exam  Constitutional: He appears well-developed and well-nourished. No distress.  HENT:  Head: Normocephalic and atraumatic.  Mouth/Throat: Oropharynx is clear and moist.  Eyes: Conjunctivae are normal.  Pupils are equal, round, and reactive to light.  Neck: Neck supple.  Cardiovascular: Normal rate, regular rhythm, normal heart sounds and intact distal pulses.  Pulmonary/Chest: Breath sounds normal. Tachypnea noted.  Abdominal: Soft. There is no tenderness. There is no guarding.  Morbidly obese abdomen  Genitourinary:  Genitourinary Comments: Dried stool noted perianally.  Musculoskeletal: He exhibits no edema.  Lymphadenopathy:    He has no cervical adenopathy.  Neurological: He is alert.  Patient will intermittently open his eyes and look at me, however, he will not follow commands and does not consistently open his eyes by command. He has noted to move his left arm and left leg, but no motor function noted in the right upper or lower extremity. He appears to be handling oral secretions without difficulty.  Skin: Skin is warm and dry. He is not diaphoretic.  Skin exam performed with no noted wounds.  Psychiatric: He has a normal mood and affect. His behavior is normal.  Nursing note and vitals reviewed.    ED Treatments / Results  Labs (all labs ordered are listed, but only abnormal results are displayed) Labs Reviewed  COMPREHENSIVE METABOLIC PANEL - Abnormal; Notable for the following components:      Result Value   Chloride 96 (*)    Glucose, Bld 159 (*)    BUN 48 (*)    Creatinine, Ser 9.21 (*)    GFR calc non Af Amer 5 (*)    GFR calc Af Amer 6 (*)    Anion gap 17 (*)    All other components within normal limits  CBC WITH DIFFERENTIAL/PLATELET - Abnormal; Notable for the following components:   WBC 10.8 (*)    RBC 4.00 (*)    Hemoglobin 12.7 (*)    Neutro Abs 8.9 (*)    All other components within normal limits  URINALYSIS, ROUTINE W REFLEX MICROSCOPIC - Abnormal; Notable for the following components:   Hgb urine dipstick SMALL (*)    Protein, ur >=300 (*)    Bacteria, UA RARE (*)    Squamous Epithelial / LPF 0-5 (*)    All other components within normal limits    CBG MONITORING, ED - Abnormal; Notable for the following components:   Glucose-Capillary 164 (*)    All other components within normal limits  CULTURE, BLOOD (ROUTINE X 2)  CULTURE, BLOOD (ROUTINE X 2)  APTT  PROTIME-INR  I-STAT CG4 LACTIC ACID, ED  I-STAT CG4 LACTIC ACID, ED    EKG  EKG Interpretation None  Radiology Ct Head Wo Contrast  Result Date: 08/25/2017 CLINICAL DATA:  Altered mental status. History of stroke and expressive aphasia, pituitary adenoma. EXAM: CT HEAD WITHOUT CONTRAST TECHNIQUE: Contiguous axial images were obtained from the base of the skull through the vertex without intravenous contrast. COMPARISON:  CT HEAD August 10, 2017 and MRI of the head April 15, 2017 FINDINGS: BRAIN: LEFT frontotemporal parietal and lateral LEFT occipital lobe confluent cytotoxic edema with effaced sulci in regional mass effect. Patchy hypodensity LEFT basal ganglia. Old bilateral basal ganglia infarcts with mild ex vacuo dilatation LEFT lateral ventricle. No hydrocephalus. No intraparenchymal hemorrhage. Patchy to confluent supratentorial white matter hypodensities. Basal cisterns patent. No abnormal extra-axial fluid collections. VASCULAR: Dense LEFT MCA. Dolichoectatic intracranial vessels with moderate to severe calcific atherosclerosis. SKULL: No skull fracture. No significant scalp soft tissue swelling. SINUSES/ORBITS: The mastoid air-cells and included paranasal sinuses are well-aerated.The included ocular globes and orbital contents are non-suspicious. OTHER: None. IMPRESSION: 1. Acute to early subacute large LEFT MCA and posterior watershed territory nonhemorrhagic infarct. Dense LEFT MCA compatible with thromboembolism. Regional mass effect without midline shift. 2. Old lacunar infarcts and moderate to severe chronic small vessel ischemic disease. 3. Critical Value/emergent results were called by telephone at the time of interpretation on 08/25/2017 at 7:10 pm to PA. Arlean Hopping , who verbally acknowledged these results. Electronically Signed   By: Elon Alas M.D.   On: 08/25/2017 19:11   Dg Chest Port 1 View  Result Date: 08/25/2017 CLINICAL DATA:  Sepsis EXAM: PORTABLE CHEST 1 VIEW COMPARISON:  08/15/2017 FINDINGS: Dialysis catheter is again seen. Cardiac shadow is mildly prominent but stable. Mild vascular congestion is again seen likely related to volume overload. No focal infiltrate is noted. No acute bony abnormality is seen. IMPRESSION: Mild vascular congestion. Electronically Signed   By: Inez Catalina M.D.   On: 08/25/2017 18:27    Procedures .Critical Care Performed by: Lorayne Bender, PA-C Authorized by: Lorayne Bender, PA-C   Critical care provider statement:    Critical care time (minutes):  35   Critical care time was exclusive of:  Separately billable procedures and treating other patients   Critical care was necessary to treat or prevent imminent or life-threatening deterioration of the following conditions:  CNS failure or compromise   Critical care was time spent personally by me on the following activities:  Discussions with consultants, examination of patient, obtaining history from patient or surrogate, review of old charts, re-evaluation of patient's condition, pulse oximetry, ordering and review of radiographic studies, ordering and review of laboratory studies and ordering and performing treatments and interventions   I assumed direction of critical care for this patient from another provider in my specialty: no     (including critical care time)  Medications Ordered in ED Medications - No data to display   Initial Impression / Assessment and Plan / ED Course  I have reviewed the triage vital signs and the nursing notes.  Pertinent labs & imaging results that were available during my care of the patient were reviewed by me and considered in my medical decision making (see chart for details).  Clinical Course as of Aug 25 2128  Mon  Aug 25, 2017  1824 Spoke with Conrad [place], RN at [facility]. States patient sustained an unwitnessed fall on 3/9 around 5 AM. She states the written note indicates patient's decreased responsiveness and cessation of verbal interaction was noted this morning, however, she also states the verbal report given to  her at the beginning of the shift was that the patient has had this decreased responsiveness since he fell. Usually alert and oriented to self only.  Noted to have increased temperature of 99.31F this morning (no time for this was noted). Given tylenol suppository at 11:32AM.  States patient reportedly went to dialysis on Saturday, 3/9, as scheduled.  [SJ]  Elsa with Dr. Lorraine Lax, neurologist. States the CVA seems to be complete and there is likely nothing else that can be done. He will review the CT. Recommends admission via hospitalist. Recommends no further treatment or imaging at this time.   [SJ]  34 Spoke with patient's son and POA, Marrion, [place]. To notify him of his father's condition. All questions were answered.  States he will try to come to the hospital tonight. Resuscitation wishes were reviewed.  He states he would like the patient to be full code and he approves any interventions at this time.  [SJ]  2127 Spoke with Kalman Shan, IM resident. States they will come evaluate and admit the patient.   [SJ]    Clinical Course User Index [SJ] Jamilya Sarrazin C, PA-C    Patient presents with decreased interaction and responsiveness.  Unreliable last seen normal time.  Evidence of left MCA infarct.  Admission via internal medicine teaching service with neurology consulting.   Most recent echo performed 04/17/17 notes EF 60-65%.   Findings and plan of care discussed with Jola Schmidt, MD. Dr. Venora Maples personally evaluated and examined this patient.  Vitals:   08/25/17 2000 08/25/17 2015 08/25/17 2045 08/25/17 2115  BP: (!) 200/90 (!) 198/110 (!) 199/80 (!) 202/86  Pulse: 92  89    Resp: (!) 34 20 (!) 21 17  Temp:      TempSrc:      SpO2: 92% 96%       Final Clinical Impressions(s) / ED Diagnoses   Final diagnoses:  Ischemic stroke [facility])    ED Discharge Orders    None       Layla Maw 08/25/17 2132    Jola Schmidt, MD 08/25/17 2245

## 2017-08-25 NOTE — ED Notes (Signed)
Patient transported to CT 

## 2017-08-25 NOTE — H&P (Signed)
Date: 08/25/2017               Patient Name:  Blake Bethel Sr. MRN: [id number]  DOB: 01-13-1951 Age / Sex: 67 y.o., male   PCP: System, Pcp Not In         Medical Service: Internal Medicine Teaching Service         Attending Physician: Dr. Jola Schmidt, MD    First Contact: Dr. Aggie Hacker Pager: [phone]  Second Contact: Dr. Danford Bad Pager: [phone]       After Hours (After 5p/  First Contact Pager: [phone]  weekends / holidays): Second Contact Pager: [phone]   Chief Complaint: AMS  History of Present Illness:  Blake Bell is a 67yo male P[facility] patient with PMH of CVA, CHF, DM, HTN, and ESRD on HD TuThSa who presents from [facility] with altered mental status. Patient unable to provide history, thus the following history was obtained from chart review.  Patient had an unwitnessed fall at the facility yesterday. He was noted to be more altered this morning and thus was brought to the ER. Last known normal is unknown. At baseline, he is reportedly verbal and conversational, as well as alert and oriented. BP on SNF paperwork suggests that his sBP is typically in the 130s-140s.  He has a history of multiple strokes, most recently in 03/2017, when he had an acute infarct in right parietal subcortical white matter. Work-up at that time was unremarkable except for microvascular ischemic changes. Stroke was thought to be secondary to small vessel disease as well as possibly a component of dialysis disequilibrium syndrome. He was started on atorvastatin 80mg  and plavix 75mg  daily at that time.  ED Course: - BP 175/83, HR 80, temp 100.1, RR 31, O2 96% on 2L [place] - WBC 10.8, Hb 12.7. Lactic acid 1.65. INR 1.11. Cr 9.21. UA with small Hgb, 300 protein, rare bacteria, 0-5 WBC. BCx ordered. - CXR showed mild vascular congestion. CT head showed acute to early subacute large left MCA and posterior watershed territory nonhemorrhagic infarct, with thromboembolism. - Neurology consulted by EDP.  Patient outside of window for tPA.  Meds:  Current Meds  Medication Sig  . acetaminophen (ACETAMINOPHEN 8 HOUR) 650 MG CR tablet Take 650 mg by mouth 3 (three) times daily.   Marland Kitchen acetaminophen (TYLENOL) 650 MG suppository Place 650 mg rectally once. For elevated temp  . B Complex-C-Folic Acid (RENAL VITAMIN PO) Take 1 tablet by mouth daily.  . bisacodyl (DULCOLAX) 10 MG suppository Place 10 mg rectally daily as needed (constipation not relieved by M.O.M.).  Marland Kitchen clopidogrel (PLAVIX) 75 MG tablet Take 1 tablet (75 mg total) by mouth daily.  Marland Kitchen escitalopram (LEXAPRO) 10 MG tablet Take 1 tablet by mouth daily in evening (Patient taking differently: Take 10 mg by mouth daily. )  . lanthanum (FOSRENOL) 1000 MG chewable tablet Chew 1,000 mg by mouth 3 (three) times daily.   . Menthol, Topical Analgesic, (BIOFREEZE EX) Apply 1 application topically See admin instructions. Apply to bilateral knees four times daily as needed for pain  . senna-docusate (SENOKOT-S) 8.6-50 MG tablet Take 1 tablet by mouth at bedtime as needed for moderate constipation. (Patient taking differently: Take 2 tablets by mouth at bedtime as needed (constipation). )  . sodium phosphate (FLEET) 7-19 GM/118ML ENEM Place 1 enema rectally daily as needed (constipation not relieved by bisacodyl suppository).   Allergies: Allergies as of 08/25/2017  . (No Known Allergies)   Past Medical History:  Diagnosis  Date  . Anemia   . CVA (cerebral vascular accident) [facility]) 12/2013   admitted in July 2015 for acute right-sided infarct/notes 12/02/2014- 01/28/17-  uses walker   . ESRD (end stage renal disease) ([place])    Hemo TTHSat  . Hypercholesterolemia   . Hypertension   . Multiple wounds    on lower legs - sees [name] [facility] at [facility]  . OSA on CPAP    uses cpap, setting of 5, 01/28/17- does not use any longer  . 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 Stroke ([place]) 12/02/2014   expressive aphasia 01/28/17-  sometimes  . Systolic CHF ([place])   . Type II diabetes mellitus ([place])    uncontrolled/notes 12/02/2014   Family History:  Family History  Problem Relation Age of Onset  . Diabetes Mother   . Hypertension Mother   . Diabetes Father   . Hypertension Father   . Asthma Son   . Diabetes Sister   . Diabetes Brother   A complete family history was unable to be obtained 2/2 AMS  Social History:  A complete social history was unable to be obtained 2/2 AMS  Review of Systems: A complete ROS was unable to be obtained secondary to AMS  Physical Exam: Blood pressure (!) 202/86, pulse 89, temperature 100.1 F (37.8 C), temperature source Rectal, resp. rate 17, SpO2 96 %.  GEN: Obese male lying in bed in NAD. Alert. Tracks my movements and follows some commands, but nonverbal and otherwise not interactive.  HENT: [place]/AT. EYES: PERRL. Sclera non-icteric. Conjunctiva clear. RESP: Clear to auscultation bilaterally. Grunting noises with each breath. No wheezes, rales, or rhonchi. No increased work of breathing, on 2L [place] CV: Normal rate and regular rhythm. No murmurs, gallops, or rubs. No carotid bruits. Trace LE edema. Right-sided temp catheter. ABD: Obese. Soft. Non-tender. Non-distended. Normoactive bowel sounds. EXT: Trace LEedema. Warm. 2+ DP pulses bilaterally. L brachiocephalic AV fistula with palpable thrill NEURO: Limited 2/2 AMS. CN II-XII grossly intact. Moves LUE and LLE spontaneously. Does not move RUE or RLE. Unable to complete full strength/sensation assessment, however does squeeze hand on the left side. Nonverbal. Follows some commands. PSYCH: Unable to assess 2/2 AMS.  Labs CBC Latest Ref Rng & Units 08/25/2017 08/10/2017 04/19/2017  WBC 4.0 - 10.5 K/uL 10.8(H) 12.4(H) 9.0  Hemoglobin 13.0 - 17.0 g/dL 12.7(L) 12.6(L) 12.9(L)  Hematocrit 39.0 - 52.0 % 39.7 38.8(L) 38.7(L)  Platelets 150 - 400 K/uL 372 381 321   CMP Latest Ref Rng & Units 08/25/2017 08/10/2017 04/19/2017  Glucose 65 -  99 mg/dL 159(H) 152(H) 115(H)  BUN 6 - 20 mg/dL 48(H) 32(H) 21(H)  Creatinine 0.61 - 1.24 mg/dL 9.21(H) 6.44(H) 3.91(H)  Sodium 135 - 145 mmol/L 141 138 131(L)  Potassium 3.5 - 5.1 mmol/L 3.7 3.6 3.0(L)  Chloride 101 - 111 mmol/L 96(L) 96(L) 93(L)  CO2 22 - 32 mmol/L 28 28 28   Calcium 8.9 - 10.3 mg/dL 9.2 9.1 8.4(L)  Total Protein 6.5 - 8.1 g/dL 7.9 - 8.0  Total Bilirubin 0.3 - 1.2 mg/dL 0.7 - 0.5  Alkaline Phos 38 - 126 U/L 104 - 99  AST 15 - 41 U/L 21 - 31  ALT 17 - 63 U/L 28 - 31   Lactic acid 1.65 -> 2.66 INR 1.11 BCx ordered and pending UA with small Hgb, 300 protein, negative nitrites, negative leukocytes, 0-5 RBC, 0-5 WBC, rare bacteria  EKG: personally reviewed my interpretation is NSR, no  ST or T wave changes, normal intervals  CXR: personally reviewed my interpretation is stable increased cardiac shadow, mild vascular congestion, no pleural effusion or evidence of consolidation  CT head 1. Acute to early subacute large LEFT MCA and posterior watershed territory nonhemorrhagic infarct. Dense LEFT MCA compatible with thromboembolism. Regional mass effect without midline shift. 2. Old lacunar infarcts and moderate to severe chronic small vessel ischemic disease. 3. Critical Value/emergent results were called by telephone at the time of interpretation on 08/25/2017 at 7:10 pm to PA. Arlean Hopping , who verbally acknowledged these results.  Assessment & Plan by Problem: Active Problems:   * No active hospital problems. *  Blake Bell is a 67yo male PACE patient with PMH of CVA, CHF, DM, HTN, and ESRD on HD TuThSa who presents from [facility] with altered mental status. Patient unable to provide history, but CT head obtained on admission demonstrated acute to early subacute large left MCA and posterior watershed territory infarct, as well as dense MCA compatible with thromboembolism. Outside of window for any intervention. He will be admitted for further stroke  work-up.  AMS Nonhemorrhagic CVA of left MCA Currently on plavix 75mg  daily d/t a history of stroke in 03/2017. Initial imaging suggests thromboembolic etiology. Patient is nonverbal and unable to provide history. Unclear baseline, however patient is reportedly verbal and interactive at baseline. Neurology has been consulted and recommends repeat CT head tomorrow morning to evaluate for changes, as well as holding ASA today due to mild petechial hemorrhage. - Neurology consulted; appreciate their recommendations - Admit to stepdown, low threshold for transfer to ICU - Telemetry - Allow for permissive HTN (systolic < 539 and diastolic < 767) - Hold ASA today per Neuro due to mild petechial hemorrhage - Continue home atorvastatin 80mg  daily when able to tolerate PO intake - Echocardiogram - CT-A head and neck - Repeat CT head tomorrow AM to evaluate midline shift and cerebral edema - Hemoglobin A1C, Lipid panel - NPO - SLP eval - PT/OT eval - Palliative care consult - May benefit to   Temperature 100.1 Lactic acidosis Patient altered and unable to provide a history. CXR without evidence of pneumonia. UA does not appear infected. Lactic acid initially normal, but increased to 2.66 on repeat. Unclear source for infection at this point. Will hold off on any antibiotics for now until we can find a source or if he clinically worsens. - F/u BCx 3/11 -> pending - Tylenol PRN for fever - Monitor fever curve - Trend lactic acid  HTN Not on any home anti-hypertensives. BP 178/95 - Allow for permissive HTN in setting of acute CVA  HFpEF (last echo in 04/2017: LVEF 60-65%, grade 1 DD) CXR with mild vascular congestion. Appears euvolemic on exam, but difficult to assess clinically. May benefit from lasix - TTE - Daily weights - I/Os  DM Most recent A1c 6.8 in 03/2017. BG 159 on admission. - CBG monitoring q4h - SSI-S q4h  ESRD on HD TuThSa Cr 9.21, BUN 48, K 3.7. No emergent need for  dialysis. - Consult Nephrology in AM for dialysis  Goals of care - Consult Palliative Medicine in AM for goals of care discussion  Diet: NPO VTE PPx: SCDs Code Status: Full code Dispo: Admit patient to Inpatient with expected length of stay greater than 2 midnights.  Signed: Colbert Ewing, MD 08/25/2017, 10:28 PM  Pager: Mamie Nick [phone]

## 2017-08-25 NOTE — ED Notes (Signed)
Patient back in room from CT.

## 2017-08-25 NOTE — Consult Note (Addendum)
Requesting Physician: Dr. Venora Maples    Chief Complaint: Aphasia  History obtained from: Chart     HPI:                                                                                                                                       Blake Colvin Sr. is an 67 y.o. male  patient with PMH of multiple CVAs, CHF, DM, HTN, and ESRD on HD who lives in nursing home brought to [facility] ER for altered mental status and not speaking since yesterday. The patient had unwitnessed fall yesterday at the facility, found today morning to be more altered and was brought to the emergency room. A stat CT head showed a established large left MCA acute ischemic stroke and neurology was consulted. .  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 CTA head and neck shows left M2 thrombus.He is on Plavix and atorvastatin. His baseline was verbal and conversational  Date last known well:? 3.10.19 Time last known well: unclear tPA Given: no,outside window NIHSS: 30 Baseline MRS 3     Past Medical History:  Diagnosis Date  . Anemia   . CVA (cerebral vascular accident) [facility]) 12/2013   admitted in July 2015 for acute right-sided infarct/notes 12/02/2014- 01/28/17-  uses walker   . ESRD (end stage renal disease) ([place])    Hemo TTHSat  . Hypercholesterolemia   . Hypertension   . Multiple wounds    on lower legs - sees [name] Pain at the [facility]  . OSA on CPAP    uses cpap, setting of 5, 01/28/17- does not use any longer  . 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 Stroke ([place]) 12/02/2014   expressive aphasia 01/28/17- sometimes  . Systolic CHF ([place])   . Type II diabetes mellitus ([place])    uncontrolled/notes 12/02/2014    Past Surgical History:  Procedure Laterality Date  . A/V FISTULAGRAM N/A 01/17/2017   Procedure: A/V Fistulagram - Left arm;  Surgeon: Elam Dutch, MD;  Location: [facility];  Service: Cardiovascular;  Laterality: N/A;  . AV FISTULA PLACEMENT Left 09/27/2015   Procedure: RADIOCEPHALIC ARTERIOVENOUS (AV) FISTULA CREATION LEFT ARM;  Surgeon: Mal Misty, MD;  Location: [facility];  Service: Vascular;  Laterality: Left;  . AV FISTULA PLACEMENT Left 01/29/2017   Procedure: ARTERIOVENOUS (AV) FISTULA CREATION BRACHIOCEPHALIC;  Surgeon: Elam Dutch, MD;  Location: [facility] OR;  Service: Vascular;  Laterality: Left;  . AV FISTULA PLACEMENT Left 04/02/2017   Procedure: ARTERIOVENOUS (AV) FISTULA CREATION LEFT BRACHIOCEPHALIC AND LIGATION LEFT RADIOCEPHALIC;  Surgeon: Elam Dutch, MD;  Location: [facility];  Service: Vascular;  Laterality: Left;  . COLONOSCOPY  6-7 yrs ago  . COLONOSCOPY N/A 04/05/2013   Procedure: COLONOSCOPY;  Surgeon: Mar Daring  Collene Mares, MD;  Location: [facility];  Service: Endoscopy;  Laterality: N/A;  . LIGATION OF ARTERIOVENOUS  FISTULA Left 01/29/2017   Procedure: LIGATION OF RADIOCEPHALIC ARTERIOVENOUS  FISTULA;  Surgeon: Elam Dutch, MD;  Location: [facility] OR;  Service: Vascular;  Laterality: Left;    Family History  Problem Relation Age of Onset  . Diabetes Mother   . Hypertension Mother   . Diabetes Father   . Hypertension Father   . Asthma Son   . Diabetes Sister   . Diabetes Brother    Social History:  reports that he quit smoking about 47 years ago. His smoking use included cigarettes. He has a 0.50 pack-year smoking history. he has never used smokeless tobacco. He reports that he uses drugs. Drug: Marijuana. He reports that he does not drink alcohol.  Allergies: No Known Allergies  Medications:                                                                                                                        I reviewed home medications   ROS:                                                                                                                                     14 systems reviewed and  negative except above    Examination:                                                                                                      General: Appears well-developed and well-nourished.  Psych: Affect appropriate to situation Eyes: No scleral injection HENT: No OP obstrucion Head: Normocephalic.  Cardiovascular: Normal rate and regular rhythm.  Respiratory: Effort normal and breath sounds normal to anterior ascultation GI: Soft.  No distension. There is no tenderness.  Skin: WDI   Neurological Examination Mental Status: Somnolent, globally aphasic and mute. Cranial Nerves: II: Blinks to threat on left side,  III,IV, VI: Forced gaze deviation to the left side V,VII:  Right facial droop  VIII: hearing normal bilaterally IX,X: uvula rises symmetrically XI: bilateral shoulder shrug XII: midline tongue extension Motor: Right : Upper extremity   0/5    Left:     Upper extremity   3/5  Lower extremity   0/5     Lower extremity   3/5 Tone and bulk:normal tone throughout; no atrophy noted Sensory: Neglect to  pressure, light touch and noxious on the right side Deep Tendon Reflexes: 2+ and symmetric throughout Plantars: Right: downgoing   Left: downgoing Cerebellar: normal finger-to-nose, normal rapid alternating movements and normal heel-to-shin test Gait: normal gait and station     Lab Results: Basic Metabolic Panel: Recent Labs  Lab 08/25/17 1854  NA 141  K 3.7  CL 96*  CO2 28  GLUCOSE 159*  BUN 48*  CREATININE 9.21*  CALCIUM 9.2    CBC: Recent Labs  Lab 08/25/17 1854  WBC 10.8*  NEUTROABS 8.9*  HGB 12.7*  HCT 39.7  MCV 99.3  PLT 372    Coagulation Studies: Recent Labs    08/25/17 1854  LABPROT 14.2  INR 1.11    Imaging: Ct Angio Head W Or Wo Contrast  Result Date: 08/26/2017 CLINICAL DATA:  66 y/o M; acute to early subacute left MCA and posterior watershed infarct. Dense left MCA. EXAM: CT ANGIOGRAPHY HEAD AND NECK TECHNIQUE:  Multidetector CT imaging of the head and neck was performed using the standard protocol during bolus administration of intravenous contrast. Multiplanar CT image reconstructions and MIPs were obtained to evaluate the vascular anatomy. Carotid stenosis measurements (when applicable) are obtained utilizing NASCET criteria, using the distal internal carotid diameter as the denominator. CONTRAST:  50 cc Isovue 370 COMPARISON:  02/20/2017 CT angiogram head neck. 04/15/2017 MRA head. 08/25/2017 CT head. FINDINGS: CTA NECK FINDINGS Aortic arch: Standard branching. Imaged portion shows no evidence of aneurysm or dissection. No significant stenosis of the major arch vessel origins. Mild calcific atherosclerosis. Right carotid system: No evidence of dissection, stenosis (50% or greater) or occlusion. Mild calcific atherosclerosis of carotid bifurcation. Retropharyngeal course of proximal ICA. Left carotid system: No evidence of dissection, stenosis (50% or greater) or occlusion. Retropharyngeal course of proximal ICA and moderate tortuosity. Vertebral arteries: Right dominant. No evidence of dissection, stenosis (50% or greater) or occlusion. Skeleton: Moderate cervical spondylosis with discogenic degenerative changes greatest from the C3 through C6 levels. No high-grade bony canal stenosis. Other neck: Negative. Upper chest: Negative. Review of the MIP images confirms the above findings CTA HEAD FINDINGS Anterior circulation: Left M2 inferior division occlusion with poor downstream collateralization. Otherwise patent anterior circulation without aneurysm, high-grade stenosis, or vascular malformation. Posterior circulation: No significant stenosis, proximal occlusion, aneurysm, or vascular malformation. Venous sinuses: As permitted by contrast timing, patent. Anatomic variants: Large right posterior communicating artery. Small anterior communicating artery. No left posterior communicating artery identified, likely  hypoplastic or absent. Delayed phase: No abnormal intracranial enhancement. Review of the MIP images confirms the above findings IMPRESSION: 1. Left M2 inferior division occlusion and poor downstream collateralization corresponding to area of recent infarction. 2. Patent carotid and vertebral arteries. No dissection, aneurysm, or significant stenosis by NASCET criteria is identified. 3. No additional large vessel occlusion in the anterior or posterior intracranial circulation identified. These results were called by telephone at the time of interpretation on 08/26/2017 at 12:13 am to Dr. Lorraine Lax, who verbally acknowledged these results. Electronically Signed   By: Kristine Garbe M.D.   On: 08/26/2017 00:22   Ct Head Wo  Contrast  Result Date: 08/25/2017 CLINICAL DATA:  Altered mental status. History of stroke and expressive aphasia, pituitary adenoma. EXAM: CT HEAD WITHOUT CONTRAST TECHNIQUE: Contiguous axial images were obtained from the base of the skull through the vertex without intravenous contrast. COMPARISON:  CT HEAD August 10, 2017 and MRI of the head April 15, 2017 FINDINGS: BRAIN: LEFT frontotemporal parietal and lateral LEFT occipital lobe confluent cytotoxic edema with effaced sulci in regional mass effect. Patchy hypodensity LEFT basal ganglia. Old bilateral basal ganglia infarcts with mild ex vacuo dilatation LEFT lateral ventricle. No hydrocephalus. No intraparenchymal hemorrhage. Patchy to confluent supratentorial white matter hypodensities. Basal cisterns patent. No abnormal extra-axial fluid collections. VASCULAR: Dense LEFT MCA. Dolichoectatic intracranial vessels with moderate to severe calcific atherosclerosis. SKULL: No skull fracture. No significant scalp soft tissue swelling. SINUSES/ORBITS: The mastoid air-cells and included paranasal sinuses are well-aerated.The included ocular globes and orbital contents are non-suspicious. OTHER: None. IMPRESSION: 1. Acute to early subacute  large LEFT MCA and posterior watershed territory nonhemorrhagic infarct. Dense LEFT MCA compatible with thromboembolism. Regional mass effect without midline shift. 2. Old lacunar infarcts and moderate to severe chronic small vessel ischemic disease. 3. Critical Value/emergent results were called by telephone at the time of interpretation on 08/25/2017 at 7:10 pm to PA. Arlean Hopping , who verbally acknowledged these results. Electronically Signed   By: Elon Alas M.D.   On: 08/25/2017 19:11   Ct Angio Neck W Or Wo Contrast  Result Date: 08/26/2017 CLINICAL DATA:  67 y/o M; acute to early subacute left MCA and posterior watershed infarct. Dense left MCA. EXAM: CT ANGIOGRAPHY HEAD AND NECK TECHNIQUE: Multidetector CT imaging of the head and neck was performed using the standard protocol during bolus administration of intravenous contrast. Multiplanar CT image reconstructions and MIPs were obtained to evaluate the vascular anatomy. Carotid stenosis measurements (when applicable) are obtained utilizing NASCET criteria, using the distal internal carotid diameter as the denominator. CONTRAST:  50 cc Isovue 370 COMPARISON:  02/20/2017 CT angiogram head neck. 04/15/2017 MRA head. 08/25/2017 CT head. FINDINGS: CTA NECK FINDINGS Aortic arch: Standard branching. Imaged portion shows no evidence of aneurysm or dissection. No significant stenosis of the major arch vessel origins. Mild calcific atherosclerosis. Right carotid system: No evidence of dissection, stenosis (50% or greater) or occlusion. Mild calcific atherosclerosis of carotid bifurcation. Retropharyngeal course of proximal ICA. Left carotid system: No evidence of dissection, stenosis (50% or greater) or occlusion. Retropharyngeal course of proximal ICA and moderate tortuosity. Vertebral arteries: Right dominant. No evidence of dissection, stenosis (50% or greater) or occlusion. Skeleton: Moderate cervical spondylosis with discogenic degenerative changes  greatest from the C3 through C6 levels. No high-grade bony canal stenosis. Other neck: Negative. Upper chest: Negative. Review of the MIP images confirms the above findings CTA HEAD FINDINGS Anterior circulation: Left M2 inferior division occlusion with poor downstream collateralization. Otherwise patent anterior circulation without aneurysm, high-grade stenosis, or vascular malformation. Posterior circulation: No significant stenosis, proximal occlusion, aneurysm, or vascular malformation. Venous sinuses: As permitted by contrast timing, patent. Anatomic variants: Large right posterior communicating artery. Small anterior communicating artery. No left posterior communicating artery identified, likely hypoplastic or absent. Delayed phase: No abnormal intracranial enhancement. Review of the MIP images confirms the above findings IMPRESSION: 1. Left M2 inferior division occlusion and poor downstream collateralization corresponding to area of recent infarction. 2. Patent carotid and vertebral arteries. No dissection, aneurysm, or significant stenosis by NASCET criteria is identified. 3. No additional large vessel occlusion in the anterior or posterior  intracranial circulation identified. These results were called by telephone at the time of interpretation on 08/26/2017 at 12:13 am to Dr. Lorraine Lax, who verbally acknowledged these results. Electronically Signed   By: Kristine Garbe M.D.   On: 08/26/2017 00:22   Dg Chest Port 1 View  Result Date: 08/25/2017 CLINICAL DATA:  Sepsis EXAM: PORTABLE CHEST 1 VIEW COMPARISON:  08/15/2017 FINDINGS: Dialysis catheter is again seen. Cardiac shadow is mildly prominent but stable. Mild vascular congestion is again seen likely related to volume overload. No focal infiltrate is noted. No acute bony abnormality is seen. IMPRESSION: Mild vascular congestion. Electronically Signed   By: Inez Catalina M.D.   On: 08/25/2017 18:27     ASSESSMENT AND PLAN  67 y.o. male  patient  with PMH of multiple CVAs, CHF, DM, HTN, and ESRD on HD presents with large left MCA stroke resulting in global aphasia and right hemiplegia with neglect. CTA head shows Left M2 inferior division occlusion and poor downstream collateralization corresponding to area of recent infarction. Outside the window for any intervention including TPA and thrombectomy.   Acute large Left MCA stroke  - admit to step down, low threshold to ICU transfer  - outside window for any intervention - Hold ASA today, mild petechial hemorrhage - repeat CT head tomorrow morning to watch for midline shift,cerebral edema - permissive HTN - echo, CTA head and neck - monitor for respiratory distress aspiration - NPO, will likely need feeding tube  - palliative consult for goals of care discussion  Kean Gautreau [facility] Pager Number [phone]

## 2017-08-26 ENCOUNTER — Other Ambulatory Visit (HOSPITAL_COMMUNITY): Payer: Medicare (Managed Care)

## 2017-08-26 ENCOUNTER — Inpatient Hospital Stay (HOSPITAL_COMMUNITY): Payer: Medicare (Managed Care)

## 2017-08-26 DIAGNOSIS — E1122 Type 2 diabetes mellitus with diabetic chronic kidney disease: Secondary | ICD-10-CM

## 2017-08-26 DIAGNOSIS — G8191 Hemiplegia, unspecified affecting right dominant side: Secondary | ICD-10-CM

## 2017-08-26 DIAGNOSIS — E119 Type 2 diabetes mellitus without complications: Secondary | ICD-10-CM

## 2017-08-26 DIAGNOSIS — I132 Hypertensive heart and chronic kidney disease with heart failure and with stage 5 chronic kidney disease, or end stage renal disease: Secondary | ICD-10-CM

## 2017-08-26 DIAGNOSIS — I63512 Cerebral infarction due to unspecified occlusion or stenosis of left middle cerebral artery: Secondary | ICD-10-CM

## 2017-08-26 DIAGNOSIS — I10 Essential (primary) hypertension: Secondary | ICD-10-CM

## 2017-08-26 DIAGNOSIS — Z992 Dependence on renal dialysis: Secondary | ICD-10-CM

## 2017-08-26 DIAGNOSIS — I639 Cerebral infarction, unspecified: Secondary | ICD-10-CM

## 2017-08-26 DIAGNOSIS — G9340 Encephalopathy, unspecified: Secondary | ICD-10-CM

## 2017-08-26 DIAGNOSIS — Z7189 Other specified counseling: Secondary | ICD-10-CM

## 2017-08-26 DIAGNOSIS — I63412 Cerebral infarction due to embolism of left middle cerebral artery: Secondary | ICD-10-CM

## 2017-08-26 DIAGNOSIS — I5030 Unspecified diastolic (congestive) heart failure: Secondary | ICD-10-CM

## 2017-08-26 DIAGNOSIS — J9601 Acute respiratory failure with hypoxia: Secondary | ICD-10-CM

## 2017-08-26 DIAGNOSIS — Z794 Long term (current) use of insulin: Secondary | ICD-10-CM

## 2017-08-26 DIAGNOSIS — N186 End stage renal disease: Secondary | ICD-10-CM

## 2017-08-26 DIAGNOSIS — I69320 Aphasia following cerebral infarction: Secondary | ICD-10-CM

## 2017-08-26 LAB — HEMOGLOBIN A1C
HEMOGLOBIN A1C: 6.6 % — AB (ref 4.8–5.6)
MEAN PLASMA GLUCOSE: 142.72 mg/dL

## 2017-08-26 LAB — LIPID PANEL
Cholesterol: 170 mg/dL (ref 0–200)
HDL: 59 mg/dL (ref >40)
LDL Cholesterol: 94 mg/dL (ref 0–99)
Total CHOL/HDL Ratio: 2.9 RATIO
Triglycerides: 83 mg/dL (ref <150)
VLDL: 17 mg/dL (ref 0–40)

## 2017-08-26 LAB — CBG MONITORING, ED
GLUCOSE-CAPILLARY: 141 mg/dL — AB (ref 65–99)
GLUCOSE-CAPILLARY: 149 mg/dL — AB (ref 65–99)
GLUCOSE-CAPILLARY: 133 mg/dL — AB (ref 65–99)
Glucose-Capillary: 141 mg/dL — ABNORMAL HIGH (ref 65–99)

## 2017-08-26 LAB — LACTIC ACID, PLASMA
LACTIC ACID, VENOUS: 1.7 mmol/L (ref 0.5–1.9)
Lactic Acid, Venous: 1.2 mmol/L (ref 0.5–1.9)

## 2017-08-26 MED ORDER — ACETAMINOPHEN 325 MG PO TABS: 650.0000 mg | ORAL_TABLET | Freq: Four times a day (QID) | ORAL | Status: DC | PRN

## 2017-08-26 MED ORDER — ASPIRIN EC 325 MG PO TBEC: 325.0000 mg | DELAYED_RELEASE_TABLET | Freq: Every day | ORAL | Status: DC

## 2017-08-26 MED ORDER — GLYCOPYRROLATE 0.2 MG/ML IJ SOLN: 0.2000 mg | INTRAMUSCULAR | Status: DC | PRN

## 2017-08-26 MED ORDER — ASPIRIN 300 MG RE SUPP: 300.0000 mg | Freq: Every day | RECTAL | Status: DC

## 2017-08-26 MED ORDER — ACETAMINOPHEN 650 MG RE SUPP: 650.0000 mg | Freq: Four times a day (QID) | RECTAL | Status: DC | PRN

## 2017-08-26 MED ORDER — POLYVINYL ALCOHOL 1.4 % OP SOLN
1.0000 [drp] | Freq: Four times a day (QID) | OPHTHALMIC | Status: DC | PRN
  Filled 2017-08-26: qty 15

## 2017-08-26 MED ORDER — ORAL CARE MOUTH RINSE
15.0000 mL | Freq: Two times a day (BID) | OROMUCOSAL | Status: DC
  Administered 2017-08-27 – 2017-08-28 (×3): 15 mL via OROMUCOSAL

## 2017-08-26 MED ORDER — ONDANSETRON 4 MG PO TBDP: 4.0000 mg | ORAL_TABLET | Freq: Four times a day (QID) | ORAL | Status: DC | PRN

## 2017-08-26 MED ORDER — CHLORHEXIDINE GLUCONATE 0.12 % MT SOLN
15.0000 mL | Freq: Two times a day (BID) | OROMUCOSAL | Status: DC
  Administered 2017-08-26 – 2017-08-28 (×4): 15 mL via OROMUCOSAL
  Filled 2017-08-26 (×4): qty 15

## 2017-08-26 MED ORDER — GLYCOPYRROLATE 1 MG PO TABS
1.0000 mg | ORAL_TABLET | ORAL | Status: DC | PRN
  Filled 2017-08-26: qty 1

## 2017-08-26 MED ORDER — ONDANSETRON HCL 4 MG/2ML IJ SOLN: 4.0000 mg | Freq: Four times a day (QID) | INTRAMUSCULAR | Status: DC | PRN

## 2017-08-26 MED ORDER — BIOTENE DRY MOUTH MT LIQD: 15.0000 mL | OROMUCOSAL | Status: DC | PRN

## 2017-08-26 NOTE — Progress Notes (Signed)
Subjective:  Patient seen and examined. He was not alert or oriented, and unable to participate. Took quite some time and stimulation to wake the patient. He did not retract to painful stimuli.   I tried calling the patient's son, but was unable to reach him. I was able to get in touch with Dr. Jimmye Norman at [facility]. She states that Mr. Kohlmann has expressive aphasia at baseline due to multiple stroke history, but will communicate and carry a conversation. The patient is coming from [place]. Per Dr. Jimmye Norman he was difficult to arouse yesterday morning. She states that Mr. Barnick was adamant about to not be brought to the ED, so the decision was made to wait and see if his mental status improved throughout the day, which it did not. Because of lack of improvement he was brought to the ED. Per Dr. Jimmye Norman the patient is chronically ill and has not been tolerating HD well. She had started having conversations with the patient's family (son and wife) about Mr. Pizzo poor health prognosis even prior to this event.   Objective:  Vital signs in last 24 hours: Vitals:   08/26/17 0954 08/26/17 1000 08/26/17 1030 08/26/17 1100  BP: (!) 161/90 (!) 143/91 (!) 150/91 139/90  Pulse: 83 79 83 81  Resp: 20 17 20 18   Temp:      TempSrc:      SpO2: 96% 95% 96% 97%   General: Somnolent, difficult to arouse, Laying in bed, NAD HEENT: [place]/AT, does not tract and had leftward gaze deviation, no scleral icterus, PERRL Cardiac: RRR, No R/M/G appreciated Pulm: normal effort, CTAB Abd: soft, non tender, non distended, BS normal Ext: extremities well perfused, no peripheral edema Neuro: Not alert or oriented; GCS 5; no gross cranial nerve deficits; corneal reflex intact; He does not retract to painful stimuli; right hemiplegia of upper and lower extremity; spontaneous movements of his left upper extremity; symmetric 2+ brachial DTR, symmetric 0 patellar reflexes, negative babinski bilaterally      Assessment/Plan:  Active Problems:   CVA (cerebral vascular accident) ([place])   Nonhemorrhagic CVA of left MCA CT head revealed left MCA and posterior watershed territory nonhemorrhagic infarct. Dense left MCA compatible with thromboembolism. Given multiple co-morbidities and severity of the infarct patient has a poor prognosis. Initially consulted PCCM due to concern airway protection. I contacted Dr. Jimmye Norman with [facility]. She is helping facilitate discussions of [place] with family. After discussions with Dr. Jimmye Norman and family have rescinded PCCM consult at this time.  - Repeat CT head pending to evaluate for cerebral edema - Neurology consulted; appreciate their recommendations - Allow for permissive HTN (systolic <785 and diastolic <885) - Hold ASA today per Neuro due to mild petechial hemorrhage - Echocardiogram pending  - NPO -PT/OT - holding for now as patient unable to participate  HTN BP 178/95 - Allow for permissive HTN in setting of acute CVA  HFpEF Last echo in 04/2017: LVEF 60-65%, grade 1 DD. CXR with mild vascular congestion. Appears euvolemic on exam, but difficult to assess clinically.  - TTE - Daily weights - I/Os  DM Most recent A1c 6.8 in 03/2017.  - CBG monitoring q4h - SSI-S q4h  ESRD on HD TuThSa -Per Dr. Jimmye Norman after discussion with family will hold HD for today, will defer nephrology consult for now  Goals of care -Will continue to facilitate conversations with the help of Dr. Jimmye Norman. I greatly appreciate her help. Plan may be to have  patient return to [place].   Diet: NPO VTE PPx: SCDs Code Status: Full code  Dispo: Admit patient to Inpatient with expected length of stay greater than 2 midnights.    Dispo: Anticipated discharge in approximately 1-2 day(s).   Melanee Spry, MD 08/26/2017, 11:32 AM Pager: [phone]

## 2017-08-26 NOTE — Consult Note (Signed)
PULMONARY / CRITICAL CARE MEDICINE   Name: Blake Maffei Sr. MRN: [id number] DOB: Jun 09, 1951    ADMISSION DATE:  08/25/2017 CONSULTATION DATE:  08/26/2017  REFERRING MD:  Beryle Beams  CHIEF COMPLAINT:  CVA  HISTORY OF PRESENT ILLNESS:   67 year old male with past medical hisotry of ESRD on TTS HD, multiple CVA, CHF, anemia, DM, HTN presenting 3/10 from [facility] with altered mental status and aphasia after unwitnessed fall on 3/9.      At baseline, patient is alert, oriented to self, and conversational.  On arrival to ER, CT with evidence of a large left MCA infarct.  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  Neurology consulted.  LSW time is not clear and therefore was not a candidate for TPA or interventional procedure.  CTA head and neck showing left M2 thrombus.  Family requesting full code and medical support.  He was admitted to IMTS with neurology consulting.  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  PAST SURGICAL HISTORY: He  has a past surgical history that includes Colonoscopy (6-7 yrs ago); Colonoscopy (N/A, 04/05/2013); AV fistula placement (Left, 09/27/2015); A/V Fistulagram (N/A, 01/17/2017); AV fistula placement (Left, 01/29/2017); Ligation of arteriovenous  fistula (Left, 01/29/2017); and AV fistula placement (Left, 04/02/2017).  No Known Allergies  No current facility-administered medications on file prior to encounter.    Current Outpatient Medications on File Prior to Encounter  Medication Sig  . acetaminophen (ACETAMINOPHEN 8 HOUR) 650 MG CR tablet Take 650 mg by mouth 3 (three) times daily.   Marland Kitchen acetaminophen  (TYLENOL) 650 MG suppository Place 650 mg rectally once. For elevated temp  . B Complex-C-Folic Acid (RENAL VITAMIN PO) Take 1 tablet by mouth daily.  . bisacodyl (DULCOLAX) 10 MG suppository Place 10 mg rectally daily as needed (constipation not relieved by M.O.M.).  Marland Kitchen clopidogrel (PLAVIX) 75 MG tablet Take 1 tablet (75 mg total) by mouth daily.  Marland Kitchen escitalopram (LEXAPRO) 10 MG tablet Take 1 tablet by mouth daily in evening (Patient taking differently: Take 10 mg by mouth daily. )  . lanthanum (FOSRENOL) 1000 MG chewable tablet Chew 1,000 mg by mouth 3 (three) times daily.   . Menthol, Topical Analgesic, (BIOFREEZE EX) Apply 1 application topically See admin instructions. Apply to bilateral knees four times daily as needed for pain  . senna-docusate (SENOKOT-S) 8.6-50 MG tablet Take 1 tablet by mouth at bedtime as needed for moderate constipation. (Patient taking differently: Take 2 tablets by mouth at bedtime as needed (constipation). )  . sodium phosphate (FLEET) 7-19 GM/118ML ENEM Place 1 enema rectally daily as needed (constipation not relieved by bisacodyl suppository).  Marland Kitchen atorvastatin (LIPITOR) 80 MG tablet Take 1 tablet (80 mg total) by mouth daily at 6 PM. (Patient not taking: Reported on 06/26/2017)  . cinacalcet (SENSIPAR) 30 MG tablet Take 1 tablet (30 mg total) by mouth Every Tuesday,Thursday,and Saturday with dialysis.  Marland Kitchen clopidogrel (PLAVIX) 75 MG tablet TAKE 75 mg TABLET BY MOUTH EVERY DAY WITH BREAKFAST (Patient not taking: Reported on 08/25/2017)  . mineral oil-hydrophilic petrolatum (AQUAPHOR) ointment Apply topically daily as needed for dry skin. (Patient not taking: Reported on 08/25/2017)    FAMILY HISTORY:  His  indicated that his mother is deceased. He indicated that his father is deceased. He indicated that the status of his sister is unknown. He indicated that the status of his brother is unknown. He indicated that his daughter is alive. He indicated that both of his sons are  alive.   SOCIAL HISTORY: He  reports that he quit smoking about 47 years ago. His smoking use included cigarettes. He has a 0.50 pack-year smoking history. he has never used smokeless tobacco. He reports that he uses drugs. Drug: Marijuana. He reports that he does not drink alcohol.  REVIEW OF SYSTEMS:   Unattainable, patient is unresponsive  SUBJECTIVE:  Unresponsive  VITAL SIGNS: BP 139/90   Pulse 81   Temp 100.1 F (37.8 C) (Rectal)   Resp 18   SpO2 97%   HEMODYNAMICS:    VENTILATOR SETTINGS: FiO2 (%):  [98 %] 98 %  INTAKE / OUTPUT: No intake/output data recorded.  PHYSICAL EXAMINATION: General:  Chronically ill appearing male, unresponsive and with no gag reflex Neuro:  Unresponsive, not withdrawing to pain, no gag reflex HEENT:  [place]/AT, pupils are sluggish and patient is gurgling Cardiovascular:  RRR, Nl S1/S2 and -M/R/G Lungs:  Transmitted upper airway sounds audible in all lung fields Abdomen:  Soft, NT, ND and +BS, obese Musculoskeletal:  1+ edema and -tenderness Skin:  Intact  LABS:  BMET Recent Labs  Lab 08/25/17 1854  NA 141  K 3.7  CL 96*  CO2 28  BUN 48*  CREATININE 9.21*  GLUCOSE 159*    Electrolytes Recent Labs  Lab 08/25/17 1854  CALCIUM 9.2    CBC Recent Labs  Lab 08/25/17 1854  WBC 10.8*  HGB 12.7*  HCT 39.7  PLT 372    Coag's Recent Labs  Lab 08/25/17 1854  APTT 30  INR 1.11    Sepsis Markers Recent Labs  Lab 08/25/17 2144 08/26/17 0147 08/26/17 0408  LATICACIDVEN 2.66* 1.2 1.7    ABG No results for input(s): PHART, PCO2ART, PO2ART in the last 168 hours.  Liver Enzymes Recent Labs  Lab 08/25/17 1854  AST 21  ALT 28  ALKPHOS 104  BILITOT 0.7  ALBUMIN 3.5    Cardiac Enzymes No results for input(s): TROPONINI, PROBNP in the last 168 hours.  Glucose Recent Labs  Lab 08/25/17 1756 08/26/17 0734 08/26/17 0938  GLUCAP 164* 141* 149*    Imaging Ct Angio Head W Or Wo Contrast  Result Date:  08/26/2017 CLINICAL DATA:  67 y/o M; acute to early subacute left MCA and posterior watershed infarct. Dense left MCA. EXAM: CT ANGIOGRAPHY HEAD AND NECK TECHNIQUE: Multidetector CT imaging of the head and neck was performed using the standard protocol during bolus administration of intravenous contrast. Multiplanar CT image reconstructions and MIPs were obtained to evaluate the vascular anatomy. Carotid stenosis measurements (when applicable) are obtained utilizing NASCET criteria, using the distal internal carotid diameter as the denominator. CONTRAST:  50 cc Isovue 370 COMPARISON:  02/20/2017 CT angiogram head neck. 04/15/2017 MRA head. 08/25/2017 CT head. FINDINGS: CTA NECK FINDINGS Aortic arch: Standard branching. Imaged portion shows no evidence of aneurysm or dissection. No significant stenosis of the major arch vessel origins. Mild calcific atherosclerosis. Right carotid system: No evidence of dissection, stenosis (50% or greater) or occlusion. Mild calcific atherosclerosis of carotid bifurcation. Retropharyngeal course of proximal ICA. Left carotid system: No evidence of dissection, stenosis (50% or greater) or occlusion. Retropharyngeal course of proximal ICA and moderate tortuosity. Vertebral arteries: Right dominant. No evidence of  dissection, stenosis (50% or greater) or occlusion. Skeleton: Moderate cervical spondylosis with discogenic degenerative changes greatest from the C3 through C6 levels. No high-grade bony canal stenosis. Other neck: Negative. Upper chest: Negative. Review of the MIP images confirms the above findings CTA HEAD FINDINGS Anterior circulation: Left M2 inferior division occlusion with poor downstream collateralization. Otherwise patent anterior circulation without aneurysm, high-grade stenosis, or vascular malformation. Posterior circulation: No significant stenosis, proximal occlusion, aneurysm, or vascular malformation. Venous sinuses: As permitted by contrast timing, patent.  Anatomic variants: Large right posterior communicating artery. Small anterior communicating artery. No left posterior communicating artery identified, likely hypoplastic or absent. Delayed phase: No abnormal intracranial enhancement. Review of the MIP images confirms the above findings IMPRESSION: 1. Left M2 inferior division occlusion and poor downstream collateralization corresponding to area of recent infarction. 2. Patent carotid and vertebral arteries. No dissection, aneurysm, or significant stenosis by NASCET criteria is identified. 3. No additional large vessel occlusion in the anterior or posterior intracranial circulation identified. These results were called by telephone at the time of interpretation on 08/26/2017 at 12:13 am to Dr. Lorraine Lax, who verbally acknowledged these results. Electronically Signed   By: Kristine Garbe M.D.   On: 08/26/2017 00:22   Ct Head Wo Contrast  Result Date: 08/25/2017 CLINICAL DATA:  Altered mental status. History of stroke and expressive aphasia, pituitary adenoma. EXAM: CT HEAD WITHOUT CONTRAST TECHNIQUE: Contiguous axial images were obtained from the base of the skull through the vertex without intravenous contrast. COMPARISON:  CT HEAD August 10, 2017 and MRI of the head April 15, 2017 FINDINGS: BRAIN: LEFT frontotemporal parietal and lateral LEFT occipital lobe confluent cytotoxic edema with effaced sulci in regional mass effect. Patchy hypodensity LEFT basal ganglia. Old bilateral basal ganglia infarcts with mild ex vacuo dilatation LEFT lateral ventricle. No hydrocephalus. No intraparenchymal hemorrhage. Patchy to confluent supratentorial white matter hypodensities. Basal cisterns patent. No abnormal extra-axial fluid collections. VASCULAR: Dense LEFT MCA. Dolichoectatic intracranial vessels with moderate to severe calcific atherosclerosis. SKULL: No skull fracture. No significant scalp soft tissue swelling. SINUSES/ORBITS: The mastoid air-cells and  included paranasal sinuses are well-aerated.The included ocular globes and orbital contents are non-suspicious. OTHER: None. IMPRESSION: 1. Acute to early subacute large LEFT MCA and posterior watershed territory nonhemorrhagic infarct. Dense LEFT MCA compatible with thromboembolism. Regional mass effect without midline shift. 2. Old lacunar infarcts and moderate to severe chronic small vessel ischemic disease. 3. Critical Value/emergent results were called by telephone at the time of interpretation on 08/25/2017 at 7:10 pm to PA. Arlean Hopping , who verbally acknowledged these results. Electronically Signed   By: Elon Alas M.D.   On: 08/25/2017 19:11   Ct Angio Neck W Or Wo Contrast  Result Date: 08/26/2017 CLINICAL DATA:  67 y/o M; acute to early subacute left MCA and posterior watershed infarct. Dense left MCA. EXAM: CT ANGIOGRAPHY HEAD AND NECK TECHNIQUE: Multidetector CT imaging of the head and neck was performed using the standard protocol during bolus administration of intravenous contrast. Multiplanar CT image reconstructions and MIPs were obtained to evaluate the vascular anatomy. Carotid stenosis measurements (when applicable) are obtained utilizing NASCET criteria, using the distal internal carotid diameter as the denominator. CONTRAST:  50 cc Isovue 370 COMPARISON:  02/20/2017 CT angiogram head neck. 04/15/2017 MRA head. 08/25/2017 CT head. FINDINGS: CTA NECK FINDINGS Aortic arch: Standard branching. Imaged portion shows no evidence of aneurysm or dissection. No significant stenosis of the major arch vessel origins. Mild calcific atherosclerosis. Right carotid system: No evidence of  dissection, stenosis (50% or greater) or occlusion. Mild calcific atherosclerosis of carotid bifurcation. Retropharyngeal course of proximal ICA. Left carotid system: No evidence of dissection, stenosis (50% or greater) or occlusion. Retropharyngeal course of proximal ICA and moderate tortuosity. Vertebral arteries:  Right dominant. No evidence of dissection, stenosis (50% or greater) or occlusion. Skeleton: Moderate cervical spondylosis with discogenic degenerative changes greatest from the C3 through C6 levels. No high-grade bony canal stenosis. Other neck: Negative. Upper chest: Negative. Review of the MIP images confirms the above findings CTA HEAD FINDINGS Anterior circulation: Left M2 inferior division occlusion with poor downstream collateralization. Otherwise patent anterior circulation without aneurysm, high-grade stenosis, or vascular malformation. Posterior circulation: No significant stenosis, proximal occlusion, aneurysm, or vascular malformation. Venous sinuses: As permitted by contrast timing, patent. Anatomic variants: Large right posterior communicating artery. Small anterior communicating artery. No left posterior communicating artery identified, likely hypoplastic or absent. Delayed phase: No abnormal intracranial enhancement. Review of the MIP images confirms the above findings IMPRESSION: 1. Left M2 inferior division occlusion and poor downstream collateralization corresponding to area of recent infarction. 2. Patent carotid and vertebral arteries. No dissection, aneurysm, or significant stenosis by NASCET criteria is identified. 3. No additional large vessel occlusion in the anterior or posterior intracranial circulation identified. These results were called by telephone at the time of interpretation on 08/26/2017 at 12:13 am to Dr. Lorraine Lax, who verbally acknowledged these results. Electronically Signed   By: Kristine Garbe M.D.   On: 08/26/2017 00:22   Dg Chest Port 1 View  Result Date: 08/25/2017 CLINICAL DATA:  Sepsis EXAM: PORTABLE CHEST 1 VIEW COMPARISON:  08/15/2017 FINDINGS: Dialysis catheter is again seen. Cardiac shadow is mildly prominent but stable. Mild vascular congestion is again seen likely related to volume overload. No focal infiltrate is noted. No acute bony abnormality is seen.  IMPRESSION: Mild vascular congestion. Electronically Signed   By: Inez Catalina M.D.   On: 08/25/2017 18:27     STUDIES:  CT 3/11 massive L MCA CVA that I reviewed myself  CULTURES: None  ANTIBIOTICS: None  SIGNIFICANT EVENTS: 3/11 CVA, outside window of interventions  LINES/TUBES: R tunneled IJ dialysis catheter date unknown   DISCUSSION: 67 year old male with extensive PMH who presents to the hospital with a massive L MCA CVA outside the window of intervention that on 3/12 started developing respiratory failure due to inability to protect his airway from severe edema and likely mass effect.  IMTS resident was unable to get a hold of family and PCCM was called for intubation.  We went down to evaluate the patient and given neuro exam and radiologic findings I can not recommend full code status.  Patient has no reasonable chance at any meaningful recovery.   Luckily, the patient is a patient of [facility] and they will speak with family and hopefully assist with [place] discussion and make patient a full DNR.  Will not place any orders unless called back which means the family is insisting on full code status in which case the patient would not need to be intubated.  PCCM will stay on standby.  The patient is critically ill with multiple organ systems failure and requires high complexity decision making for assessment and support, frequent evaluation and titration of therapies, application of advanced monitoring technologies and extensive interpretation of multiple databases.   Critical Care Time devoted to patient care services described in this note is  35  Minutes. This time reflects time of care of this signee  Dr Jennet Maduro. This critical care time does not reflect procedure time, or teaching time or supervisory time of PA/NP/Med student/Med Resident etc but could involve care discussion time.  Rush Farmer, M.D. [facility] Pulmonary/Critical Care Medicine. Pager: [phone]. After  hours pager: [phone].

## 2017-08-26 NOTE — ED Notes (Signed)
SCD ordered through materials management

## 2017-08-26 NOTE — ED Notes (Signed)
No new order given by admitting MD after updating on pt.'s hypertension .

## 2017-08-26 NOTE — ED Notes (Signed)
Family members placed chapstick on pt lips. Nothing placed into mouth.

## 2017-08-26 NOTE — Progress Notes (Signed)
Met with patient's son and family to update on patient's hospital course. Discussed the patient's poor prognosis given the size of his stroke and his multiple medical conditions.  After discussion, the patient's son would like to change his father's code status to DNR/DNI and pursue full comfort measures with the plan for the patient to be transferred to residential hospice. Social work consult has been placed.   Arvil Chaco, MD Internal Medicine PGY1 Pager # [phone]

## 2017-08-26 NOTE — ED Notes (Signed)
Family at bedside. 

## 2017-08-26 NOTE — Progress Notes (Deleted)
Entered in error

## 2017-08-26 NOTE — ED Notes (Signed)
Bed control notified about change in order stating change to med-surg rather than SDU. Admitting team paged to have request changed.

## 2017-08-26 NOTE — Progress Notes (Addendum)
NEUROHOSPITALISTS STROKE TEAM - DAILY PROGRESS NOTE   ADMISSION HISTORY: Blake Brookens Sr. is an 67 y.o. male  patient with PMH of multiple CVAs, CHF, DM, HTN, and ESRD on HD who lives in nursing home brought to [facility] ER for altered mental status and not speaking since yesterday. The patient had unwitnessed fall yesterday at the facility, found today morning to be more altered and was brought to the emergency room. A stat CT head showed a established large left MCA acute ischemic stroke and neurology was consulted. .  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 CTA head and neck shows left M2 thrombus.He is on Plavix and atorvastatin. His baseline was verbal and conversational  Date last known well:? 3.10.19 Time last known well: unclear tPA Given: no,outside window NIHSS: 30 Baseline MRS 3  SUBJECTIVE (INTERVAL HISTORY) No family is at the bedside. Patient is found laying in bed, mild respiratory distress due to seretion. Left gaze preference and right neglect and right hemiplegia with global aphasia.  Laboratory Results  CBC:  Recent Labs  Lab 08/25/17 1854  WBC 10.8*  HGB 12.7*  HCT 39.7  MCV 99.3  PLT 372   BMP: Recent Labs  Lab 08/25/17 1854  NA 141  K 3.7  CL 96*  CO2 28  GLUCOSE 159*  BUN 48*  CREATININE 9.21*  CALCIUM 9.2   Coagulation Studies:  Recent Labs    08/25/17 1854  APTT 30  INR 1.11   Urinalysis:  Recent Labs  Lab 08/25/17 2028  COLORURINE YELLOW  APPEARANCEUR CLEAR  LABSPEC 1.015  PHURINE 6.0  GLUCOSEU NEGATIVE  HGBUR SMALL*  BILIRUBINUR NEGATIVE  KETONESUR NEGATIVE  PROTEINUR >=300*  NITRITE NEGATIVE  LEUKOCYTESUR NEGATIVE   Urine Drug Screen:     Component Value Date/Time   LABOPIA NONE DETECTED 04/18/2017 0602   COCAINSCRNUR NONE DETECTED 04/18/2017 0602    LABBENZ NONE DETECTED 04/18/2017 0602   AMPHETMU NONE DETECTED 04/18/2017 0602   THCU NONE DETECTED 04/18/2017 0602   LABBARB NONE DETECTED 04/18/2017 0602    Alcohol Level: No results for input(s): ETH in the last 168 hours.  Physical Examination   Vitals:   08/26/17 1500 08/26/17 1600 08/26/17 1700 08/26/17 1800  BP: (!) 161/85 (!) 160/96 (!) 154/87 (!) 156/86  Pulse: 80 87 81 87  Resp: 13 (!) 26 18 (!) 21  Temp:      TempSrc:      SpO2: 99% 97% 96% 95%    Pulse Rate:  [71-101] 87 (03/12 1800) Resp:  [13-34] 21 (03/12 1800) BP: (139-208)/(42-110) 156/86 (03/12 1800) SpO2:  [88 %-99 %] 95 % (03/12 1800) FiO2 (%):  [98 %] 98 % (03/12 0050)  General - Well nourished, well developed, mild respiratory distress due to secretions.  Ophthalmologic - Fundi not visualized due to noncooperation.  Cardiovascular - Regular rate, but frequent PACs.  Neuro - drowsy but easily arousable, left gaze preference, right neglect, global aphasia, not following commands.  Blinking to visual threat to the left, not to the right.  PERRL, left gaze preference, not crossing midline.  Right facial droop, tongue midline in mouth.  Left upper and lower extremity spontaneous movement, able to against gravity.  However, right upper and lower extremity hemiplegia.  DTR 1+, right Babinski positive. Sensation, coordination and gait not tested.    Imaging Results  Ct Angio Head W Or Wo  Contrast  Result Date: 08/26/2017 CLINICAL DATA:  67 y/o M; acute to early subacute left MCA and posterior watershed infarct. Dense left MCA. EXAM: CT ANGIOGRAPHY HEAD AND NECK TECHNIQUE: Multidetector CT imaging of the head and neck was performed using the standard protocol during bolus administration of intravenous contrast. Multiplanar CT image reconstructions and MIPs were obtained to evaluate the vascular anatomy. Carotid stenosis measurements (when applicable) are obtained utilizing NASCET criteria, using the distal  internal carotid diameter as the denominator. CONTRAST:  50 cc Isovue 370 COMPARISON:  02/20/2017 CT angiogram head neck. 04/15/2017 MRA head. 08/25/2017 CT head. FINDINGS: CTA NECK FINDINGS Aortic arch: Standard branching. Imaged portion shows no evidence of aneurysm or dissection. No significant stenosis of the major arch vessel origins. Mild calcific atherosclerosis. Right carotid system: No evidence of dissection, stenosis (50% or greater) or occlusion. Mild calcific atherosclerosis of carotid bifurcation. Retropharyngeal course of proximal ICA. Left carotid system: No evidence of dissection, stenosis (50% or greater) or occlusion. Retropharyngeal course of proximal ICA and moderate tortuosity. Vertebral arteries: Right dominant. No evidence of dissection, stenosis (50% or greater) or occlusion. Skeleton: Moderate cervical spondylosis with discogenic degenerative changes greatest from the C3 through C6 levels. No high-grade bony canal stenosis. Other neck: Negative. Upper chest: Negative. Review of the MIP images confirms the above findings CTA HEAD FINDINGS Anterior circulation: Left M2 inferior division occlusion with poor downstream collateralization. Otherwise patent anterior circulation without aneurysm, high-grade stenosis, or vascular malformation. Posterior circulation: No significant stenosis, proximal occlusion, aneurysm, or vascular malformation. Venous sinuses: As permitted by contrast timing, patent. Anatomic variants: Large right posterior communicating artery. Small anterior communicating artery. No left posterior communicating artery identified, likely hypoplastic or absent. Delayed phase: No abnormal intracranial enhancement. Review of the MIP images confirms the above findings IMPRESSION: 1. Left M2 inferior division occlusion and poor downstream collateralization corresponding to area of recent infarction. 2. Patent carotid and vertebral arteries. No dissection, aneurysm, or significant  stenosis by NASCET criteria is identified. 3. No additional large vessel occlusion in the anterior or posterior intracranial circulation identified. These results were called by telephone at the time of interpretation on 08/26/2017 at 12:13 am to Dr. Lorraine Lax, who verbally acknowledged these results. Electronically Signed   By: Kristine Garbe M.D.   On: 08/26/2017 00:22   Ct Head Wo Contrast  Result Date: 08/26/2017 CLINICAL DATA:  Follow-up examination for acute left MCA stroke. EXAM: CT HEAD WITHOUT CONTRAST TECHNIQUE: Contiguous axial images were obtained from the base of the skull through the vertex without intravenous contrast. COMPARISON:  Prior CT and CTA from 08/25/2017. FINDINGS: Brain: Continued interval evolution of large early subacute left MCA territory infarct, stable in size and distribution as compared to previous exams. No evidence for hemorrhagic transformation or other complication. Associated regional edema without significant mass effect. No midline shift. No new acute large vessel territory infarct. No acute intracranial hemorrhage. Moderate atrophy with chronic small vessel ischemic disease and remote chronic basal ganglia lacunar infarcts again noted. No mass lesion. No hydrocephalus. No extra-axial fluid collection. Vascular: Diffuse hyperdensity throughout the intracranial vasculature due to IV contrast administration from recent CTA. Calcified atherosclerosis at the skull base. Skull: Scalp soft tissues and calvarium within normal limits. Sinuses/Orbits: Globes and orbital soft tissues within normal limits. Paranasal sinuses and mastoid air cells remain clear. Other: None. IMPRESSION: 1. Continued interval evolution of large early subacute left MCA territory infarct, stable in size and distribution as compared to previous. No evidence for hemorrhagic transformation. 2. No  other new acute intracranial process identified. Electronically Signed   By: Jeannine Boga M.D.   On:  08/26/2017 13:34   Ct Head Wo Contrast  Result Date: 08/25/2017 CLINICAL DATA:  Altered mental status. History of stroke and expressive aphasia, pituitary adenoma. EXAM: CT HEAD WITHOUT CONTRAST TECHNIQUE: Contiguous axial images were obtained from the base of the skull through the vertex without intravenous contrast. COMPARISON:  CT HEAD August 10, 2017 and MRI of the head April 15, 2017 FINDINGS: BRAIN: LEFT frontotemporal parietal and lateral LEFT occipital lobe confluent cytotoxic edema with effaced sulci in regional mass effect. Patchy hypodensity LEFT basal ganglia. Old bilateral basal ganglia infarcts with mild ex vacuo dilatation LEFT lateral ventricle. No hydrocephalus. No intraparenchymal hemorrhage. Patchy to confluent supratentorial white matter hypodensities. Basal cisterns patent. No abnormal extra-axial fluid collections. VASCULAR: Dense LEFT MCA. Dolichoectatic intracranial vessels with moderate to severe calcific atherosclerosis. SKULL: No skull fracture. No significant scalp soft tissue swelling. SINUSES/ORBITS: The mastoid air-cells and included paranasal sinuses are well-aerated.The included ocular globes and orbital contents are non-suspicious. OTHER: None. IMPRESSION: 1. Acute to early subacute large LEFT MCA and posterior watershed territory nonhemorrhagic infarct. Dense LEFT MCA compatible with thromboembolism. Regional mass effect without midline shift. 2. Old lacunar infarcts and moderate to severe chronic small vessel ischemic disease. 3. Critical Value/emergent results were called by telephone at the time of interpretation on 08/25/2017 at 7:10 pm to PA. Arlean Hopping , who verbally acknowledged these results. Electronically Signed   By: Elon Alas M.D.   On: 08/25/2017 19:11   Ct Angio Neck W Or Wo Contrast  Result Date: 08/26/2017 CLINICAL DATA:  67 y/o M; acute to early subacute left MCA and posterior watershed infarct. Dense left MCA. EXAM: CT ANGIOGRAPHY HEAD AND NECK  TECHNIQUE: Multidetector CT imaging of the head and neck was performed using the standard protocol during bolus administration of intravenous contrast. Multiplanar CT image reconstructions and MIPs were obtained to evaluate the vascular anatomy. Carotid stenosis measurements (when applicable) are obtained utilizing NASCET criteria, using the distal internal carotid diameter as the denominator. CONTRAST:  50 cc Isovue 370 COMPARISON:  02/20/2017 CT angiogram head neck. 04/15/2017 MRA head. 08/25/2017 CT head. FINDINGS: CTA NECK FINDINGS Aortic arch: Standard branching. Imaged portion shows no evidence of aneurysm or dissection. No significant stenosis of the major arch vessel origins. Mild calcific atherosclerosis. Right carotid system: No evidence of dissection, stenosis (50% or greater) or occlusion. Mild calcific atherosclerosis of carotid bifurcation. Retropharyngeal course of proximal ICA. Left carotid system: No evidence of dissection, stenosis (50% or greater) or occlusion. Retropharyngeal course of proximal ICA and moderate tortuosity. Vertebral arteries: Right dominant. No evidence of dissection, stenosis (50% or greater) or occlusion. Skeleton: Moderate cervical spondylosis with discogenic degenerative changes greatest from the C3 through C6 levels. No high-grade bony canal stenosis. Other neck: Negative. Upper chest: Negative. Review of the MIP images confirms the above findings CTA HEAD FINDINGS Anterior circulation: Left M2 inferior division occlusion with poor downstream collateralization. Otherwise patent anterior circulation without aneurysm, high-grade stenosis, or vascular malformation. Posterior circulation: No significant stenosis, proximal occlusion, aneurysm, or vascular malformation. Venous sinuses: As permitted by contrast timing, patent. Anatomic variants: Large right posterior communicating artery. Small anterior communicating artery. No left posterior communicating artery identified, likely  hypoplastic or absent. Delayed phase: No abnormal intracranial enhancement. Review of the MIP images confirms the above findings IMPRESSION: 1. Left M2 inferior division occlusion and poor downstream collateralization corresponding to area of recent infarction. 2. Patent  carotid and vertebral arteries. No dissection, aneurysm, or significant stenosis by NASCET criteria is identified. 3. No additional large vessel occlusion in the anterior or posterior intracranial circulation identified. These results were called by telephone at the time of interpretation on 08/26/2017 at 12:13 am to Dr. Lorraine Lax, who verbally acknowledged these results. Electronically Signed   By: Kristine Garbe M.D.   On: 08/26/2017 00:22   Dg Chest Port 1 View  Result Date: 08/25/2017 CLINICAL DATA:  Sepsis EXAM: PORTABLE CHEST 1 VIEW COMPARISON:  08/15/2017 FINDINGS: Dialysis catheter is again seen. Cardiac shadow is mildly prominent but stable. Mild vascular congestion is again seen likely related to volume overload. No focal infiltrate is noted. No acute bony abnormality is seen. IMPRESSION: Mild vascular congestion. Electronically Signed   By: Inez Catalina M.D.   On: 08/25/2017 18:27   Echocardiogram:                                              PENDING  IMPRESSION AND PLAN  Mr. Delray Reza Sr. is a 67 y.o. male with PMH of multiple CVAs, CHF, DM, HTN, and ESRD on HD presents with large left MCA stroke resulting in global aphasia and right hemiplegia with neglect. CTA head shows Left M2 inferior division occlusion and poor downstream collateralization corresponding to area of recent infarction. Outside the window for any intervention including TPA and thrombectomy.   Acute large Left MCA stroke with Left M2 inferior division occlusion   Suspected Etiology: Left M2 inferior division occlusion and small vessel disease  Resultant Symptoms: global aphasia and right hemiplegia with neglect Stroke Risk Factors: diabetes  mellitus, hyperlipidemia and hypertension Other Stroke Risk Factors: Advanced age, Hx stroke, CHF, ESRD on HD  Outstanding Stroke Work-up Studies:       PLAN 08/26/2017  Continue to HOLD ASA and Plavix, remains NPO  Frequent neuro checks Telemetry monitoring PT/OT/SLP Consult Case Management /MSW Ongoing aggressive stroke risk factor management Patient's family  counseled to be compliant with his antithrombotic medications Patient's family counseled on Lifestyle modifications including, Diet, Exercise, and Stress Consult Palliative for goals of care discussion Follow up with [facility] in 6 weeks, if applicable  NEUROLOGICAL  ISSUES  HX OF STROKES: CVA (cerebral vascular accident) ([place]) 12/2013    admitted in July 2015 for acute right-sided infarct/notes 12/02/2014- 01/28/17-  uses walker    Stroke ([place]) 12/02/2014    expressive aphasia 01/28/17- sometimes   INTRACRANIAL Atherosclerosis &Stenosis: On Plavix at admission, on hold for now, NPO  DYSPHAGIA: NPO until passes SLP swallow evaluation Aspiration Precautions in progress  MEDICAL ISSUES   HYPERTENSION: Stable Permissive hypertension (OK if <220/120) for 24-48 hours post stroke and then gradually normalized within 5-7 days. Long term BP goal normotensive. May slowly restart home B/P medications after 48 hours Home Meds: yes  HYPERLIPIDEMIA:    Component Value Date/Time   CHOL 170 08/26/2017 0408   TRIG 83 08/26/2017 0408   HDL 59 08/26/2017 0408   CHOLHDL 2.9 08/26/2017 0408   VLDL 17 08/26/2017 0408   LDLCALC 94 08/26/2017 0408   Home Meds:  Lipitor 80 mg LDL  goal < 70 Statin on hold, patient NPO for now Continue statin at discharge, if applicable  DIABETES: Lab Results  Component Value Date   HGBA1C 6.6 (H) 08/26/2017  HgbA1c goal < 7.0 Currently on: Novolog  Continue CBG monitoring and SSI to maintain glucose 140-180 mg/dl DM education   OBESITY Morbid Obesity,     Hospital  day # 1 VTE prophylaxis: SCD's  Diet : Diet NPO time specified Fall precautions     FAMILY UPDATES: No family at bedside  TEAM UPDATES: Annia Belt, MD STATUS:    Discharge Information  Prior Home Stroke Medications: clopidogrel 75 mg daily  Discharge Stroke Meds:  Please discharge patient on TBD - NPO      Disposition: 01-Home or Self Care Therapy Recs:               PENDING  Follow up Appointments  Follow Up:  System, Pcp Not In -PCP Follow up in 1-2 weeks   Case Management aware of need     ATTENDING NOTE: I reviewed above note and agree with the assessment and plan. I have made any additions or clarifications directly to the above note. Pt was seen and examined.   67 year old male with history of multiple strokes, diabetes, hypertension, hyperlipidemia, OSA on CPAP, CHF, and ESRD on hemodialysis admitted for altered mental status and aphasia with right-sided weakness.  CT head showed left large MCA infarct.  CTA head and neck showed left M2 occlusion.  TTE 60-65% in 04/2017.  LDL 94 and A1c 6.6.  Patient stroke could be due to large vessel occlusion versus cardioembolic.  History of strokes - small vessel disease  12/2013 - left BG/CR infarct - A1c 9.9, LDL 145 - put on aspirin and Lipitor  11/2014 - left putamen/CR infarct - carotid Doppler negative EF 60-65% - A1c 7.1 and LDL 169 - aspirin switched to Plavix - continue Lipitor 80  02/2017 -bilateral MCA/ACA, left MCA/PCA watershed punctate infarct as well as left splenium punctate infarcts, likely due to hypotension during dialysis in the setting of intracranial atherosclerosis and small vessel disease.  CTA head and neck showed moderate proximal right M1 stenosis.  EF 65-70%.  LDL 50 and A1c 6.9.  Continue on dual antiplatelet on discharge.  Continue on Lipitor 80.  03/2017 - patient developed transient speech difficulties in the setting of hypotension and prolonged dialysis and MRI scan shows a tiny right parietal  white matter infarct which is likely from small vessel disease. Recommend avoid hypotension and dehydration during dialysis in the future and continuing aspirin and aggressive risk factor modification.   Currently, patient had significant neuro deficit with left MCA syndrome including left gaze, right neglect, right hemiplegia, global aphasia.  Patient also has dysphasia, difficulty handling secretions, risk for aspiration and respiratory distress. Poor prognosis.  Recommend pelvic care involvement for goals of care.  If would like aggressive medical treatment, he needs to be put on aspirin PR and consider TEE/loop recorder.  Will follow.  Rosalin Hawking, MD PhD Stroke Neurology 08/26/2017 7:21 PM  I spent  35 minutes in total face-to-face time with the patient, more than 50% of which was spent in counseling and coordination of care, reviewing test results, images and medication, and discussing the diagnosis of left large MCA infarct, treatment plan and potential prognosis. This patient's care requiresreview of multiple databases, neurological assessment, discussion with family, other specialists and medical decision making of high complexity.   To contact Stroke Provider, please refer to [email or web address] After hours, contact General Neurology

## 2017-08-26 NOTE — ED Notes (Signed)
Patient transported to CT 

## 2017-08-26 NOTE — Care Management Note (Signed)
Case Management Note  Patient Details  Name: Blake Bell. MRN: [id number] Date of Birth: [birth year]  Subjective/Objective:                  67yo male [facility]ACE patient with PMH of CVA, CHF, DM, HTN, and ESRD on HD [facility] who presents from [facility] with altered mental status.   Action/Plan: Admit status INPATIENT ([facility]); anticipate discharge RETURN TO [facility].   Expected Discharge Date:  (unknown)               Expected Discharge Plan:  Home/Self Care([facility])  In-House Referral:     Discharge planning Services  CM Consult  Post Acute Care Choice:    Choice offered to:     DME Arranged:    DME Agency:     HH Arranged:    HH Agency:     Status of Service:     If discussed at [employer], dates discussed:    Additional Comments:  Fuller Mandril, RN 08/26/2017, 11:00 AM

## 2017-08-26 NOTE — ED Notes (Signed)
CBG machine does not seem to be crossing data over. Pt's CBG taken at 11:52 was 133.

## 2017-08-26 NOTE — ED Notes (Signed)
Family asked RN to swab pt mouth. Oral Swabs requested from ICU.

## 2017-08-26 NOTE — Progress Notes (Signed)
MD has requested PT to discharge the order as pt is now unconscious and unable to do anything related to PT yet.  MD agreed to reorder PT when appropriate.   08/26/17 1100  PT Visit Information  Last PT Received On 08/26/17  Reason Eval/Treat Not Completed Medical issues which prohibited therapy;Patient's level of consciousness    Blake Bell, PT MS Acute Rehab Dept. Number: [place] [facility] [place]

## 2017-08-26 NOTE — Progress Notes (Signed)
Medicine attending: I examined this patient today together with resident physician Dr. Rochele Pages and I concur with her evaluation and management plan which we discussed together. Please see separate attending admission note for complete details. 67 year old man with multiple prior strokes.  End-stage renal disease on dialysis.  He presented yesterday with an acute, large, left MCA territory stroke with expressive and receptive aphasia and a dense right hemiplegia. His overall health status has been declining prior to this admission and in view of current active discussions with his primary care physician and family today, decision made to provide supportive care only.  No aggressive resuscitative efforts to be employed in the event of an irreversible deterioration in his condition.

## 2017-08-26 NOTE — ED Notes (Signed)
Please note: condom catheter applied at 11:15.

## 2017-08-27 DIAGNOSIS — Z66 Do not resuscitate: Secondary | ICD-10-CM

## 2017-08-27 DIAGNOSIS — G40109 Localization-related (focal) (partial) symptomatic epilepsy and epileptic syndromes with simple partial seizures, not intractable, without status epilepticus: Secondary | ICD-10-CM

## 2017-08-27 LAB — MRSA PCR SCREENING: MRSA BY PCR: NEGATIVE

## 2017-08-27 MED ORDER — DIAZEPAM 5 MG/ML IJ SOLN
2.5000 mg | Freq: Once | INTRAMUSCULAR | Status: AC
  Administered 2017-08-27: 2.5 mg via INTRAVENOUS
  Filled 2017-08-27: qty 2

## 2017-08-27 NOTE — Progress Notes (Signed)
Pt. remains lethargic with <'d LOC due to hx., would be unable to remove CPAP mask if needed for N/V.

## 2017-08-27 NOTE — Discharge Summary (Addendum)
Name: Blake Fountain Sr. MRN: [id number] DOB: Aug 10, 1950 67 y.o. PCP: System, Pcp Not In  Date of Admission: 08/25/2017  5:52 PM Date of Discharge:  Attending Physician: Annia Belt, MD  Discharge Diagnosis: 1. CVA, Left MCA Ischemic Infarct Active Problems:   HTN (hypertension), benign   Ischemic stroke (HCC)   Insulin dependent type 2 diabetes mellitus, controlled ([place])   ESRD (end stage renal disease) on dialysis [facility])   CVA (cerebral vascular accident) [facility])   Discharge Medications: Allergies as of 08/28/2017   No Known Allergies     Medication List    STOP taking these medications   acetaminophen 650 MG suppository Commonly known as:  TYLENOL   ACETAMINOPHEN 8 HOUR 650 MG CR tablet Generic drug:  acetaminophen   atorvastatin 80 MG tablet Commonly known as:  LIPITOR   BIOFREEZE EX   bisacodyl 10 MG suppository Commonly known as:  DULCOLAX   cinacalcet 30 MG tablet Commonly known as:  SENSIPAR   clopidogrel 75 MG tablet Commonly known as:  PLAVIX   escitalopram 10 MG tablet Commonly known as:  LEXAPRO   FOSRENOL 1000 MG chewable tablet Generic drug:  lanthanum   mineral oil-hydrophilic petrolatum ointment   RENAL VITAMIN PO   senna-docusate 8.6-50 MG tablet Commonly known as:  Senokot-S   sodium phosphate 7-19 GM/118ML Enem       Disposition and follow-up:   BlakeHazim Piatt Sr. was discharged from [facility] in Serious condition.  Please address the following:  1.   Goals of care, Left MCA infarct -Please address pain management and overall comfort with the family  -Current neuro deficits include: right hemiplegia, right hemineglect, leftward gaze preference, pinpoint pupils, which did not significantly improve  -Seizure like activity was managed with valium  -Please address any questions or concerns the family has about the patient's condition and prognosis  -Patient's perm cath was not removed as I did  not want to put the patient through a unnecessary procedure  ESRD  -Patient did not receive any HD while hospitalized  -It was decided my family to stop HD  2.  Labs / imaging needed at time of follow-up: none  3.  Pending labs/ test needing follow-up: none   Follow-up Appointments:   Hospital Course by problem list: Active Problems:   HTN (hypertension), benign   Ischemic stroke ([place])   Insulin dependent type 2 diabetes mellitus, controlled ([place])   ESRD (end stage renal disease) on dialysis [facility])   CVA (cerebral vascular accident) ([place])   1. CVA, Large left MCA Infarct Blake Bell was admitted to [facility] and the Internal Medicine Teaching service for altered mental status. In the ED, CT scan of the head was performed and showed a large left sided MCA infarct.  Neurology was consulted and the patient was not a candiate for tPA therapy as he was outside the window. On initial examination, the patient was globally aphasic, neglecting his right side with associated plegia on his right side. Follow up CTA of the head and neck revealed a left M2 thrombus. Repeat CT head was ordered the following day to evaluate for midline shift and cerebral edema. The repeat CT head was negative for cerebral edema or midline shift. The patient's neurological status did not improve significantly during his hospitalization. He remains globally aphasic, with right hemi-neglect and paralysis, leftward gaze preference.   Goals of care discussion with Blake Bell family was facilitated by Claudia Desanctis of the [facility] PCP  Dr. Jimmye Norman, IMTS, and Neurology. Given the patient's comorbid conditions including acute large MCA infarct, ESRD, and congestive heart failure it was decided that he would benefit most from hospice placement and to for go dialysis treatments. The family is in agreement with this plan and the patient was and made DNR/DNI. The patient did not appear to be in distress or discomfort during his  hospitalization. He did exhibit rhythmic, seizure like acitivty of his jaw that was resolved with valium.   Discharge Vitals:   BP (!) 98/50 (BP Location: Right Arm)   Pulse 88   Temp 99 F (37.2 C) (Oral)   Resp 18   Wt 285 lb 15 oz (129.7 kg)   SpO2 94%   BMI 41.62 kg/m   Pertinent Labs, Studies, and Procedures:  BMP Latest Ref Rng & Units 08/25/2017 08/10/2017 04/19/2017  Glucose 65 - 99 mg/dL 159(H) 152(H) 115(H)  BUN 6 - 20 mg/dL 48(H) 32(H) 21(H)  Creatinine 0.61 - 1.24 mg/dL 9.21(H) 6.44(H) 3.91(H)  BUN/Creat Ratio 10 - 24 - - -  Sodium 135 - 145 mmol/L 141 138 131(L)  Potassium 3.5 - 5.1 mmol/L 3.7 3.6 3.0(L)  Chloride 101 - 111 mmol/L 96(L) 96(L) 93(L)  CO2 22 - 32 mmol/L 28 28 28   Calcium 8.9 - 10.3 mg/dL 9.2 9.1 8.4(L)   CBC Latest Ref Rng & Units 08/25/2017 08/10/2017 04/19/2017  WBC 4.0 - 10.5 K/uL 10.8(H) 12.4(H) 9.0  Hemoglobin 13.0 - 17.0 g/dL 12.7(L) 12.6(L) 12.9(L)  Hematocrit 39.0 - 52.0 % 39.7 38.8(L) 38.7(L)  Platelets 150 - 400 K/uL 372 381 321   CT Head - 08/25/2017 IMPRESSION: 1. Acute to early subacute large LEFT MCA and posterior watershed territory nonhemorrhagic infarct. Dense LEFT MCA compatible with thromboembolism. Regional mass effect without midline shift. 2. Old lacunar infarcts and moderate to severe chronic small vessel ischemic disease.  CTA Head and neck - 08/25/2017 IMPRESSION: 1. Left M2 inferior division occlusion and poor downstream collateralization corresponding to area of recent infarction. 2. Patent carotid and vertebral arteries. No dissection, aneurysm, or significant stenosis by NASCET criteria is identified. 3. No additional large vessel occlusion in the anterior or posterior intracranial circulation identified.  CT Head - 08/27/2017 IMPRESSION: 1. Continued interval evolution of large early subacute left MCA territory infarct, stable in size and distribution as compared to previous. No evidence for hemorrhagic  transformation. 2. No other new acute intracranial process identified.  Discharge Instructions: Discharge Instructions    Diet - low sodium heart healthy   Complete by:  As directed    Increase activity slowly   Complete by:  As directed       Signed: Melanee Spry, MD 08/28/2017, 11:25 AM   Pager: [phone]

## 2017-08-27 NOTE — Progress Notes (Signed)
[facility] can accept patient tomorrow after completing paperwork with the son (unable to complete it today).  Blake Locus Shyheem Whitham LCSW [phone]

## 2017-08-27 NOTE — Progress Notes (Signed)
   Subjective:  Patient seen and examined. He was awake this morning, but not able to interact with the medical team due expressive and receptive aphasia. No family members in the room this morning.   Objective:  Vital signs in last 24 hours: Vitals:   08/26/17 1800 08/26/17 1900 08/26/17 2000 08/26/17 2053  BP: (!) 156/86 (!) 161/81 (!) 163/86 (!) 165/86  Pulse: 87 84 87 87  Resp: (!) 21 18  (!) 22  Temp:    100.3 F (37.9 C)  TempSrc:    Axillary  SpO2: 95% 98% 96% 96%  Weight:    285 lb 15 oz (129.7 kg)   General: Laying in bed, NAD HEENT: [place]/AT, does not tract and had leftward gaze deviation, no scleral icterus, pinpoint pupils Cardiac: RRR, No R/M/G appreciated Pulm: normal effort, CTAB in anterior lung fields Abd: soft, non tender, non distended, BS normal Ext: extremities well perfused, no peripheral edema Neuro: Eye opening to verbal command; GCS 6; 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      Assessment/Plan:  Active Problems:   HTN (hypertension), benign   Ischemic stroke (HCC)   Insulin dependent type 2 diabetes mellitus, controlled ([place])   ESRD (end stage renal disease) on dialysis [facility])   CVA (cerebral vascular accident) ([place])   Goals of care Nonhemorrhagic CVA of left MCA ESRD on HD  After discussion with [facility] PCP Dr. Jimmye Norman and family members, the patient has been transitioned to full comfort measures and is awaiting placement for residential hospice. HD will not be resumed. Follow-up CT scan showed large area of infarction stable compared with prior study with no evidence for hemorrhagic transformation.  No mass-effect, no midline shift, no new areas of acute infarction. -Will give low dose valium for seizure like activity seen today on examination -Comfort measures only  -Appreciate social works help with  placement  -Daily vital signs  -No other aggressive measures  -Code Status confirmed: DNR/DNI     Dispo: Anticipated discharge in approximately 1-2 day(s).   Melanee Spry, MD 08/27/2017, 11:36 AM Pager: [phone]

## 2017-08-27 NOTE — Progress Notes (Signed)
CSW received referral regarding hospice placement. CSW spoke with [facility] social worker who has confirmed that family agreeable to [facility] (since [facility] does not have beds). CSW sent referral for assessment.   Percell Locus Albena Comes LCSW [phone]

## 2017-08-27 NOTE — Progress Notes (Signed)
Medicine attending: I examined this patient today together with resident physician Dr. Rochele Pages and I concur with her evaluation and management plan.  He is now 48 hours post extensive left MCA territory stroke.  Follow-up CT scan showed large area of infarction stable compared with prior study with no evidence for hemorrhagic transformation.  No mass-effect.  No midline shift.  No new areas of acute infarction. He is awake but has global expressive and receptive a aphasia.  Not following commands.  Intermittent Jacksonian seizure activity with twitching of the right side of his face.  Pupils equal round and reactive. Dr. Aggie Hacker met with the patient's family yesterday and has been in ongoing contact with his primary care physician Dr. Jimmye Norman.  Given dismal prognosis for recovery, all parties concerned support comfort measure only status at this time.  If he is stable enough, we will proceed with plans for residential hospice placement.

## 2017-08-27 NOTE — Progress Notes (Addendum)
NEUROHOSPITALISTS STROKE TEAM - DAILY PROGRESS NOTE   SUBJECTIVE (INTERVAL HISTORY) wife is at the bedside. Pt able to open eyes on voice. Still has left gaze preference and right neglect and right hemiplegia with global aphasia. Family has decided comfort care measures due to poor prognosis. I agree.   Laboratory Results  CBC:  Recent Labs  Lab 08/25/17 1854  WBC 10.8*  HGB 12.7*  HCT 39.7  MCV 99.3  PLT 372   BMP: Recent Labs  Lab 08/25/17 1854  NA 141  K 3.7  CL 96*  CO2 28  GLUCOSE 159*  BUN 48*  CREATININE 9.21*  CALCIUM 9.2   Coagulation Studies:  Recent Labs    08/25/17 1854  APTT 30  INR 1.11   Urinalysis:  Recent Labs  Lab 08/25/17 2028  COLORURINE YELLOW  APPEARANCEUR CLEAR  LABSPEC 1.015  PHURINE 6.0  GLUCOSEU NEGATIVE  HGBUR SMALL*  BILIRUBINUR NEGATIVE  KETONESUR NEGATIVE  PROTEINUR >=300*  NITRITE NEGATIVE  LEUKOCYTESUR NEGATIVE   Urine Drug Screen:     Component Value Date/Time   LABOPIA NONE DETECTED 04/18/2017 0602   COCAINSCRNUR NONE DETECTED 04/18/2017 0602   LABBENZ NONE DETECTED 04/18/2017 0602   AMPHETMU NONE DETECTED 04/18/2017 0602   THCU NONE DETECTED 04/18/2017 0602   LABBARB NONE DETECTED 04/18/2017 0602    Alcohol Level: No results for input(s): ETH in the last 168 hours.  Physical Examination   Vitals:   08/26/17 1800 08/26/17 1900 08/26/17 2000 08/26/17 2053  BP: (!) 156/86 (!) 161/81 (!) 163/86 (!) 165/86  Pulse: 87 84 87 87  Resp: (!) 21 18  (!) 22  Temp:    100.3 F (37.9 C)  TempSrc:    Axillary  SpO2: 95% 98% 96% 96%  Weight:    285 lb 15 oz (129.7 kg)    Temp:  [100.3 F (37.9 C)] 100.3 F (37.9 C) (03/12 2053) Pulse Rate:  [71-87] 87 (03/12 2053) Resp:  [13-26] 22 (03/12 2053) BP: (145-165)/(81-96) 165/86 (03/12 2053) SpO2:  [95 %-99 %] 96 % (03/12 2053) Weight:  [285 lb 15 oz (129.7 kg)] 285 lb 15 oz (129.7 kg) (03/12 2053)  General - Well  nourished, well developed, mild respiratory distress.  Ophthalmologic - Fundi not visualized due to noncooperation.  Cardiovascular - Regular rate, but frequent PACs.  Neuro - drowsy but easily arousable and open eyes, left gaze preference, right neglect, global aphasia, not following commands.  Blinking to visual threat to the left, not to the right.  PERRL, left gaze preference, not crossing midline.  Right facial droop, tongue midline in mouth.  Left upper and lower extremity spontaneous movement, able to against gravity.  However, right upper and lower extremity hemiplegia.  DTR 1+, right Babinski positive. Sensation, coordination and gait not tested.    Imaging Results  Ct Angio Head W Or Wo Contrast  Result Date: 08/26/2017 CLINICAL DATA:  67 y/o M; acute to early subacute left MCA and posterior watershed infarct. Dense left MCA. EXAM: CT ANGIOGRAPHY HEAD AND NECK TECHNIQUE: Multidetector CT imaging of the head and neck was performed using the standard protocol during bolus administration of intravenous contrast. Multiplanar CT image reconstructions and MIPs were obtained to evaluate the vascular anatomy. Carotid stenosis measurements (when applicable) are obtained utilizing NASCET criteria, using the distal internal carotid diameter as the denominator. CONTRAST:  50 cc Isovue 370 COMPARISON:  02/20/2017 CT angiogram head neck. 04/15/2017 MRA head. 08/25/2017 CT head. FINDINGS: CTA NECK FINDINGS Aortic arch:  Standard branching. Imaged portion shows no evidence of aneurysm or dissection. No significant stenosis of the major arch vessel origins. Mild calcific atherosclerosis. Right carotid system: No evidence of dissection, stenosis (50% or greater) or occlusion. Mild calcific atherosclerosis of carotid bifurcation. Retropharyngeal course of proximal ICA. Left carotid system: No evidence of dissection, stenosis (50% or greater) or occlusion. Retropharyngeal course of proximal ICA and moderate  tortuosity. Vertebral arteries: Right dominant. No evidence of dissection, stenosis (50% or greater) or occlusion. Skeleton: Moderate cervical spondylosis with discogenic degenerative changes greatest from the C3 through C6 levels. No high-grade bony canal stenosis. Other neck: Negative. Upper chest: Negative. Review of the MIP images confirms the above findings CTA HEAD FINDINGS Anterior circulation: Left M2 inferior division occlusion with poor downstream collateralization. Otherwise patent anterior circulation without aneurysm, high-grade stenosis, or vascular malformation. Posterior circulation: No significant stenosis, proximal occlusion, aneurysm, or vascular malformation. Venous sinuses: As permitted by contrast timing, patent. Anatomic variants: Large right posterior communicating artery. Small anterior communicating artery. No left posterior communicating artery identified, likely hypoplastic or absent. Delayed phase: No abnormal intracranial enhancement. Review of the MIP images confirms the above findings IMPRESSION: 1. Left M2 inferior division occlusion and poor downstream collateralization corresponding to area of recent infarction. 2. Patent carotid and vertebral arteries. No dissection, aneurysm, or significant stenosis by NASCET criteria is identified. 3. No additional large vessel occlusion in the anterior or posterior intracranial circulation identified. These results were called by telephone at the time of interpretation on 08/26/2017 at 12:13 am to Dr. Lorraine Lax, who verbally acknowledged these results. Electronically Signed   By: Kristine Garbe M.D.   On: 08/26/2017 00:22   Ct Head Wo Contrast  Result Date: 08/26/2017 CLINICAL DATA:  Follow-up examination for acute left MCA stroke. EXAM: CT HEAD WITHOUT CONTRAST TECHNIQUE: Contiguous axial images were obtained from the base of the skull through the vertex without intravenous contrast. COMPARISON:  Prior CT and CTA from 08/25/2017.  FINDINGS: Brain: Continued interval evolution of large early subacute left MCA territory infarct, stable in size and distribution as compared to previous exams. No evidence for hemorrhagic transformation or other complication. Associated regional edema without significant mass effect. No midline shift. No new acute large vessel territory infarct. No acute intracranial hemorrhage. Moderate atrophy with chronic small vessel ischemic disease and remote chronic basal ganglia lacunar infarcts again noted. No mass lesion. No hydrocephalus. No extra-axial fluid collection. Vascular: Diffuse hyperdensity throughout the intracranial vasculature due to IV contrast administration from recent CTA. Calcified atherosclerosis at the skull base. Skull: Scalp soft tissues and calvarium within normal limits. Sinuses/Orbits: Globes and orbital soft tissues within normal limits. Paranasal sinuses and mastoid air cells remain clear. Other: None. IMPRESSION: 1. Continued interval evolution of large early subacute left MCA territory infarct, stable in size and distribution as compared to previous. No evidence for hemorrhagic transformation. 2. No other new acute intracranial process identified. Electronically Signed   By: Jeannine Boga M.D.   On: 08/26/2017 13:34   Ct Head Wo Contrast  Result Date: 08/25/2017 CLINICAL DATA:  Altered mental status. History of stroke and expressive aphasia, pituitary adenoma. EXAM: CT HEAD WITHOUT CONTRAST TECHNIQUE: Contiguous axial images were obtained from the base of the skull through the vertex without intravenous contrast. COMPARISON:  CT HEAD August 10, 2017 and MRI of the head April 15, 2017 FINDINGS: BRAIN: LEFT frontotemporal parietal and lateral LEFT occipital lobe confluent cytotoxic edema with effaced sulci in regional mass effect. Patchy hypodensity LEFT basal ganglia. Old  bilateral basal ganglia infarcts with mild ex vacuo dilatation LEFT lateral ventricle. No hydrocephalus. No  intraparenchymal hemorrhage. Patchy to confluent supratentorial white matter hypodensities. Basal cisterns patent. No abnormal extra-axial fluid collections. VASCULAR: Dense LEFT MCA. Dolichoectatic intracranial vessels with moderate to severe calcific atherosclerosis. SKULL: No skull fracture. No significant scalp soft tissue swelling. SINUSES/ORBITS: The mastoid air-cells and included paranasal sinuses are well-aerated.The included ocular globes and orbital contents are non-suspicious. OTHER: None. IMPRESSION: 1. Acute to early subacute large LEFT MCA and posterior watershed territory nonhemorrhagic infarct. Dense LEFT MCA compatible with thromboembolism. Regional mass effect without midline shift. 2. Old lacunar infarcts and moderate to severe chronic small vessel ischemic disease. 3. Critical Value/emergent results were called by telephone at the time of interpretation on 08/25/2017 at 7:10 pm to PA. Arlean Hopping , who verbally acknowledged these results. Electronically Signed   By: Elon Alas M.D.   On: 08/25/2017 19:11   Ct Angio Neck W Or Wo Contrast  Result Date: 08/26/2017 CLINICAL DATA:  67 y/o M; acute to early subacute left MCA and posterior watershed infarct. Dense left MCA. EXAM: CT ANGIOGRAPHY HEAD AND NECK TECHNIQUE: Multidetector CT imaging of the head and neck was performed using the standard protocol during bolus administration of intravenous contrast. Multiplanar CT image reconstructions and MIPs were obtained to evaluate the vascular anatomy. Carotid stenosis measurements (when applicable) are obtained utilizing NASCET criteria, using the distal internal carotid diameter as the denominator. CONTRAST:  50 cc Isovue 370 COMPARISON:  02/20/2017 CT angiogram head neck. 04/15/2017 MRA head. 08/25/2017 CT head. FINDINGS: CTA NECK FINDINGS Aortic arch: Standard branching. Imaged portion shows no evidence of aneurysm or dissection. No significant stenosis of the major arch vessel origins. Mild  calcific atherosclerosis. Right carotid system: No evidence of dissection, stenosis (50% or greater) or occlusion. Mild calcific atherosclerosis of carotid bifurcation. Retropharyngeal course of proximal ICA. Left carotid system: No evidence of dissection, stenosis (50% or greater) or occlusion. Retropharyngeal course of proximal ICA and moderate tortuosity. Vertebral arteries: Right dominant. No evidence of dissection, stenosis (50% or greater) or occlusion. Skeleton: Moderate cervical spondylosis with discogenic degenerative changes greatest from the C3 through C6 levels. No high-grade bony canal stenosis. Other neck: Negative. Upper chest: Negative. Review of the MIP images confirms the above findings CTA HEAD FINDINGS Anterior circulation: Left M2 inferior division occlusion with poor downstream collateralization. Otherwise patent anterior circulation without aneurysm, high-grade stenosis, or vascular malformation. Posterior circulation: No significant stenosis, proximal occlusion, aneurysm, or vascular malformation. Venous sinuses: As permitted by contrast timing, patent. Anatomic variants: Large right posterior communicating artery. Small anterior communicating artery. No left posterior communicating artery identified, likely hypoplastic or absent. Delayed phase: No abnormal intracranial enhancement. Review of the MIP images confirms the above findings IMPRESSION: 1. Left M2 inferior division occlusion and poor downstream collateralization corresponding to area of recent infarction. 2. Patent carotid and vertebral arteries. No dissection, aneurysm, or significant stenosis by NASCET criteria is identified. 3. No additional large vessel occlusion in the anterior or posterior intracranial circulation identified. These results were called by telephone at the time of interpretation on 08/26/2017 at 12:13 am to Dr. Lorraine Lax, who verbally acknowledged these results. Electronically Signed   By: Kristine Garbe  M.D.   On: 08/26/2017 00:22   Dg Chest Port 1 View  Result Date: 08/25/2017 CLINICAL DATA:  Sepsis EXAM: PORTABLE CHEST 1 VIEW COMPARISON:  08/15/2017 FINDINGS: Dialysis catheter is again seen. Cardiac shadow is mildly prominent but stable. Mild vascular congestion is again  seen likely related to volume overload. No focal infiltrate is noted. No acute bony abnormality is seen. IMPRESSION: Mild vascular congestion. Electronically Signed   By: Inez Catalina M.D.   On: 08/25/2017 18:27   Echocardiogram:                                              PENDING  IMPRESSION AND PLAN  Mr. Blake Uher Sr. is a 67 y.o. male with PMH of multiple CVAs, CHF, DM, HTN, and ESRD on HD presents with large left MCA stroke resulting in global aphasia and right hemiplegia with neglect.    CT head showed left large MCA infarct.  CTA head and neck showed left M2 occlusion.  TTE 60-65% in 04/2017.  LDL 94 and A1c 6.6. Outside the window for any intervention including TPA and thrombectomy. Patient stroke could be due to large vessel occlusion versus cardioembolic.  History of strokes - small vessel disease  12/2013 - left BG/CR infarct - A1c 9.9, LDL 145 - put on aspirin and Lipitor  11/2014 - left putamen/CR infarct - carotid Doppler negative EF 60-65% - A1c 7.1 and LDL 169 - aspirin switched to Plavix - continue Lipitor 80  02/2017 -bilateral MCA/ACA, left MCA/PCA watershed punctate infarct as well as left splenium punctate infarcts, likely due to hypotension during dialysis in the setting of intracranial atherosclerosis and small vessel disease.  CTA head and neck showed moderate proximal right M1 stenosis.  EF 65-70%.  LDL 50 and A1c 6.9.  Continue on dual antiplatelet on discharge.  Continue on Lipitor 80.  03/2017 - patient developed transient speech difficulties in the setting of hypotension and prolonged dialysis and MRI scan shows a tiny right parietal white matter infarct which is likely from small vessel  disease. Recommend avoid hypotension and dehydration during dialysis in the future and continuing aspirin and aggressive risk factor modification.   Currently, patient had significant neuro deficit with left MCA syndrome including left gaze, right neglect, right hemiplegia, global aphasia.  Patient also has dysphasia, difficulty handling secretions, risk for aspiration and respiratory distress. Poor prognosis.  Primary team has talked with family and they requested comfort care measure. I also had long discussion with wife at bedside today, updated pt current condition, treatment plan and poor prognosis. She expressed appreciation.   Neurology will sign off. Please call with questions. Thanks for the consult.   Rosalin Hawking, MD PhD Stroke Neurology 08/27/2017 11:54 AM   To contact Stroke Provider, please refer to [email or web address] After hours, contact General Neurology

## 2017-08-28 DIAGNOSIS — H518 Other specified disorders of binocular movement: Secondary | ICD-10-CM

## 2017-08-28 NOTE — Progress Notes (Signed)
Patient was discharged to [facility] by MD order; discharged instructions  review and sent to facility via [facility]; IV DIC; facility was called and report was given to the nurse who is going to receive the patient; family at bedside. Patient will be transported to facility via [facility].

## 2017-08-28 NOTE — Progress Notes (Signed)
Patient will DC to: [facility] Anticipated DC date: 08/28/17 Family notified: Son Transport by: Corey Harold   Per MD patient ready for DC to Hospice. RN, patient, patient's family, and facility notified of DC. Discharge Summary sent to facility. RN given number for report. DC packet on chart. Ambulance transport requested for patient.   CSW signing off.  Cedric Fishman, LCSW Clinical Social Worker [phone]

## 2017-08-28 NOTE — Progress Notes (Addendum)
   Subjective:  Patient seen and examined. He is accompanied by his wife this morning. Patient's wife had no questions this morning. He is awake, but still neglecting his right side with left gaze preference.   Objective:  Vital signs in last 24 hours: Vitals:   08/26/17 2000 08/26/17 2053 08/27/17 1309 08/27/17 2211  BP: (!) 163/86 (!) 165/86 133/80 (!) 98/50  Pulse: 87 87 94 88  Resp:  (!) 22 (!) 24 18  Temp:  100.3 F (37.9 C) 98.1 F (36.7 C) 99 F (37.2 C)  TempSrc:  Axillary Oral Oral  SpO2: 96% 96% 100% 94%  Weight:  285 lb 15 oz (129.7 kg)     General: Laying in bed, NAD HEENT: [place]/AT, leftward gaze deviation, no scleral icterus, pinpoint pupils Cardiac: RRR, No R/M/G appreciated Pulm: normal effort, CTAB in anterior lung fields Abd: soft, non tender, non distended, BS normal Ext: extremities well perfused, no peripheral edema Neuro: Eye opening to verbal command; GCS 6-7; right hemiplegia of upper and lower extremity; spontaneous movements of his left upper extremity   Assessment/Plan:  Active Problems:   HTN (hypertension), benign   Ischemic stroke (HCC)   Insulin dependent type 2 diabetes mellitus, controlled (HCC)   ESRD (end stage renal disease) on dialysis [facility])   CVA (cerebral vascular accident) ([place])   Goals of care Nonhemorrhagic CVA of left MCA ESRD  After discussion with [facility] PCP Dr. Jimmye Norman and family members, the patient has been transitioned to full comfort measures and will be discharged to residential hospice today.  -Comfort measures only  -Perm cath will not be removed as it will be an unnecessary procedure for the patient and the residential hospice center will accept him regardless. -Appreciate social works help with placement  -Daily vital signs  -No other aggressive measures  -Code Status confirmed: DNR/DNI     Dispo: Anticipated discharge today.  Melanee Spry, MD 08/28/2017, 11:01 AM Pager: [phone]

## 2017-08-28 NOTE — Progress Notes (Signed)
Nutrition Brief Note  Chart reviewed. Pt now transitioning to comfort care.  No further nutrition interventions warranted at this time.  Please re-consult as needed.   Nathanyal Ashmead M. Ambree Frances, MS, RD LDN Inpatient Clinical Dietitian Pager [phone]    

## 2017-08-30 LAB — CULTURE, BLOOD (ROUTINE X 2)
CULTURE: NO GROWTH
Special Requests: ADEQUATE

## 2017-08-31 LAB — CULTURE, BLOOD (ROUTINE X 2)
Culture: NO GROWTH
Special Requests: ADEQUATE

## 2017-09-15 DEATH — deceased

## 2018-03-11 IMAGING — MR MR HEAD W/O CM
9 of 10 series · 35 of 48 positions shown · non-contrast
Comparison: Prior CTA from earlier same day as well as previous MRI
from 12/03/2014.

CLINICAL DATA: Initial evaluation for acute confusion, slurred
speech.

EXAM:
MRI HEAD WITHOUT CONTRAST
TECHNIQUE: Multiplanar, multiecho pulse sequences of the brain and surrounding
structures were obtained without intravenous contrast.

[Series 3: DWI · axial · 3.0mm · 1.02mm/px · z∈[-160,-23]mm · 8 of 100 slices shown (1 of 2)]
[im 1/100]
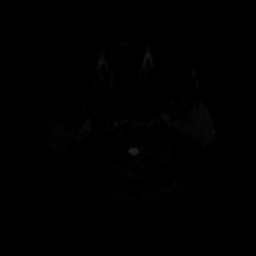
[im 12/100]
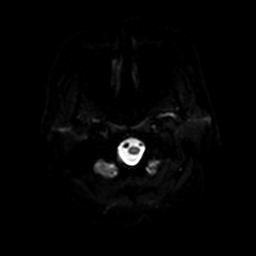
[im 34/100]
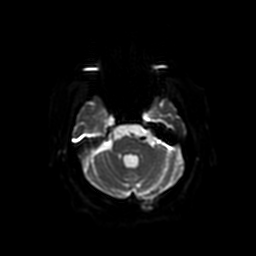
[im 45/100]
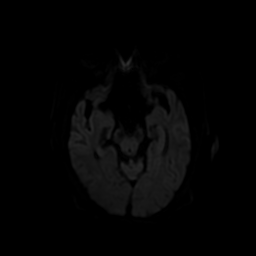
[im 56/100]
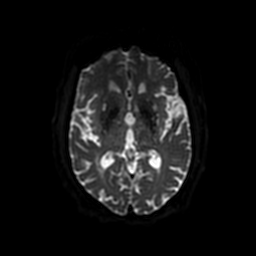
[im 67/100]
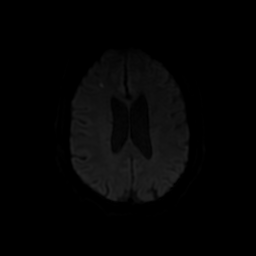
[im 89/100]
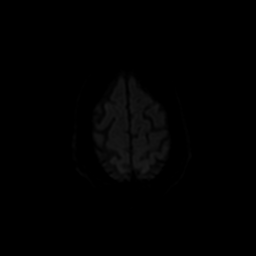
[im 100/100]
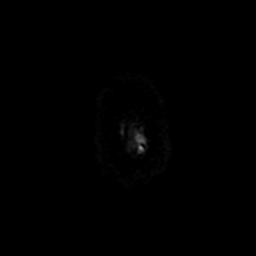

[Series 4: T2 · axial · 5.0mm · 0.43mm/px · z∈[-151,-17]mm · 2 of 25 slices shown (1 of 2)]
[im 1/25]
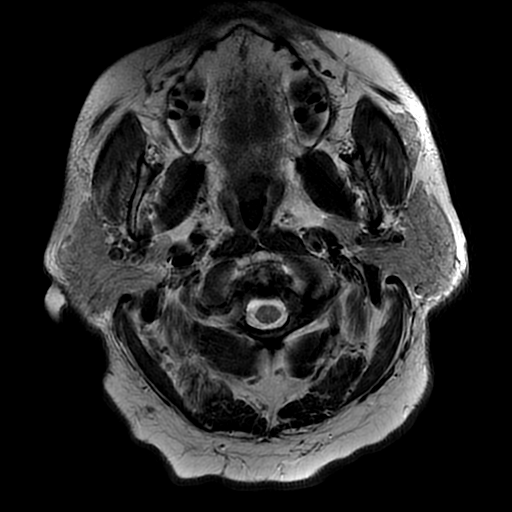
[im 25/25]
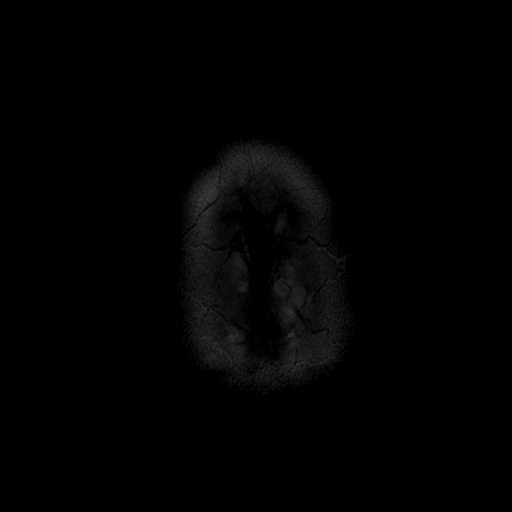

[Series 5: FLAIR · axial · 5.0mm · 0.43mm/px · z∈[-151,-17]mm · 2 of 25 slices shown (1 of 2)]
[im 1/25]
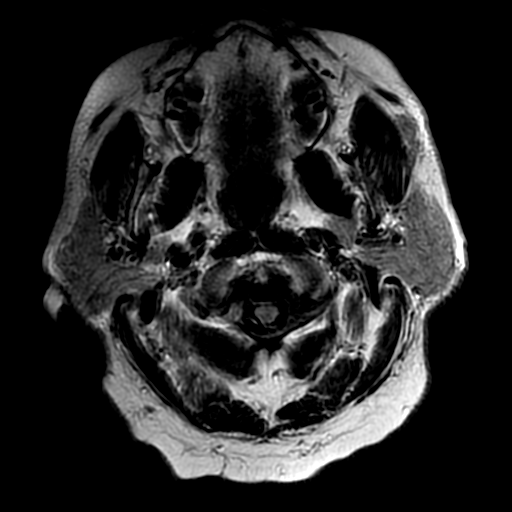
[im 25/25]
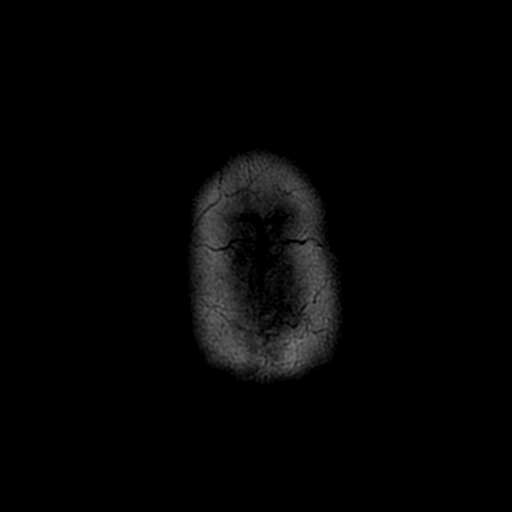

[Series 6: DWI · coronal · 5.0mm · 1.02mm/px · 6 of 66 slices shown (2 of 2)]
[im 1/66]
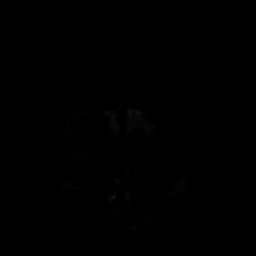
[im 14/66]
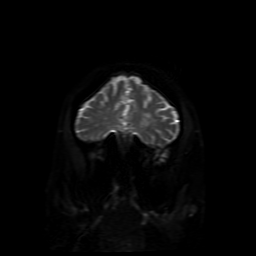
[im 27/66]
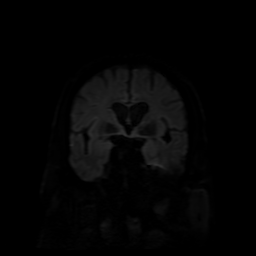
[im 40/66]
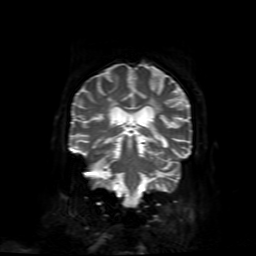
[im 53/66]
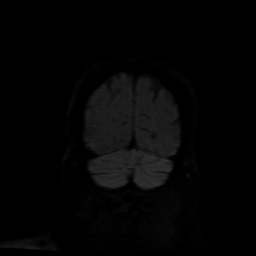
[im 66/66]
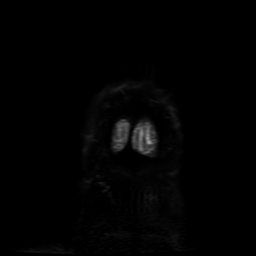

[Series 7: FLAIR · sagittal · 5.0mm · 0.47mm/px · 2 of 24 slices shown (2 of 2)]
[im 1/24]
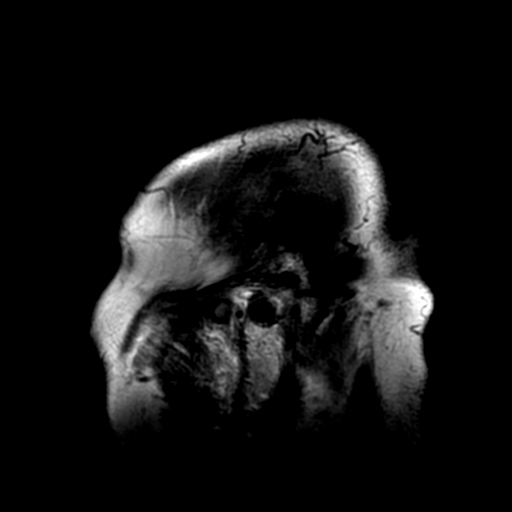
[im 24/24]
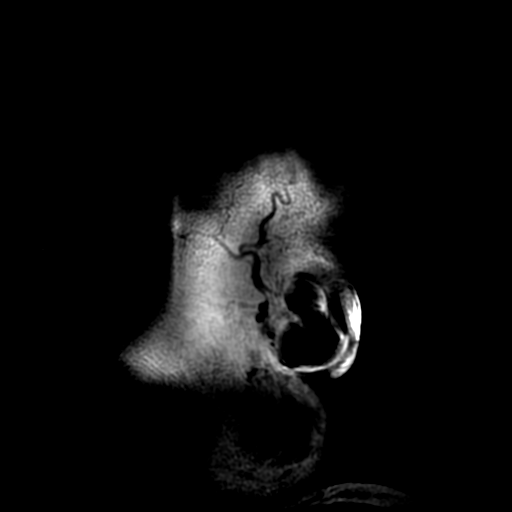

[Series 8: (person_name) · axial · 3.0mm · 0.47mm/px · z∈[-156,-95]mm · 4 of 100 slices shown]
[im 1/100]
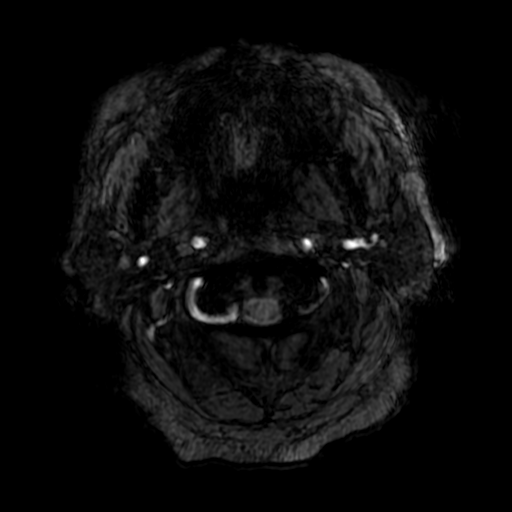
[im 12/100]
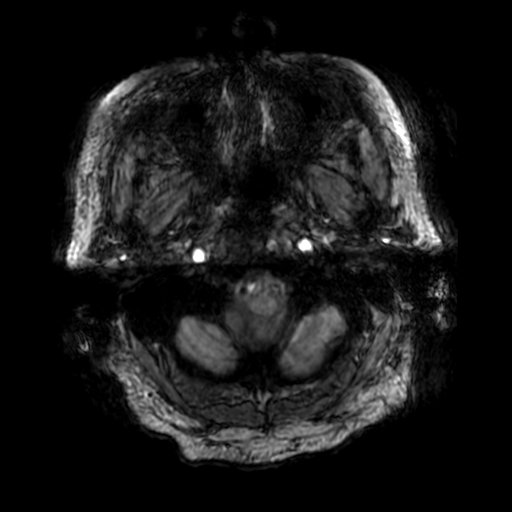
[im 34/100]
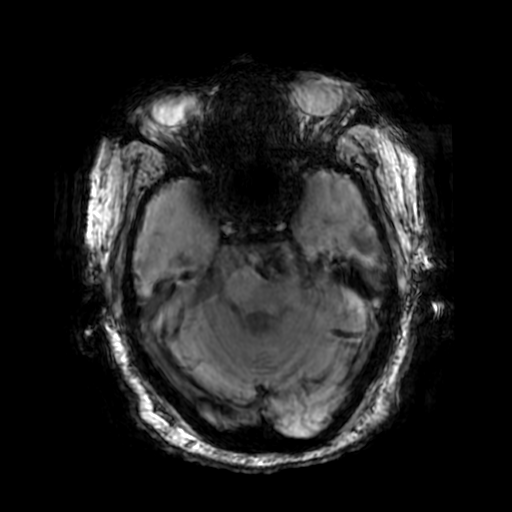
[im 45/100]
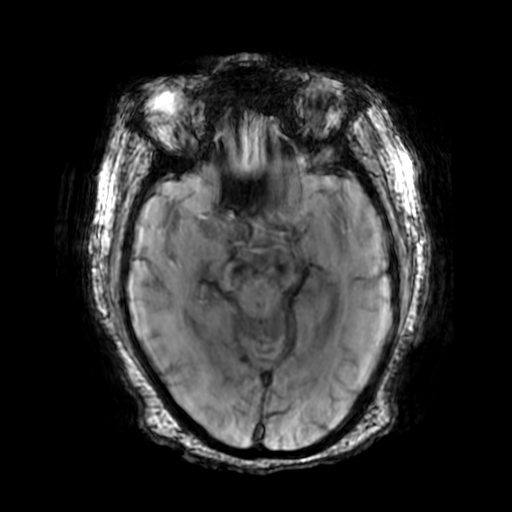

[Series 10: T2 · coronal · 5.0mm · 0.78mm/px · 3 of 33 slices shown (2 of 2)]
[im 1/33]
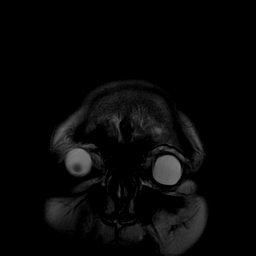
[im 17/33]
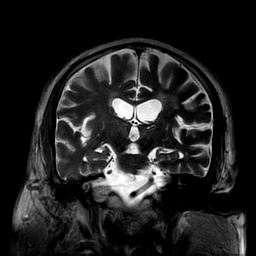
[im 33/33]
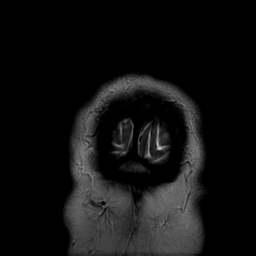

[Series 350: ADC · axial · 3.0mm · 1.02mm/px · z∈[-160,-23]mm · 5 of 50 slices shown (1 of 2)]
[im 1/50]
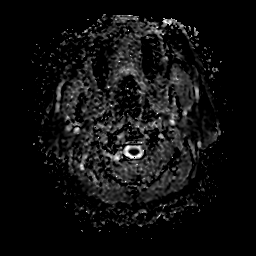
[im 13/50]
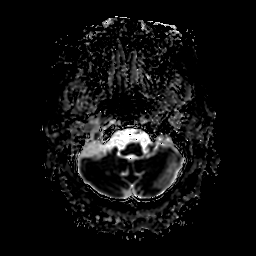
[im 25/50]
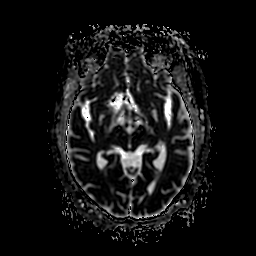
[im 37/50]
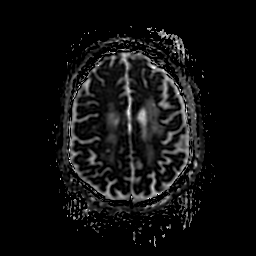
[im 50/50]
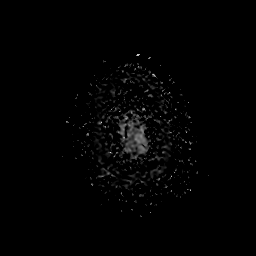

[Series 650: ADC · coronal · 5.0mm · 1.02mm/px · 3 of 33 slices shown (2 of 2)]
[im 1/33]
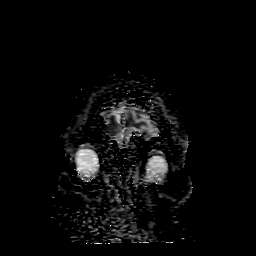
[im 17/33]
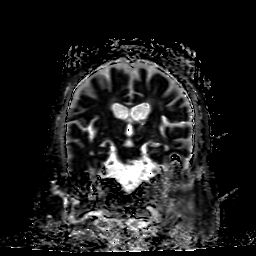
[im 33/33]
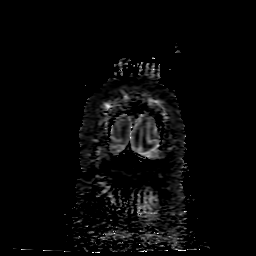

[35 of 48 positions shown; findings below may reference images not displayed]

FINDINGS: Brain: Study moderately degraded by motion artifact.

Diffuse prominence of the CSF containing spaces compatible with
generalized cerebral atrophy. Patchy and confluent T2/FLAIR
hyperintensity within the periventricular and deep white matter both
cerebral hemispheres most consistent with chronic microvascular
ischemic disease, moderate nature. Associated remote lacunar
infarcts present within the left basal ganglia/corona radiata out.
Remote hemorrhagic left thalamic lacunar infarct. Remote lacunar
infarcts within the pons. Probable few scattered tiny remote
bilateral cerebellar infarcts noted as well.

Punctate 4 mm left parietal cortical infarct (series 3, image 36). 8
mm left splenium all infarct (series 3, image 33) 5 mm subcortical
right frontal infarct (series 3, image 34). Few additional probable
late subacute subcentimeter infarcts noted at the bilateral centrum
semi ovale. No associated hemorrhage or mass effect.

No mass lesion, midline shift or mass effect. No hydrocephalus. No
extra-axial fluid collection. Major dural sinuses patent.

Vascular: Partially abnormal flow void within the hypoplastic left
vertebral artery, consistent with previously seen atheromatous
disease on prior CTA. Major intracranial vascular flow voids
otherwise maintained. Tortuous basilar artery invaginate ALBANI upon
the brainstem.

Skull and upper cervical spine: Craniocervical junction normal.
Multilevel degenerative spondylolysis noted within the upper
cervical spine without significant stenosis. Bone marrow signal
intensity normal. No scalp soft tissue abnormality.

Sinuses/Orbits: Globes normal soft tissues within normal limits.
Paranasal sinuses clear. Trace right mastoid effusion. Inner ear
structures normal.

Other: None.
IMPRESSION: 1. Three total subcentimeter acute ischemic nonhemorrhagic infarcts
involving the left parietal lobe, left splenium, and right frontal
lobe as above. Findings are likely embolic in nature.
2. Remote left thalamic, pontine, and bilateral cerebellar infarcts.
3. Moderate chronic microvascular ischemic disease.
